# Patient Record
Sex: Female | Born: 1935 | Race: White | Hispanic: No | Smoking: Former smoker
Health system: Southern US, Community
[De-identification: ages and names within clinical notes are randomized; demographics above are authoritative.]

## PROBLEM LIST (undated history)

## (undated) DIAGNOSIS — E785 Hyperlipidemia, unspecified: Secondary | ICD-10-CM

## (undated) DIAGNOSIS — Z87448 Personal history of other diseases of urinary system: Secondary | ICD-10-CM

## (undated) DIAGNOSIS — C3490 Malignant neoplasm of unspecified part of unspecified bronchus or lung: Secondary | ICD-10-CM

## (undated) DIAGNOSIS — Z86010 Personal history of colonic polyps: Secondary | ICD-10-CM

## (undated) DIAGNOSIS — J449 Chronic obstructive pulmonary disease, unspecified: Secondary | ICD-10-CM

## (undated) DIAGNOSIS — M1990 Unspecified osteoarthritis, unspecified site: Secondary | ICD-10-CM

## (undated) DIAGNOSIS — I10 Essential (primary) hypertension: Secondary | ICD-10-CM

## (undated) DIAGNOSIS — S62102A Fracture of unspecified carpal bone, left wrist, initial encounter for closed fracture: Secondary | ICD-10-CM

## (undated) DIAGNOSIS — E890 Postprocedural hypothyroidism: Secondary | ICD-10-CM

## (undated) DIAGNOSIS — Z923 Personal history of irradiation: Secondary | ICD-10-CM

## (undated) DIAGNOSIS — Z780 Asymptomatic menopausal state: Secondary | ICD-10-CM

## (undated) DIAGNOSIS — C50919 Malignant neoplasm of unspecified site of unspecified female breast: Secondary | ICD-10-CM

## (undated) DIAGNOSIS — M810 Age-related osteoporosis without current pathological fracture: Secondary | ICD-10-CM

## (undated) HISTORY — DX: Essential (primary) hypertension: I10

## (undated) HISTORY — DX: Hyperlipidemia, unspecified: E78.5

## (undated) HISTORY — DX: Personal history of colonic polyps: Z86.010

## (undated) HISTORY — DX: Personal history of irradiation: Z92.3

## (undated) HISTORY — DX: Unspecified osteoarthritis, unspecified site: M19.90

## (undated) HISTORY — DX: Age-related osteoporosis without current pathological fracture: M81.0

## (undated) HISTORY — DX: Fracture of unspecified carpal bone, left wrist, initial encounter for closed fracture: S62.102A

## (undated) HISTORY — DX: Postprocedural hypothyroidism: E89.0

## (undated) HISTORY — DX: Malignant neoplasm of unspecified site of unspecified female breast: C50.919

## (undated) HISTORY — DX: Malignant neoplasm of unspecified part of unspecified bronchus or lung: C34.90

## (undated) HISTORY — PX: BREAST SURGERY: SHX581

## (undated) HISTORY — DX: Chronic obstructive pulmonary disease, unspecified: J44.9

## (undated) HISTORY — PX: EYE SURGERY: SHX253

## (undated) HISTORY — DX: Asymptomatic menopausal state: Z78.0

## (undated) HISTORY — DX: Personal history of other diseases of urinary system: Z87.448

---
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
## 1989-08-03 DIAGNOSIS — C50919 Malignant neoplasm of unspecified site of unspecified female breast: Secondary | ICD-10-CM

## 1989-08-03 HISTORY — PX: MASTECTOMY: SHX3

## 1989-08-03 HISTORY — DX: Malignant neoplasm of unspecified site of unspecified female breast: C50.919

## 1998-04-12 ENCOUNTER — Ambulatory Visit (HOSPITAL_COMMUNITY): Admission: RE | Admit: 1998-04-12 | Discharge: 1998-04-12 | Payer: Self-pay | Admitting: *Deleted

## 1998-10-02 ENCOUNTER — Other Ambulatory Visit: Admission: RE | Admit: 1998-10-02 | Discharge: 1998-10-02 | Payer: Self-pay | Admitting: *Deleted

## 1998-10-22 ENCOUNTER — Other Ambulatory Visit: Admission: RE | Admit: 1998-10-22 | Discharge: 1998-10-22 | Payer: Self-pay | Admitting: *Deleted

## 1999-04-18 ENCOUNTER — Ambulatory Visit (HOSPITAL_COMMUNITY): Admission: RE | Admit: 1999-04-18 | Discharge: 1999-04-18 | Payer: Self-pay | Admitting: *Deleted

## 1999-04-21 ENCOUNTER — Encounter: Payer: Self-pay | Admitting: *Deleted

## 1999-10-23 ENCOUNTER — Other Ambulatory Visit: Admission: RE | Admit: 1999-10-23 | Discharge: 1999-10-23 | Payer: Self-pay | Admitting: *Deleted

## 2000-03-26 ENCOUNTER — Ambulatory Visit (HOSPITAL_COMMUNITY): Admission: RE | Admit: 2000-03-26 | Discharge: 2000-03-26 | Payer: Self-pay | Admitting: *Deleted

## 2000-03-26 ENCOUNTER — Encounter: Payer: Self-pay | Admitting: *Deleted

## 2000-10-26 ENCOUNTER — Other Ambulatory Visit: Admission: RE | Admit: 2000-10-26 | Discharge: 2000-10-26 | Payer: Self-pay | Admitting: *Deleted

## 2000-11-13 ENCOUNTER — Emergency Department (HOSPITAL_COMMUNITY): Admission: EM | Admit: 2000-11-13 | Discharge: 2000-11-13 | Payer: Self-pay | Admitting: *Deleted

## 2000-11-13 ENCOUNTER — Encounter: Payer: Self-pay | Admitting: General Surgery

## 2000-11-13 ENCOUNTER — Encounter: Payer: Self-pay | Admitting: *Deleted

## 2000-12-03 ENCOUNTER — Other Ambulatory Visit: Admission: RE | Admit: 2000-12-03 | Discharge: 2000-12-03 | Payer: Self-pay | Admitting: Gastroenterology

## 2000-12-03 ENCOUNTER — Encounter (INDEPENDENT_AMBULATORY_CARE_PROVIDER_SITE_OTHER): Payer: Self-pay | Admitting: Specialist

## 2000-12-03 LAB — HM COLONOSCOPY

## 2001-04-01 ENCOUNTER — Ambulatory Visit (HOSPITAL_COMMUNITY): Admission: RE | Admit: 2001-04-01 | Discharge: 2001-04-01 | Payer: Self-pay | Admitting: *Deleted

## 2001-04-01 ENCOUNTER — Encounter: Payer: Self-pay | Admitting: *Deleted

## 2002-04-07 ENCOUNTER — Ambulatory Visit (HOSPITAL_COMMUNITY): Admission: RE | Admit: 2002-04-07 | Discharge: 2002-04-07 | Payer: Self-pay | Admitting: *Deleted

## 2002-04-07 ENCOUNTER — Encounter: Payer: Self-pay | Admitting: *Deleted

## 2003-04-13 ENCOUNTER — Ambulatory Visit (HOSPITAL_COMMUNITY): Admission: RE | Admit: 2003-04-13 | Discharge: 2003-04-13 | Payer: Self-pay | Admitting: Endocrinology

## 2003-04-13 ENCOUNTER — Encounter: Payer: Self-pay | Admitting: Endocrinology

## 2003-05-14 ENCOUNTER — Encounter: Admission: RE | Admit: 2003-05-14 | Discharge: 2003-05-14 | Payer: Self-pay | Admitting: Endocrinology

## 2003-05-14 ENCOUNTER — Encounter: Payer: Self-pay | Admitting: Endocrinology

## 2004-04-18 ENCOUNTER — Ambulatory Visit (HOSPITAL_COMMUNITY): Admission: RE | Admit: 2004-04-18 | Discharge: 2004-04-18 | Payer: Self-pay | Admitting: Endocrinology

## 2005-05-05 ENCOUNTER — Ambulatory Visit: Payer: Self-pay | Admitting: Endocrinology

## 2005-05-14 ENCOUNTER — Ambulatory Visit: Payer: Self-pay | Admitting: Endocrinology

## 2005-05-15 ENCOUNTER — Ambulatory Visit (HOSPITAL_COMMUNITY): Admission: RE | Admit: 2005-05-15 | Discharge: 2005-05-15 | Payer: Self-pay | Admitting: Endocrinology

## 2005-05-21 ENCOUNTER — Ambulatory Visit: Payer: Self-pay | Admitting: Endocrinology

## 2005-05-28 ENCOUNTER — Ambulatory Visit: Payer: Self-pay | Admitting: Endocrinology

## 2006-05-10 ENCOUNTER — Ambulatory Visit: Payer: Self-pay | Admitting: Endocrinology

## 2006-05-21 ENCOUNTER — Ambulatory Visit (HOSPITAL_COMMUNITY): Admission: RE | Admit: 2006-05-21 | Discharge: 2006-05-21 | Payer: Self-pay | Admitting: Endocrinology

## 2007-04-16 ENCOUNTER — Encounter: Payer: Self-pay | Admitting: Endocrinology

## 2007-04-16 DIAGNOSIS — I10 Essential (primary) hypertension: Secondary | ICD-10-CM | POA: Insufficient documentation

## 2007-04-16 DIAGNOSIS — Z860100 Personal history of colon polyps, unspecified: Secondary | ICD-10-CM | POA: Insufficient documentation

## 2007-04-16 DIAGNOSIS — Z87448 Personal history of other diseases of urinary system: Secondary | ICD-10-CM

## 2007-04-16 DIAGNOSIS — Z853 Personal history of malignant neoplasm of breast: Secondary | ICD-10-CM | POA: Insufficient documentation

## 2007-04-16 DIAGNOSIS — E785 Hyperlipidemia, unspecified: Secondary | ICD-10-CM

## 2007-04-16 DIAGNOSIS — E1169 Type 2 diabetes mellitus with other specified complication: Secondary | ICD-10-CM | POA: Insufficient documentation

## 2007-04-16 DIAGNOSIS — E782 Mixed hyperlipidemia: Secondary | ICD-10-CM | POA: Insufficient documentation

## 2007-04-16 HISTORY — DX: Essential (primary) hypertension: I10

## 2007-04-16 HISTORY — DX: Personal history of other diseases of urinary system: Z87.448

## 2007-04-16 HISTORY — DX: Hyperlipidemia, unspecified: E78.5

## 2007-04-16 HISTORY — DX: Personal history of colon polyps, unspecified: Z86.0100

## 2007-04-16 HISTORY — DX: Personal history of colonic polyps: Z86.010

## 2007-05-12 ENCOUNTER — Ambulatory Visit: Payer: Self-pay | Admitting: Endocrinology

## 2007-05-24 ENCOUNTER — Ambulatory Visit (HOSPITAL_COMMUNITY): Admission: RE | Admit: 2007-05-24 | Discharge: 2007-05-24 | Payer: Self-pay | Admitting: Endocrinology

## 2007-06-06 ENCOUNTER — Ambulatory Visit: Payer: Self-pay | Admitting: Family Medicine

## 2007-06-21 ENCOUNTER — Ambulatory Visit: Payer: Self-pay | Admitting: Endocrinology

## 2007-06-21 LAB — CONVERTED CEMR LAB
ALT: 16 units/L (ref 0–35)
AST: 20 units/L (ref 0–37)
Albumin: 4.2 g/dL (ref 3.5–5.2)
Alkaline Phosphatase: 33 units/L — ABNORMAL LOW (ref 39–117)
BUN: 20 mg/dL (ref 6–23)
Bacteria, UA: NEGATIVE
Basophils Absolute: 0.1 10*3/uL (ref 0.0–0.1)
Basophils Relative: 0.8 % (ref 0.0–1.0)
Bilirubin Urine: NEGATIVE
Bilirubin, Direct: 0.1 mg/dL (ref 0.0–0.3)
CO2: 31 meq/L (ref 19–32)
Calcium: 9.7 mg/dL (ref 8.4–10.5)
Chloride: 103 meq/L (ref 96–112)
Cholesterol: 149 mg/dL (ref 0–200)
Creatinine, Ser: 0.7 mg/dL (ref 0.4–1.2)
Crystals: NEGATIVE
Eosinophils Absolute: 0.1 10*3/uL (ref 0.0–0.6)
Eosinophils Relative: 1.7 % (ref 0.0–5.0)
GFR calc Af Amer: 105 mL/min
GFR calc non Af Amer: 87 mL/min
Glucose, Bld: 96 mg/dL (ref 70–99)
HCT: 38.8 % (ref 36.0–46.0)
HDL: 66.4 mg/dL (ref >39.0)
Hemoglobin: 13.5 g/dL (ref 12.0–15.0)
Hgb A1c MFr Bld: 6.1 % — ABNORMAL HIGH (ref 4.6–6.0)
Ketones, ur: NEGATIVE mg/dL
LDL Cholesterol: 65 mg/dL (ref 0–99)
Leukocytes, UA: NEGATIVE
Lymphocytes Relative: 25.2 % (ref 12.0–46.0)
MCHC: 34.7 g/dL (ref 30.0–36.0)
MCV: 94.8 fL (ref 78.0–100.0)
Monocytes Absolute: 0.8 10*3/uL — ABNORMAL HIGH (ref 0.2–0.7)
Monocytes Relative: 9.2 % (ref 3.0–11.0)
Mucus, UA: NEGATIVE
Neutro Abs: 5.4 10*3/uL (ref 1.4–7.7)
Neutrophils Relative %: 63.1 % (ref 43.0–77.0)
Nitrite: NEGATIVE
Platelets: 235 10*3/uL (ref 150–400)
Potassium: 4.5 meq/L (ref 3.5–5.1)
RBC: 4.09 M/uL (ref 3.87–5.11)
RDW: 12.3 % (ref 11.5–14.6)
Sodium: 144 meq/L (ref 135–145)
Specific Gravity, Urine: 1.02 (ref 1.000–1.03)
TSH: 1.08 microintl units/mL (ref 0.35–5.50)
Total Bilirubin: 0.6 mg/dL (ref 0.3–1.2)
Total CHOL/HDL Ratio: 2.2
Total Protein, Urine: NEGATIVE mg/dL
Total Protein: 7.2 g/dL (ref 6.0–8.3)
Triglycerides: 90 mg/dL (ref 0–149)
Urine Glucose: NEGATIVE mg/dL
Urobilinogen, UA: 0.2 (ref 0.0–1.0)
VLDL: 18 mg/dL (ref 0–40)
WBC: 8.6 10*3/uL (ref 4.5–10.5)
pH: 6 (ref 5.0–8.0)

## 2008-01-02 ENCOUNTER — Encounter: Payer: Self-pay | Admitting: Endocrinology

## 2008-04-18 ENCOUNTER — Ambulatory Visit (HOSPITAL_COMMUNITY): Admission: RE | Admit: 2008-04-18 | Discharge: 2008-04-18 | Payer: Self-pay | Admitting: Orthopaedic Surgery

## 2008-04-18 ENCOUNTER — Encounter (INDEPENDENT_AMBULATORY_CARE_PROVIDER_SITE_OTHER): Payer: Self-pay | Admitting: Interventional Radiology

## 2008-05-09 ENCOUNTER — Ambulatory Visit: Payer: Self-pay | Admitting: Thoracic Surgery

## 2008-05-15 ENCOUNTER — Ambulatory Visit: Payer: Self-pay | Admitting: Internal Medicine

## 2008-05-16 ENCOUNTER — Ambulatory Visit (HOSPITAL_COMMUNITY): Admission: RE | Admit: 2008-05-16 | Discharge: 2008-05-16 | Payer: Self-pay | Admitting: Thoracic Surgery

## 2008-05-17 ENCOUNTER — Ambulatory Visit: Payer: Self-pay | Admitting: Thoracic Surgery

## 2008-05-17 ENCOUNTER — Ambulatory Visit: Payer: Self-pay | Admitting: Emergency Medicine

## 2008-05-17 DIAGNOSIS — J449 Chronic obstructive pulmonary disease, unspecified: Secondary | ICD-10-CM

## 2008-05-17 DIAGNOSIS — J4489 Other specified chronic obstructive pulmonary disease: Secondary | ICD-10-CM

## 2008-05-17 HISTORY — DX: Other specified chronic obstructive pulmonary disease: J44.89

## 2008-05-17 HISTORY — DX: Chronic obstructive pulmonary disease, unspecified: J44.9

## 2008-06-07 ENCOUNTER — Ambulatory Visit: Payer: Self-pay | Admitting: Endocrinology

## 2008-06-07 DIAGNOSIS — E890 Postprocedural hypothyroidism: Secondary | ICD-10-CM

## 2008-06-07 HISTORY — DX: Postprocedural hypothyroidism: E89.0

## 2008-06-08 ENCOUNTER — Ambulatory Visit (HOSPITAL_COMMUNITY): Admission: RE | Admit: 2008-06-08 | Discharge: 2008-06-08 | Payer: Self-pay | Admitting: Endocrinology

## 2008-06-08 LAB — HM MAMMOGRAPHY: HM Mammogram: NEGATIVE

## 2008-06-10 LAB — CONVERTED CEMR LAB
ALT: 15 units/L (ref 0–35)
AST: 23 units/L (ref 0–37)
Albumin: 4.1 g/dL (ref 3.5–5.2)
Alkaline Phosphatase: 39 units/L (ref 39–117)
BUN: 13 mg/dL (ref 6–23)
Bacteria, UA: NEGATIVE
Basophils Absolute: 0 10*3/uL (ref 0.0–0.1)
Basophils Relative: 0.6 % (ref 0.0–3.0)
Bilirubin Urine: NEGATIVE
Bilirubin, Direct: 0.1 mg/dL (ref 0.0–0.3)
CO2: 32 meq/L (ref 19–32)
Calcium: 9.9 mg/dL (ref 8.4–10.5)
Chloride: 104 meq/L (ref 96–112)
Cholesterol: 134 mg/dL (ref 0–200)
Creatinine, Ser: 0.7 mg/dL (ref 0.4–1.2)
Creatinine,U: 29.4 mg/dL
Crystals: NEGATIVE
Eosinophils Absolute: 0.1 10*3/uL (ref 0.0–0.7)
Eosinophils Relative: 1.8 % (ref 0.0–5.0)
GFR calc Af Amer: 105 mL/min
GFR calc non Af Amer: 87 mL/min
Glucose, Bld: 87 mg/dL (ref 70–99)
HCT: 35.2 % — ABNORMAL LOW (ref 36.0–46.0)
HDL: 67.8 mg/dL (ref >39.0)
Hemoglobin, Urine: NEGATIVE
Hemoglobin: 12 g/dL (ref 12.0–15.0)
Hgb A1c MFr Bld: 6.1 % — ABNORMAL HIGH (ref 4.6–6.0)
Ketones, ur: NEGATIVE mg/dL
LDL Cholesterol: 47 mg/dL (ref 0–99)
Leukocytes, UA: NEGATIVE
Lymphocytes Relative: 31 % (ref 12.0–46.0)
MCHC: 34 g/dL (ref 30.0–36.0)
MCV: 96.1 fL (ref 78.0–100.0)
Microalb Creat Ratio: 34 mg/g — ABNORMAL HIGH (ref 0.0–30.0)
Microalb, Ur: 1 mg/dL (ref 0.0–1.9)
Monocytes Absolute: 0.7 10*3/uL (ref 0.1–1.0)
Monocytes Relative: 9.4 % (ref 3.0–12.0)
Mucus, UA: NEGATIVE
Neutro Abs: 4.6 10*3/uL (ref 1.4–7.7)
Neutrophils Relative %: 57.2 % (ref 43.0–77.0)
Nitrite: NEGATIVE
Platelets: 215 10*3/uL (ref 150–400)
Potassium: 4.3 meq/L (ref 3.5–5.1)
RBC / HPF: NONE SEEN
RBC: 3.66 M/uL — ABNORMAL LOW (ref 3.87–5.11)
RDW: 13.8 % (ref 11.5–14.6)
Sodium: 143 meq/L (ref 135–145)
Specific Gravity, Urine: <=1.005 (ref 1.000–1.03)
TSH: 1.14 microintl units/mL (ref 0.35–5.50)
Total Bilirubin: 0.5 mg/dL (ref 0.3–1.2)
Total CHOL/HDL Ratio: 2
Total Protein, Urine: NEGATIVE mg/dL
Total Protein: 7.1 g/dL (ref 6.0–8.3)
Triglycerides: 98 mg/dL (ref 0–149)
Urine Glucose: NEGATIVE mg/dL
Urobilinogen, UA: 0.2 (ref 0.0–1.0)
VLDL: 20 mg/dL (ref 0–40)
WBC: 7.8 10*3/uL (ref 4.5–10.5)
pH: 6 (ref 5.0–8.0)

## 2008-07-24 ENCOUNTER — Ambulatory Visit: Payer: Self-pay | Admitting: Gastroenterology

## 2008-08-08 ENCOUNTER — Ambulatory Visit: Payer: Self-pay | Admitting: Gastroenterology

## 2008-08-08 ENCOUNTER — Encounter: Payer: Self-pay | Admitting: Gastroenterology

## 2008-08-08 LAB — HM COLONOSCOPY

## 2008-08-15 ENCOUNTER — Encounter: Admission: RE | Admit: 2008-08-15 | Discharge: 2008-08-15 | Payer: Self-pay | Admitting: Thoracic Surgery

## 2008-08-15 ENCOUNTER — Ambulatory Visit: Payer: Self-pay | Admitting: Thoracic Surgery

## 2008-08-15 ENCOUNTER — Encounter: Payer: Self-pay | Admitting: Gastroenterology

## 2008-08-22 ENCOUNTER — Encounter: Payer: Self-pay | Admitting: Endocrinology

## 2008-08-22 ENCOUNTER — Encounter: Admission: RE | Admit: 2008-08-22 | Discharge: 2008-08-22 | Payer: Self-pay | Admitting: Neurosurgery

## 2008-11-13 ENCOUNTER — Telehealth: Payer: Self-pay | Admitting: Endocrinology

## 2008-12-11 ENCOUNTER — Ambulatory Visit: Payer: Self-pay | Admitting: Thoracic Surgery

## 2008-12-11 ENCOUNTER — Encounter: Admission: RE | Admit: 2008-12-11 | Discharge: 2008-12-11 | Payer: Self-pay | Admitting: Thoracic Surgery

## 2009-02-15 ENCOUNTER — Ambulatory Visit: Payer: Self-pay | Admitting: Internal Medicine

## 2009-02-15 DIAGNOSIS — R609 Edema, unspecified: Secondary | ICD-10-CM | POA: Insufficient documentation

## 2009-02-18 ENCOUNTER — Ambulatory Visit: Payer: Self-pay | Admitting: Internal Medicine

## 2009-02-18 LAB — CONVERTED CEMR LAB
ALT: 14 units/L (ref 0–35)
AST: 20 units/L (ref 0–37)
Albumin: 4.1 g/dL (ref 3.5–5.2)
Alkaline Phosphatase: 37 units/L — ABNORMAL LOW (ref 39–117)
BUN: 17 mg/dL (ref 6–23)
Basophils Absolute: 0 10*3/uL (ref 0.0–0.1)
Basophils Relative: 0.4 % (ref 0.0–3.0)
Bilirubin Urine: NEGATIVE
Bilirubin, Direct: 0.1 mg/dL (ref 0.0–0.3)
CO2: 33 meq/L — ABNORMAL HIGH (ref 19–32)
Calcium: 10 mg/dL (ref 8.4–10.5)
Chloride: 103 meq/L (ref 96–112)
Creatinine, Ser: 0.8 mg/dL (ref 0.4–1.2)
Eosinophils Absolute: 0.1 10*3/uL (ref 0.0–0.7)
Eosinophils Relative: 1.8 % (ref 0.0–5.0)
GFR calc non Af Amer: 74.32 mL/min (ref >60)
Glucose, Bld: 99 mg/dL (ref 70–99)
HCT: 37.5 % (ref 36.0–46.0)
Hemoglobin: 12.6 g/dL (ref 12.0–15.0)
Ketones, ur: NEGATIVE mg/dL
Leukocytes, UA: NEGATIVE
Lymphocytes Relative: 30.8 % (ref 12.0–46.0)
Lymphs Abs: 2.2 10*3/uL (ref 0.7–4.0)
MCHC: 33.6 g/dL (ref 30.0–36.0)
MCV: 95.3 fL (ref 78.0–100.0)
Monocytes Absolute: 0.7 10*3/uL (ref 0.1–1.0)
Monocytes Relative: 10.3 % (ref 3.0–12.0)
Neutro Abs: 4 10*3/uL (ref 1.4–7.7)
Neutrophils Relative %: 56.7 % (ref 43.0–77.0)
Nitrite: NEGATIVE
Platelets: 210 10*3/uL (ref 150.0–400.0)
Potassium: 4.4 meq/L (ref 3.5–5.1)
RBC: 3.93 M/uL (ref 3.87–5.11)
RDW: 12.8 % (ref 11.5–14.6)
Sodium: 142 meq/L (ref 135–145)
Specific Gravity, Urine: 1.015 (ref 1.000–1.030)
TSH: 0.94 microintl units/mL (ref 0.35–5.50)
Total Bilirubin: 0.7 mg/dL (ref 0.3–1.2)
Total Protein, Urine: NEGATIVE mg/dL
Total Protein: 7.5 g/dL (ref 6.0–8.3)
Urine Glucose: NEGATIVE mg/dL
Urobilinogen, UA: 0.2 (ref 0.0–1.0)
WBC: 7 10*3/uL (ref 4.5–10.5)
pH: 5 (ref 5.0–8.0)

## 2009-05-01 ENCOUNTER — Ambulatory Visit: Payer: Self-pay | Admitting: Thoracic Surgery

## 2009-05-01 ENCOUNTER — Encounter: Admission: RE | Admit: 2009-05-01 | Discharge: 2009-05-01 | Payer: Self-pay | Admitting: Thoracic Surgery

## 2009-05-01 ENCOUNTER — Encounter: Payer: Self-pay | Admitting: Endocrinology

## 2009-05-02 ENCOUNTER — Ambulatory Visit: Admission: RE | Admit: 2009-05-02 | Discharge: 2009-05-02 | Payer: Self-pay | Admitting: Thoracic Surgery

## 2009-05-22 ENCOUNTER — Encounter (INDEPENDENT_AMBULATORY_CARE_PROVIDER_SITE_OTHER): Payer: Self-pay | Admitting: Interventional Radiology

## 2009-05-22 ENCOUNTER — Ambulatory Visit (HOSPITAL_COMMUNITY): Admission: RE | Admit: 2009-05-22 | Discharge: 2009-05-22 | Payer: Self-pay | Admitting: Thoracic Surgery

## 2009-05-22 HISTORY — PX: LUNG BIOPSY: SHX232

## 2009-06-14 ENCOUNTER — Ambulatory Visit (HOSPITAL_COMMUNITY): Admission: RE | Admit: 2009-06-14 | Discharge: 2009-06-14 | Payer: Self-pay | Admitting: Endocrinology

## 2009-06-14 LAB — HM MAMMOGRAPHY: HM Mammogram: NEGATIVE

## 2009-06-20 ENCOUNTER — Ambulatory Visit: Payer: Self-pay | Admitting: Thoracic Surgery

## 2009-07-02 ENCOUNTER — Ambulatory Visit: Payer: Self-pay | Admitting: Endocrinology

## 2009-07-02 DIAGNOSIS — Z780 Asymptomatic menopausal state: Secondary | ICD-10-CM | POA: Insufficient documentation

## 2009-07-02 DIAGNOSIS — M810 Age-related osteoporosis without current pathological fracture: Secondary | ICD-10-CM | POA: Insufficient documentation

## 2009-07-02 HISTORY — DX: Asymptomatic menopausal state: Z78.0

## 2009-07-02 HISTORY — DX: Age-related osteoporosis without current pathological fracture: M81.0

## 2009-07-02 LAB — CONVERTED CEMR LAB
Creatinine,U: 129.9 mg/dL
Hgb A1c MFr Bld: 6.1 % (ref 4.6–6.5)
Microalb Creat Ratio: 13.1 mg/g (ref 0.0–30.0)
Microalb, Ur: 1.7 mg/dL (ref 0.0–1.9)

## 2009-07-03 ENCOUNTER — Ambulatory Visit: Payer: Self-pay | Admitting: Endocrinology

## 2009-07-05 ENCOUNTER — Ambulatory Visit (HOSPITAL_COMMUNITY): Admission: RE | Admit: 2009-07-05 | Discharge: 2009-07-05 | Payer: Self-pay | Admitting: Radiation Oncology

## 2009-07-10 ENCOUNTER — Ambulatory Visit: Admission: RE | Admit: 2009-07-10 | Discharge: 2009-08-02 | Payer: Self-pay | Admitting: Radiation Oncology

## 2009-08-04 ENCOUNTER — Ambulatory Visit: Admission: RE | Admit: 2009-08-04 | Discharge: 2009-08-16 | Payer: Self-pay | Admitting: Radiation Oncology

## 2009-10-30 ENCOUNTER — Ambulatory Visit: Payer: Self-pay | Admitting: Internal Medicine

## 2009-11-01 ENCOUNTER — Ambulatory Visit: Admission: RE | Admit: 2009-11-01 | Discharge: 2009-11-01 | Payer: Self-pay | Admitting: Radiation Oncology

## 2009-11-01 LAB — CREATININE, SERUM: Creatinine, Ser: 0.93 mg/dL (ref 0.40–1.20)

## 2009-11-01 LAB — BUN: BUN: 19 mg/dL (ref 6–23)

## 2009-11-11 ENCOUNTER — Ambulatory Visit (HOSPITAL_COMMUNITY): Admission: RE | Admit: 2009-11-11 | Discharge: 2009-11-11 | Payer: Self-pay | Admitting: Radiation Oncology

## 2009-11-13 ENCOUNTER — Encounter: Payer: Self-pay | Admitting: Endocrinology

## 2009-11-14 ENCOUNTER — Telehealth (INDEPENDENT_AMBULATORY_CARE_PROVIDER_SITE_OTHER): Payer: Self-pay | Admitting: *Deleted

## 2010-02-06 ENCOUNTER — Ambulatory Visit: Payer: Self-pay | Admitting: Internal Medicine

## 2010-03-24 ENCOUNTER — Ambulatory Visit: Admission: RE | Admit: 2010-03-24 | Discharge: 2010-03-24 | Payer: Self-pay | Admitting: Radiation Oncology

## 2010-03-24 ENCOUNTER — Ambulatory Visit: Payer: Self-pay | Admitting: Internal Medicine

## 2010-03-24 LAB — CREATININE, SERUM: Creatinine, Ser: 0.87 mg/dL (ref 0.40–1.20)

## 2010-03-24 LAB — BUN: BUN: 18 mg/dL (ref 6–23)

## 2010-03-26 ENCOUNTER — Ambulatory Visit (HOSPITAL_COMMUNITY): Admission: RE | Admit: 2010-03-26 | Discharge: 2010-03-26 | Payer: Self-pay | Admitting: Radiation Oncology

## 2010-03-26 ENCOUNTER — Encounter: Payer: Self-pay | Admitting: Endocrinology

## 2010-06-20 ENCOUNTER — Ambulatory Visit (HOSPITAL_COMMUNITY)
Admission: RE | Admit: 2010-06-20 | Discharge: 2010-06-20 | Payer: Self-pay | Source: Home / Self Care | Admitting: Endocrinology

## 2010-06-20 LAB — HM MAMMOGRAPHY

## 2010-07-03 ENCOUNTER — Ambulatory Visit: Payer: Self-pay | Admitting: Endocrinology

## 2010-07-03 ENCOUNTER — Encounter: Payer: Self-pay | Admitting: Endocrinology

## 2010-07-03 DIAGNOSIS — Z87891 Personal history of nicotine dependence: Secondary | ICD-10-CM | POA: Insufficient documentation

## 2010-07-03 DIAGNOSIS — C3490 Malignant neoplasm of unspecified part of unspecified bronchus or lung: Secondary | ICD-10-CM

## 2010-07-03 HISTORY — DX: Malignant neoplasm of unspecified part of unspecified bronchus or lung: C34.90

## 2010-07-03 LAB — CONVERTED CEMR LAB
ALT: 11 units/L (ref 0–35)
AST: 19 units/L (ref 0–37)
Albumin: 4.2 g/dL (ref 3.5–5.2)
Alkaline Phosphatase: 53 units/L (ref 39–117)
BUN: 20 mg/dL (ref 6–23)
Basophils Absolute: 0 10*3/uL (ref 0.0–0.1)
Basophils Relative: 0.3 % (ref 0.0–3.0)
Bilirubin Urine: NEGATIVE
Bilirubin, Direct: 0.1 mg/dL (ref 0.0–0.3)
CO2: 29 meq/L (ref 19–32)
Calcium, Total (PTH): 9.7 mg/dL (ref 8.4–10.5)
Calcium: 9.6 mg/dL (ref 8.4–10.5)
Chloride: 100 meq/L (ref 96–112)
Cholesterol: 268 mg/dL — ABNORMAL HIGH (ref 0–200)
Creatinine, Ser: 0.8 mg/dL (ref 0.4–1.2)
Creatinine,U: 147.2 mg/dL
Direct LDL: 179.2 mg/dL
Eosinophils Absolute: 0.1 10*3/uL (ref 0.0–0.7)
Eosinophils Relative: 1.6 % (ref 0.0–5.0)
GFR calc non Af Amer: 72.99 mL/min (ref >60)
Glucose, Bld: 96 mg/dL (ref 70–99)
HCT: 38 % (ref 36.0–46.0)
HDL: 62.1 mg/dL (ref >39.00)
Hemoglobin: 13.2 g/dL (ref 12.0–15.0)
Hgb A1c MFr Bld: 6 % (ref 4.6–6.5)
Ketones, ur: NEGATIVE mg/dL
Leukocytes, UA: NEGATIVE
Lymphocytes Relative: 23.3 % (ref 12.0–46.0)
Lymphs Abs: 1.4 10*3/uL (ref 0.7–4.0)
MCHC: 34.7 g/dL (ref 30.0–36.0)
MCV: 94.6 fL (ref 78.0–100.0)
Microalb Creat Ratio: 1.5 mg/g (ref 0.0–30.0)
Microalb, Ur: 2.2 mg/dL — ABNORMAL HIGH (ref 0.0–1.9)
Monocytes Absolute: 0.6 10*3/uL (ref 0.1–1.0)
Monocytes Relative: 9.7 % (ref 3.0–12.0)
Neutro Abs: 3.9 10*3/uL (ref 1.4–7.7)
Neutrophils Relative %: 65.1 % (ref 43.0–77.0)
Nitrite: NEGATIVE
PTH: 62.5 pg/mL (ref 14.0–72.0)
Platelets: 218 10*3/uL (ref 150.0–400.0)
Potassium: 4.1 meq/L (ref 3.5–5.1)
RBC: 4.02 M/uL (ref 3.87–5.11)
RDW: 14.5 % (ref 11.5–14.6)
Sodium: 139 meq/L (ref 135–145)
Specific Gravity, Urine: 1.025 (ref 1.000–1.030)
TSH: 1.37 microintl units/mL (ref 0.35–5.50)
Total Bilirubin: 0.3 mg/dL (ref 0.3–1.2)
Total CHOL/HDL Ratio: 4
Total Protein, Urine: NEGATIVE mg/dL
Total Protein: 7.3 g/dL (ref 6.0–8.3)
Triglycerides: 118 mg/dL (ref 0.0–149.0)
Urine Glucose: NEGATIVE mg/dL
Urobilinogen, UA: 0.2 (ref 0.0–1.0)
VLDL: 23.6 mg/dL (ref 0.0–40.0)
WBC: 5.9 10*3/uL (ref 4.5–10.5)
pH: 5.5 (ref 5.0–8.0)

## 2010-07-29 ENCOUNTER — Ambulatory Visit
Admission: RE | Admit: 2010-07-29 | Discharge: 2010-07-29 | Payer: Self-pay | Source: Home / Self Care | Attending: Radiation Oncology | Admitting: Radiation Oncology

## 2010-07-29 ENCOUNTER — Ambulatory Visit: Payer: Self-pay | Admitting: Internal Medicine

## 2010-07-30 ENCOUNTER — Encounter: Payer: Self-pay | Admitting: Endocrinology

## 2010-07-30 ENCOUNTER — Ambulatory Visit (HOSPITAL_COMMUNITY)
Admission: RE | Admit: 2010-07-30 | Discharge: 2010-07-30 | Payer: Self-pay | Source: Home / Self Care | Attending: Radiation Oncology | Admitting: Radiation Oncology

## 2010-08-01 ENCOUNTER — Encounter: Payer: Self-pay | Admitting: Endocrinology

## 2010-08-23 ENCOUNTER — Other Ambulatory Visit: Payer: Self-pay | Admitting: Radiation Oncology

## 2010-08-23 DIAGNOSIS — Z85118 Personal history of other malignant neoplasm of bronchus and lung: Secondary | ICD-10-CM

## 2010-08-24 ENCOUNTER — Encounter: Payer: Self-pay | Admitting: Radiation Oncology

## 2010-09-02 NOTE — Assessment & Plan Note (Signed)
Summary: lung nodule   Visit Type:  Initial Consult  Chief Complaint:  R UL mass.  History of Present Illness: 77 yo smoker with hx breast CA, COPD, DM, HTN. presented to PCP with cough and sputum. Treated with abx and imaged - CT chest showed RUL nodule and another distinct area of GGI. Needle bx of the former showed fibrotic stroma with glandular cells, ? malignancy. She presents now for further eval, plans for definitive dx, therapy. Seen in [facility] with Dr Edwyna Shell.   ROS: able to get out, walk, do household activities. No SOB, no cough, no wheeze.      Current Allergies: No known allergies   Past Medical History:    Reviewed history from 04/16/2007 and no changes required:       Breast cancer, hx of       Colonic polyps, hx of       Hyperlipidemia       Hypertension       Hypothyroidism       DM       OA         Past Surgical History:    Reviewed history from 04/16/2007 and no changes required:       (R) Mastectomy (1991)   Family History:    CAD, CVD, HTN  Social History:    Worked Public librarian, retired now.     No other exposures, no TB hx   Risk Factors:  Tobacco use:  current    Year started:  1960    Cigarettes:  Yes -- 0.5 pack(s) per day     Physical Exam  General:     well developed, well nourished, in no acute distress Head:     normocephalic and atraumatic Nose:     no deformity, discharge, inflammation, or lesions Mouth:     no deformity or lesions Neck:     no masses, thyromegaly, or abnormal cervical nodes Chest Wall:     no deformities noted Lungs:     clear bilaterally to auscultation and percussion Heart:     regular rate and rhythm, S1, S2 without murmurs, rubs, gallops, or clicks Abdomen:     bowel sounds positive; abdomen soft and non-tender without masses, or organomegaly Msk:     no deformity or scoliosis noted with normal posture Pulses:     pulses normal Extremities:     no clubbing, cyanosis, edema, or deformity  noted Neurologic:     CN II-XII grossly intact with normal reflexes, coordination, muscle strength and tone Skin:     intact without lesions or rashes Cervical Nodes:     no significant adenopathy Axillary Nodes:     no significant adenopathy Psych:     alert and cooperative; normal mood and affect; normal attention span and concentration    Pulmonary Function Test Date: 05/16/2008 Gender: Female  Pre-Spirometry FVC    Value: 1.58 L/min   Pred: 2.80 L/min     % Pred: 56 % FEV1    Value: 0.71 L     Pred: 2.21 L     % Pred: 32 % FEV1/FVC  Value: 45 %     Pred: 81 %     % Pred: - % FEF 25-75  Value: 0.20 L/min   Pred: 1.82 L/min     % Pred: 11 %  Post-Spirometry FVC    Value: 2.08 L/min   Pred: 2.80 L/min     % Pred: 74 % FEV1  Value: 0.82 L     Pred: 2.21 L     % Pred: 37 % FEV1/FVC  Value: 40 %     Pred: 81 %     % Pred: - % FEF 25-75  Value: 0.2 L/min   Pred: 1.82 L/min     % Pred: 11 %  Lung Volumes TLC    Value: 5.21 L   % Pred: 101 % RV    Value: 3.63 L   % Pred: 170 % DLCO    Value: 11.6 %   % Pred: 64 % DLCO/VA  Value: 3.85 %   % Pred: 110 %  Comments: Severe AFL with positive BD response. Hyperinflated volumes. Decreased DLCO that corrects for alveolar volume. RSB    Problem # 1:  PULMONARY NODULE, RIGHT UPPER LOBE (ICD-518.89) Will plan to have neurosurgical eval of enhancing lesion brain, then move towards surgical bx of RUL lesion. Sh ehas severe COPD but functional status is good - Sh e can likely tolerate after addressing tobacco and starting BD's Orders: Consultation Level IV (47829)   Problem # 2:  AODM (ICD-250.00) Spiriva + as needed SABA tobacco cessation Her updated medication list for this problem includes:    Metformin Hcl 500 Mg Tb24 (Metformin hcl) .Marland Kitchen... Take 1 by mouth qd    Adult Aspirin Low Strength 81 Mg Tbdp (Aspirin) .Marland Kitchen... Take 1 by mouth qd   Medications Added to Medication List This Visit: 1)  Spiriva Handihaler 18 Mcg Caps  (Tiotropium bromide monohydrate) .Marland Kitchen.. 1 inhalation daily   Patient Instructions: 1)  We discussed strategies for smoking cessation. Please set your quit date and stop as we discussed. Call Dr Kavin Leech office if you restart or if you need help stopping - [phone].  2)  We will start Spiriva 1 inhalation daily.  3)  Continue to use your rescue inhaler as needed. 4)  Dr Edwyna Shell will refer you to se Dr Dutch Quint with neurosurgery.  5)  We will repeat your CT scan of the chest after your neurological evaluation.    Prescriptions: SPIRIVA HANDIHALER 18 MCG CAPS (TIOTROPIUM BROMIDE MONOHYDRATE) 1 inhalation daily  #30 x 5   Entered and Authorized by:   Leslye Peer MD   Signed by:   Leslye Peer MD on 05/17/2008   Method used:   [facility] [address]  [facility]       [address]       [place], [place]  [address]       Ph: [phone] or [phone]       Fax: [phone]   RxID:   [id number]  ]

## 2010-09-02 NOTE — Assessment & Plan Note (Signed)
Summary: FEET SWOLLEN--X 2 WKS--$50--DR SAE PT-[facility]C   Vital Signs:  Patient profile:   77 year old female Height:      66 inches Weight:      151.25 pounds BMI:     24.50 O2 Sat:      92 % on Room air Temp:     98.7 degrees F oral Pulse rate:   76 / minute BP sitting:   138 / 80  (left arm) Cuff size:   regular  Vitals Entered By: Windell Norfolk (February 15, 2009 4:57 PM)  O2 Flow:  Room air CC: feet swelling and discomfort Comments Add Calcium 6oomg two times a day  Add Vitamin D 1,000mg  one daily    CC:  feet swelling and discomfort.  History of Present Illness: here with the above gradually worse for approx 3 days; with significant discomfort to both feel a bit more on the left than right due to the sweling that just seemed to come on but itself, goes down some at night, not assoc with arthritis or trauma or fever or rash; has not had this problem in the past; no leg swelling - just the feet and ankles; no CP, wheeezing, othopnea, pnd or cough.  No known hx of significant CV dz, pulm dz, renal, liver or anemia.; denies new or changes meds; denies forced by mouth fluids recent  Problems Prior to Update: 1)  Hypothyroidism, Post-radiation  (ICD-244.1) 2)  COPD  (ICD-496) 3)  Pulmonary Nodule, Right Upper Lobe  (ICD-518.89) 4)  Aodm  (ICD-250.00) 5)  Health Maintenance Exam  (ICD-V70.0) 6)  Hematuria, Hx of  (ICD-V13.09) 7)  Smoker  (ICD-305.1) 8)  Hypertension  (ICD-401.9) 9)  Hyperlipidemia  (ICD-272.4) 10)  Colonic Polyps, Hx of  (ICD-V12.72) 11)  Breast Cancer, Hx of  (ICD-V10.3)  Medications Prior to Update: 1)  Lisinopril 40 Mg  Tabs (Lisinopril) .... Take 1 By Mouth Qd 2)  Procardia Xl 30 Mg  Tb24 (Nifedipine) .... Take 1 By Mouth Qd 3)  Synthroid 50 Mcg  Tabs (Levothyroxine Sodium) .... Take 1 By Mouth Qd 4)  Atenolol 25 Mg  Tabs (Atenolol) .... Take 1 By Mouth Two Times A Day Qd 5)  Metformin Hcl 500 Mg  Tb24 (Metformin Hcl) .... Take 1 By Mouth Qd 6)  Adult Aspirin  Low Strength 81 Mg  Tbdp (Aspirin) .... Take 1 By Mouth Qd 7)  Crestor 20 Mg  Tabs (Rosuvastatin Calcium) .... Take 1 By Mouth Qd 8)  Spiriva Handihaler 18 Mcg Caps (Tiotropium Bromide Monohydrate) .Marland Kitchen.. 1 Inhalation Daily 9)  Fosamax 70 Mg Tabs (Alendronate Sodium) .... Take 1 Q Week  Current Medications (verified): 1)  Lisinopril 40 Mg  Tabs (Lisinopril) .... Take 1 By Mouth Qd 2)  Procardia Xl 30 Mg  Tb24 (Nifedipine) .... Take 1 By Mouth Qd 3)  Synthroid 50 Mcg  Tabs (Levothyroxine Sodium) .... Take 1 By Mouth Qd 4)  Atenolol 25 Mg  Tabs (Atenolol) .... Take 1 By Mouth Two Times A Day Qd 5)  Metformin Hcl 500 Mg  Tb24 (Metformin Hcl) .... Take 1 By Mouth Qd 6)  Adult Aspirin Low Strength 81 Mg  Tbdp (Aspirin) .... Take 1 By Mouth Qd 7)  Crestor 20 Mg  Tabs (Rosuvastatin Calcium) .... Take 1 By Mouth Qd 8)  Spiriva Handihaler 18 Mcg Caps (Tiotropium Bromide Monohydrate) .Marland Kitchen.. 1 Inhalation Daily 9)  Fosamax 70 Mg Tabs (Alendronate Sodium) .... Take 1 Q Week 10)  Hydrochlorothiazide 25 Mg  Tabs (Hydrochlorothiazide) .... 1/ 2 - 1 By Mouth Once Daily  Allergies (verified): No Known Drug Allergies  Past History:  Past Medical History: Last updated: 05/17/2008 Breast cancer, hx of Colonic polyps, hx of Hyperlipidemia Hypertension Hypothyroidism DM OA  Past Surgical History: Last updated: 04/16/2007 (R) Mastectomy (1991)  Social History: Last updated: 06/07/2008 Worked Public librarian, retired now.  No other exposures, no TB hx widowed 2005  Risk Factors: Smoking Status: current (05/17/2008) Packs/Day: 0.5 (05/17/2008)  Review of Systems       all otherwise negative   Physical Exam  General:  alert and well-developed.   Head:  normocephalic and atraumatic.   Eyes:  vision grossly intact, pupils equal, and pupils round.   Ears:  R ear normal and L ear normal.   Nose:  no external deformity and no nasal discharge.   Mouth:  no gingival abnormalities and pharynx pink  and moist.   Neck:  supple and no masses.   Lungs:  normal respiratory effort and normal breath sounds.   Heart:  normal rate, regular rhythm, no murmur, and no gallop.   Abdomen:  soft, non-tender, and normal bowel sounds.   Extremities:  trace to 1+ edema bialt feet and ankles - left more than right, without erythema or ulcers o/w neurovasc intact   Impression & Recommendations:  Problem # 1:  PERIPHERAL EDEMA (ICD-782.3)  Her updated medication list for this problem includes:    Hydrochlorothiazide 25 Mg Tabs (Hydrochlorothiazide) .Marland Kitchen... 1/ 2 - 1 by mouth once daily treat as above, f/u any worsening signs or symptoms , to check routine labs including cbc, bmet, TSH, lft's; f/u primary MD to condier furgher CV w/u such as echo to f/o diast CHF  Problem # 2:  HYPERTENSION (ICD-401.9)  Her updated medication list for this problem includes:    Lisinopril 40 Mg Tabs (Lisinopril) .Marland Kitchen... Take 1 by mouth qd    Procardia Xl 30 Mg Tb24 (Nifedipine) .Marland Kitchen... Take 1 by mouth qd    Atenolol 25 Mg Tabs (Atenolol) .Marland Kitchen... Take 1 by mouth two times a day qd    Hydrochlorothiazide 25 Mg Tabs (Hydrochlorothiazide) .Marland Kitchen... 1/ 2 - 1 by mouth once daily  BP today: 138/80 Prior BP: 128/72 (06/07/2008)  Labs Reviewed: K+: 4.3 (06/07/2008) Creat: : 0.7 (06/07/2008)   Chol: 134 (06/07/2008)   HDL: 67.8 (06/07/2008)   LDL: 47 (06/07/2008)   TG: 98 (06/07/2008) stable overall by hx and exam, ok to continue meds/tx as is   Problem # 3:  COPD (ICD-496)  Her updated medication list for this problem includes:    Spiriva Handihaler 18 Mcg Caps (Tiotropium bromide monohydrate) .Marland Kitchen... 1 inhalation daily unclear severity - could be related to periph edema if element of pulm HTN - defer to primary MD  Complete Medication List: 1)  Lisinopril 40 Mg Tabs (Lisinopril) .... Take 1 by mouth qd 2)  Procardia Xl 30 Mg Tb24 (Nifedipine) .... Take 1 by mouth qd 3)  Synthroid 50 Mcg Tabs (Levothyroxine sodium) .... Take 1 by  mouth qd 4)  Atenolol 25 Mg Tabs (Atenolol) .... Take 1 by mouth two times a day qd 5)  Metformin Hcl 500 Mg Tb24 (Metformin hcl) .... Take 1 by mouth qd 6)  Adult Aspirin Low Strength 81 Mg Tbdp (Aspirin) .... Take 1 by mouth qd 7)  Crestor 20 Mg Tabs (Rosuvastatin calcium) .... Take 1 by mouth qd 8)  Spiriva Handihaler 18 Mcg Caps (Tiotropium bromide monohydrate) .Marland Kitchen.. 1 inhalation daily  9)  Fosamax 70 Mg Tabs (Alendronate sodium) .... Take 1 q week 10)  Hydrochlorothiazide 25 Mg Tabs (Hydrochlorothiazide) .... 1/ 2 - 1 by mouth once daily  Patient Instructions: 1)  Please take all new medications as prescribed  2)  Continue all medications that you may have been taking previously  3)  please return Mon July 19 for blood work (it should be in the computer ready for you) 4)  Please schedule an appointment with your primary doctor as he recommended at your last visit: Prescriptions: HYDROCHLOROTHIAZIDE 25 MG TABS (HYDROCHLOROTHIAZIDE) 1/ 2 - 1 by mouth once daily  #100 x 3   Entered and Authorized by:   Corwin Levins MD   Signed by:   Corwin Levins MD on 02/15/2009   Method used:   Print then Give to Patient   RxID:   [id number]

## 2010-09-02 NOTE — Miscellaneous (Signed)
Summary: l-thyroxine,crestor,nifedipine,lisinopril  Medications Added CRESTOR 20 MG  TABS (ROSUVASTATIN CALCIUM) take 1 by mouth qd       Clinical Lists Changes  Medications: Removed medication of VYTORIN 10-20 MG  TABS (EZETIMIBE-SIMVASTATIN) take 1 by mouth qd Added new medication of CRESTOR 20 MG  TABS (ROSUVASTATIN CALCIUM) take 1 by mouth qd - Signed Rx of LISINOPRIL 40 MG  TABS (LISINOPRIL) take 1 by mouth qd;  #90 x 2;  Signed;  Entered by: Orlan Leavens;  Authorized by: Minus Breeding MD;  Method used: Faxed to [facility], , , [place]  , Ph: , Fax: [phone] Rx of PROCARDIA XL 30 MG  TB24 (NIFEDIPINE) take 1 by mouth qd;  #90 x 2;  Signed;  Entered by: Orlan Leavens;  Authorized by: Minus Breeding MD;  Method used: Faxed to [facility], , , [place]  , Ph: , Fax: [phone] Rx of SYNTHROID 50 MCG  TABS (LEVOTHYROXINE SODIUM) take 1 by mouth qd;  #90 x 2;  Signed;  Entered by: Orlan Leavens;  Authorized by: Minus Breeding MD;  Method used: Faxed to [facility], , , [place]  , Ph: , Fax: [phone] Rx of CRESTOR 20 MG  TABS (ROSUVASTATIN CALCIUM) take 1 by mouth qd;  #90 x 2;  Signed;  Entered by: Orlan Leavens;  Authorized by: Minus Breeding MD;  Method used: Faxed to [facility], , , [place]  , Ph: , Fax: [phone]    Prescriptions: CRESTOR 20 MG  TABS (ROSUVASTATIN CALCIUM) take 1 by mouth qd  #90 x 2   Entered by:   Orlan Leavens   Authorized by:   Minus Breeding MD   Signed by:   Orlan Leavens on 01/02/2008   Method used:   Faxed to ...       [facility]             , [place]         Ph:        Fax: [phone]   RxID:   [id number] SYNTHROID 50 MCG  TABS (LEVOTHYROXINE SODIUM) take 1 by mouth qd  #90 x 2   Entered by:   Orlan Leavens   Authorized by:   Minus Breeding MD   Signed by:   Orlan Leavens on 01/02/2008   Method used:   Faxed to ...       [facility]             , [place]         Ph:        Fax: [phone]   RxID:   [id number] PROCARDIA XL 30 MG  TB24 (NIFEDIPINE) take 1 by  mouth qd  #90 x 2   Entered by:   Orlan Leavens   Authorized by:   Minus Breeding MD   Signed by:   Orlan Leavens on 01/02/2008   Method used:   Faxed to ...       [facility]             , [place]         Ph:        Fax: [phone]   RxID:   [id number] LISINOPRIL 40 MG  TABS (LISINOPRIL) take 1 by mouth qd  #90 x 2   Entered by:   Orlan Leavens   Authorized by:   Minus Breeding MD   Signed by:   Orlan Leavens on 01/02/2008   Method used:  Faxed to ...       [facility]             , [place]         Ph:        Fax: [phone]   RxID:   [id number]

## 2010-09-02 NOTE — Letter (Signed)
Summary: Outpatient Metformin Instructions / [facility]  Outpatient Metformin Instructions / [facility]   Imported By: Lennie Odor 03/28/2010 14:44:13  _____________________________________________________________________  External Attachment:    Type:   Image     Comment:   External Document

## 2010-09-02 NOTE — Progress Notes (Signed)
  Phone Note Refill Request Message from:  Pharmacy on November 13, 2008 2:04 PM  Refills Requested: Medication #1:  SPIRIVA HANDIHALER 18 MCG CAPS 1 inhalation daily Initial call taken by: Ami Bullins CMA,  November 13, 2008 2:18 PM      Prescriptions: SPIRIVA HANDIHALER 18 MCG CAPS (TIOTROPIUM BROMIDE MONOHYDRATE) 1 inhalation daily  #3 x 3   Entered by:   Ami Bullins CMA   Authorized by:   Minus Breeding MD   Signed by:   Bill Salinas CMA on 11/13/2008   Method used:   Electronically to        [facility] (mail-order)             ,          Ph: [phone]       Fax: [phone]   RxID:   [id number]

## 2010-09-02 NOTE — Progress Notes (Signed)
Summary: [facility]  Phone Note Outgoing Call   Summary of Call: Faxed completed paperwork to [facility] and a copy to be scanned. Initial call taken by: Josph Macho RMA,  November 14, 2009 9:37 AM

## 2010-09-02 NOTE — Assessment & Plan Note (Signed)
Summary: YEARLY FU/ LABS SAME DAY / MEDICARE /NWS   Vital Signs:  Patient profile:   77 year old female Height:      66 inches (167.64 cm) Weight:      147.38 pounds (66.99 kg) O2 Sat:      97 % on Room air Temp:     98.0 degrees F (36.67 degrees C) oral Pulse rate:   65 / minute BP sitting:   132 / 80  (left arm) Cuff size:   large  Vitals Entered By: Josph Macho CMA (July 02, 2009 9:53 AM)  O2 Flow:  Room air CC: Yearly follow up/ CF Is Patient Diabetic? No   CC:  Yearly follow up/ CF.  History of Present Illness: here for regular wellness examination.  she's feeling pretty well in general, and does not drink etoh.  she has quit smoking.   Current Medications (verified): 1)  Lisinopril 40 Mg  Tabs (Lisinopril) .... Take 1 By Mouth Qd 2)  Procardia Xl 30 Mg  Tb24 (Nifedipine) .... Take 1 By Mouth Qd 3)  Synthroid 50 Mcg  Tabs (Levothyroxine Sodium) .... Take 1 By Mouth Qd 4)  Atenolol 25 Mg  Tabs (Atenolol) .... Take 1 By Mouth Two Times A Day Qd 5)  Metformin Hcl 500 Mg  Tb24 (Metformin Hcl) .... Take 1 By Mouth Qd 6)  Adult Aspirin Low Strength 81 Mg  Tbdp (Aspirin) .... Take 1 By Mouth Qd 7)  Crestor 20 Mg  Tabs (Rosuvastatin Calcium) .... Take 1 By Mouth Qd 8)  Spiriva Handihaler 18 Mcg Caps (Tiotropium Bromide Monohydrate) .Marland Kitchen.. 1 Inhalation Daily 9)  Fosamax 70 Mg Tabs (Alendronate Sodium) .... Take 1 Q Week 10)  Hydrochlorothiazide 25 Mg Tabs (Hydrochlorothiazide) .... 1/ 2 - 1 By Mouth Once Daily  Allergies (verified): No Known Drug Allergies  Past History:  Past Medical History: Last updated: 05/17/2008 Breast cancer, hx of Colonic polyps, hx of Hyperlipidemia Hypertension Hypothyroidism DM OA  Family History: Reviewed history from 06/07/2008 and no changes required. CAD, CVD, HTN sister had "throat cancer"  Social History: Reviewed history from 06/07/2008 and no changes required. Worked Public librarian, retired now.  No other exposures,  no TB hx widowed 2005  Review of Systems       The patient complains of weight gain.  The patient denies fever, vision loss, decreased hearing, chest pain, syncope, dyspnea on exertion, prolonged cough, headaches, abdominal pain, melena, hematochezia, severe indigestion/heartburn, hematuria, suspicious skin lesions, and depression.    Physical Exam  General:  normal appearance.   Head:  head: no deformity eyes: no periorbital swelling, no proptosis external nose and ears are normal mouth: no lesion seen Neck:  Supple without thyroid enlargement or tenderness. No cervical lymphadenopathy, neck masses or tracheal deviation.  Breasts:  right breast is surgically absent.  left is normal Lungs:  Clear to auscultation bilaterally. Normal respiratory effort.  Heart:  Regular rate and rhythm without murmurs or gallops noted. Normal S1,S2.   Abdomen:  abdomen is soft, nontender.  no hepatosplenomegaly.   not distended.  no hernia  Rectal:  normal external and internal exam.  heme neg  Genitalia:  Pelvic Exam:        External: normal female genitalia without lesions or masses        Vagina: normal without lesions or masses        Cervix: normal without lesions or masses        Adnexa: normal bimanual exam without masses  or fullness        Uterus: normal by palpation        Pap smear: not performed Msk:  muscle bulk and strength are grossly normal.  no obvious joint swelling.  gait is normal and steady  Pulses:  dorsalis pedis intact bilat.  no carotid bruit  Extremities:  no ulcer on the feet.  feet are of normal color (except for bilateral varicosities), and temp.  no edema.  there are bilateral hammer toes and bunions  Neurologic:  cn 2-12 grossly intact.   readily moves all 4's.   sensation is intact to touch on the feet  Skin:  normal texture and temp.  no rash.  not diaphoretic  Cervical Nodes:  No significant adenopathy.  Psych:  Alert and cooperative; normal mood and affect;  normal attention span and concentration.     Impression & Recommendations:  Problem # 1:  HEALTH MAINTENANCE EXAM (ICD-V70.0)  Medications Added to Medication List This Visit: 1)  Right Breast Prosthesis  .... V10.3  Other Orders: EKG w/ Interpretation (93000) T-Bone Densitometry (60454) TLB-A1C / Hgb A1C (Glycohemoglobin) (83036-A1C) TLB-Microalbumin/Creat Ratio, Urine (82043-MALB) Est. Patient 65& > (09811)  Patient Instructions: 1)  we discussed the recommendations of the preventive services task force Prescriptions: HYDROCHLOROTHIAZIDE 25 MG TABS (HYDROCHLOROTHIAZIDE) 1/ 2 - 1 by mouth once daily  #100 x 3   Entered and Authorized by:   Minus Breeding MD   Signed by:   Minus Breeding MD on 07/02/2009   Method used:   Print then Give to Patient   RxID:   [id number] SPIRIVA HANDIHALER 18 MCG CAPS (TIOTROPIUM BROMIDE MONOHYDRATE) 1 inhalation daily  #3 x 3   Entered and Authorized by:   Minus Breeding MD   Signed by:   Minus Breeding MD on 07/02/2009   Method used:   Print then Give to Patient   RxID:   [id number] CRESTOR 20 MG  TABS (ROSUVASTATIN CALCIUM) take 1 by mouth qd  #90 Tablet x 3   Entered and Authorized by:   Minus Breeding MD   Signed by:   Minus Breeding MD on 07/02/2009   Method used:   Print then Give to Patient   RxID:   [id number] METFORMIN HCL 500 MG  TB24 (METFORMIN HCL) take 1 by mouth qd  #90 x 3   Entered and Authorized by:   Minus Breeding MD   Signed by:   Minus Breeding MD on 07/02/2009   Method used:   Print then Give to Patient   RxID:   [id number] ATENOLOL 25 MG  TABS (ATENOLOL) take 1 by mouth two times a day qd  #180 x 3   Entered and Authorized by:   Minus Breeding MD   Signed by:   Minus Breeding MD on 07/02/2009   Method used:   Print then Give to Patient   RxID:   [id number] SYNTHROID 50 MCG  TABS (LEVOTHYROXINE SODIUM) take 1 by mouth qd  #90 Tablet x 3   Entered and Authorized by:   Minus Breeding MD    Signed by:   Minus Breeding MD on 07/02/2009   Method used:   Print then Give to Patient   RxID:   [id number] PROCARDIA XL 30 MG  TB24 (NIFEDIPINE) take 1 by mouth qd  #90 Tablet x 3   Entered and Authorized by:   Minus Breeding MD   Signed by:  Minus Breeding MD on 07/02/2009   Method used:   Print then Give to Patient   RxID:   [id number] LISINOPRIL 40 MG  TABS (LISINOPRIL) take 1 by mouth qd  #90 Tablet x 3   Entered and Authorized by:   Minus Breeding MD   Signed by:   Minus Breeding MD on 07/02/2009   Method used:   Print then Give to Patient   RxID:   [id number] RIGHT BREAST PROSTHESIS v10.3  #1 x 0   Entered and Authorized by:   Minus Breeding MD   Signed by:   Minus Breeding MD on 07/02/2009   Method used:   Print then Give to Patient   RxID:   [id number]    Immunization History:  Influenza Immunization History:    Influenza:  historical (06/03/2009)

## 2010-09-02 NOTE — Letter (Signed)
Summary: Breast Prosthesis & Bra/[facility]  Breast Prosthesis & Bra/[facility]   Imported By: Sherian Rein 11/18/2009 12:02:44  _____________________________________________________________________  External Attachment:    Type:   Image     Comment:   External Document

## 2010-09-02 NOTE — Miscellaneous (Signed)
Summary: [facility] Previsit/prep  Clinical Lists Changes  Medications: Added new medication of MOVIPREP 100 GM  SOLR (PEG-KCL-NACL-NASULF-NA ASC-C) As per prep instructions. - Signed Rx of MOVIPREP 100 GM  SOLR (PEG-KCL-NACL-NASULF-NA ASC-C) As per prep instructions.;  #1 x 0;  Signed;  Entered by: Wyona Almas RN;  Authorized by: Meryl Dare MD [facility];  Method used: [facility]  [address]  [facility] [address] [place], [place], [place]  [address], Ph: [phone] or [phone], Fax: [phone] Observations: Added new observation of NKA: T (07/24/2008 11:00)    Prescriptions: MOVIPREP 100 GM  SOLR (PEG-KCL-NACL-NASULF-NA ASC-C) As per prep instructions.  #1 x 0   Entered by:   Wyona Almas RN   Authorized by:   Meryl Dare MD [facility]   Signed by:   Wyona Almas RN on 07/24/2008   Method used:   [facility]  [address]  [facility]       [address]       [place], [place]  [address]       Ph: [phone] or [phone]       Fax: [phone]   RxID:   [id number]

## 2010-09-02 NOTE — Letter (Signed)
Summary: Chronic stable meningioma/[facility]  Chronic stable meningioma/[facility]   Imported By: Lester Reliance 09/10/2008 11:53:20  _____________________________________________________________________  External Attachment:    Type:   Image     Comment:   External Document

## 2010-09-02 NOTE — Assessment & Plan Note (Signed)
Summary: MEDICARE WELLNESS/ LABS AFTER/NWS   Vital Signs:  Patient profile:   77 year old female Height:      66 inches (167.64 cm) Weight:      144.50 pounds (65.68 kg) BMI:     23.41 O2 Sat:      89 % on Room air Temp:     97.4 degrees F (36.33 degrees C) oral Pulse rate:   77 / minute BP sitting:   128 / 82  (left arm) Cuff size:   large  Vitals Entered By: Brenton Grills CMA Duncan Dull) (July 03, 2010 9:47 AM)  O2 Flow:  Room air CC: Yearly F/U/pt is no longer taking HCTZ/aj Is Patient Diabetic? Yes   CC:  Yearly F/U/pt is no longer taking HCTZ/aj.  History of Present Illness: here for regular wellness examination.  He's feeling pretty well in general.  etoh is rare.  she has been on fosamax for "years."   Current Medications (verified): 1)  Lisinopril 40 Mg  Tabs (Lisinopril) .... Take 1 By Mouth Qd 2)  Procardia Xl 30 Mg  Tb24 (Nifedipine) .... Take 1 By Mouth Qd 3)  Synthroid 50 Mcg  Tabs (Levothyroxine Sodium) .... Take 1 By Mouth Qd 4)  Atenolol 25 Mg  Tabs (Atenolol) .... Take 1 By Mouth Two Times A Day Qd 5)  Metformin Hcl 500 Mg  Tb24 (Metformin Hcl) .... Take 1 By Mouth Qd 6)  Adult Aspirin Low Strength 81 Mg  Tbdp (Aspirin) .... Take 1 By Mouth Qd 7)  Crestor 20 Mg  Tabs (Rosuvastatin Calcium) .... Take 1 By Mouth Qd 8)  Spiriva Handihaler 18 Mcg Caps (Tiotropium Bromide Monohydrate) .Marland Kitchen.. 1 Inhalation Daily 9)  Hydrochlorothiazide 25 Mg Tabs (Hydrochlorothiazide) .... 1/ 2 - 1 By Mouth Once Daily 10)  Right Breast Prosthesis .... V10.3 11)  Fosamax 70 Mg Tabs (Alendronate Sodium) .... Take 1 Q Week  Allergies (verified): No Known Drug Allergies  Family History: Reviewed history from 06/07/2008 and no changes required. CAD, CVD, HTN sister had "throat cancer" mother had mi at age 9  Social History: Reviewed history from 06/07/2008 and no changes required. Worked Public librarian, retired now.  No other exposures, no TB hx widowed 2005 quit smoking  2010  Physical Exam  General:  normal appearance.   Head:  head: no deformity eyes: no periorbital swelling, no proptosis external nose and ears are normal mouth: no lesion seen Neck:  Supple without thyroid enlargement or tenderness.  Breasts:  sees gyn  Lungs:  Clear to auscultation bilaterally. Normal respiratory effort.  Heart:  Regular rate and rhythm without murmurs or gallops noted. Normal S1,S2.   Abdomen:  abdomen is soft, nontender.  no hepatosplenomegaly.   not distended.  no hernia  Rectal:  sees gyn  Genitalia:  sees gyn  Msk:  muscle bulk and strength are grossly normal.  no obvious joint swelling.  gait is normal and steady  Pulses:  dorsalis pedis intact bilat.  no carotid bruit  Extremities:  no deformity.  no ulcer on the feet.  feet are of normal color and temp.  no edema there are bilat varicosities ther are bilateral bunions hammer toes are prsent bilaterally mycotic toenails.   Neurologic:  cn 2-12 grossly intact.   readily moves all 4's.   sensation is intact to touch on the feet  Skin:  normal texture and temp.  no rash.  not diaphoretic  Cervical Nodes:  No significant adenopathy.  Psych:  Alert  and cooperative; normal mood and affect; normal attention span and concentration.     Impression & Recommendations:  Problem # 1:  HEALTH MAINTENANCE EXAM (ICD-V70.0)  Medications Added to Medication List This Visit: 1)  Lisinopril 40 Mg Tabs (Lisinopril) .... Take 1 by mouth once daily 2)  Procardia Xl 30 Mg Tb24 (Nifedipine) .... Take 1 by mouth once daily 3)  Synthroid 50 Mcg Tabs (Levothyroxine sodium) .... Take 1 by mouth once daily 4)  Atenolol 25 Mg Tabs (Atenolol) .... Take 1 by mouth two times a day 5)  Metformin Hcl 500 Mg Tb24 (Metformin hcl) .... Take 1 by mouth once daily 6)  Adult Aspirin Low Strength 81 Mg Tbdp (Aspirin) .... Take 1 by mouth once daily 7)  Crestor 20 Mg Tabs (Rosuvastatin calcium) .... Take 1 by mouth once  daily  Other Orders: TLB-Lipid Panel (80061-LIPID) TLB-BMP (Basic Metabolic Panel-BMET) (80048-METABOL) TLB-CBC Platelet - w/Differential (85025-CBCD) TLB-Hepatic/Liver Function Pnl (80076-HEPATIC) TLB-TSH (Thyroid Stimulating Hormone) (84443-TSH) TLB-A1C / Hgb A1C (Glycohemoglobin) (83036-A1C) TLB-Microalbumin/Creat Ratio, Urine (82043-MALB) TLB-Udip w/ Micro (81001-URINE) T-Parathyroid Hormone, Intact w/ Calcium (13086-57846) EKG w/ Interpretation (93000) Est. Patient 65& > (96295) Pneumococcal Vaccine (28413) Admin 1st Vaccine (24401)  Preventive Care Screening  Mammogram:    Date:  06/20/2010    Results:  normal   Last Flu Shot:    Date:  05/03/2010    Results:  Historical      gyn is dr Seymour Bars   Patient Instructions: 1)  blood tests are being ordered for you today.  please call [phone] to hear your test results. 2)  pending the test results, please continue the same medications for now 3)  please consider these measures for your health:  minimize alcohol.  do not use tobacco products.  have a colonoscopy at least every 10 years from age 18.  keep firearms safely stored.  always use seat belts.  have working smoke alarms in your home.  see an eye doctor and dentist regularly.  never drive under the influence of alcohol or drugs (including prescription drugs).  those with fair skin should take precautions against the sun. 4)  please let me know what your wishes would be, if artificial life support measures should become necessary.  it is critically important to prevent falling down (keep floor areas well-lit, dry, and free of loose objects) 5)  Please schedule a follow-up appointment in 1 year. 6)  stop fosamax 7)  (update:  we discussed code status.  pt requests full code, but would not want to be started or maintained on artificial life-support measures if there was not a reasonable chance of recovery) Prescriptions: HYDROCHLOROTHIAZIDE 25 MG TABS (HYDROCHLOROTHIAZIDE) 1/  2 - 1 by mouth once daily  #100 x 3   Entered and Authorized by:   Minus Breeding MD   Signed by:   Minus Breeding MD on 07/03/2010   Method used:   Electronically to        [facility] (retail)       [address]       [place], [place]  [address]       Ph: [phone] or [phone]       Fax: [phone]   RxID:   [id number] SPIRIVA HANDIHALER 18 MCG CAPS (TIOTROPIUM BROMIDE MONOHYDRATE) 1 inhalation daily  #3 x 3   Entered and Authorized by:   Minus Breeding MD   Signed by:   Minus Breeding MD on 07/03/2010   Method  used:   Electronically to        [facility] (retail)       [address]       [place], [place]  [address]       Ph: [phone] or [phone]       Fax: [phone]   RxID:   [id number] CRESTOR 20 MG  TABS (ROSUVASTATIN CALCIUM) take 1 by mouth once daily  #90 x 3   Entered and Authorized by:   Minus Breeding MD   Signed by:   Minus Breeding MD on 07/03/2010   Method used:   Electronically to        [facility] (retail)       [address]       [place], [place]  [address]       Ph: [phone] or [phone]       Fax: [phone]   RxID:   [id number] METFORMIN HCL 500 MG  TB24 (METFORMIN HCL) take 1 by mouth once daily  #90 x 3   Entered and Authorized by:   Minus Breeding MD   Signed by:   Minus Breeding MD on 07/03/2010   Method used:   Electronically to        [facility] (retail)       [address]       [place], [place]  [address]       Ph: [phone] or [phone]       Fax: [phone]   RxID:   [id number] ATENOLOL 25 MG  TABS (ATENOLOL) take 1 by mouth two times a day  #180 x 3   Entered and Authorized by:   Minus Breeding MD   Signed by:   Minus Breeding MD on 07/03/2010   Method used:   Electronically to        [facility] (retail)       [address]       [place], [place]  [address]       Ph: [phone] or [phone]       Fax: [phone]   RxID:   [id number] SYNTHROID 50 MCG   TABS (LEVOTHYROXINE SODIUM) take 1 by mouth once daily  #90 x 3   Entered and Authorized by:   Minus Breeding MD   Signed by:   Minus Breeding MD on 07/03/2010   Method used:   Electronically to        [facility] (retail)       [address]       [place], [place]  [address]       Ph: [phone] or [phone]       Fax: [phone]   RxID:   [id number] PROCARDIA XL 30 MG  TB24 (NIFEDIPINE) take 1 by mouth once daily  #90 x 3   Entered and Authorized by:   Minus Breeding MD   Signed by:   Minus Breeding MD on 07/03/2010   Method used:   Electronically to        [facility] (retail)       [address]       [place], [place]  [address]       Ph: [phone] or [phone]       Fax: [phone]   RxID:   [id number] LISINOPRIL 40 MG  TABS (LISINOPRIL) take 1 by mouth once daily  #90 x 3  Entered and Authorized by:   Minus Breeding MD   Signed by:   Minus Breeding MD on 07/03/2010   Method used:   Electronically to        [facility] (retail)       [address]       [place], [place]  [address]       Ph: [phone] or [phone]       Fax: [phone]   RxID:   [id number]    Orders Added: 1)  TLB-Lipid Panel [80061-LIPID] 2)  TLB-BMP (Basic Metabolic Panel-BMET) [80048-METABOL] 3)  TLB-CBC Platelet - w/Differential [85025-CBCD] 4)  TLB-Hepatic/Liver Function Pnl [80076-HEPATIC] 5)  TLB-TSH (Thyroid Stimulating Hormone) [84443-TSH] 6)  TLB-A1C / Hgb A1C (Glycohemoglobin) [83036-A1C] 7)  TLB-Microalbumin/Creat Ratio, Urine [82043-MALB] 8)  TLB-Udip w/ Micro [81001-URINE] 9)  T-Parathyroid Hormone, Intact w/ Calcium [16606-30160] 10)  EKG w/ Interpretation [93000] 11)  Est. Patient 65& > [99397] 12)  Pneumococcal Vaccine [90732] 13)  Admin 1st Vaccine [10932]   Immunization History:  Influenza Immunization History:    Influenza:  historical (05/03/2010)  Immunizations Administered:  Pneumonia Vaccine:    Vaccine Type: Pneumovax     Site: left deltoid    Mfr: Merck    Dose: 0.5 ml    Route: IM    Given by: Brenton Grills CMA (AAMA)    Exp. Date: 11/28/2011    Lot #: [id number]    VIS given: 07/08/09 version given July 03, 2010.   Immunization History:  Influenza Immunization History:    Influenza:  Historical (05/03/2010)  Immunizations Administered:  Pneumonia Vaccine:    Vaccine Type: Pneumovax    Site: left deltoid    Mfr: Merck    Dose: 0.5 ml    Route: IM    Given by: Brenton Grills CMA (AAMA)    Exp. Date: 11/28/2011    Lot #: [id number]    VIS given: 07/08/09 version given July 03, 2010.   Preventive Care Screening  Mammogram:    Date:  06/20/2010    Results:  normal   Last Flu Shot:    Date:  05/03/2010    Results:  Historical

## 2010-09-02 NOTE — Letter (Signed)
Summary: [facility]  [facility]   Imported By: Sherian Rein 05/16/2009 11:15:26  _____________________________________________________________________  External Attachment:    Type:   Image     Comment:   External Document

## 2010-09-04 NOTE — Letter (Signed)
Summary: [facility]  [facility]   Imported By: Sherian Rein 08/20/2010 11:16:26  _____________________________________________________________________  External Attachment:    Type:   Image     Comment:   External Document

## 2010-11-04 LAB — GLUCOSE, CAPILLARY: Glucose-Capillary: 90 mg/dL (ref 70–99)

## 2010-11-06 LAB — PROTIME-INR
INR: 0.96 (ref 0.00–1.49)
Prothrombin Time: 12.7 seconds (ref 11.6–15.2)

## 2010-11-06 LAB — GLUCOSE, CAPILLARY: Glucose-Capillary: 100 mg/dL — ABNORMAL HIGH (ref 70–99)

## 2010-11-06 LAB — CBC
HCT: 34.8 % — ABNORMAL LOW (ref 36.0–46.0)
Hemoglobin: 12 g/dL (ref 12.0–15.0)
MCHC: 34.5 g/dL (ref 30.0–36.0)
MCV: 96.1 fL (ref 78.0–100.0)
Platelets: 202 10*3/uL (ref 150–400)
RBC: 3.63 MIL/uL — ABNORMAL LOW (ref 3.87–5.11)
RDW: 14.1 % (ref 11.5–15.5)
WBC: 8.1 10*3/uL (ref 4.0–10.5)

## 2010-11-06 LAB — APTT: aPTT: 28 seconds (ref 24–37)

## 2010-11-17 LAB — GLUCOSE, CAPILLARY: Glucose-Capillary: 92 mg/dL (ref 70–99)

## 2010-12-16 NOTE — Assessment & Plan Note (Signed)
OFFICE VISIT   Poteat, Charlotte Harris  DOB:  14-May-1932                                        June 20, 2009  CHART #:  [id number]   The patient came today and we had informed her the diagnosis that the  lung biopsy with adenocarcinoma.  She was present at the cancer  conference, and the consensus was to proceed with radiation therapy.  She was seen by Dr. Mitzi Hansen who discussed the risk and benefits of  radiation therapy and plans to proceed.  Her blood pressure is 114/68,  pulse 78, respirations 18, sats were 93%, and I will see her again after  she completes her therapy.   Ines Bloomer, M.D.  Electronically Signed   DPB/MEDQ  D:  06/20/2009  T:  06/21/2009  Job:  [id number]

## 2010-12-16 NOTE — Letter (Signed)
May 17, 2008   Nadyne Coombes M.D.  [facility]  [address]  [place], [place]  [address]   Re:  Charlotte Harris, GILBERTO                  DOB:  13-Apr-1932   Dear Dr. Hal Hope:   The patient came back today.  We reviewed the tests that we ordered on  her, and her pulmonary function tests show that her FVC was 2.08, but  her FEV-1 was 0.82, and her diffusion capacity was 64%, but corrected to  110%.  Unfortunately, her PET scan was marginally positive in the two  right upper lobe lesions, we had a needle biopsy done, which was showing  atypical glandular proliferation, but the pathoologist  really could not  call cancer.  I still think these could possibly be bronchioalveolar  cancers of the two lesions, and she may eventually require a lobectomy  for pulmonary function test, we will improve. 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  Because of this, I am refer her to a neurosurgeon, Dr. Julio Sicks for  his evaluation.  We also had Dr. Levy Pupa see her for a pulmonary  evaluation, and he will put her on maximum therapy.  We will encourage  her to stop smoking.  I will plan to see her back again in 3 months with  a CT scan.  Hopefully, her lungs would have improved and that we could  reconsider possibly doing a lobectomy on her.  I have explained this in  great detail to the patient, and I believe she understands the  situation.   I appreciate the opportunity of seeing the patient.   Ines Bloomer, M.D.  Electronically Signed   DPB/MEDQ  D:  05/17/2008  T:  05/18/2008  Job:  [id number]

## 2010-12-16 NOTE — Letter (Signed)
May 01, 2009   Clydie Braun L. Hal Hope, MD  [address] [place], [place] [address]   Re:  MARGERITE, IMPASTATO                  DOB:  10/27/1931   Dear Dr. Hal Hope;   I saw the patient on our office today for her 25-month followup of her CT  scans on the right upper lobe nodule.  The largest of her right upper  lobe nodule appears to increase slightly in size.  Her pulmonary  function test, which we got a year ago still showed an FEV-1 of 0.71,  but I will repeat that now and scheduled for a needle biopsy of the  largest of this enlarging lesion.  Her blood pressure is 138/73, pulse  76, respirations 18, sats were 93%.   Ines Bloomer, M.D.  Electronically Signed   DPB/MEDQ  D:  05/01/2009  T:  05/02/2009  Job:  [id number]   cc:   Gregary Signs A. Everardo All, MD

## 2010-12-16 NOTE — Letter (Signed)
August 15, 2008   Clydie Braun L. Hal Hope, MD  [address] [place], [place] [address]   Re:  LANIE, SCHELLING                  DOB:  02/03/1932   Dear Dr. Hal Hope:   I saw the patient back in the office today.  As you know, we did a  biopsy of her left upper lobe mass.  A right upper lobe mass showed  atypical glandular proliferation.  She also has noted lesion in the  right upper lobe that is 2.8 cm in size that looks ground-glass in  appearance.  Her pulmonary function tests showed an FVC of 0.71 at 37%  of predicted.  She has evidence of severe obstructive pulmonary disease.  CT scan today showed no change in both of the right upper lobe lesions.  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  Unfortunately, I am not sure she can tolerate a lobectomy,  although she has a good performance status.  Her blood pressure is  133/74, pulse 76, respirations 18, and sats were 94%.  Dr. 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  This is probably a slow-growing cancer and I will continue to  follow her if there has been a change and we will repeat her lung  function test and then decide on repeating biopsy.   Ines Bloomer, M.D.  Electronically Signed   DPB/MEDQ  D:  08/15/2008  T:  08/15/2008  Job:  [id number]   cc:   Henry A. Pool, M.D.

## 2010-12-16 NOTE — Assessment & Plan Note (Signed)
OFFICE VISIT   Harris, Charlotte B  DOB:  1931-12-27                                        June 18, 2009  CHART #:  [id number]   The patient called today about her biopsy results that she had in late  October.  Apparently, the report was never sent as it was not in her  chart, so we looked up the report and I called her and told her that she  did have cancer, this was a slow-growing cancer, but she needed to be  seen.  Given her pulmonary function tests with an FEV-1 of 0.78, I do  not think she is an operative candidate.  I plan to see her in the  [facility]acic Oncology Clinic this week, and we  will discuss the options which will probably be SBRT.   Ines Bloomer, M.D.  Electronically Signed   DPB/MEDQ  D:  06/18/2009  T:  06/19/2009  Job:  [id number]

## 2010-12-16 NOTE — Assessment & Plan Note (Signed)
OFFICE VISIT   PARRISH, DADDARIO B  DOB:  1931-12-31                                        Dec 11, 2008  CHART #:  [id number]   The patient returns today.  She is breathing better on the Spiriva.  We  repeat her CT scan and there is overall similar appearance of 3 right  upper lobe nodules.  The large ground glass one appears to be worrisome  and has slightly decreased in one measurement, but is a little large in  the other measurement, so the overall volume is about the same.  Still  very concerned this is a bronchoalveolar cancer given the atypical range  of proliferation but with an FEV-1 of 0.71 and 3 nodules in that lobe,  she would require a right upper lobectomy.  I discussed this with her  and we will see her back again in 4 months with another CT scan.  If any  of those are getting bigger, we will repeat a biopsy and then consider  either surgery or radiation.   Ines Bloomer, M.D.  Electronically Signed   DPB/MEDQ  D:  12/11/2008  T:  12/12/2008  Job:  [id number]

## 2010-12-16 NOTE — Letter (Signed)
May 09, 2008   Clydie Braun L. Hal Hope, MD  [address]  [place], [place] [address]   Re:  Charlotte Harris, PANGALLO                  DOB:  November 17, 1931   Dear Dr. Hal Hope:   I appreciate the opportunity to see the patient.  This 77 year old  patient has been a long-time smoker, and she smokes about half pack of  cigarettes a day.  She had an episode of some sinus drainage and  shortness of breath and was seen and found to have a questionable right  upper lobe lesion.  She underwent a CT scan at [facility], which  showed 2 lesions in the right upper lobe.  Needle biopsy was done of the  more solid of these lesions, and it came back atypical glandular  proliferation.  She is referred here for evaluation.  She has had no  fever, chills, excessive sputum, and no weight loss.  She is able to  climb a flight of stairs without difficulty.   She is on atenolol 25 mg twice a day, metformin 500 mg twice a day and  is taken care by Dr. Romero Belling, Crestor 20 mg daily, nifedipine ER 30  mg daily, lisinopril 40 mg daily, and alendronate 70 mg twice a month.  She also takes thyroxine.   ALLERGIES:  She has no allergies.   She has hypothyroidism, hypercholesterolemia, hypertension, and diabetes  mellitus type 2.  She has had previous breast cancer with a right  axillary dissection.   FAMILY HISTORY:  Noncontributory.   SOCIAL HISTORY:  She is a widow and has 2 children.  Again, smokes a  half pack of cigarettes a day.  Does not drink alcohol on a regular  basis.   REVIEW OF SYSTEMS:  Vital Signs:  Weight has been stable.  She is 5 feet  6 inches, 134 pounds.  Cardiac:  No angina or atrial fibrillation.  Pulmonary:  See history of present illness.  GI:  No nausea, vomiting,  constipation, or diarrhea.  GU:  No GERD, dysuria, or frequent  urination.  Vascular:  No claudication, DVT, or TIAs.  Neurologic:  No  dizziness, headaches, blackouts, or seizures.  Musculoskeletal:  She  has  arthritis.  Psychiatric:  No depression or nervous.  Eye/ENT:  No change  in her eyesight or hearing.  Hematologic:  No problems with bleeding or  clotting disorders.   PHYSICAL EXAMINATION:  GENERAL:  She is a well-developed Caucasian  female, in no acute distress.  VITAL SIGNS:  Her blood pressure is 135/83, pulse 73, respirations 18,  and sats were 98%.  HEAD, EYES, EARS, NOSE, AND THROAT:  Unremarkable.  NECK:  Supple without thyromegaly.  No supraclavicular or axillary  adenopathy.  CHEST:  Clear to auscultation and percussion.  HEART:  Regular sinus rhythm.  No murmurs.  ABDOMEN:  Soft.  There is no hepatosplenomegaly.  Pulses are 2+.  EXTREMITIES:  There is no clubbing or edema.  NEUROLOGIC:  She is oriented x3.  Sensory and motor intact.  Cranial  nerves intact.   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  I will plan to get  a brain scan, PET scan, and full set of pulmonary function test, and  then, she will be hopefully a candidate for a right upper lobectomy.  I  will see her back  again in the Multidisciplinary Oncology Clinic next  Thursday after the above tests are obtained.  In the future, it might be  more helpful if she is just referred directly to our [facility], which  is our Multidisciplinary Oncology Clinic, and we can do her complete  workup in a very efficient pattern.  Also, a needle biopsy in this case  we usually do not recommend until after the PET scan has been done.  I  appreciate the opportunity of seeing the patient.   Ines Bloomer, M.D.  Electronically Signed   DPB/MEDQ  D:  05/09/2008  T:  05/09/2008  Job:  [id number]   cc:   Gregary Signs A. Everardo All, MD

## 2010-12-28 ENCOUNTER — Other Ambulatory Visit: Payer: Self-pay | Admitting: Endocrinology

## 2011-02-07 ENCOUNTER — Other Ambulatory Visit: Payer: Self-pay | Admitting: Endocrinology

## 2011-02-13 ENCOUNTER — Ambulatory Visit
Admission: RE | Admit: 2011-02-13 | Discharge: 2011-02-13 | Disposition: A | Discharge status: Home / Self Care | Payer: PRIVATE HEALTH INSURANCE | Source: Ambulatory Visit | Attending: Radiation Oncology | Admitting: Radiation Oncology

## 2011-02-13 DIAGNOSIS — C3410 Malignant neoplasm of upper lobe, unspecified bronchus or lung: Secondary | ICD-10-CM | POA: Insufficient documentation

## 2011-02-13 LAB — CREATININE, SERUM: Creatinine, Ser: 0.86 mg/dL (ref 0.50–1.10)

## 2011-02-13 LAB — BUN: BUN: 17 mg/dL (ref 6–23)

## 2011-02-18 ENCOUNTER — Ambulatory Visit (HOSPITAL_COMMUNITY)
Admission: RE | Admit: 2011-02-18 | Discharge: 2011-02-18 | Disposition: A | Discharge status: Home / Self Care | Payer: PRIVATE HEALTH INSURANCE | Source: Ambulatory Visit | Attending: Radiation Oncology | Admitting: Radiation Oncology

## 2011-02-18 ENCOUNTER — Encounter (HOSPITAL_COMMUNITY): Payer: Self-pay

## 2011-02-18 DIAGNOSIS — Z853 Personal history of malignant neoplasm of breast: Secondary | ICD-10-CM | POA: Insufficient documentation

## 2011-02-18 DIAGNOSIS — J984 Other disorders of lung: Secondary | ICD-10-CM | POA: Insufficient documentation

## 2011-02-18 DIAGNOSIS — J438 Other emphysema: Secondary | ICD-10-CM | POA: Insufficient documentation

## 2011-02-18 DIAGNOSIS — C3490 Malignant neoplasm of unspecified part of unspecified bronchus or lung: Secondary | ICD-10-CM | POA: Insufficient documentation

## 2011-02-18 DIAGNOSIS — D3500 Benign neoplasm of unspecified adrenal gland: Secondary | ICD-10-CM | POA: Insufficient documentation

## 2011-02-18 DIAGNOSIS — Z923 Personal history of irradiation: Secondary | ICD-10-CM | POA: Insufficient documentation

## 2011-02-18 DIAGNOSIS — Z85118 Personal history of other malignant neoplasm of bronchus and lung: Secondary | ICD-10-CM

## 2011-02-18 HISTORY — DX: Essential (primary) hypertension: I10

## 2011-02-18 MED ORDER — IOHEXOL 300 MG/ML  SOLN
80.0000 mL | Freq: Once | INTRAMUSCULAR | Status: AC | PRN
  Administered 2011-02-18: 80 mL via INTRAVENOUS

## 2011-02-27 ENCOUNTER — Ambulatory Visit
Admission: RE | Admit: 2011-02-27 | Discharge: 2011-02-27 | Disposition: A | Discharge status: Home / Self Care | Payer: PRIVATE HEALTH INSURANCE | Source: Ambulatory Visit | Attending: Radiation Oncology | Admitting: Radiation Oncology

## 2011-03-02 ENCOUNTER — Other Ambulatory Visit: Payer: Self-pay | Admitting: Radiation Oncology

## 2011-03-02 DIAGNOSIS — C3490 Malignant neoplasm of unspecified part of unspecified bronchus or lung: Secondary | ICD-10-CM

## 2011-05-04 LAB — GLUCOSE, CAPILLARY
Glucose-Capillary: 99
Glucose-Capillary: 100 — ABNORMAL HIGH

## 2011-05-04 LAB — CBC
HCT: 37.5
Hemoglobin: 12.5
MCHC: 33.3
MCV: 95.4
Platelets: 213
RBC: 3.93
RDW: 14.2
WBC: 7.5

## 2011-05-14 ENCOUNTER — Other Ambulatory Visit: Payer: Self-pay | Admitting: Endocrinology

## 2011-05-15 ENCOUNTER — Other Ambulatory Visit: Payer: Self-pay | Admitting: Endocrinology

## 2011-05-15 DIAGNOSIS — Z1231 Encounter for screening mammogram for malignant neoplasm of breast: Secondary | ICD-10-CM

## 2011-06-22 ENCOUNTER — Other Ambulatory Visit: Payer: Self-pay | Admitting: Endocrinology

## 2011-06-22 ENCOUNTER — Ambulatory Visit (HOSPITAL_COMMUNITY)
Admission: RE | Admit: 2011-06-22 | Discharge: 2011-06-22 | Disposition: A | Discharge status: Home / Self Care | Payer: PRIVATE HEALTH INSURANCE | Source: Ambulatory Visit | Attending: Endocrinology | Admitting: Endocrinology

## 2011-06-22 DIAGNOSIS — Z853 Personal history of malignant neoplasm of breast: Secondary | ICD-10-CM

## 2011-06-22 DIAGNOSIS — Z1231 Encounter for screening mammogram for malignant neoplasm of breast: Secondary | ICD-10-CM | POA: Insufficient documentation

## 2011-06-25 ENCOUNTER — Other Ambulatory Visit: Payer: Self-pay | Admitting: Endocrinology

## 2011-07-02 ENCOUNTER — Other Ambulatory Visit: Payer: Self-pay | Admitting: Endocrinology

## 2011-07-06 ENCOUNTER — Encounter: Payer: Self-pay | Admitting: Endocrinology

## 2011-07-06 ENCOUNTER — Other Ambulatory Visit (INDEPENDENT_AMBULATORY_CARE_PROVIDER_SITE_OTHER): Payer: PRIVATE HEALTH INSURANCE

## 2011-07-06 ENCOUNTER — Ambulatory Visit (INDEPENDENT_AMBULATORY_CARE_PROVIDER_SITE_OTHER): Payer: PRIVATE HEALTH INSURANCE | Admitting: Endocrinology

## 2011-07-06 VITALS — BP 122/66 | HR 68 | Temp 98.2°F | Ht 66.0 in | Wt 136.2 lb

## 2011-07-06 DIAGNOSIS — Z87448 Personal history of other diseases of urinary system: Secondary | ICD-10-CM

## 2011-07-06 DIAGNOSIS — Z119 Encounter for screening for infectious and parasitic diseases, unspecified: Secondary | ICD-10-CM | POA: Insufficient documentation

## 2011-07-06 DIAGNOSIS — E119 Type 2 diabetes mellitus without complications: Secondary | ICD-10-CM

## 2011-07-06 DIAGNOSIS — I10 Essential (primary) hypertension: Secondary | ICD-10-CM

## 2011-07-06 DIAGNOSIS — Z86010 Personal history of colonic polyps: Secondary | ICD-10-CM

## 2011-07-06 DIAGNOSIS — Z79899 Other long term (current) drug therapy: Secondary | ICD-10-CM

## 2011-07-06 DIAGNOSIS — E890 Postprocedural hypothyroidism: Secondary | ICD-10-CM

## 2011-07-06 DIAGNOSIS — M810 Age-related osteoporosis without current pathological fracture: Secondary | ICD-10-CM

## 2011-07-06 DIAGNOSIS — E785 Hyperlipidemia, unspecified: Secondary | ICD-10-CM

## 2011-07-06 LAB — LIPID PANEL
Cholesterol: 132 mg/dL (ref 0–200)
HDL: 67.8 mg/dL (ref >39.00)
LDL Cholesterol: 50 mg/dL (ref 0–99)
Total CHOL/HDL Ratio: 2
Triglycerides: 73 mg/dL (ref 0.0–149.0)
VLDL: 14.6 mg/dL (ref 0.0–40.0)

## 2011-07-06 LAB — BASIC METABOLIC PANEL
BUN: 16 mg/dL (ref 6–23)
CO2: 30 mEq/L (ref 19–32)
Calcium: 9.5 mg/dL (ref 8.4–10.5)
Chloride: 104 mEq/L (ref 96–112)
Creatinine, Ser: 0.8 mg/dL (ref 0.4–1.2)
GFR: 70.78 mL/min (ref >60.00)
Glucose, Bld: 98 mg/dL (ref 70–99)
Potassium: 5 mEq/L (ref 3.5–5.1)
Sodium: 140 mEq/L (ref 135–145)

## 2011-07-06 LAB — URINALYSIS, ROUTINE W REFLEX MICROSCOPIC
Bilirubin Urine: NEGATIVE
Ketones, ur: NEGATIVE
Nitrite: NEGATIVE
Specific Gravity, Urine: 1.01 (ref 1.000–1.030)
Total Protein, Urine: NEGATIVE
Urine Glucose: NEGATIVE
Urobilinogen, UA: 0.2 (ref 0.0–1.0)
pH: 6.5 (ref 5.0–8.0)

## 2011-07-06 LAB — CBC WITH DIFFERENTIAL/PLATELET
Basophils Absolute: 0 10*3/uL (ref 0.0–0.1)
Basophils Relative: 0.4 % (ref 0.0–3.0)
Eosinophils Absolute: 0.1 10*3/uL (ref 0.0–0.7)
Eosinophils Relative: 1.5 % (ref 0.0–5.0)
HCT: 38.5 % (ref 36.0–46.0)
Hemoglobin: 12.9 g/dL (ref 12.0–15.0)
Lymphocytes Relative: 24.4 % (ref 12.0–46.0)
Lymphs Abs: 1.4 10*3/uL (ref 0.7–4.0)
MCHC: 33.5 g/dL (ref 30.0–36.0)
MCV: 96.4 fl (ref 78.0–100.0)
Monocytes Absolute: 0.5 10*3/uL (ref 0.1–1.0)
Monocytes Relative: 8.6 % (ref 3.0–12.0)
Neutro Abs: 3.7 10*3/uL (ref 1.4–7.7)
Neutrophils Relative %: 65.1 % (ref 43.0–77.0)
Platelets: 200 10*3/uL (ref 150.0–400.0)
RBC: 3.99 Mil/uL (ref 3.87–5.11)
RDW: 14.3 % (ref 11.5–14.6)
WBC: 5.7 10*3/uL (ref 4.5–10.5)

## 2011-07-06 LAB — HEPATIC FUNCTION PANEL
ALT: 13 U/L (ref 0–35)
AST: 19 U/L (ref 0–37)
Albumin: 4.1 g/dL (ref 3.5–5.2)
Alkaline Phosphatase: 43 U/L (ref 39–117)
Bilirubin, Direct: 0.1 mg/dL (ref 0.0–0.3)
Total Bilirubin: 0.6 mg/dL (ref 0.3–1.2)
Total Protein: 7.2 g/dL (ref 6.0–8.3)

## 2011-07-06 LAB — MICROALBUMIN / CREATININE URINE RATIO
Creatinine,U: 41.4 mg/dL
Microalb Creat Ratio: 3.6 mg/g (ref 0.0–30.0)
Microalb, Ur: 1.5 mg/dL (ref 0.0–1.9)

## 2011-07-06 LAB — TSH: TSH: 1.18 u[IU]/mL (ref 0.35–5.50)

## 2011-07-06 LAB — HEMOGLOBIN A1C: Hgb A1c MFr Bld: 6 % (ref 4.6–6.5)

## 2011-07-06 MED ORDER — TIOTROPIUM BROMIDE MONOHYDRATE 18 MCG IN CAPS: 18.0000 ug | ORAL_CAPSULE | Freq: Every day | RESPIRATORY_TRACT | Status: DC

## 2011-07-06 MED ORDER — LISINOPRIL 40 MG PO TABS: 40.0000 mg | ORAL_TABLET | Freq: Every day | ORAL | Status: DC

## 2011-07-06 MED ORDER — NIFEDIPINE ER OSMOTIC RELEASE 30 MG PO TB24: ORAL_TABLET | ORAL | Status: DC

## 2011-07-06 MED ORDER — METFORMIN HCL ER 500 MG PO TB24: 500.0000 mg | ORAL_TABLET | Freq: Every day | ORAL | Status: DC

## 2011-07-06 MED ORDER — ATORVASTATIN CALCIUM 80 MG PO TABS: 80.0000 mg | ORAL_TABLET | Freq: Every day | ORAL | Status: DC

## 2011-07-06 MED ORDER — ROSUVASTATIN CALCIUM 20 MG PO TABS: 20.0000 mg | ORAL_TABLET | Freq: Every day | ORAL | Status: DC

## 2011-07-06 MED ORDER — LEVOTHYROXINE SODIUM 50 MCG PO TABS: 50.0000 ug | ORAL_TABLET | Freq: Every day | ORAL | Status: DC

## 2011-07-06 NOTE — Progress Notes (Signed)
Subjective:    Patient ID: Charlotte Harris, female    DOB: [birth year], 77 y.o.   MRN: [id number]  HPI here for regular wellness examination.  He's feeling pretty well in general, and says chronic med probs are stable, except as noted below.  She says she has never had V-Z. Past Medical History  Diagnosis Date  . Cancer   . Hypertension   . ADENOCARCINOMA, LUNG 07/03/2010  . AODM 06/21/2007  . ASYMPTOMATIC POSTMENOPAUSAL STATUS 07/02/2009  . BREAST CANCER, HX OF 04/16/2007  . COLONIC POLYPS, HX OF 04/16/2007  . COPD 05/17/2008  . HEMATURIA, HX OF 04/16/2007  . HYPERLIPIDEMIA 04/16/2007  . HYPERTENSION 04/16/2007  . HYPOTHYROIDISM, POST-RADIATION 06/07/2008  . OSTEOPOROSIS 07/02/2009  . PERIPHERAL EDEMA 02/15/2009  . Osteoarthritis     Past Surgical History  Procedure Date  . Mastectomy 1991    right    History   Social History  . Marital Status: Widowed    Spouse Name: N/A    Number of Children: N/A  . Years of Education: N/A   Occupational History  . Retired (worked Public librarian)    Social History Main Topics  . Smoking status: Former Smoker    Quit date: 08/03/2008  . Smokeless tobacco: Not on file  . Alcohol Use:   . Drug Use:   . Sexually Active:    Other Topics Concern  . Not on file   Social History Narrative   Widowed 2005Quit smoking 2010No other exposures, no TB hx    Current Outpatient Prescriptions on File Prior to Visit  Medication Sig Dispense Refill  . aspirin 81 MG tablet Take 81 mg by mouth daily.        Marland Kitchen atenolol (TENORMIN) 25 MG tablet TAKE 1 BY MOUTH TWO TIMES A DAY  180 tablet  3  . levothyroxine (SYNTHROID, LEVOTHROID) 50 MCG tablet Take 50 mcg by mouth daily.        Marland Kitchen lisinopril (PRINIVIL,ZESTRIL) 40 MG tablet Take 40 mg by mouth daily.        . metFORMIN (GLUCOPHAGE-XR) 500 MG 24 hr tablet Take 500 mg by mouth daily.        Marland Kitchen NIFEdipine (PROCARDIA XL/ADALAT-CC) 30 MG 24 hr tablet TAKE 1 TABLET EVERY DAY  90 tablet  0  . tiotropium  (SPIRIVA) 18 MCG inhalation capsule Place 18 mcg into inhaler and inhale daily.          No Known Allergies  Family History  Problem Relation Age of Onset  . Heart attack Mother 61  . Cancer Sister     "throat" cancer  . Heart disease Other     CAD  . Hypertension Other     BP 122/66  Pulse 68  Temp(Src) 98.2 F (36.8 C) (Oral)  Ht 5\' 6"  (1.676 m)  Wt 136 lb 3.2 oz (61.78 kg)  BMI 21.98 kg/m2  SpO2 97%    Review of Systems  Constitutional: Negative for fever.  HENT: Negative for hearing loss.   Eyes: Negative for visual disturbance.  Respiratory: Negative for shortness of breath.   Cardiovascular: Negative for chest pain.  Gastrointestinal: Negative for anal bleeding.  Genitourinary: Negative for hematuria.  Musculoskeletal: Negative for back pain.  Skin: Negative for rash.  Neurological: Negative for syncope, numbness and headaches.  Hematological: Bruises/bleeds easily.  Psychiatric/Behavioral: Negative for dysphoric mood.   She has lost weight    Objective:   Physical Exam VS: see vs page GEN: no distress HEAD: head: no  deformity eyes: no periorbital swelling, no proptosis external nose and ears are normal mouth: no lesion seen NECK: supple, thyroid is not enlarged CHEST WALL: no deformity LUNGS:  Clear to auscultation BREASTS:  sees gyn CV: reg rate and rhythm, no murmur ABD: abdomen is soft, nontender.  no hepatosplenomegaly.  not distended.  no hernia GENITALIA/RECTAL: sees gyn MUSCULOSKELETAL: muscle bulk and strength are grossly normal.  no obvious joint swelling.  gait is normal and steady EXTEMITIES: no deformity.  no ulcer on the feet.  feet are of normal color and temp.  no edema. There is bilateral onychomycosis, and bilat varicosities.  PULSES: dorsalis pedis intact bilat.  no carotid bruit NEURO:  cn 2-12 grossly intact.   readily moves all 4's.  sensation is intact to touch on the feet SKIN:  Normal texture and temperature.  No rash or  suspicious lesion is visible.   NODES:  None palpable at the neck PSYCH: alert, oriented x3.  Does not appear anxious nor depressed.  V-Z IgG is pos    Assessment & Plan:  Wellness visit today, with problems stable, except as noted.

## 2011-07-06 NOTE — Patient Instructions (Addendum)
blood tests are being requested for you today.  please call [phone] to hear your test results.  You will be prompted to enter the 9-digit "MRN" number that appears at the top left of this page, followed by #.  Then you will hear the message.   good diet and exercise habits significanly improve the control of your diabetes.  please let me know if you wish to be referred to a dietician.  high blood sugar is very risky to your health.  you should see an eye doctor every year. controlling your blood pressure and cholesterol drastically reduces the damage diabetes does to your body.  this also applies to quitting smoking.  please discuss these with your doctor.  you should take an aspirin every day, unless you have been advised by a doctor not to. please consider these measures for your health:  minimize alcohol.  do not use tobacco products.  have a colonoscopy at least every 10 years from age 38.  Women should have an annual mammogram from age 47.  keep firearms safely stored.  always use seat belts.  have working smoke alarms in your home.  see an eye doctor and dentist regularly.  never drive under the influence of alcohol or drugs (including prescription drugs).  those with fair skin should take precautions against the sun. please let me know what your wishes would be, if artificial life support measures should become necessary.  it is critically important to prevent falling down (keep floor areas well-lit, dry, and free of loose objects.  If you have a cane, walker, or wheelchair, you should use it, even for short trips around the house.  Also, try not to rush).   Please come back for a follow-up appointment in 6 months.  (update: i left message on phone-tree:  You have been exposed to V-Z.  You should have zostavax)

## 2011-07-07 LAB — VARICELLA ZOSTER ANTIBODY, IGG: Varicella IgG: 2.46 {ISR} — ABNORMAL HIGH

## 2011-07-07 LAB — PTH, INTACT AND CALCIUM
Calcium, Total (PTH): 9.8 mg/dL (ref 8.4–10.5)
PTH: 79.2 pg/mL — ABNORMAL HIGH (ref 14.0–72.0)

## 2011-07-13 ENCOUNTER — Ambulatory Visit (INDEPENDENT_AMBULATORY_CARE_PROVIDER_SITE_OTHER)
Admission: RE | Admit: 2011-07-13 | Discharge: 2011-07-13 | Disposition: A | Discharge status: Home / Self Care | Payer: PRIVATE HEALTH INSURANCE | Source: Ambulatory Visit

## 2011-07-13 DIAGNOSIS — M810 Age-related osteoporosis without current pathological fracture: Secondary | ICD-10-CM

## 2011-07-22 ENCOUNTER — Other Ambulatory Visit: Payer: Self-pay | Admitting: Endocrinology

## 2011-08-21 DIAGNOSIS — C801 Malignant (primary) neoplasm, unspecified: Secondary | ICD-10-CM | POA: Insufficient documentation

## 2011-08-25 ENCOUNTER — Encounter: Payer: Self-pay | Admitting: *Deleted

## 2011-08-25 DIAGNOSIS — C50919 Malignant neoplasm of unspecified site of unspecified female breast: Secondary | ICD-10-CM | POA: Insufficient documentation

## 2011-08-25 DIAGNOSIS — Z923 Personal history of irradiation: Secondary | ICD-10-CM | POA: Insufficient documentation

## 2011-08-26 ENCOUNTER — Ambulatory Visit
Admission: RE | Admit: 2011-08-26 | Discharge: 2011-08-26 | Disposition: A | Discharge status: Home / Self Care | Payer: PRIVATE HEALTH INSURANCE | Source: Ambulatory Visit | Attending: Radiation Oncology | Admitting: Radiation Oncology

## 2011-08-26 DIAGNOSIS — C3410 Malignant neoplasm of upper lobe, unspecified bronchus or lung: Secondary | ICD-10-CM | POA: Insufficient documentation

## 2011-08-26 LAB — CREATININE, SERUM: Creatinine, Ser: 0.74 mg/dL (ref 0.50–1.10)

## 2011-08-26 LAB — BUN: BUN: 22 mg/dL (ref 6–23)

## 2011-08-27 ENCOUNTER — Ambulatory Visit (HOSPITAL_COMMUNITY)
Admission: RE | Admit: 2011-08-27 | Discharge: 2011-08-27 | Disposition: A | Discharge status: Home / Self Care | Payer: PRIVATE HEALTH INSURANCE | Source: Ambulatory Visit | Attending: Radiation Oncology | Admitting: Radiation Oncology

## 2011-08-27 DIAGNOSIS — I2510 Atherosclerotic heart disease of native coronary artery without angina pectoris: Secondary | ICD-10-CM | POA: Insufficient documentation

## 2011-08-27 DIAGNOSIS — Z853 Personal history of malignant neoplasm of breast: Secondary | ICD-10-CM | POA: Insufficient documentation

## 2011-08-27 DIAGNOSIS — C3410 Malignant neoplasm of upper lobe, unspecified bronchus or lung: Secondary | ICD-10-CM | POA: Insufficient documentation

## 2011-08-27 DIAGNOSIS — J984 Other disorders of lung: Secondary | ICD-10-CM | POA: Insufficient documentation

## 2011-08-27 DIAGNOSIS — Z923 Personal history of irradiation: Secondary | ICD-10-CM | POA: Insufficient documentation

## 2011-08-27 DIAGNOSIS — Z9010 Acquired absence of unspecified breast and nipple: Secondary | ICD-10-CM | POA: Insufficient documentation

## 2011-08-27 DIAGNOSIS — I700 Atherosclerosis of aorta: Secondary | ICD-10-CM | POA: Insufficient documentation

## 2011-08-27 DIAGNOSIS — C3490 Malignant neoplasm of unspecified part of unspecified bronchus or lung: Secondary | ICD-10-CM

## 2011-08-27 MED ORDER — IOHEXOL 300 MG/ML  SOLN
80.0000 mL | Freq: Once | INTRAMUSCULAR | Status: AC | PRN
  Administered 2011-08-27: 80 mL via INTRAVENOUS

## 2011-08-28 ENCOUNTER — Ambulatory Visit
Admission: RE | Admit: 2011-08-28 | Discharge: 2011-08-28 | Disposition: A | Discharge status: Home / Self Care | Payer: Medicare Other | Source: Ambulatory Visit | Attending: Radiation Oncology | Admitting: Radiation Oncology

## 2011-08-28 ENCOUNTER — Encounter: Payer: Self-pay | Admitting: Radiation Oncology

## 2011-08-28 VITALS — BP 120/71 | HR 68 | Temp 98.1°F | Resp 20 | Wt 136.7 lb

## 2011-08-28 DIAGNOSIS — C3410 Malignant neoplasm of upper lobe, unspecified bronchus or lung: Secondary | ICD-10-CM

## 2011-08-28 NOTE — Progress Notes (Signed)
CC:   Ines Bloomer, M.D.  DIAGNOSIS:  Stage I non-small-cell lung cancer of the right upper lobe.  INTERVAL HISTORY:  Ms. Schnieders returns to clinic today for routine followup.  She recently completed a CT scan of the chest on 01/24.  This showed stable nodules within the right upper lobe with no sign of progression or significant change.  Ms. Broecker states that she is doing very well at this time.  She denies any significant shortness of breath or cough and denies any pain in the chest or elsewhere.  PHYSICAL EXAMINATION:  Vital signs:  Weight 136 pounds.  Blood pressure 120/71, pulse 68, temperature 98.1, respiratory rate 20.  General:  Well- developed female in no acute distress.  Neck:  Supple without any lymphadenopathy.  Cardiovascular:  Regular rate and rhythm. Respiratory:  Clear to auscultation bilaterally.  GI:  Abdomen soft, nontender, normal bowel sounds.  Extremities:  No edema present.  IMPRESSION AND PLAN:  Ms. Walko continues to do well and her CT scan looked good with no sign of recurrence or progressive disease.  I will have her undergo a repeat CT scan of the chest in another 6 months with followup after this.    ______________________________ Radene Gunning, M.D., Ph.D. JSM/MEDQ  D:  08/28/2011  T:  08/28/2011  Job:  [id number]

## 2011-08-28 NOTE — Progress Notes (Addendum)
Pt denies SOB, cough, pain, any other problems, complaints. She never uses O2 but has Spiriva inhaler. She states she has been healthy since last FU on 02/27/11.

## 2011-08-31 ENCOUNTER — Telehealth: Payer: Self-pay | Admitting: *Deleted

## 2011-08-31 HISTORY — PX: LUNG BIOPSY: SHX232

## 2011-09-08 ENCOUNTER — Encounter: Payer: Self-pay | Admitting: Gastroenterology

## 2011-09-21 ENCOUNTER — Other Ambulatory Visit: Payer: Self-pay | Admitting: Endocrinology

## 2011-09-25 NOTE — Telephone Encounter (Signed)
xxx

## 2011-10-08 ENCOUNTER — Encounter: Payer: Self-pay | Admitting: Gastroenterology

## 2011-10-08 ENCOUNTER — Ambulatory Visit (AMBULATORY_SURGERY_CENTER): Payer: PRIVATE HEALTH INSURANCE | Admitting: *Deleted

## 2011-10-08 VITALS — Ht 66.0 in | Wt 139.3 lb

## 2011-10-08 DIAGNOSIS — Z1211 Encounter for screening for malignant neoplasm of colon: Secondary | ICD-10-CM

## 2011-10-08 MED ORDER — PEG-KCL-NACL-NASULF-NA ASC-C 100 G PO SOLR: ORAL | Status: DC

## 2011-10-22 ENCOUNTER — Other Ambulatory Visit: Payer: PRIVATE HEALTH INSURANCE | Admitting: Gastroenterology

## 2011-11-25 ENCOUNTER — Encounter: Payer: Self-pay | Admitting: Gastroenterology

## 2011-11-25 ENCOUNTER — Ambulatory Visit (AMBULATORY_SURGERY_CENTER): Payer: PRIVATE HEALTH INSURANCE | Admitting: Gastroenterology

## 2011-11-25 VITALS — HR 82 | Temp 98.7°F | Resp 18 | Ht 66.0 in | Wt 139.0 lb

## 2011-11-25 DIAGNOSIS — Z86010 Personal history of colonic polyps: Secondary | ICD-10-CM

## 2011-11-25 DIAGNOSIS — K635 Polyp of colon: Secondary | ICD-10-CM

## 2011-11-25 DIAGNOSIS — D126 Benign neoplasm of colon, unspecified: Secondary | ICD-10-CM

## 2011-11-25 DIAGNOSIS — Z1211 Encounter for screening for malignant neoplasm of colon: Secondary | ICD-10-CM

## 2011-11-25 LAB — GLUCOSE, CAPILLARY
Glucose-Capillary: 105 mg/dL — ABNORMAL HIGH (ref 70–99)
Glucose-Capillary: 106 mg/dL — ABNORMAL HIGH (ref 70–99)

## 2011-11-25 MED ORDER — SODIUM CHLORIDE 0.9 % IV SOLN: 500.0000 mL | INTRAVENOUS | Status: DC

## 2011-11-25 NOTE — Progress Notes (Signed)
Patient did not experience any of the following events: a burn prior to discharge; a fall within the facility; wrong site/side/patient/procedure/implant event; or a hospital transfer or hospital admission upon discharge from the facility. (G8907) Patient did not have preoperative order for IV antibiotic SSI prophylaxis. (G8918)  

## 2011-11-25 NOTE — Op Note (Signed)
[facility] [address] [place], [place]  [address]  COLONOSCOPY PROCEDURE REPORT  PATIENT:  Charlotte Harris, Charlotte Harris  MR#:  [id number] BIRTHDATE:  May 14, 1932, 79 yrs. old  GENDER:  female ENDOSCOPIST:  Judie Petit T. Russella Dar, MD, [facility]  PROCEDURE DATE:  11/25/2011 PROCEDURE:  Colonoscopy with biopsy and snare polypectomy ASA CLASS:  Class III INDICATIONS:  1) surveillance and high-risk screening  2) history of adenomatous colon polyps: 12/2000, TVA: 08/2008 MEDICATIONS:   MAC sedation, administered by CRNA, propofol (Diprivan) 230 mg IV DESCRIPTION OF PROCEDURE:   After the risks benefits and alternatives of the procedure were thoroughly explained, informed consent was obtained.  Digital rectal exam was performed and revealed no abnormalities.   The LB CF-H180AL [id number] endoscope was introduced through the anus and advanced to the cecum, which was identified by both the appendix and ileocecal valve, without limitations.  The quality of the prep was good, using MoviPrep. The instrument was then slowly withdrawn as the colon was fully examined. <<PROCEDUREIMAGES>>  FINDINGS:  A sessile polyp was found in the cecum. It was 4 mm in size. The polyp was removed using cold biopsy forceps.  A sessile polyp was found at the ileocecal valve. It was 5 mm in size. Polyp was snared without cautery. Retrieval was successful. A sessile polyp was found in the ascending colon. It was 7 mm in size. Polyp was snared without cautery. Retrieval was successful. Two polyps were found at the hepatic flexure. They were 7 mm in size. Polyps were snared without cautery. Retrieval was successful.  Two polyps were found in the mid transverse colon. They were 6 mm in size. Polyps were snared without cautery. Retrieval was successful.  A sessile polyp was found in the sigmoid colon. It was 5 mm in size. Polyp was snared without cautery. Retrieval was successful.  A lipoma was found in the ascending colon. It  was smooth, soft and submucosal. It was 2 cm in size.  A lipoma was found at the hepatic flexure. It was smooth and submucosal. It was 1.2 cm in size.  A lipoma was found in the sigmoid colon. It was smooth and submucosal. It was 1 cm in size.  Scattered diverticula were found in the sigmoid to ascending colon. Otherwise normal colonoscopy without other polyps, masses, vascular ectasias, or inflammatory changes.   Retroflexed views in the rectum revealed no abnormalities. The time to cecum =  2.5  minutes. The scope was then withdrawn (time =  13.25  min) from the patient and the procedure completed.  COMPLICATIONS:  None  ENDOSCOPIC IMPRESSION: 1) 4 mm sessile polyp in the cecum 2) 5 mm sessile polyp at the ileocecal valve 3) 7 mm sessile polyp in the ascending colon 4) 7 mm Two polyps at the hepatic flexure 5) 6 mm Two polyps in the mid transverse colon 6) 5 mm sessile polyp in the sigmoid colon 7) 2 cm lipoma in the ascending colon 8) 1.2 cm lipoma at the hepatic flexure 9) 1 cm lipoma in the sigmoid colon 10) Diverticula, scattered,  sigmoid to ascending colon  RECOMMENDATIONS: 1) Await pathology results 2) High fiber diet with liberal fluid intake. 3) Repeat Colonoscopy in 2 years.  Venita Lick. Russella Dar, MD, [facility]  n. eSIGNED:   Venita Lick. Lori Popowski at 11/25/2011 11:26 AM  Maximiano Coss, [phone]

## 2011-11-25 NOTE — Patient Instructions (Signed)

## 2011-11-26 ENCOUNTER — Telehealth: Payer: Self-pay

## 2011-11-26 NOTE — Telephone Encounter (Signed)
  Follow up Call-  Call back number 11/25/2011  Post procedure Call Back phone  # [phone]  Permission to leave phone message Yes     Patient questions:  Do you have a fever, pain , or abdominal swelling? no Pain Score  0 *  Have you tolerated food without any problems? yes  Have you been able to return to your normal activities? yes  Do you have any questions about your discharge instructions: Diet   no Medications  no Follow up visit  no  Do you have questions or concerns about your Care? no  Actions: * If pain score is 4 or above: No action needed, pain <4.

## 2011-11-30 ENCOUNTER — Encounter: Payer: Self-pay | Admitting: Gastroenterology

## 2011-12-11 ENCOUNTER — Encounter (HOSPITAL_BASED_OUTPATIENT_CLINIC_OR_DEPARTMENT_OTHER): Payer: Self-pay | Admitting: *Deleted

## 2011-12-11 ENCOUNTER — Emergency Department (HOSPITAL_BASED_OUTPATIENT_CLINIC_OR_DEPARTMENT_OTHER)
Admission: EM | Admit: 2011-12-11 | Discharge: 2011-12-11 | Disposition: A | Discharge status: Home / Self Care | Payer: PRIVATE HEALTH INSURANCE | Attending: Emergency Medicine | Admitting: Emergency Medicine

## 2011-12-11 DIAGNOSIS — J449 Chronic obstructive pulmonary disease, unspecified: Secondary | ICD-10-CM | POA: Insufficient documentation

## 2011-12-11 DIAGNOSIS — R040 Epistaxis: Secondary | ICD-10-CM | POA: Insufficient documentation

## 2011-12-11 DIAGNOSIS — J4489 Other specified chronic obstructive pulmonary disease: Secondary | ICD-10-CM | POA: Insufficient documentation

## 2011-12-11 DIAGNOSIS — Z85118 Personal history of other malignant neoplasm of bronchus and lung: Secondary | ICD-10-CM | POA: Insufficient documentation

## 2011-12-11 DIAGNOSIS — E785 Hyperlipidemia, unspecified: Secondary | ICD-10-CM | POA: Insufficient documentation

## 2011-12-11 DIAGNOSIS — Z860100 Personal history of colon polyps, unspecified: Secondary | ICD-10-CM | POA: Insufficient documentation

## 2011-12-11 DIAGNOSIS — I10 Essential (primary) hypertension: Secondary | ICD-10-CM | POA: Insufficient documentation

## 2011-12-11 DIAGNOSIS — E119 Type 2 diabetes mellitus without complications: Secondary | ICD-10-CM | POA: Insufficient documentation

## 2011-12-11 DIAGNOSIS — E890 Postprocedural hypothyroidism: Secondary | ICD-10-CM | POA: Insufficient documentation

## 2011-12-11 DIAGNOSIS — M810 Age-related osteoporosis without current pathological fracture: Secondary | ICD-10-CM | POA: Insufficient documentation

## 2011-12-11 DIAGNOSIS — M1990 Unspecified osteoarthritis, unspecified site: Secondary | ICD-10-CM | POA: Insufficient documentation

## 2011-12-11 DIAGNOSIS — Z853 Personal history of malignant neoplasm of breast: Secondary | ICD-10-CM | POA: Insufficient documentation

## 2011-12-11 DIAGNOSIS — Z7982 Long term (current) use of aspirin: Secondary | ICD-10-CM | POA: Insufficient documentation

## 2011-12-11 DIAGNOSIS — Z79899 Other long term (current) drug therapy: Secondary | ICD-10-CM | POA: Insufficient documentation

## 2011-12-11 MED ORDER — OXYMETAZOLINE HCL 0.05 % NA SOLN
1.0000 | Freq: Once | NASAL | Status: AC
  Administered 2011-12-11: 1 via NASAL
  Filled 2011-12-11: qty 15

## 2011-12-11 NOTE — ED Provider Notes (Signed)
History     CSN: [id number]  Arrival date & time 12/11/11  1955   First MD Initiated Contact with Patient 12/11/11 2007      Chief Complaint  Patient presents with  . Epistaxis    (Consider location/radiation/quality/duration/timing/severity/associated sxs/prior treatment) HPI Comments: Pt states that she had an episode last night which stopped on its own, but now she just can't quite get it stopped  Patient is a 78 y.o. female presenting with nosebleeds. The history is provided by the patient. No language interpreter was used.  Epistaxis  This is a new problem. The current episode started 3 to 5 hours ago. The problem occurs constantly. The problem has been gradually improving. The problem is associated with an unknown factor. The bleeding has been from both nares. She has tried applying pressure for the symptoms. Her past medical history is significant for allergies.    Past Medical History  Diagnosis Date  . Hypertension   . ASYMPTOMATIC POSTMENOPAUSAL STATUS 07/02/2009  . BREAST CANCER, HX OF 04/16/2007  . COLONIC POLYPS, HX OF 04/16/2007  . COPD 05/17/2008  . HEMATURIA, HX OF 04/16/2007  . HYPERLIPIDEMIA 04/16/2007  . HYPERTENSION 04/16/2007  . HYPOTHYROIDISM, POST-RADIATION 06/07/2008  . OSTEOPOROSIS 07/02/2009  . PERIPHERAL EDEMA 02/15/2009  . Osteoarthritis   . AODM 06/21/2007  . History of radiation therapy 07/30/09 to 08/09/09    RUL lung  . Cancer   . ADENOCARCINOMA, LUNG 07/03/2010    RUL  . Breast cancer 1991    R mastectomy, no chemo or radiation    Past Surgical History  Procedure Date  . Mastectomy 1991    right  . Tonsillectomy 1955    Family History  Problem Relation Age of Onset  . Heart attack Mother 43  . Cancer Sister     "throat" cancer  . Heart disease Other     CAD  . Hypertension Other   . Colon cancer Neg Hx   . Stomach cancer Neg Hx     History  Substance Use Topics  . Smoking status: Former Smoker -- 0.5 packs/day for 50 years   Quit date: 08/03/2008  . Smokeless tobacco: Not on file  . Alcohol Use: No    OB History    Grav Para Term Preterm Abortions TAB SAB Ect Mult Living                  Review of Systems  HENT: Positive for nosebleeds.   Respiratory: Negative.   Cardiovascular: Negative.   Neurological: Negative.     Allergies  Review of patient's allergies indicates no known allergies.  Home Medications   Current Outpatient Rx  Name Route Sig Dispense Refill  . ASPIRIN 81 MG PO TABS Oral Take 81 mg by mouth daily.      . ATENOLOL 25 MG PO TABS  TAKE 1 BY MOUTH TWO TIMES A DAY 180 tablet 3  . LISINOPRIL 40 MG PO TABS  TAKE 1 BY MOUTH ONCE DAILY 90 tablet 2  . METFORMIN HCL ER 500 MG PO TB24 Oral Take 1 tablet (500 mg total) by mouth daily. 90 tablet 3  . NIFEDIPINE ER OSMOTIC 30 MG PO TB24  TAKE 1 TABLET EVERY DAY 90 tablet 3  . PEG-KCL-NACL-NASULF-NA ASC-C 100 G PO SOLR  moviprep as directed 1 kit 0  . ROSUVASTATIN CALCIUM 20 MG PO TABS Oral Take 1 tablet (20 mg total) by mouth daily. 90 tablet 3  . SYNTHROID 50 MCG PO TABS  TAKE 1 BY MOUTH ONCE DAILY 90 tablet 2  . TIOTROPIUM BROMIDE MONOHYDRATE 18 MCG IN CAPS Inhalation Place 1 capsule (18 mcg total) into inhaler and inhale daily. 30 capsule 11    BP 134/67  Pulse 101  Temp(Src) 98.5 F (36.9 C) (Oral)  Resp 16  Ht 5\' 6"  (1.676 m)  Wt 135 lb (61.236 kg)  BMI 21.79 kg/m2  SpO2 92%  Physical Exam  Nursing note and vitals reviewed. Constitutional: She appears well-developed and well-nourished.  HENT:  Right Ear: External ear normal.  Left Ear: External ear normal.       Small trickle noted out of bilateral nares  Cardiovascular: Normal rate and regular rhythm.   Pulmonary/Chest: Effort normal and breath sounds normal.  Musculoskeletal: Normal range of motion.  Neurological: She is alert.  Skin: Skin is warm and dry.    ED Course  Procedures (including critical care time)  Labs Reviewed - No data to display No results  found.   1. Epistaxis       MDM  Symptoms resolved after the afrin and ice:pt given instructions and follow up        Teressa Lower, NP 12/11/11 2100

## 2011-12-11 NOTE — Discharge Instructions (Signed)

## 2011-12-11 NOTE — ED Provider Notes (Signed)
History/physical exam/procedure(s) were performed by non-physician practitioner and as supervising physician I was immediately available for consultation/collaboration. I have reviewed all notes and am in agreement with care and plan.   Stewart Sasaki S Azriel Jakob, MD 12/11/11 2341 

## 2011-12-11 NOTE — ED Notes (Signed)
Pt reports having a nose bleed last night that stopped on its own. States that another one developed around 1845 this evening. States that she has tried to pinch her nose, but no success. Bilateral nostrils actively bleeding. Pt denies dizziness or light headedness with the nose bleed.

## 2012-02-22 ENCOUNTER — Ambulatory Visit
Admission: RE | Admit: 2012-02-22 | Discharge: 2012-02-22 | Disposition: A | Discharge status: Home / Self Care | Payer: PRIVATE HEALTH INSURANCE | Source: Ambulatory Visit | Attending: Radiation Oncology | Admitting: Radiation Oncology

## 2012-02-22 DIAGNOSIS — C3410 Malignant neoplasm of upper lobe, unspecified bronchus or lung: Secondary | ICD-10-CM | POA: Insufficient documentation

## 2012-02-22 LAB — BUN: BUN: 17 mg/dL (ref 6–23)

## 2012-02-22 LAB — CREATININE, SERUM: Creatinine, Ser: 0.84 mg/dL (ref 0.50–1.10)

## 2012-02-25 ENCOUNTER — Ambulatory Visit (HOSPITAL_COMMUNITY)
Admission: RE | Admit: 2012-02-25 | Discharge: 2012-02-25 | Disposition: A | Discharge status: Home / Self Care | Payer: PRIVATE HEALTH INSURANCE | Source: Ambulatory Visit | Attending: Radiation Oncology | Admitting: Radiation Oncology

## 2012-02-25 DIAGNOSIS — Z923 Personal history of irradiation: Secondary | ICD-10-CM | POA: Insufficient documentation

## 2012-02-25 DIAGNOSIS — C3410 Malignant neoplasm of upper lobe, unspecified bronchus or lung: Secondary | ICD-10-CM

## 2012-02-25 DIAGNOSIS — R911 Solitary pulmonary nodule: Secondary | ICD-10-CM | POA: Insufficient documentation

## 2012-02-25 DIAGNOSIS — Z85118 Personal history of other malignant neoplasm of bronchus and lung: Secondary | ICD-10-CM | POA: Insufficient documentation

## 2012-02-25 DIAGNOSIS — I77819 Aortic ectasia, unspecified site: Secondary | ICD-10-CM | POA: Insufficient documentation

## 2012-02-25 DIAGNOSIS — I2510 Atherosclerotic heart disease of native coronary artery without angina pectoris: Secondary | ICD-10-CM | POA: Insufficient documentation

## 2012-02-25 DIAGNOSIS — N289 Disorder of kidney and ureter, unspecified: Secondary | ICD-10-CM | POA: Insufficient documentation

## 2012-02-25 MED ORDER — IOHEXOL 300 MG/ML  SOLN
80.0000 mL | Freq: Once | INTRAMUSCULAR | Status: AC | PRN
  Administered 2012-02-25: 80 mL via INTRAVENOUS

## 2012-02-26 ENCOUNTER — Ambulatory Visit: Payer: PRIVATE HEALTH INSURANCE | Admitting: Radiation Oncology

## 2012-03-04 ENCOUNTER — Encounter: Payer: Self-pay | Admitting: Radiation Oncology

## 2012-03-04 ENCOUNTER — Ambulatory Visit
Admission: RE | Admit: 2012-03-04 | Discharge: 2012-03-04 | Disposition: A | Discharge status: Home / Self Care | Payer: Medicare Other | Source: Ambulatory Visit | Attending: Radiation Oncology | Admitting: Radiation Oncology

## 2012-03-04 VITALS — BP 125/75 | HR 76 | Temp 98.7°F | Resp 20 | Wt 134.3 lb

## 2012-03-04 DIAGNOSIS — C3410 Malignant neoplasm of upper lobe, unspecified bronchus or lung: Secondary | ICD-10-CM

## 2012-03-04 NOTE — Progress Notes (Signed)
Patient arrived, alert,oriented x 3  ambulatory,steady gait, no coughing, s/p rad txs=07/30/09-08/09/09,  Right upper lung denys any pain,nausea,sob, eating and drinking well, last ct chest 02/25/12 results in

## 2012-03-05 DIAGNOSIS — C3411 Malignant neoplasm of upper lobe, right bronchus or lung: Secondary | ICD-10-CM | POA: Insufficient documentation

## 2012-03-05 NOTE — Progress Notes (Signed)
Radiation Oncology         [phone] ________________________________  Name: Charlotte Harris MRN: [id number]  Date: 03/04/2012  DOB: May 31, 1932  Follow-Up Visit Note  CC: Romero Belling, MD  Ines Bloomer, MD  Diagnosis:   Date 1 non-small cell lung cancer of the right upper low  Interval Since Last Radiation:  Approximately 2-1/2 years   Narrative:  The patient returns today for routine follow-up.  The patient indicates that she is doing very well symptomatically. No new complaints. No shortness of breath and no chest pain. The patient did have a recent CT scan of the chest completed on 02/25/2012. There was an interval change and enlargement of the previously noted right upper lobe pulmonary nodule. This is felt to be concerning for recurrent disease. The other previously noted groundglass attenuation nodule in the right upper lobe was unchanged.                              ALLERGIES:   has no known allergies.  Meds: Current Outpatient Prescriptions  Medication Sig Dispense Refill  . aspirin 81 MG tablet Take 81 mg by mouth daily.        Marland Kitchen atenolol (TENORMIN) 25 MG tablet TAKE 1 BY MOUTH TWO TIMES A DAY  180 tablet  3  . lisinopril (PRINIVIL,ZESTRIL) 40 MG tablet TAKE 1 BY MOUTH ONCE DAILY  90 tablet  2  . metFORMIN (GLUCOPHAGE-XR) 500 MG 24 hr tablet Take 1 tablet (500 mg total) by mouth daily.  90 tablet  3  . NIFEdipine (PROCARDIA XL/ADALAT-CC) 30 MG 24 hr tablet TAKE 1 TABLET EVERY DAY  90 tablet  3  . rosuvastatin (CRESTOR) 20 MG tablet Take 1 tablet (20 mg total) by mouth daily.  90 tablet  3  . SYNTHROID 50 MCG tablet TAKE 1 BY MOUTH ONCE DAILY  90 tablet  2  . tiotropium (SPIRIVA) 18 MCG inhalation capsule Place 1 capsule (18 mcg total) into inhaler and inhale daily.  30 capsule  11  . peg 3350 powder (MOVIPREP) 100 G SOLR moviprep as directed  1 kit  0    Physical Findings: The patient is in no acute distress. Patient is alert and oriented.  weight is 134 lb 4.8  oz (60.918 kg). Her oral temperature is 98.7 F (37.1 C). Her blood pressure is 125/75 and her pulse is 76. Her respiration is 20 and oxygen saturation is 92%. .   General: Well-developed, in no acute distress HEENT: Normocephalic, atraumatic Cardiovascular: Regular rate and rhythm Respiratory: Clear to auscultation bilaterally GI: Soft, nontender, normal bowel sounds Extremities: No edema present   Lab Findings: Lab Results  Component Value Date   WBC 5.7 07/06/2011   HGB 12.9 07/06/2011   HCT 38.5 07/06/2011   MCV 96.4 07/06/2011   PLT 200.0 07/06/2011     Radiographic Findings: Ct Chest W Contrast  02/25/2012  *RADIOLOGY REPORT*  Clinical Data: 78 year old female with history of lung cancer status post radiation therapy.  CT CHEST WITH CONTRAST  Technique:  Multidetector CT imaging of the chest was performed following the standard protocol during bolus administration of intravenous contrast.  Contrast: 80mL OMNIPAQUE IOHEXOL 300 MG/ML  SOLN  Comparison: Chest CT 08/27/2011.  Findings:  Mediastinum: Heart size is normal. There is no significant pericardial fluid, thickening or pericardial calcification. There is atherosclerosis of the thoracic aorta, the great vessels of the mediastinum and the coronary arteries, including  calcified atherosclerotic plaque in the left main, left anterior descending, left circumflex and right coronary arteries. No pathologically enlarged mediastinal or hilar lymph nodes. Esophagus is unremarkable in appearance.  Ectasia of the ascending thoracic aorta (3.9 cm in diameter)  Lungs/Pleura: Compared to the prior examination 08/27/2011, there has been significant interval increase in wedge shaped nodular peripheral opacity in the anterior aspect of the right upper lobe which currently measures up to 2.3 x 2.2 cm (previously 0.8 x 1.3 cm).  There is associated thickening of the peribronchovascular interstitium extending toward the right hilum, and extensive surrounding  architectural distortion.  Previously noted satellite nodules in the right upper lobe (image 15 of series 5) is slightly larger measuring 5 mm.  Ground-glass attenuation nodule in the right upper lobe (image 20 of series 5) is essentially unchanged measuring 1.9 x 1.0 cm (previously 1.8 by 1.1 cm).  4 mm nodule in the right middle lobe (image 45 of series 5) slightly increased compared to the prior examination. Linear opacity in the anterior segment of the lingula is compatible with scarring.  Upper Abdomen: Large low attenuation lesion in the right kidney is incompletely visualized but measures at least 5.0 cm, similar to prior examinations, and likely representing a large cyst.  Smaller low attenuation lesions are also seen in the kidneys bilaterally which are too small to definitively characterize, but are similar to priors.  Musculoskeletal: Status post right-sided modified radical mastectomy and axillary nodal dissection.  No focal soft tissue mass in the right chest wall or definite right axillary lymphadenopathy identified. There are no aggressive appearing lytic or blastic lesions noted in the visualized portions of the skeleton.  IMPRESSION: 1.  Interval change in appearance and enlargement of previously noted right upper lobe pulmonary nodule, highly concerning for recurrence of disease (per report, radiation therapy was completed in January 2011, excluding evolving postradiation changes as a reasonable potential cause of this finding).  Further evaluation with PET CT or biopsy is recommended to exclude recurrence of disease. 2.  Other previously noted ground-glass attenuation nodule in the right upper lobe is unchanged measuring 1.9 x 1.0 cm on today's examination. 3. Atherosclerosis, including left main and three-vessel coronary artery disease.  In addition, there is ectasia of the ascending thoracic aorta (3.9 cm in diameter). 4.  Additional incidental findings, as above.  These results will be called  to the ordering clinician or representative by the Radiologist Assistant, and communication documented in the PACS Dashboard.  Original Report Authenticated By: Florencia Reasons, M.D.    Impression:    78 year old female with a history of radiosurgery for stage I non-small cell lung cancer of the right upper lobe. Her recent CT scan on followup is worrisome for possible recurrence.  Plan:  I discussed this finding with the patient. We had a detailed discussion of this. At this time I would recommend proceeding with a PET scan. I will then have her return to followup after this to review results and plan further evaluation as necessary.  I spent 15 minutes with the patient today, the majority of which was spent counseling the patient on the diagnosis of cancer and coordinating care.   Radene Gunning, M.D., Ph.D.

## 2012-03-10 ENCOUNTER — Telehealth: Payer: Self-pay | Admitting: *Deleted

## 2012-03-10 ENCOUNTER — Other Ambulatory Visit: Payer: Self-pay

## 2012-03-10 NOTE — Telephone Encounter (Signed)
Patient called and confirmed  Her appt for a Pet Scan, for 03/14/12 and will be at [facility] long radiology at 0715 am, she reconfirmed  no candy,gum, sugar or food but can take oral Glucophage with sips water only,  1:19 PM

## 2012-03-10 NOTE — Telephone Encounter (Signed)
Called  Radiology scheduler,spoke with Tiffany, Pet Scan is scheduled for 03/14/12 at 0730 am,patient to arrive at 0715am, npo after midnight, patient can take oral Glucophage with sips of water, no food, candy,gum  Before pet scan, left voice message for patient to call to confirm this message, patient pre-cert is approved per Delice Bison, 11:34 AM

## 2012-03-14 ENCOUNTER — Encounter (HOSPITAL_COMMUNITY)
Admission: RE | Admit: 2012-03-14 | Discharge: 2012-03-14 | Disposition: A | Discharge status: Home / Self Care | Payer: PRIVATE HEALTH INSURANCE | Source: Ambulatory Visit | Attending: Radiation Oncology | Admitting: Radiation Oncology

## 2012-03-14 DIAGNOSIS — J984 Other disorders of lung: Secondary | ICD-10-CM | POA: Insufficient documentation

## 2012-03-14 DIAGNOSIS — I6529 Occlusion and stenosis of unspecified carotid artery: Secondary | ICD-10-CM | POA: Insufficient documentation

## 2012-03-14 DIAGNOSIS — N281 Cyst of kidney, acquired: Secondary | ICD-10-CM | POA: Insufficient documentation

## 2012-03-14 DIAGNOSIS — M19019 Primary osteoarthritis, unspecified shoulder: Secondary | ICD-10-CM | POA: Insufficient documentation

## 2012-03-14 DIAGNOSIS — M899 Disorder of bone, unspecified: Secondary | ICD-10-CM | POA: Insufficient documentation

## 2012-03-14 DIAGNOSIS — I2510 Atherosclerotic heart disease of native coronary artery without angina pectoris: Secondary | ICD-10-CM | POA: Insufficient documentation

## 2012-03-14 DIAGNOSIS — Z923 Personal history of irradiation: Secondary | ICD-10-CM | POA: Insufficient documentation

## 2012-03-14 DIAGNOSIS — I700 Atherosclerosis of aorta: Secondary | ICD-10-CM | POA: Insufficient documentation

## 2012-03-14 DIAGNOSIS — C3410 Malignant neoplasm of upper lobe, unspecified bronchus or lung: Secondary | ICD-10-CM | POA: Insufficient documentation

## 2012-03-14 LAB — GLUCOSE, CAPILLARY: Glucose-Capillary: 89 mg/dL (ref 70–99)

## 2012-03-14 MED ORDER — FLUDEOXYGLUCOSE F - 18 (FDG) INJECTION
17.1000 | Freq: Once | INTRAVENOUS | Status: AC | PRN
  Administered 2012-03-14: 17.1 via INTRAVENOUS

## 2012-03-21 ENCOUNTER — Telehealth: Payer: Self-pay | Admitting: *Deleted

## 2012-03-21 ENCOUNTER — Ambulatory Visit
Admission: RE | Admit: 2012-03-21 | Payer: PRIVATE HEALTH INSURANCE | Source: Ambulatory Visit | Admitting: Radiation Oncology

## 2012-03-21 NOTE — Telephone Encounter (Signed)
Called patient home phone, no answer,left voice message, patient missed her follow up appt today at 3:30pm, please call to reschedule 4:09 PM

## 2012-03-25 ENCOUNTER — Encounter: Payer: Self-pay | Admitting: *Deleted

## 2012-04-08 ENCOUNTER — Encounter: Payer: Self-pay | Admitting: Radiation Oncology

## 2012-04-08 ENCOUNTER — Ambulatory Visit
Admission: RE | Admit: 2012-04-08 | Discharge: 2012-04-08 | Disposition: A | Discharge status: Home / Self Care | Payer: Medicare Other | Source: Ambulatory Visit | Attending: Radiation Oncology | Admitting: Radiation Oncology

## 2012-04-08 VITALS — BP 133/70 | HR 75 | Temp 98.0°F | Resp 20 | Wt 133.0 lb

## 2012-04-08 DIAGNOSIS — C3410 Malignant neoplasm of upper lobe, unspecified bronchus or lung: Secondary | ICD-10-CM

## 2012-04-08 NOTE — Progress Notes (Signed)
Patient here f/u rad txs lung :07/30/10-08/09/09 Pet scan 03/14/12 results 92% room air sats Alert,oriented x3, no coughing, eating fair, no c/opain, or nausea,

## 2012-04-09 NOTE — Progress Notes (Signed)
Radiation Oncology         [phone] ________________________________  Name: Charlotte Harris MRN: [id number]  Date: 04/08/2012  DOB: May 03, 1932  Follow-Up Visit Note  CC: Romero Belling, MD  Romero Belling, MD Ines Bloomer M.D.  Diagnosis:   Non-small cell lung cancer  Narrative:  The patient returns today for follow-up.  She previously was seen with some suspicious changes within the lung indicative of possible recurrent/progressive disease. The patient therefore underwent a PET scan and returns for discussion of this. This showed moderate hypermetabolism responding to an enlarging soft tissue density within the right upper lobe as well as the adjacent second right rib/chest wall. These findings were felt to be very suspicious for recurrence. The more posterior right upper lobe nodule was not significantly hypermetabolic. Left adrenal hypermetabolism was without a defined lesion which was felt to likely be physiologic. No clear evidence of distant hypermetabolic disease.                              ALLERGIES:   has no known allergies.  Meds: Current Outpatient Prescriptions  Medication Sig Dispense Refill  . aspirin 81 MG tablet Take 81 mg by mouth daily.        Marland Kitchen atenolol (TENORMIN) 25 MG tablet TAKE 1 BY MOUTH TWO TIMES A DAY  180 tablet  3  . lisinopril (PRINIVIL,ZESTRIL) 40 MG tablet TAKE 1 BY MOUTH ONCE DAILY  90 tablet  2  . metFORMIN (GLUCOPHAGE-XR) 500 MG 24 hr tablet Take 1 tablet (500 mg total) by mouth daily.  90 tablet  3  . NIFEdipine (PROCARDIA XL/ADALAT-CC) 30 MG 24 hr tablet TAKE 1 TABLET EVERY DAY  90 tablet  3  . rosuvastatin (CRESTOR) 20 MG tablet Take 1 tablet (20 mg total) by mouth daily.  90 tablet  3  . SYNTHROID 50 MCG tablet TAKE 1 BY MOUTH ONCE DAILY  90 tablet  2  . tiotropium (SPIRIVA) 18 MCG inhalation capsule Place 1 capsule (18 mcg total) into inhaler and inhale daily.  30 capsule  11  . peg 3350 powder (MOVIPREP) 100 G SOLR moviprep as directed  1  kit  0    Physical Findings: The patient is in no acute distress. Patient is alert and oriented.  weight is 133 lb (60.328 kg). Her oral temperature is 98 F (36.7 C). Her blood pressure is 133/70 and her pulse is 75. Her respiration is 20 and oxygen saturation is 92%. .     Lab Findings: Lab Results  Component Value Date   WBC 5.7 07/06/2011   HGB 12.9 07/06/2011   HCT 38.5 07/06/2011   MCV 96.4 07/06/2011   PLT 200.0 07/06/2011     Radiographic Findings: Nm Pet Image Restag (ps) Skull Base To Thigh  03/14/2012  *RADIOLOGY REPORT*  Clinical Data: Subsequent treatment strategy for status post radiation therapy for lung cancer.  Radiosurgery for stage I non small cell lung cancer right upper lobe.  NUCLEAR MEDICINE PET CT SKULL BASE TO THIGH  Technique:  Technique:  17.1 mCi F-18 FDG was injected intravenously. CT data was obtained and used for attenuation correction and anatomic localization only.  (This was not acquired as a diagnostic CT examination.) Additional exam technical data entered on technologist worksheet.  Comparison: Chest CTs of 02/24/2012 and 08/27/2011.  PET of 07/05/2009.  The clinic note of 03/04/2012 is also reviewed.  Findings: Neck: No abnormal hypermetabolism.  Chest:  There is hypermetabolism involving the anterior right upper lobe and adjacent chest wall and 2nd right rib.  This corresponds to rib sclerosis/irregularity and underlying enlarging soft tissue density within the lung.  This measures a S.U.V. max of 5.5, including on image 78.  No abnormal nodal activity within the chest.  Abdomen/Pelvis:  Mild left adrenal hypermetabolism, which measures a S.U.V. max of 5.0.  No well-defined mass.  [place]:  A focus of hypermetabolism involving the inferior aspect of the spinous process of L3.  This measures a S.U.V. max of 6.7, and is without well-defined CT correlate. Left rotator cuff arthropathy.  CT images performed for attenuation correction demonstrate densely calcified  left carotid artery atherosclerosis.  Chest findings deferred to recent chest CT. Multivessel coronary artery atherosclerosis.  The more lateral ground-glass right upper lobe nodule is not significantly hypermetabolic.  Example image 86.  No acute superimposed process.  A right adrenal nodule is low density, and likely an adenoma; 1.3 cm on image 133.  Interpolar right renal cyst.  Dense aortic and branch vessel atherosclerosis. Moderate osteopenia.  IMPRESSION:  1.  Moderate hypermetabolism corresponding to enlarging soft tissue density within the right upper lobe as well as the adjacent 2nd right rib and chest wall.  Findings are highly suspicious for locally recurrent disease.  The chest wall and right rib abnormalities could represent radiation change, or direct chest wall involvement.  Tissue sampling should be considered. 2.  The more posterior lateral right upper lobe ground-glass nodule is not significantly hypermetabolic and remains indeterminate. This could be reevaluated at follow-up. 3.  Left adrenal hypermetabolism without well-defined lesion. Favored to be physiologic. 4.  Spinous process hypermetabolism at L3 is favored to be degenerative, given the location and absence of CT correlate.  If there are spine symptoms to suggest metastatic disease, pre and post contrast lumbar spine MRI could be performed.  Original Report Authenticated By: Consuello Bossier, M.D.    Impression:   78 year old female with imaging findings suggestive of recurrent disease.  Plan:  The patient's case has been discussed at multi-disciplinary thoracic conference. Was Felt that proceeding with a biopsy would be most appropriate at this time. I have discussed this with the patient and she is in agreement. I am therefore going to put in a request for a CT-guided biopsy. At conference, this was felt to be a suitable way to obtain tissue. Depending on the results, we will then contact the patient and proceed accordingly.  I  spent 15 minutes with the patient today, the majority of which was spent counseling the patient on the diagnosis of cancer and coordinating care.   Radene Gunning, M.D., Ph.D.

## 2012-05-16 ENCOUNTER — Other Ambulatory Visit: Payer: Self-pay | Admitting: Radiation Oncology

## 2012-05-16 ENCOUNTER — Telehealth: Payer: Self-pay | Admitting: *Deleted

## 2012-05-16 DIAGNOSIS — C3410 Malignant neoplasm of upper lobe, unspecified bronchus or lung: Secondary | ICD-10-CM

## 2012-05-16 NOTE — Telephone Encounter (Signed)
CALLED PATIENT TO INFORM THAT I HAVE SPOKE WITH [facility] SCHEDULING AND THEY WILL BE IN CONTACT WITH HER TO ARRANGE THIS CT BIOPSY, THEY WILL CALL ME WITH THE APPT. DATE AND TIME, SPOKE WITH THIS PATIENT AND SHE IS AWARE OF THIS.

## 2012-05-23 ENCOUNTER — Encounter (HOSPITAL_COMMUNITY): Payer: Self-pay | Admitting: Pharmacy Technician

## 2012-05-26 ENCOUNTER — Other Ambulatory Visit: Payer: Self-pay | Admitting: Radiology

## 2012-05-27 ENCOUNTER — Other Ambulatory Visit: Payer: Self-pay | Admitting: Radiology

## 2012-05-30 ENCOUNTER — Ambulatory Visit (HOSPITAL_COMMUNITY)
Admission: RE | Admit: 2012-05-30 | Discharge: 2012-05-30 | Disposition: A | Discharge status: Home / Self Care | Payer: Medicare Other | Source: Ambulatory Visit | Attending: Radiation Oncology | Admitting: Radiation Oncology

## 2012-05-30 ENCOUNTER — Encounter (HOSPITAL_COMMUNITY): Payer: Self-pay

## 2012-05-30 ENCOUNTER — Ambulatory Visit (HOSPITAL_COMMUNITY)
Admission: RE | Admit: 2012-05-30 | Discharge: 2012-05-30 | Disposition: A | Discharge status: Home / Self Care | Payer: Medicare Other | Source: Ambulatory Visit | Attending: Diagnostic Radiology | Admitting: Diagnostic Radiology

## 2012-05-30 DIAGNOSIS — Z853 Personal history of malignant neoplasm of breast: Secondary | ICD-10-CM | POA: Insufficient documentation

## 2012-05-30 DIAGNOSIS — C3410 Malignant neoplasm of upper lobe, unspecified bronchus or lung: Secondary | ICD-10-CM | POA: Insufficient documentation

## 2012-05-30 LAB — CBC
HCT: 40.6 % (ref 36.0–46.0)
Hemoglobin: 13.5 g/dL (ref 12.0–15.0)
MCH: 31.2 pg (ref 26.0–34.0)
MCHC: 33.3 g/dL (ref 30.0–36.0)
MCV: 93.8 fL (ref 78.0–100.0)
Platelets: 215 10*3/uL (ref 150–400)
RBC: 4.33 MIL/uL (ref 3.87–5.11)
RDW: 13.4 % (ref 11.5–15.5)
WBC: 7.9 10*3/uL (ref 4.0–10.5)

## 2012-05-30 LAB — PROTIME-INR
INR: 0.84 (ref 0.00–1.49)
Prothrombin Time: 11.5 seconds — ABNORMAL LOW (ref 11.6–15.2)

## 2012-05-30 LAB — APTT: aPTT: 29 seconds (ref 24–37)

## 2012-05-30 MED ORDER — MIDAZOLAM HCL 2 MG/2ML IJ SOLN
INTRAMUSCULAR | Status: AC
  Filled 2012-05-30: qty 6

## 2012-05-30 MED ORDER — FENTANYL CITRATE 0.05 MG/ML IJ SOLN
INTRAMUSCULAR | Status: AC | PRN
  Administered 2012-05-30 (×2): 50 ug via INTRAVENOUS

## 2012-05-30 MED ORDER — HYDROCODONE-ACETAMINOPHEN 5-325 MG PO TABS
1.0000 | ORAL_TABLET | ORAL | Status: DC | PRN
  Filled 2012-05-30: qty 2

## 2012-05-30 MED ORDER — FENTANYL CITRATE 0.05 MG/ML IJ SOLN
INTRAMUSCULAR | Status: AC
  Filled 2012-05-30: qty 6

## 2012-05-30 MED ORDER — SODIUM CHLORIDE 0.9 % IV SOLN
Freq: Once | INTRAVENOUS | Status: AC
  Administered 2012-05-30: 10:00:00 via INTRAVENOUS

## 2012-05-30 MED ORDER — MIDAZOLAM HCL 2 MG/2ML IJ SOLN
INTRAMUSCULAR | Status: AC | PRN
  Administered 2012-05-30 (×2): 0.5 mg via INTRAVENOUS
  Administered 2012-05-30: 1 mg via INTRAVENOUS

## 2012-05-30 NOTE — H&P (Signed)
Charlotte Harris is an 78 y.o. female.   Chief Complaint: right lung mass HPI: Patient with history of lung and breast carcinoma and recent PET scan revealing a hypermetabolic anterior right upper lobe lung mass presents today for CT guided biopsy of the lung mass.  Past Medical History  Diagnosis Date  . Hypertension   . ASYMPTOMATIC POSTMENOPAUSAL STATUS 07/02/2009  . BREAST CANCER, HX OF 04/16/2007  . COLONIC POLYPS, HX OF 04/16/2007  . COPD 05/17/2008  . HEMATURIA, HX OF 04/16/2007  . HYPERLIPIDEMIA 04/16/2007  . HYPERTENSION 04/16/2007  . HYPOTHYROIDISM, POST-RADIATION 06/07/2008  . OSTEOPOROSIS 07/02/2009  . PERIPHERAL EDEMA 02/15/2009  . Osteoarthritis   . AODM 06/21/2007  . History of radiation therapy 07/30/09 to 08/09/09    RUL lung  . Cancer   . ADENOCARCINOMA, LUNG 07/03/2010    RUL  . Breast cancer 1991    R mastectomy, no chemo or radiation    Past Surgical History  Procedure Date  . Mastectomy 1991    right  . Tonsillectomy 1955    Family History  Problem Relation Age of Onset  . Heart attack Mother 7  . Cancer Sister     "throat" cancer  . Heart disease Other     CAD  . Hypertension Other   . Colon cancer Neg Hx   . Stomach cancer Neg Hx    Social History:  reports that she quit smoking about 3 years ago. Her smoking use included Cigarettes. She has a 50 pack-year smoking history. She does not have any smokeless tobacco history on file. She reports that she does not drink alcohol or use illicit drugs.  Allergies: No Known Allergies  Current outpatient prescriptions:acetaminophen (TYLENOL) 500 MG tablet, Take 500 mg by mouth every 6 (six) hours as needed. For pain, Disp: , Rfl: ;  atenolol (TENORMIN) 25 MG tablet, Take 25 mg by mouth 2 (two) times daily., Disp: , Rfl: ;  levothyroxine (SYNTHROID, LEVOTHROID) 50 MCG tablet, Take 50 mcg by mouth every morning., Disp: , Rfl: ;  lisinopril (PRINIVIL,ZESTRIL) 40 MG tablet, Take 40 mg by mouth every evening., Disp:  , Rfl:  metFORMIN (GLUCOPHAGE-XR) 500 MG 24 hr tablet, Take 1 tablet (500 mg total) by mouth daily., Disp: 90 tablet, Rfl: 3;  NIFEdipine (PROCARDIA-XL/ADALAT-CC/NIFEDICAL-XL) 30 MG 24 hr tablet, Take 30 mg by mouth every morning., Disp: , Rfl: ;  rosuvastatin (CRESTOR) 20 MG tablet, Take 20 mg by mouth at bedtime., Disp: , Rfl:  tiotropium (SPIRIVA) 18 MCG inhalation capsule, Place 1 capsule (18 mcg total) into inhaler and inhale daily., Disp: 30 capsule, Rfl: 11;  aspirin 81 MG tablet, Take 81 mg by mouth at bedtime. , Disp: , Rfl: ;  peg 3350 powder (MOVIPREP) 100 G SOLR, moviprep as directed, Disp: 1 kit, Rfl: 0 Current facility-administered medications:0.9 %  sodium chloride infusion, , Intravenous, Once, Robet Leu, PA, Last Rate: 20 mL/hr at 05/30/12 1610 Facility-Administered Medications Ordered in Other Encounters: fentaNYL (SUBLIMAZE) 0.05 MG/ML injection, , , , ;  midazolam (VERSED) 2 MG/2ML injection, , , ,    Results for orders placed during the hospital encounter of 05/30/12 (from the past 48 hour(s))  APTT     Status: Normal   Collection Time   05/30/12  9:50 AM      Component Value Range Comment   aPTT 29  24 - 37 seconds   CBC     Status: Normal   Collection Time   05/30/12  9:50 AM  Component Value Range Comment   WBC 7.9  4.0 - 10.5 K/uL    RBC 4.33  3.87 - 5.11 MIL/uL    Hemoglobin 13.5  12.0 - 15.0 g/dL    HCT 16.1  09.6 - 04.5 %    MCV 93.8  78.0 - 100.0 fL    MCH 31.2  26.0 - 34.0 pg    MCHC 33.3  30.0 - 36.0 g/dL    RDW 40.9  81.1 - 91.4 %    Platelets 215  150 - 400 K/uL   PROTIME-INR     Status: Abnormal   Collection Time   05/30/12  9:50 AM      Component Value Range Comment   Prothrombin Time 11.5 (*) 11.6 - 15.2 seconds    INR 0.84  0.00 - 1.49    No results found.  Review of Systems  Constitutional: Negative for fever and chills.  Respiratory: Negative for cough and shortness of breath.   Cardiovascular: Negative for chest pain.    Gastrointestinal: Negative for nausea, vomiting and abdominal pain.  Musculoskeletal: Negative for back pain.  Neurological: Negative for headaches.  Endo/Heme/Allergies: Does not bruise/bleed easily.    Blood pressure 115/91, pulse 83, temperature 98.1 F (36.7 C), temperature source Oral, resp. rate 18, height 5\' 6"  (1.676 m), weight 135 lb (61.236 kg), SpO2 97.00%. Physical Exam  Constitutional: She is oriented to person, place, and time. She appears well-developed and well-nourished.  Cardiovascular: Normal rate and regular rhythm.   Respiratory: Effort normal and breath sounds normal.  GI: Soft. Bowel sounds are normal.  Musculoskeletal: Normal range of motion. She exhibits no edema.  Neurological: She is alert and oriented to person, place, and time.     Assessment/Plan Patient with hx of breast , lung carcinoma and hypermetabolic anterior right upper lobe lung mass. Plan is for CT guided biopsy of the right lung mass today. Details/risks of procedure d/w pt/family with their understanding and consent.  ALLRED,D KEVIN 05/30/2012, 10:36 AM

## 2012-05-30 NOTE — Progress Notes (Signed)
Patient ambulated in hallway. Patient denies SOB, pain and dizziness.

## 2012-05-30 NOTE — Procedures (Signed)
CT guided FNA (2) and core (2) biopsy of right upper lobe lesion.  No immediate complication.

## 2012-06-06 ENCOUNTER — Other Ambulatory Visit: Payer: Self-pay | Admitting: Endocrinology

## 2012-06-06 DIAGNOSIS — Z1231 Encounter for screening mammogram for malignant neoplasm of breast: Secondary | ICD-10-CM

## 2012-06-10 ENCOUNTER — Encounter: Payer: Self-pay | Admitting: Radiation Oncology

## 2012-06-11 ENCOUNTER — Other Ambulatory Visit: Payer: Self-pay | Admitting: Endocrinology

## 2012-06-13 ENCOUNTER — Encounter: Payer: Self-pay | Admitting: Radiation Oncology

## 2012-06-13 ENCOUNTER — Ambulatory Visit
Admission: RE | Admit: 2012-06-13 | Discharge: 2012-06-13 | Disposition: A | Discharge status: Home / Self Care | Payer: Medicare Other | Source: Ambulatory Visit | Attending: Radiation Oncology | Admitting: Radiation Oncology

## 2012-06-13 VITALS — BP 147/65 | HR 76 | Temp 97.8°F | Resp 20 | Wt 133.9 lb

## 2012-06-13 DIAGNOSIS — C3410 Malignant neoplasm of upper lobe, unspecified bronchus or lung: Secondary | ICD-10-CM

## 2012-06-13 NOTE — Progress Notes (Signed)
Patient here follow up rad txs rul lung 07/30/10-08/09/09 Alert,oriented x3, dry cough, no c/o pain, eating well, had biopsy rul lung 05/30/12 results in,  93% room air,

## 2012-06-14 ENCOUNTER — Telehealth: Payer: Self-pay | Admitting: *Deleted

## 2012-06-14 NOTE — Progress Notes (Signed)
Radiation Oncology         ([phone]) [phone] ________________________________  Name: Charlotte Harris MRN: [id number]  Date: 06/13/2012  DOB: 12/03/1931  Follow-Up Visit Note  CC: Romero Belling, MD  Romero Belling, MD  Diagnosis:   Lung cancer status post stereotactic body radiotherapy  Narrative:  The patient returns today for followup regarding her recent biopsy. The patient has undergone stereotactic body radiotherapy for her lung cancer and she had worrisome changes on followup imaging involving the right upper lung. This was discussed at thoracic conference and it was recommended to proceed with a CT-guided biopsy. This was completed on 05/30/2012. Some fibrosis and focal slight atypia was seen. No sign of malignancy in the specimen. The patient denies any shortness of breath or pain in this area or in the chest elsewhere. She therefore clinically has been very stable and return to clinic today to review the results of the biopsy.                              ALLERGIES:   has no known allergies.  Meds: Current Outpatient Prescriptions  Medication Sig Dispense Refill  . acetaminophen (TYLENOL) 500 MG tablet Take 500 mg by mouth every 6 (six) hours as needed. For pain      . aspirin 81 MG tablet Take 81 mg by mouth at bedtime.       Marland Kitchen levothyroxine (SYNTHROID, LEVOTHROID) 50 MCG tablet Take 50 mcg by mouth every morning.      . metFORMIN (GLUCOPHAGE-XR) 500 MG 24 hr tablet Take 1 tablet (500 mg total) by mouth daily.  90 tablet  3  . NIFEdipine (PROCARDIA-XL/ADALAT-CC/NIFEDICAL-XL) 30 MG 24 hr tablet Take 30 mg by mouth every morning.      . peg 3350 powder (MOVIPREP) 100 G SOLR moviprep as directed  1 kit  0  . rosuvastatin (CRESTOR) 20 MG tablet Take 20 mg by mouth at bedtime.      Marland Kitchen tiotropium (SPIRIVA) 18 MCG inhalation capsule Place 1 capsule (18 mcg total) into inhaler and inhale daily.  30 capsule  11  . atenolol (TENORMIN) 25 MG tablet TAKE 1 BY MOUTH TWO TIMES A DAY  180 tablet   0  . lisinopril (PRINIVIL,ZESTRIL) 40 MG tablet TAKE 1 BY MOUTH ONCE DAILY  90 tablet  0    Physical Findings: The patient is in no acute distress. Patient is alert and oriented.  weight is 133 lb 13.9 oz (60.723 kg). Her oral temperature is 97.8 F (36.6 C). Her blood pressure is 147/65 and her pulse is 76. Her respiration is 20 and oxygen saturation is 93%. .     Lab Findings: Lab Results  Component Value Date   WBC 7.9 05/30/2012   HGB 13.5 05/30/2012   HCT 40.6 05/30/2012   MCV 93.8 05/30/2012   PLT 215 05/30/2012     Radiographic Findings: Dg Chest 1 View  05/30/2012  *RADIOLOGY REPORT*  Clinical Data: Post right lung biopsy  CHEST - 1 VIEW  Comparison: CT biopsy 05/30/2012, CT chest 02/25/2012  Findings: Right upper lobe mass lesion has been biopsied today.  No pneumothorax.  No significant hemorrhage or effusion.  Negative for heart failure.  IMPRESSION: No acute complications following right upper lobe biopsy.   Original Report Authenticated By: Camelia Phenes, M.D.    Ct Biopsy  05/30/2012  *RADIOLOGY REPORT*  Clinical history:History of lung and breast cancer.  Suspicious lesion in  the anterior right upper lobe.  PROCEDURE(S): CT GUIDED BIOPSY OF A RIGHT UPPER LOBE LESION.  Physician: Rachelle Hora. Henn, MD  Medications:Versed 2 mg, Fentanyl 100 mcg.  A radiology nurse monitored the patient for moderate sedation.  Moderate sedation time:19 minutes  Procedure:The procedure was explained to the patient.  The risks and benefits of the procedure were discussed and the patient's questions were addressed.  Informed consent was obtained from the patient.  The patient was placed supine on the CT scanner.  Images through the chest were obtained.  The lesion in the right upper lobe was identified and targeted.  The right anterior chest was prepped and draped in a sterile fashion.  Skin was anesthetized with lidocaine.  17 gauge needle was directed into the upper lobe opacity.  Two fine needle  aspirations were obtained with 20 gauge needles.  Two core biopsies were obtained with an 18 gauge core device.  Samples placed in [place].  Findings:Again noted is a wedge shaped opacity in the anterior right upper lobe adjacent to an abnormal sclerotic rib.  Complications: None  Impression:CT guided fine needle aspirations and core biopsies of a right upper lobe lesion.   Original Report Authenticated By: Richarda Overlie, M.D.     Impression:    The patient is a 78 year old female status post stereotactic body radiotherapy for early stage non-small cell lung cancer. Her biopsy of a suspicious area in the right lung was negative.   Plan:  The patient will return to clinic for followup after undergoing a repeat CT scan of the chest in 2 months.  I spent 10 minutes with the patient today, the majority of which was spent counseling the patient on the diagnosis of cancer and coordinating care.   Radene Gunning, M.D., Ph.D.

## 2012-06-14 NOTE — Telephone Encounter (Signed)
CALLED PATIENT TO INFORM OF TEST FOR 08-15-12, ARRIVAL TIME - 11:15 AM AT [facility], SPOKE WITH PATIENT AND SHE IS AWARE OF THIS TEST

## 2012-06-23 ENCOUNTER — Other Ambulatory Visit: Payer: Self-pay | Admitting: Endocrinology

## 2012-06-24 ENCOUNTER — Ambulatory Visit (HOSPITAL_COMMUNITY)
Admission: RE | Admit: 2012-06-24 | Discharge: 2012-06-24 | Disposition: A | Discharge status: Home / Self Care | Payer: Medicare Other | Source: Ambulatory Visit | Attending: Endocrinology | Admitting: Endocrinology

## 2012-06-24 DIAGNOSIS — Z1231 Encounter for screening mammogram for malignant neoplasm of breast: Secondary | ICD-10-CM

## 2012-07-05 ENCOUNTER — Ambulatory Visit (INDEPENDENT_AMBULATORY_CARE_PROVIDER_SITE_OTHER): Payer: Medicare Other | Admitting: Endocrinology

## 2012-07-05 ENCOUNTER — Encounter: Payer: Self-pay | Admitting: Endocrinology

## 2012-07-05 VITALS — BP 132/76 | HR 86 | Temp 97.8°F | Wt 133.0 lb

## 2012-07-05 DIAGNOSIS — I10 Essential (primary) hypertension: Secondary | ICD-10-CM

## 2012-07-05 DIAGNOSIS — J069 Acute upper respiratory infection, unspecified: Secondary | ICD-10-CM

## 2012-07-05 DIAGNOSIS — E785 Hyperlipidemia, unspecified: Secondary | ICD-10-CM

## 2012-07-05 DIAGNOSIS — E119 Type 2 diabetes mellitus without complications: Secondary | ICD-10-CM

## 2012-07-05 DIAGNOSIS — Z87448 Personal history of other diseases of urinary system: Secondary | ICD-10-CM

## 2012-07-05 DIAGNOSIS — E890 Postprocedural hypothyroidism: Secondary | ICD-10-CM

## 2012-07-05 DIAGNOSIS — Z79899 Other long term (current) drug therapy: Secondary | ICD-10-CM

## 2012-07-05 DIAGNOSIS — M810 Age-related osteoporosis without current pathological fracture: Secondary | ICD-10-CM

## 2012-07-05 DIAGNOSIS — E1129 Type 2 diabetes mellitus with other diabetic kidney complication: Secondary | ICD-10-CM

## 2012-07-05 LAB — HEMOGLOBIN A1C
Hgb A1c MFr Bld: 6.2 % — ABNORMAL HIGH (ref <5.7)
Mean Plasma Glucose: 131 mg/dL — ABNORMAL HIGH (ref <117)

## 2012-07-05 MED ORDER — CEFUROXIME AXETIL 250 MG PO TABS: 250.0000 mg | ORAL_TABLET | Freq: Two times a day (BID) | ORAL | Status: AC

## 2012-07-05 MED ORDER — PROMETHAZINE-CODEINE 6.25-10 MG/5ML PO SYRP: 5.0000 mL | ORAL_SOLUTION | ORAL | Status: AC | PRN

## 2012-07-05 MED ORDER — FLUTICASONE-SALMETEROL 100-50 MCG/DOSE IN AEPB: 1.0000 | INHALATION_SPRAY | Freq: Two times a day (BID) | RESPIRATORY_TRACT | Status: DC

## 2012-07-05 NOTE — Progress Notes (Signed)
Subjective:    Patient ID: Charlotte Harris, female    DOB: May 19, 1932, 78 y.o.   MRN: [id number]  HPI here for regular wellness examination.  He's feeling pretty well in general, and says chronic med probs are stable, except as noted below Past Medical History  Diagnosis Date  . Hypertension   . ASYMPTOMATIC POSTMENOPAUSAL STATUS 07/02/2009  . BREAST CANCER, HX OF 04/16/2007  . COLONIC POLYPS, HX OF 04/16/2007  . COPD 05/17/2008  . HEMATURIA, HX OF 04/16/2007  . HYPERLIPIDEMIA 04/16/2007  . HYPERTENSION 04/16/2007  . HYPOTHYROIDISM, POST-RADIATION 06/07/2008  . OSTEOPOROSIS 07/02/2009  . PERIPHERAL EDEMA 02/15/2009  . Osteoarthritis   . AODM 06/21/2007  . History of radiation therapy 07/30/09 to 08/09/09    RUL lung  . Cancer   . ADENOCARCINOMA, LUNG 07/03/2010    RUL  . Breast cancer 1991    R mastectomy, no chemo or radiation    Past Surgical History  Procedure Date  . Mastectomy 1991    right  . Tonsillectomy 1955  . Lung biopsy 08/31/11    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  . Lung biopsy 05/22/2009    RUL lung=Adenocarcinoma    History   Social History  . Marital Status: Widowed    Spouse Name: N/A    Number of Children: N/A  . Years of Education: N/A   Occupational History  . Retired (worked Public librarian)    Social History Main Topics  . Smoking status: Former Smoker -- 1.0 packs/day for 50 years    Types: Cigarettes    Quit date: 08/03/2008  . Smokeless tobacco: Not on file  . Alcohol Use: No  . Drug Use: No  . Sexually Active: Not on file   Other Topics Concern  . Not on file   Social History Narrative   Widowed 2005Quit smoking 2010No other exposures, no TB hx    Current Outpatient Prescriptions on File Prior to Visit  Medication Sig Dispense Refill  . acetaminophen (TYLENOL) 500 MG tablet Take 500 mg by mouth every 6 (six) hours as needed. For pain      . aspirin 81 MG tablet Take 81 mg by mouth at bedtime.       Marland Kitchen atenolol (TENORMIN)  25 MG tablet TAKE 1 BY MOUTH TWO TIMES A DAY  180 tablet  0  . lisinopril (PRINIVIL,ZESTRIL) 40 MG tablet TAKE 1 BY MOUTH ONCE DAILY  90 tablet  0  . metFORMIN (GLUCOPHAGE-XR) 500 MG 24 hr tablet Take 1 tablet (500 mg total) by mouth daily.  90 tablet  3  . NIFEdipine (PROCARDIA-XL/ADALAT-CC/NIFEDICAL-XL) 30 MG 24 hr tablet Take 30 mg by mouth every morning.      . peg 3350 powder (MOVIPREP) 100 G SOLR moviprep as directed  1 kit  0  . rosuvastatin (CRESTOR) 20 MG tablet Take 20 mg by mouth at bedtime.      Marland Kitchen SYNTHROID 50 MCG tablet TAKE 1 BY MOUTH ONCE DAILY  90 tablet  0  . tiotropium (SPIRIVA) 18 MCG inhalation capsule Place 1 capsule (18 mcg total) into inhaler and inhale daily.  30 capsule  11  . Fluticasone-Salmeterol (ADVAIR) 100-50 MCG/DOSE AEPB Inhale 1 puff into the lungs 2 (two) times daily.  1 each  3    No Known Allergies  Family History  Problem Relation Age of Onset  . Heart attack Mother 8  . Cancer Sister     "throat" cancer  . Heart disease Other  CAD  . Hypertension Other   . Colon cancer Neg Hx   . Stomach cancer Neg Hx     BP 132/76  Pulse 86  Temp 97.8 F (36.6 C) (Oral)  Wt 133 lb (60.328 kg)  SpO2 92%     Review of Systems  Constitutional: Negative for unexpected weight change.  HENT: Negative for hearing loss.   Eyes: Negative for visual disturbance.  Respiratory: Negative for shortness of breath.   Cardiovascular: Negative for chest pain.  Gastrointestinal: Negative for anal bleeding.  Genitourinary: Negative for hematuria and vaginal bleeding.  Musculoskeletal: Negative for back pain.  Skin: Negative for rash.  Neurological: Negative for syncope and numbness.  Hematological: Bruises/bleeds easily.  Psychiatric/Behavioral: Negative for dysphoric mood.      Objective:   Physical Exam VS: see vs page GEN: no distress HEAD: head: no deformity eyes: no periorbital swelling, no proptosis external nose and ears are normal mouth: no  lesion seen NECK: supple, thyroid is not enlarged CHEST WALL: no deformity.   BREASTS:  No mass.  No d/c.  Old healed surgical scar (left mastectomy) CV: reg rate and rhythm, no murmur ABD: abdomen is soft, nontender.  no hepatosplenomegaly.  not distended.  no hernia GENITALIA:  Normal external female.  Normal bimanual exam RECTAL: normal external and internal exam.  heme neg MUSCULOSKELETAL: muscle bulk and strength are grossly normal.  no obvious joint swelling.  gait is normal and steady EXTEMITIES: no deformity.  no ulcer on the feet.  feet are of normal color and temp.  Trace bilat leg edema.  bilat hammer toes.  There is bilateral onychomycosis and varicosities.   PULSES: dorsalis pedis intact bilat.  no carotid bruit NEURO:  cn 2-12 grossly intact.   readily moves all 4's.  sensation is intact to touch on the feet SKIN:  Normal texture and temperature.  No rash or suspicious lesion is visible.   NODES:  None palpable at the neck PSYCH: alert, oriented x3.  Does not appear anxious nor depressed.        Assessment & Plan:  Wellness visit today, with problems stable, except as noted.    SEPARATE EVALUATION FOLLOWS--EACH PROBLEM HERE IS NEW, NOT RESPONDING TO TREATMENT, OR POSES SIGNIFICANT RISK TO THE PATIENT'S HEALTH: HISTORY OF THE PRESENT ILLNESS: Pt states 12 days of slight wheezing in the chest, and assoc dry cough. PAST MEDICAL HISTORY reviewed and up to date today REVIEW OF SYSTEMS: Denies fever PHYSICAL EXAMINATION: VITAL SIGNS:  See vs page GENERAL: no distress LUNGS:  Clear to auscultation IMPRESSION: URI, new PLAN: See instruction page

## 2012-07-05 NOTE — Patient Instructions (Addendum)
please consider these measures for your health:  minimize alcohol.  do not use tobacco products.  have a colonoscopy at least every 10 years from age 78.  Women should have an annual mammogram from age 65.  keep firearms safely stored.  always use seat belts.  have working smoke alarms in your home.  see an eye doctor and dentist regularly.  never drive under the influence of alcohol or drugs (including prescription drugs).  those with fair skin should take precautions against the sun. please let me know what your wishes would be, if artificial life support measures should become necessary.  it is critically important to prevent falling down (keep floor areas well-lit, dry, and free of loose objects.  If you have a cane, walker, or wheelchair, you should use it, even for short trips around the house.  Also, try not to rush) Here are 3 prescriptions: antibiotic, cough syrup, and inhaler.  Please return in 1 year.    (update: we discussed code status.  pt requests full code, but would not want to be started or maintained on artificial life-support measures if there was not a reasonable chance of recovery)

## 2012-07-06 LAB — URINALYSIS, ROUTINE W REFLEX MICROSCOPIC
Bilirubin Urine: NEGATIVE
Glucose, UA: NEGATIVE mg/dL
Hgb urine dipstick: NEGATIVE
Ketones, ur: NEGATIVE mg/dL
Leukocytes, UA: NEGATIVE
Nitrite: NEGATIVE
Protein, ur: NEGATIVE mg/dL
Specific Gravity, Urine: 1.019 (ref 1.005–1.030)
Urobilinogen, UA: 0.2 mg/dL (ref 0.0–1.0)
pH: 5.5 (ref 5.0–8.0)

## 2012-07-06 LAB — HEPATIC FUNCTION PANEL
ALT: 10 U/L (ref 0–35)
AST: 16 U/L (ref 0–37)
Albumin: 4.4 g/dL (ref 3.5–5.2)
Alkaline Phosphatase: 41 U/L (ref 39–117)
Bilirubin, Direct: 0.1 mg/dL (ref 0.0–0.3)
Indirect Bilirubin: 0.2 mg/dL (ref 0.0–0.9)
Total Bilirubin: 0.3 mg/dL (ref 0.3–1.2)
Total Protein: 6.6 g/dL (ref 6.0–8.3)

## 2012-07-06 LAB — LIPID PANEL
Cholesterol: 128 mg/dL (ref 0–200)
HDL: 53 mg/dL (ref >39)
LDL Cholesterol: 54 mg/dL (ref 0–99)
Total CHOL/HDL Ratio: 2.4 Ratio
Triglycerides: 105 mg/dL (ref <150)
VLDL: 21 mg/dL (ref 0–40)

## 2012-07-06 LAB — BASIC METABOLIC PANEL
BUN: 19 mg/dL (ref 6–23)
CO2: 30 mEq/L (ref 19–32)
Calcium: 9.6 mg/dL (ref 8.4–10.5)
Chloride: 105 mEq/L (ref 96–112)
Creat: 0.71 mg/dL (ref 0.50–1.10)
Glucose, Bld: 92 mg/dL (ref 70–99)
Potassium: 4.7 mEq/L (ref 3.5–5.3)
Sodium: 143 mEq/L (ref 135–145)

## 2012-07-06 LAB — TSH: TSH: 0.854 u[IU]/mL (ref 0.350–4.500)

## 2012-07-06 LAB — MICROALBUMIN / CREATININE URINE RATIO
Creatinine, Urine: 99.8 mg/dL
Microalb Creat Ratio: 61.5 mg/g — ABNORMAL HIGH (ref 0.0–30.0)
Microalb, Ur: 6.14 mg/dL — ABNORMAL HIGH (ref 0.00–1.89)

## 2012-07-06 LAB — PTH, INTACT AND CALCIUM
Calcium, Total (PTH): 9.6 mg/dL (ref 8.4–10.5)
PTH: 74.9 pg/mL — ABNORMAL HIGH (ref 14.0–72.0)

## 2012-07-10 DIAGNOSIS — E119 Type 2 diabetes mellitus without complications: Secondary | ICD-10-CM | POA: Insufficient documentation

## 2012-07-21 ENCOUNTER — Encounter: Payer: Self-pay | Admitting: Endocrinology

## 2012-08-05 ENCOUNTER — Other Ambulatory Visit: Payer: Self-pay

## 2012-08-05 MED ORDER — ROSUVASTATIN CALCIUM 20 MG PO TABS: 20.0000 mg | ORAL_TABLET | Freq: Every day | ORAL | Status: DC

## 2012-08-10 ENCOUNTER — Other Ambulatory Visit: Payer: Self-pay

## 2012-08-10 MED ORDER — NIFEDIPINE ER OSMOTIC RELEASE 30 MG PO TB24: 30.0000 mg | ORAL_TABLET | Freq: Every morning | ORAL | Status: DC

## 2012-08-15 ENCOUNTER — Ambulatory Visit (HOSPITAL_COMMUNITY)
Admission: RE | Admit: 2012-08-15 | Discharge: 2012-08-15 | Disposition: A | Discharge status: Home / Self Care | Payer: Medicare Other | Source: Ambulatory Visit | Attending: Radiation Oncology | Admitting: Radiation Oncology

## 2012-08-15 DIAGNOSIS — E279 Disorder of adrenal gland, unspecified: Secondary | ICD-10-CM | POA: Insufficient documentation

## 2012-08-15 DIAGNOSIS — C3410 Malignant neoplasm of upper lobe, unspecified bronchus or lung: Secondary | ICD-10-CM

## 2012-08-15 DIAGNOSIS — C3490 Malignant neoplasm of unspecified part of unspecified bronchus or lung: Secondary | ICD-10-CM | POA: Insufficient documentation

## 2012-08-15 DIAGNOSIS — J984 Other disorders of lung: Secondary | ICD-10-CM | POA: Insufficient documentation

## 2012-08-15 MED ORDER — IOHEXOL 300 MG/ML  SOLN
80.0000 mL | Freq: Once | INTRAMUSCULAR | Status: AC | PRN
  Administered 2012-08-15: 80 mL via INTRAVENOUS

## 2012-08-17 ENCOUNTER — Ambulatory Visit: Payer: Medicare Other | Admitting: Radiation Oncology

## 2012-08-22 ENCOUNTER — Other Ambulatory Visit: Payer: Self-pay

## 2012-08-22 MED ORDER — TIOTROPIUM BROMIDE MONOHYDRATE 18 MCG IN CAPS: 18.0000 ug | ORAL_CAPSULE | Freq: Every day | RESPIRATORY_TRACT | Status: DC

## 2012-08-25 ENCOUNTER — Other Ambulatory Visit: Payer: Self-pay | Admitting: *Deleted

## 2012-08-25 MED ORDER — TIOTROPIUM BROMIDE MONOHYDRATE 18 MCG IN CAPS: 18.0000 ug | ORAL_CAPSULE | Freq: Every day | RESPIRATORY_TRACT | Status: DC

## 2012-09-01 ENCOUNTER — Telehealth: Payer: Self-pay | Admitting: *Deleted

## 2012-09-01 ENCOUNTER — Ambulatory Visit
Admission: RE | Admit: 2012-09-01 | Discharge: 2012-09-01 | Disposition: A | Discharge status: Home / Self Care | Payer: Medicare Other | Source: Ambulatory Visit | Attending: Radiation Oncology | Admitting: Radiation Oncology

## 2012-09-01 VITALS — BP 138/69 | HR 77 | Temp 98.1°F | Wt 130.8 lb

## 2012-09-01 DIAGNOSIS — C3410 Malignant neoplasm of upper lobe, unspecified bronchus or lung: Secondary | ICD-10-CM

## 2012-09-01 NOTE — Progress Notes (Signed)
Radiation Oncology         ([phone]) [phone] ________________________________  Name: Charlotte Harris MRN: [id number]  Date: 09/01/2012  DOB: 1932/01/11  Follow-Up Visit Note  CC: Romero Belling, MD  Ines Bloomer, MD  Diagnosis:   Lung cancer status post her that the body radiotherapy  Interval Since Last Radiation:  2 years   Narrative:  The patient returns today for routine follow-up.  The patient returns to clinic for ongoing followup. She has been clinically NED. The patient did have some worrisome changes but a biopsy of the prior treatment area was negative. She comes in today for review of a recent CT scan of the chest.  Symptomatically, the patient is doing very well. She denies any worsening shortness of breath and overall this please with her breathing. She denies any chest discomfort. No esophagitis.                              ALLERGIES:   has no known allergies.  Meds: Current Outpatient Prescriptions  Medication Sig Dispense Refill  . acetaminophen (TYLENOL) 500 MG tablet Take 500 mg by mouth every 6 (six) hours as needed. For pain      . aspirin 81 MG tablet Take 81 mg by mouth at bedtime.       Marland Kitchen atenolol (TENORMIN) 25 MG tablet TAKE 1 BY MOUTH TWO TIMES A DAY  180 tablet  0  . lisinopril (PRINIVIL,ZESTRIL) 40 MG tablet TAKE 1 BY MOUTH ONCE DAILY  90 tablet  0  . metFORMIN (GLUCOPHAGE-XR) 500 MG 24 hr tablet Take 1 tablet (500 mg total) by mouth daily.  90 tablet  3  . NIFEdipine (PROCARDIA-XL/ADALAT-CC/NIFEDICAL-XL) 30 MG 24 hr tablet Take 1 tablet (30 mg total) by mouth every morning.  1 tablet  3  . rosuvastatin (CRESTOR) 20 MG tablet Take 1 tablet (20 mg total) by mouth at bedtime.  90 tablet  3  . SYNTHROID 50 MCG tablet TAKE 1 BY MOUTH ONCE DAILY  90 tablet  0  . tiotropium (SPIRIVA) 18 MCG inhalation capsule Place 1 capsule (18 mcg total) into inhaler and inhale daily.  30 capsule  11    Physical Findings: The patient is in no acute distress. Patient is  alert and oriented.  weight is 130 lb 12.8 oz (59.33 kg). Her temperature is 98.1 F (36.7 C). Her blood pressure is 138/69 and her pulse is 77. Her oxygen saturation is 95%. .   General: Well-developed, in no acute distress HEENT: Normocephalic, atraumatic Cardiovascular: Regular rate and rhythm Respiratory: Clear to auscultation bilaterally GI: Soft, nontender, normal bowel sounds Extremities: No edema present   Lab Findings: Lab Results  Component Value Date   WBC 7.9 05/30/2012   HGB 13.5 05/30/2012   HCT 40.6 05/30/2012   MCV 93.8 05/30/2012   PLT 215 05/30/2012     Radiographic Findings: Ct Chest W Contrast  08/15/2012  *RADIOLOGY REPORT*  Clinical Data: Right-sided lung cancer  CT CHEST WITH CONTRAST  Technique:  Multidetector CT imaging of the chest was performed following the standard protocol during bolus administration of intravenous contrast.  Contrast: 80mL OMNIPAQUE IOHEXOL 300 MG/ML  SOLN  Comparison: CT chest from 02/25/2012.  Findings: No axillary lymphadenopathy.  Surgical clips are seen in the right axilla.  No mediastinal or hilar lymphadenopathy.  Heart size is normal. Coronary artery calcification is noted.  No pericardial or pleural effusion.  Anterior right upper  lobe peripheral lesion is not substantially changed in size although appears slightly more confluent and the margins are better defined. The lesion measures 2.2 x 1.8 cm today compared to 2.3 x 2.2 cm previously. 5 mm nodule just lateral to this dominant lesion is stable.  1.4 x 1.0 cm ill-defined non confluent soft tissue lesion more inferolaterally in the right upper lobe has decreased from 1.9 x 1.0 cm previously.  The left lung remains clear with some linear scarring along the posterior lingula, unchanged.  Sclerotic changes in the anterior right second rib are stable. Although incompletely visualized, 12 mm nodule is seen in the right adrenal gland, not substantially changed.  IMPRESSION: Dominant  anterior right upper lobe lesion measures slightly smaller and is more well defined on today's study. To other nodular areas in the right upper lobe are also stable to slightly decreased in the interval.  No new or progressive disease in the chest.  Stable appearance of right adrenal nodule.   Original Report Authenticated By: Kennith Center, M.D.     Impression:    The patient is doing well and remains clinically NED at this time. I do a chance to personally review her CT scan of the chest and this looked good. No sign of progressive disease.  Plan:  Repeat CT scan of the chest in 6 months, then followup appointment.   Radene Gunning, M.D., Ph.D.

## 2012-09-01 NOTE — Telephone Encounter (Signed)
CALLED PATIENT TO INFORM TO TEST AND FU VISIT, SPOKE WITH PATIENT AND SHE IS AWARE OF THESE APPTS.

## 2012-09-01 NOTE — Progress Notes (Signed)
Patient here for routine follow up completion of lung cancer radiation of right upper lobe in January 2011.Denies pain.Shortness of breath only with exertion.No cough.Ct scan reveals no progression of disease.

## 2012-09-08 ENCOUNTER — Other Ambulatory Visit: Payer: Self-pay

## 2012-09-08 MED ORDER — METFORMIN HCL ER 500 MG PO TB24: 500.0000 mg | ORAL_TABLET | Freq: Every day | ORAL | Status: DC

## 2012-09-17 ENCOUNTER — Other Ambulatory Visit: Payer: Self-pay

## 2012-09-20 ENCOUNTER — Other Ambulatory Visit: Payer: Self-pay | Admitting: Neurology

## 2012-09-20 MED ORDER — LEVOTHYROXINE SODIUM 50 MCG PO TABS: ORAL_TABLET | ORAL | Status: DC

## 2012-09-22 ENCOUNTER — Other Ambulatory Visit: Payer: Self-pay

## 2012-09-22 MED ORDER — LEVOTHYROXINE SODIUM 50 MCG PO TABS: ORAL_TABLET | ORAL | Status: DC

## 2012-10-04 ENCOUNTER — Other Ambulatory Visit: Payer: Self-pay | Admitting: *Deleted

## 2012-10-04 MED ORDER — ATENOLOL 25 MG PO TABS: ORAL_TABLET | ORAL | Status: DC

## 2012-10-10 ENCOUNTER — Other Ambulatory Visit: Payer: Self-pay | Admitting: *Deleted

## 2012-10-10 MED ORDER — LISINOPRIL 40 MG PO TABS: 40.0000 mg | ORAL_TABLET | Freq: Every day | ORAL | Status: DC

## 2012-12-28 ENCOUNTER — Other Ambulatory Visit: Payer: Self-pay

## 2012-12-28 MED ORDER — LEVOTHYROXINE SODIUM 50 MCG PO TABS: ORAL_TABLET | ORAL | Status: DC

## 2012-12-29 ENCOUNTER — Other Ambulatory Visit: Payer: Self-pay | Admitting: *Deleted

## 2012-12-29 MED ORDER — LEVOTHYROXINE SODIUM 50 MCG PO TABS: ORAL_TABLET | ORAL | Status: DC

## 2013-01-16 ENCOUNTER — Other Ambulatory Visit: Payer: Self-pay | Admitting: *Deleted

## 2013-01-16 ENCOUNTER — Encounter: Payer: Self-pay | Admitting: Radiation Oncology

## 2013-01-16 MED ORDER — LISINOPRIL 40 MG PO TABS: 40.0000 mg | ORAL_TABLET | Freq: Every day | ORAL | Status: DC

## 2013-01-17 ENCOUNTER — Other Ambulatory Visit: Payer: Self-pay | Admitting: *Deleted

## 2013-01-17 MED ORDER — LISINOPRIL 40 MG PO TABS: 40.0000 mg | ORAL_TABLET | Freq: Every day | ORAL | Status: DC

## 2013-02-01 ENCOUNTER — Telehealth: Payer: Self-pay | Admitting: *Deleted

## 2013-02-01 NOTE — Telephone Encounter (Signed)
Patient called and is requesting to change her lab appt on 02/27/13 to 02/20/13 instead, She stated"radiation Oncology made this appt", will notify  MD who ordered lab  And will have someone call her back after speaking with MD 4:23 PM

## 2013-02-02 ENCOUNTER — Telehealth: Payer: Self-pay | Admitting: *Deleted

## 2013-02-02 NOTE — Telephone Encounter (Signed)
CALLED PATIENT TO INFORM THAT LAB APPT. HAS BEEN CHANGED TO 02-20-13 AT 10 AM, SPOKE WITH PATIENT AND SHE IS AWARE OF THIS APPT.

## 2013-02-20 ENCOUNTER — Ambulatory Visit
Admission: RE | Admit: 2013-02-20 | Discharge: 2013-02-20 | Disposition: A | Discharge status: Home / Self Care | Payer: Medicare Other | Source: Ambulatory Visit | Attending: Radiation Oncology | Admitting: Radiation Oncology

## 2013-02-20 DIAGNOSIS — C3410 Malignant neoplasm of upper lobe, unspecified bronchus or lung: Secondary | ICD-10-CM | POA: Insufficient documentation

## 2013-02-20 LAB — BUN AND CREATININE (CC13)
BUN: 16.7 mg/dL (ref 7.0–26.0)
Creatinine: 0.8 mg/dL (ref 0.6–1.1)

## 2013-02-27 ENCOUNTER — Ambulatory Visit: Payer: Medicare Other

## 2013-03-01 ENCOUNTER — Encounter (HOSPITAL_COMMUNITY): Payer: Self-pay

## 2013-03-01 ENCOUNTER — Ambulatory Visit (HOSPITAL_COMMUNITY)
Admission: RE | Admit: 2013-03-01 | Discharge: 2013-03-01 | Disposition: A | Discharge status: Home / Self Care | Payer: Medicare Other | Source: Ambulatory Visit | Attending: Radiation Oncology | Admitting: Radiation Oncology

## 2013-03-01 DIAGNOSIS — I2510 Atherosclerotic heart disease of native coronary artery without angina pectoris: Secondary | ICD-10-CM | POA: Insufficient documentation

## 2013-03-01 DIAGNOSIS — C3410 Malignant neoplasm of upper lobe, unspecified bronchus or lung: Secondary | ICD-10-CM | POA: Insufficient documentation

## 2013-03-01 MED ORDER — IOHEXOL 300 MG/ML  SOLN
80.0000 mL | Freq: Once | INTRAMUSCULAR | Status: AC | PRN
  Administered 2013-03-01: 80 mL via INTRAVENOUS

## 2013-03-02 ENCOUNTER — Ambulatory Visit: Payer: Medicare Other | Admitting: Radiation Oncology

## 2013-03-02 ENCOUNTER — Ambulatory Visit
Admission: RE | Admit: 2013-03-02 | Discharge: 2013-03-02 | Disposition: A | Discharge status: Home / Self Care | Payer: Medicare Other | Source: Ambulatory Visit | Attending: Radiation Oncology | Admitting: Radiation Oncology

## 2013-03-02 NOTE — Progress Notes (Signed)
Charlotte Harris here for follow up after treatment for her right upper lung.  She denies pain and fatigue.  She has some shortness of breath while working on refinishing her cabinets.  She denies a cough.  She has lost weight recently because she says she does not have much of an appetite.  She was 135 lbs on 10/13 and she is now 127 lbs.

## 2013-03-02 NOTE — Progress Notes (Signed)
Radiation Oncology         [phone] ________________________________  Name: Charlotte Harris MRN: [id number]  Date: 03/02/2013  DOB: [birth year]  Follow-Up Visit Note  CC: Romero Belling, MD  Ines Bloomer, MD  Diagnosis:   Non-small cell lung cancer status post stereotactic body radiotherapy to the left upper lung  Interval Since Last Radiation:  3 1/2 years   Narrative:  The patient returns today for routine follow-up.  The patient states that she continues to do very well. No significant shortness of breath. No discomfort in the chest area. She does have some fatigue on occasion but this she feels has been cleared to some increased activity around the house in terms of some projects. The patient had a recent CT scan of the chest and presents today to discuss this.                              ALLERGIES:  has No Known Allergies.  Meds: Current Outpatient Prescriptions  Medication Sig Dispense Refill  . acetaminophen (TYLENOL) 500 MG tablet Take 500 mg by mouth every 6 (six) hours as needed. For pain      . aspirin 81 MG tablet Take 81 mg by mouth at bedtime.       Marland Kitchen atenolol (TENORMIN) 25 MG tablet TAKE 1 BY MOUTH TWO TIMES A DAY  180 tablet  3  . levothyroxine (SYNTHROID) 50 MCG tablet TAKE 1 BY MOUTH ONCE DAILY  90 tablet  0  . lisinopril (PRINIVIL,ZESTRIL) 40 MG tablet Take 1 tablet (40 mg total) by mouth daily.  90 tablet  0  . metFORMIN (GLUCOPHAGE-XR) 500 MG 24 hr tablet Take 1 tablet (500 mg total) by mouth daily.  90 tablet  3  . NIFEdipine (PROCARDIA-XL/ADALAT-CC/NIFEDICAL-XL) 30 MG 24 hr tablet Take 1 tablet (30 mg total) by mouth every morning.  1 tablet  3  . rosuvastatin (CRESTOR) 20 MG tablet Take 1 tablet (20 mg total) by mouth at bedtime.  90 tablet  3  . tiotropium (SPIRIVA) 18 MCG inhalation capsule Place 1 capsule (18 mcg total) into inhaler and inhale daily.  30 capsule  11   No current facility-administered medications for this encounter.    Physical  Findings: The patient is in no acute distress. Patient is alert and oriented.  height is 5\' 6"  (1.676 m) and weight is 127 lb 12.8 oz (57.97 kg). Her temperature is 98.9 F (37.2 C). Her blood pressure is 148/59 and her pulse is 81. Her oxygen saturation is 94%. .   General: Well-developed, in no acute distress HEENT: Normocephalic, atraumatic Cardiovascular: Regular rate and rhythm Respiratory: Clear to auscultation bilaterally GI: Soft, nontender, normal bowel sounds Extremities: No edema present   Lab Findings: Lab Results  Component Value Date   WBC 7.9 05/30/2012   HGB 13.5 05/30/2012   HCT 40.6 05/30/2012   MCV 93.8 05/30/2012   PLT 215 05/30/2012     Radiographic Findings: Ct Chest W Contrast  03/01/2013   *RADIOLOGY REPORT*  Clinical Data: Non-small cell lung cancer  CT CHEST WITH CONTRAST  Technique:  Multidetector CT imaging of the chest was performed following the standard protocol during bolus administration of intravenous contrast.  Contrast: 80mL OMNIPAQUE IOHEXOL 300 MG/ML  SOLN  Comparison: 08/15/2012  Findings:  There is no axillary lymphadenopathy.  No mediastinal or hilar lymphadenopathy.  Heart is enlarged without pericardial effusion. Coronary artery calcification is noted.  No pleural effusion.  Dominant anterior right upper lobe lesion has not changed substantially in the interval.  When measuring at the same level and in the same dimensions as on the previous study, it measures 2.2 x 1.7 cm today compared 2.2 x 1.8 cm previously. The 5 mm nodule lateral to this dominant lesion is stable. A third previously measured area of ill-defined soft tissue density and the right upper lobe lesion which was measured at 14 x 10 mm previously now measures 16 x 8 mm.  The lungs are otherwise clear without new or progressive lesions evident.  There is some scarring again noted in the posterior lingula, stable.  4.9 cm incompletely visualized water density lesion in the right upper  kidney is compatible with a cyst.  12 mm right adrenal nodule is stable.  IMPRESSION: No substantial interval change exam.  No new or progressive lesions.  Dominant right upper lobe lesion is stable as are two other areas of abnormal soft tissue attenuation within the right upper lobe.   Original Report Authenticated By: Kennith Center, M.D.    Impression:    The patient's CT scan looked good. This was without any substantial change and no new or progressive lesions were seen.  Plan:  CT scan of the chest in 6 months, then followup appointment.  I spent 10 minutes with the patient today, the majority of which was spent counseling the patient on the diagnosis of cancer and coordinating care.   Radene Gunning, M.D., Ph.D.

## 2013-03-09 ENCOUNTER — Ambulatory Visit: Payer: Medicare Other | Admitting: Radiation Oncology

## 2013-03-31 ENCOUNTER — Other Ambulatory Visit: Payer: Self-pay | Admitting: Endocrinology

## 2013-04-15 ENCOUNTER — Other Ambulatory Visit: Payer: Self-pay | Admitting: Endocrinology

## 2013-06-08 ENCOUNTER — Other Ambulatory Visit: Payer: Self-pay

## 2013-06-16 ENCOUNTER — Other Ambulatory Visit: Payer: Self-pay | Admitting: Endocrinology

## 2013-06-16 DIAGNOSIS — Z0000 Encounter for general adult medical examination without abnormal findings: Secondary | ICD-10-CM

## 2013-06-30 ENCOUNTER — Ambulatory Visit (HOSPITAL_COMMUNITY)
Admission: RE | Admit: 2013-06-30 | Discharge: 2013-06-30 | Disposition: A | Discharge status: Home / Self Care | Payer: Medicare Other | Source: Ambulatory Visit | Attending: Endocrinology | Admitting: Endocrinology

## 2013-06-30 DIAGNOSIS — Z0000 Encounter for general adult medical examination without abnormal findings: Secondary | ICD-10-CM

## 2013-06-30 DIAGNOSIS — Z1231 Encounter for screening mammogram for malignant neoplasm of breast: Secondary | ICD-10-CM | POA: Insufficient documentation

## 2013-07-11 ENCOUNTER — Ambulatory Visit (INDEPENDENT_AMBULATORY_CARE_PROVIDER_SITE_OTHER): Payer: Medicare Other | Admitting: Endocrinology

## 2013-07-11 ENCOUNTER — Encounter: Payer: Self-pay | Admitting: Endocrinology

## 2013-07-11 VITALS — BP 126/80 | HR 87 | Temp 98.2°F | Ht 66.0 in | Wt 122.0 lb

## 2013-07-11 DIAGNOSIS — E890 Postprocedural hypothyroidism: Secondary | ICD-10-CM

## 2013-07-11 DIAGNOSIS — Z0000 Encounter for general adult medical examination without abnormal findings: Secondary | ICD-10-CM

## 2013-07-11 DIAGNOSIS — M810 Age-related osteoporosis without current pathological fracture: Secondary | ICD-10-CM

## 2013-07-11 DIAGNOSIS — Z79899 Other long term (current) drug therapy: Secondary | ICD-10-CM

## 2013-07-11 DIAGNOSIS — I10 Essential (primary) hypertension: Secondary | ICD-10-CM

## 2013-07-11 DIAGNOSIS — E119 Type 2 diabetes mellitus without complications: Secondary | ICD-10-CM

## 2013-07-11 DIAGNOSIS — E1129 Type 2 diabetes mellitus with other diabetic kidney complication: Secondary | ICD-10-CM

## 2013-07-11 DIAGNOSIS — E785 Hyperlipidemia, unspecified: Secondary | ICD-10-CM

## 2013-07-11 LAB — URINALYSIS, ROUTINE W REFLEX MICROSCOPIC
Bilirubin Urine: NEGATIVE
Ketones, ur: NEGATIVE
Leukocytes, UA: NEGATIVE
Nitrite: NEGATIVE
Specific Gravity, Urine: 1.025 (ref 1.000–1.030)
Total Protein, Urine: NEGATIVE
Urine Glucose: NEGATIVE
Urobilinogen, UA: 0.2 (ref 0.0–1.0)
pH: 5.5 (ref 5.0–8.0)

## 2013-07-11 LAB — LIPID PANEL
Cholesterol: 229 mg/dL — ABNORMAL HIGH (ref 0–200)
HDL: 71 mg/dL (ref >39.00)
Total CHOL/HDL Ratio: 3
Triglycerides: 159 mg/dL — ABNORMAL HIGH (ref 0.0–149.0)
VLDL: 31.8 mg/dL (ref 0.0–40.0)

## 2013-07-11 LAB — HEPATIC FUNCTION PANEL
ALT: 9 U/L (ref 0–35)
AST: 18 U/L (ref 0–37)
Albumin: 4.1 g/dL (ref 3.5–5.2)
Alkaline Phosphatase: 45 U/L (ref 39–117)
Bilirubin, Direct: 0 mg/dL (ref 0.0–0.3)
Total Bilirubin: 0.4 mg/dL (ref 0.3–1.2)
Total Protein: 7.1 g/dL (ref 6.0–8.3)

## 2013-07-11 LAB — CBC WITH DIFFERENTIAL/PLATELET
Basophils Absolute: 0 10*3/uL (ref 0.0–0.1)
Basophils Relative: 0.4 % (ref 0.0–3.0)
Eosinophils Absolute: 0.1 10*3/uL (ref 0.0–0.7)
Eosinophils Relative: 1.5 % (ref 0.0–5.0)
HCT: 37.8 % (ref 36.0–46.0)
Hemoglobin: 12.7 g/dL (ref 12.0–15.0)
Lymphocytes Relative: 26.5 % (ref 12.0–46.0)
Lymphs Abs: 1.9 10*3/uL (ref 0.7–4.0)
MCHC: 33.6 g/dL (ref 30.0–36.0)
MCV: 94.3 fl (ref 78.0–100.0)
Monocytes Absolute: 0.8 10*3/uL (ref 0.1–1.0)
Monocytes Relative: 11.4 % (ref 3.0–12.0)
Neutro Abs: 4.3 10*3/uL (ref 1.4–7.7)
Neutrophils Relative %: 60.2 % (ref 43.0–77.0)
Platelets: 215 10*3/uL (ref 150.0–400.0)
RBC: 4.01 Mil/uL (ref 3.87–5.11)
RDW: 14.7 % — ABNORMAL HIGH (ref 11.5–14.6)
WBC: 7.1 10*3/uL (ref 4.5–10.5)

## 2013-07-11 LAB — BASIC METABOLIC PANEL
BUN: 24 mg/dL — ABNORMAL HIGH (ref 6–23)
CO2: 26 mEq/L (ref 19–32)
Calcium: 9.2 mg/dL (ref 8.4–10.5)
Chloride: 100 mEq/L (ref 96–112)
Creatinine, Ser: 0.8 mg/dL (ref 0.4–1.2)
GFR: 77.94 mL/min (ref >60.00)
Glucose, Bld: 118 mg/dL — ABNORMAL HIGH (ref 70–99)
Potassium: 3.4 mEq/L — ABNORMAL LOW (ref 3.5–5.1)
Sodium: 134 mEq/L — ABNORMAL LOW (ref 135–145)

## 2013-07-11 LAB — MICROALBUMIN / CREATININE URINE RATIO
Creatinine,U: 129.8 mg/dL
Microalb Creat Ratio: 4.4 mg/g (ref 0.0–30.0)
Microalb, Ur: 5.7 mg/dL — ABNORMAL HIGH (ref 0.0–1.9)

## 2013-07-11 LAB — HEMOGLOBIN A1C: Hgb A1c MFr Bld: 6.1 % (ref 4.6–6.5)

## 2013-07-11 MED ORDER — ROSUVASTATIN CALCIUM 40 MG PO TABS: ORAL_TABLET | ORAL | Status: DC

## 2013-07-11 NOTE — Progress Notes (Signed)
Subjective:    Patient ID: Charlotte Harris, female    DOB: Feb 20, 1932, 79 y.o.   MRN: [id number]  HPI Pt is here for regular wellness examination, and is feeling pretty well in general, and says chronic med probs are stable, except as noted below Past Medical History  Diagnosis Date  . Hypertension   . ASYMPTOMATIC POSTMENOPAUSAL STATUS 07/02/2009  . BREAST CANCER, HX OF 04/16/2007  . COLONIC POLYPS, HX OF 04/16/2007  . COPD 05/17/2008  . HEMATURIA, HX OF 04/16/2007  . HYPERLIPIDEMIA 04/16/2007  . HYPERTENSION 04/16/2007  . HYPOTHYROIDISM, POST-RADIATION 06/07/2008  . OSTEOPOROSIS 07/02/2009  . PERIPHERAL EDEMA 02/15/2009  . Osteoarthritis   . AODM 06/21/2007  . History of radiation therapy 07/30/09 to 08/09/09    RUL lung  . Cancer   . ADENOCARCINOMA, LUNG 07/03/2010    RUL  . Breast cancer 1991    R mastectomy, no chemo or radiation    Past Surgical History  Procedure Laterality Date  . Mastectomy  1991    right  . Tonsillectomy  1955  . 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 Lung biopsy  05/22/2009    RUL lung=Adenocarcinoma    History   Social History  . Marital Status: Widowed    Spouse Name: N/A    Number of Children: N/A  . Years of Education: N/A   Occupational History  . Retired (worked Public librarian)    Social History Main Topics  . Smoking status: Former Smoker -- 1.00 packs/day for 50 years    Types: Cigarettes    Quit date: 08/03/2008  . Smokeless tobacco: Not on file  . Alcohol Use: No  . Drug Use: No  . Sexual Activity: Not on file   Other Topics Concern  . Not on file   Social History Narrative   Widowed 2005   Quit smoking 2010   No other exposures, no TB hx    Current Outpatient Prescriptions on File Prior to Visit  Medication Sig Dispense Refill  . acetaminophen (TYLENOL) 500 MG tablet Take 500 mg by mouth every 6 (six) hours as needed. For pain      . aspirin 81 MG tablet Take 81 mg by mouth at  bedtime.       Marland Kitchen atenolol (TENORMIN) 25 MG tablet TAKE 1 BY MOUTH TWO TIMES A DAY  180 tablet  3  . levothyroxine (SYNTHROID) 50 MCG tablet TAKE 1 BY MOUTH ONCE DAILY  90 tablet  0  . lisinopril (PRINIVIL,ZESTRIL) 40 MG tablet Take 1 tablet (40 mg total) by mouth daily.  90 tablet  0  . metFORMIN (GLUCOPHAGE-XR) 500 MG 24 hr tablet Take 1 tablet (500 mg total) by mouth daily.  90 tablet  3  . NIFEdipine (PROCARDIA-XL/ADALAT-CC/NIFEDICAL-XL) 30 MG 24 hr tablet Take 1 tablet (30 mg total) by mouth every morning.  1 tablet  3  . tiotropium (SPIRIVA) 18 MCG inhalation capsule Place 1 capsule (18 mcg total) into inhaler and inhale daily.  30 capsule  11   No current facility-administered medications on file prior to visit.   No Known Allergies  Family History  Problem Relation Age of Onset  . Heart attack Mother 34  . Cancer Sister     "throat" cancer  . Heart disease Other     CAD  . Hypertension Other   . Colon cancer Neg Hx   . Stomach cancer Neg Hx    BP  126/80  Pulse 87  Temp(Src) 98.2 F (36.8 C) (Oral)  Ht 5\' 6"  (1.676 m)  Wt 122 lb (55.339 kg)  BMI 19.70 kg/m2  SpO2 91%  Review of Systems  Constitutional: Negative for fever and unexpected weight change.  HENT: Negative for hearing loss.   Eyes: Negative for visual disturbance.  Respiratory: Negative for shortness of breath.   Cardiovascular: Negative for chest pain.  Gastrointestinal: Negative for anal bleeding.  Endocrine: Positive for cold intolerance.  Genitourinary: Negative for hematuria.  Musculoskeletal: Negative for back pain.  Skin: Negative for rash.  Allergic/Immunologic: Negative for environmental allergies.  Neurological: Negative for syncope and numbness.  Hematological: Bruises/bleeds easily.  Psychiatric/Behavioral: Negative for dysphoric mood.       Objective:   Physical Exam VS: see vs page GEN: no distress HEAD: head: no deformity eyes: no periorbital swelling, no proptosis external nose  and ears are normal mouth: no lesion seen NECK: supple, thyroid is not enlarged CHEST WALL: no deformity LUNGS:  Clear to auscultation BREASTS:  declined CV: reg rate and rhythm, no murmur ABD: abdomen is soft, nontender.  no hepatosplenomegaly.  not distended.  no hernia GENITALIA/RECTAL: declined MUSCULOSKELETAL: muscle bulk and strength are grossly normal.  no obvious joint swelling.  gait is normal and steady PULSES: no carotid bruit NEURO:  cn 2-12 grossly intact.   readily moves all 4's.  SKIN:  Normal texture and temperature.  No rash or suspicious lesion is visible.   NODES:  None palpable at the neck PSYCH: alert, oriented x3.  Does not appear anxious nor depressed.    i reviewed electrocardiogram.      Assessment & Plan:  Wellness visit today, with problems stable, except as noted. we discussed code status.  pt requests full code, but would not want to be started or maintained on artificial life-support measures if there was not a reasonable chance of recovery

## 2013-07-11 NOTE — Patient Instructions (Addendum)
please consider these measures for your health:  minimize alcohol.  do not use tobacco products.  have a colonoscopy at least every 10 years from age 79.  Women should have an annual mammogram from age 4.  keep firearms safely stored.  always use seat belts.  have working smoke alarms in your home.  see an eye doctor and dentist regularly.  never drive under the influence of alcohol or drugs (including prescription drugs).  those with fair skin should take precautions against the sun. please let me know what your wishes would be, if artificial life support measures should become necessary.  it is critically important to prevent falling down (keep floor areas well-lit, dry, and free of loose objects.  If you have a cane, walker, or wheelchair, you should use it, even for short trips around the house.  Also, try not to rush).   blood tests are being requested for you today.  We'll contact you with results. Please come back for a follow-up appointment in 6 months.   Reduce crestor to 1/2 pill of 40 mg, 3 times a week.

## 2013-07-12 ENCOUNTER — Other Ambulatory Visit: Payer: Self-pay | Admitting: Endocrinology

## 2013-07-12 LAB — TSH: TSH: 1.48 u[IU]/mL (ref 0.35–5.50)

## 2013-07-12 LAB — PTH, INTACT AND CALCIUM
Calcium: 9.6 mg/dL (ref 8.4–10.5)
PTH: 69.6 pg/mL (ref 14.0–72.0)

## 2013-07-12 LAB — LDL CHOLESTEROL, DIRECT: Direct LDL: 155.6 mg/dL

## 2013-07-31 ENCOUNTER — Other Ambulatory Visit: Payer: Self-pay

## 2013-07-31 MED ORDER — NIFEDIPINE ER OSMOTIC RELEASE 30 MG PO TB24: 30.0000 mg | ORAL_TABLET | Freq: Every morning | ORAL | Status: DC

## 2013-08-04 ENCOUNTER — Other Ambulatory Visit: Payer: Self-pay | Admitting: *Deleted

## 2013-08-04 MED ORDER — NIFEDIPINE ER OSMOTIC RELEASE 30 MG PO TB24: 30.0000 mg | ORAL_TABLET | Freq: Every morning | ORAL | Status: DC

## 2013-08-04 NOTE — Telephone Encounter (Signed)
Refill x 1 Ov is due 

## 2013-08-30 ENCOUNTER — Other Ambulatory Visit: Payer: Self-pay

## 2013-08-30 MED ORDER — METFORMIN HCL ER 500 MG PO TB24: 500.0000 mg | ORAL_TABLET | Freq: Every day | ORAL | Status: DC

## 2013-09-13 ENCOUNTER — Other Ambulatory Visit: Payer: Self-pay | Admitting: Radiation Oncology

## 2013-09-13 ENCOUNTER — Telehealth: Payer: Self-pay | Admitting: *Deleted

## 2013-09-13 DIAGNOSIS — C3410 Malignant neoplasm of upper lobe, unspecified bronchus or lung: Secondary | ICD-10-CM

## 2013-09-13 NOTE — Telephone Encounter (Signed)
CALLED PATIENT TO INFORM OF TEST AND FU VISIT, LVM FOR A RETURN CALL

## 2013-09-14 ENCOUNTER — Encounter: Payer: Self-pay | Admitting: Gastroenterology

## 2013-09-19 ENCOUNTER — Ambulatory Visit (HOSPITAL_COMMUNITY): Admission: RE | Admit: 2013-09-19 | Payer: Medicare Other | Source: Ambulatory Visit

## 2013-09-19 ENCOUNTER — Ambulatory Visit: Payer: Medicare Other

## 2013-09-21 ENCOUNTER — Ambulatory Visit: Payer: Medicare Other | Admitting: Radiation Oncology

## 2013-09-26 ENCOUNTER — Ambulatory Visit
Admission: RE | Admit: 2013-09-26 | Discharge: 2013-09-26 | Disposition: A | Discharge status: Home / Self Care | Payer: Medicare Other | Source: Ambulatory Visit | Attending: Radiation Oncology | Admitting: Radiation Oncology

## 2013-09-26 ENCOUNTER — Ambulatory Visit (HOSPITAL_COMMUNITY)
Admission: RE | Admit: 2013-09-26 | Discharge: 2013-09-26 | Disposition: A | Discharge status: Home / Self Care | Payer: Medicare Other | Source: Ambulatory Visit | Attending: Radiation Oncology | Admitting: Radiation Oncology

## 2013-09-26 DIAGNOSIS — E278 Other specified disorders of adrenal gland: Secondary | ICD-10-CM | POA: Insufficient documentation

## 2013-09-26 DIAGNOSIS — C3410 Malignant neoplasm of upper lobe, unspecified bronchus or lung: Secondary | ICD-10-CM

## 2013-09-26 DIAGNOSIS — C3490 Malignant neoplasm of unspecified part of unspecified bronchus or lung: Secondary | ICD-10-CM | POA: Insufficient documentation

## 2013-09-26 LAB — BUN AND CREATININE (CC13)
BUN: 23.6 mg/dL (ref 7.0–26.0)
Creatinine: 0.8 mg/dL (ref 0.6–1.1)

## 2013-09-26 MED ORDER — IOHEXOL 300 MG/ML  SOLN
80.0000 mL | Freq: Once | INTRAMUSCULAR | Status: AC | PRN
  Administered 2013-09-26: 80 mL via INTRAVENOUS

## 2013-09-27 ENCOUNTER — Other Ambulatory Visit: Payer: Self-pay | Admitting: *Deleted

## 2013-09-27 ENCOUNTER — Other Ambulatory Visit: Payer: Self-pay | Admitting: Endocrinology

## 2013-09-27 MED ORDER — ATENOLOL 25 MG PO TABS: ORAL_TABLET | ORAL | Status: DC

## 2013-10-04 ENCOUNTER — Ambulatory Visit: Payer: Medicare Other | Admitting: Radiation Oncology

## 2013-10-05 ENCOUNTER — Other Ambulatory Visit: Payer: Self-pay

## 2013-10-05 MED ORDER — TIOTROPIUM BROMIDE MONOHYDRATE 18 MCG IN CAPS: 18.0000 ug | ORAL_CAPSULE | Freq: Every day | RESPIRATORY_TRACT | Status: DC

## 2013-10-09 ENCOUNTER — Other Ambulatory Visit: Payer: Self-pay | Admitting: Endocrinology

## 2013-10-10 ENCOUNTER — Encounter: Payer: Self-pay | Admitting: Gastroenterology

## 2013-10-12 ENCOUNTER — Ambulatory Visit
Admission: RE | Admit: 2013-10-12 | Discharge: 2013-10-12 | Disposition: A | Discharge status: Home / Self Care | Payer: Medicare Other | Source: Ambulatory Visit | Attending: Radiation Oncology | Admitting: Radiation Oncology

## 2013-10-12 VITALS — BP 127/78 | HR 94 | Temp 97.7°F | Wt 121.4 lb

## 2013-10-12 DIAGNOSIS — C3410 Malignant neoplasm of upper lobe, unspecified bronchus or lung: Secondary | ICD-10-CM

## 2013-10-12 NOTE — Progress Notes (Signed)
Patient here for follow up completion of radiation January 2011 of lung to review ct chest results from 09/26/13 which is stable without evidence of progression.Denies pain.Shortness of breath with extreme exertion only.No cough or any concerns voiced.

## 2013-10-12 NOTE — Progress Notes (Signed)
Radiation Oncology         (336) [phone] ________________________________  Name: Charlotte Harris MRN: [id number]  Date: 10/12/2013  DOB: 04-08-1932  Follow-Up Visit Note  CC: Renato Shin, MD  Renato Shin, MD  Diagnosis:   Non-small cell lung cancer status post stereotactic body radiotherapy to the left upper lung  Interval Since Last Radiation:  4 years   Narrative:  The patient returns today for routine follow-up.  The patient states that she continues to do very well. She states that she has occasional shortness of breath with exertion. No real change in this. The patient denies any chest pain. Overall she states she is doing well. The patient underwent a repeat CT scan of the chest and presents today to review the results.                              ALLERGIES:  has No Known Allergies.  Meds: Current Outpatient Prescriptions  Medication Sig Dispense Refill  . acetaminophen (TYLENOL) 500 MG tablet Take 500 mg by mouth every 6 (six) hours as needed. For pain      . aspirin 81 MG tablet Take 81 mg by mouth at bedtime.       Marland Kitchen atenolol (TENORMIN) 25 MG tablet TAKE 1 BY MOUTH TWO TIMES A DAY  180 tablet  1  . levothyroxine (SYNTHROID) 50 MCG tablet TAKE 1 BY MOUTH ONCE DAILY  90 tablet  0  . levothyroxine (SYNTHROID, LEVOTHROID) 50 MCG tablet TAKE 1 TABLET BY MOUTH EVERY DAY  90 tablet  0  . lisinopril (PRINIVIL,ZESTRIL) 40 MG tablet TAKE 1 TABLET (40 MG TOTAL) BY MOUTH DAILY.  90 tablet  0  . metFORMIN (GLUCOPHAGE-XR) 500 MG 24 hr tablet Take 1 tablet (500 mg total) by mouth daily.  90 tablet  3  . NIFEdipine (PROCARDIA-XL/ADALAT-CC/NIFEDICAL-XL) 30 MG 24 hr tablet Take 1 tablet (30 mg total) by mouth every morning.  90 tablet  0  . rosuvastatin (CRESTOR) 40 MG tablet 1/4 tab, 3 times a week  24 tablet  3  . tiotropium (SPIRIVA) 18 MCG inhalation capsule Place 1 capsule (18 mcg total) into inhaler and inhale daily.  30 capsule  11   No current facility-administered medications  for this encounter.    Physical Findings: The patient is in no acute distress. Patient is alert and oriented.  weight is 121 lb 6.4 oz (55.067 kg). Her temperature is 97.7 F (36.5 C). Her blood pressure is 127/78 and her pulse is 94. Her oxygen saturation is 93%. .   General: Well-developed, in no acute distress HEENT: Normocephalic, atraumatic Cardiovascular: Regular rate and rhythm Respiratory: Clear to auscultation bilaterally GI: Soft, nontender, normal bowel sounds Extremities: No edema present   Lab Findings: Lab Results  Component Value Date   WBC 7.1 07/11/2013   HGB 12.7 07/11/2013   HCT 37.8 07/11/2013   MCV 94.3 07/11/2013   PLT 215.0 07/11/2013     Radiographic Findings: Ct Chest W Contrast  03/01/2013   *RADIOLOGY REPORT*  Clinical Data: Non-small cell lung cancer  CT CHEST WITH CONTRAST  Technique:  Multidetector CT imaging of the chest was performed following the standard protocol during bolus administration of intravenous contrast.  Contrast: 18mL OMNIPAQUE IOHEXOL 300 MG/ML  SOLN  Comparison: 08/15/2012  Findings:  There is no axillary lymphadenopathy.  No mediastinal or hilar lymphadenopathy.  Heart is enlarged without pericardial effusion. Coronary artery calcification is noted.  No pleural effusion.  Dominant anterior right upper lobe lesion has not changed substantially in the interval.  When measuring at the same level and in the same dimensions as on the previous study, it measures 2.2 x 1.7 cm today compared 2.2 x 1.8 cm previously. The 5 mm nodule lateral to this dominant lesion is stable. A third previously measured area of ill-defined soft tissue density and the right upper lobe lesion which was measured at 14 x 10 mm previously now measures 16 x 8 mm.  The lungs are otherwise clear without new or progressive lesions evident.  There is some scarring again noted in the posterior lingula, stable.  4.9 cm incompletely visualized water density lesion in the right upper  kidney is compatible with a cyst.  12 mm right adrenal nodule is stable.  IMPRESSION: No substantial interval change exam.  No new or progressive lesions.  Dominant right upper lobe lesion is stable as are two other areas of abnormal soft tissue attenuation within the right upper lobe.   Original Report Authenticated By: Misty Stanley, M.D.    Impression:    The CT scan is stable. No worrisome findings. Clinically she is doing well.  Plan:  CT scan of the chest in 6 months, then followup appointment.  I spent 10 minutes with the patient today, the majority of which was spent counseling the patient on the diagnosis of cancer and coordinating care.   Jodelle Gross, M.D., Ph.D.

## 2013-11-21 ENCOUNTER — Ambulatory Visit (AMBULATORY_SURGERY_CENTER): Payer: Self-pay

## 2013-11-21 VITALS — Ht 66.0 in | Wt 120.0 lb

## 2013-11-21 DIAGNOSIS — Z86010 Personal history of colonic polyps: Secondary | ICD-10-CM

## 2013-11-21 MED ORDER — PREPOPIK 10-3.5-12 MG-GM-GM PO PACK: 1.0000 | PACK | ORAL | Status: DC

## 2013-11-21 NOTE — Progress Notes (Signed)
Per pt, no allergies to soy or egg products.Pt not taking any weight loss meds or using  O2 at home. 

## 2013-11-24 ENCOUNTER — Encounter: Payer: Self-pay | Admitting: Gastroenterology

## 2013-11-25 ENCOUNTER — Other Ambulatory Visit: Payer: Self-pay | Admitting: Endocrinology

## 2013-12-05 ENCOUNTER — Encounter: Payer: Self-pay | Admitting: Gastroenterology

## 2013-12-05 ENCOUNTER — Ambulatory Visit (AMBULATORY_SURGERY_CENTER): Payer: Medicare Other | Admitting: Gastroenterology

## 2013-12-05 VITALS — BP 152/82 | HR 61 | Temp 97.8°F | Resp 25 | Ht 66.0 in | Wt 120.0 lb

## 2013-12-05 DIAGNOSIS — Z860100 Personal history of colon polyps, unspecified: Secondary | ICD-10-CM

## 2013-12-05 DIAGNOSIS — D126 Benign neoplasm of colon, unspecified: Secondary | ICD-10-CM

## 2013-12-05 LAB — GLUCOSE, CAPILLARY
Glucose-Capillary: 102 mg/dL — ABNORMAL HIGH (ref 70–99)
Glucose-Capillary: 83 mg/dL (ref 70–99)

## 2013-12-05 MED ORDER — SODIUM CHLORIDE 0.9 % IV SOLN: 500.0000 mL | INTRAVENOUS | Status: DC

## 2013-12-05 NOTE — Patient Instructions (Signed)
YOU HAD AN ENDOSCOPIC PROCEDURE TODAY AT THE [facility]: Refer to the procedure report that was given to you for any specific questions about what was found during the examination.  If the procedure report does not answer your questions, please call your gastroenterologist to clarify.  If you requested that your care partner not be given the details of your procedure findings, then the procedure report has been included in a sealed envelope for you to review at your convenience later.  YOU SHOULD EXPECT: Some feelings of bloating in the abdomen. Passage of more gas than usual.  Walking can help get rid of the air that was put into your GI tract during the procedure and reduce the bloating. If you had a lower endoscopy (such as a colonoscopy or flexible sigmoidoscopy) you may notice spotting of blood in your stool or on the toilet paper. If you underwent a bowel prep for your procedure, then you may not have a normal bowel movement for a few days.  DIET: Your first meal following the procedure should be a light meal and then it is ok to progress to your normal diet.  A half-sandwich or bowl of soup is an example of a good first meal.  Heavy or fried foods are harder to digest and may make you feel nauseous or bloated.  Likewise meals heavy in dairy and vegetables can cause extra gas to form and this can also increase the bloating.  Drink plenty of fluids but you should avoid alcoholic beverages for 24 hours.  ACTIVITY: Your care partner should take you home directly after the procedure.  You should plan to take it easy, moving slowly for the rest of the day.  You can resume normal activity the day after the procedure however you should NOT DRIVE or use heavy machinery for 24 hours (because of the sedation medicines used during the test).    SYMPTOMS TO REPORT IMMEDIATELY: A gastroenterologist can be reached at any hour.  During normal business hours, 8:30 AM to 5:00 PM Monday through Friday,  call [phone].  After hours and on weekends, please call the GI answering service at [phone] who will take a message and have the physician on call contact you.   Following lower endoscopy (colonoscopy or flexible sigmoidoscopy):  Excessive amounts of blood in the stool  Significant tenderness or worsening of abdominal pains  Swelling of the abdomen that is new, acute  Fever of 100F or higher    FOLLOW UP: If any biopsies were taken you will be contacted by phone or by letter within the next 1-3 weeks.  Call your gastroenterologist if you have not heard about the biopsies in 3 weeks.  Our staff will call the home number listed on your records the next business day following your procedure to check on you and address any questions or concerns that you may have at that time regarding the information given to you following your procedure. This is a courtesy call and so if there is no answer at the home number and we have not heard from you through the emergency physician on call, we will assume that you have returned to your regular daily activities without incident.  SIGNATURES/CONFIDENTIALITY: You and/or your care partner have signed paperwork which will be entered into your electronic medical record.  These signatures attest to the fact that that the information above on your After Visit Summary has been reviewed and is understood.  Full responsibility of the confidentiality   of this discharge information lies with you and/or your care-partner.  Polyp, diverticulosis, high fiber diet information given.  No aspirin, aspirin products, or anti-inflammatory meds for 2 weeks.  You will need no further colonoscopies for cancer screening or polyp surveilance due to your age.

## 2013-12-05 NOTE — Op Note (Signed)
[facility]  [address] [place], [address]   COLONOSCOPY PROCEDURE REPORT PATIENT: Mena, Simonis  MR#: [id number] BIRTHDATE: May 24, 1932 , 81  yrs. old GENDER: Female ENDOSCOPIST: Ladene Artist, MD, [facility] PROCEDURE DATE:  12/05/2013 PROCEDURE:   Colonoscopy with biopsy and snare polypectomy First Screening Colonoscopy - Avg.  risk and is 50 yrs.  old or older - No.  Prior Negative Screening - Now for repeat screening. N/A  History of Adenoma - Now for follow-up colonoscopy & has been > or = to 3 yrs.  No.  It has been less than 3 yrs since last colonoscopy.  Medical reason.  Polyps Removed Today? Yes. ASA CLASS:   Class II INDICATIONS:Patient's personal history of adenomatous colon polyps.  MEDICATIONS: MAC sedation, administered by CRNA and propofol (Diprivan) 250mg  IV DESCRIPTION OF PROCEDURE:   After the risks benefits and alternatives of the procedure were thoroughly explained, informed consent was obtained.  A digital rectal exam revealed no abnormalities of the rectum.   The LB [id number]  endoscope was introduced through the anus and advanced to the cecum, which was identified by both the appendix and ileocecal valve. No adverse events experienced.   The quality of the prep was Prepopik excellent  The instrument was then slowly withdrawn as the colon was fully examined.  COLON FINDINGS: A sessile polyp measuring 4 mm in size was found in the ascending colon.  A polypectomy was performed with cold forceps.  The resection was complete and the polyp tissue was completely retrieved.   Two sessile polyps measuring 5, 8 mm in size were found in the transverse colon.  A polypectomy was performed with a cold snare and using snare cautery.  The resection was complete and the polyp tissue was completely retrieved.   A sessile polyp measuring 5 mm in size was found in the sigmoid colon.  A polypectomy was performed with a cold snare.  The resection  was complete and the polyp tissue was completely retrieved.   Mild diverticulosis was noted in the ascending colon, transverse colon, sigmoid colon, and descending colon.   A lipoma was found in the ascending colon, at the hepatic flexure, and in the sigmoid colon.   The colon was otherwise normal.  There was no diverticulosis, inflammation, polyps or cancers unless previously stated.  Retroflexed views revealed no abnormalities. The time to cecum=4 minutes 21 seconds.  Withdrawal time=11 minutes 12 seconds. The scope was withdrawn and the procedure completed. COMPLICATIONS: There were no complications. ENDOSCOPIC IMPRESSION: 1.   Sessile polyp measuring 4 mm in the ascending colon; polypectomy performed with cold forceps 2.   Two sessile polyps measuring 5, 8 mm in the transverse colon; polypectomy performed: cold snare and snare cautery 3.   Sessile polyp measuring 5 mm in the sigmoid colon; polypectomy performed with a cold snare 4.   Mild diverticulosis in the ascending colon, transverse colon, sigmoid colon, and descending colon 5.   Lipomas in the ascending colon, at the hepatic flexure, and in the sigmoid colon RECOMMENDATIONS: 1.  Hold aspirin, aspirin products, and anti-inflammatory medication for 2 weeks. 2.  Await pathology results 3.  High fiber diet with liberal fluid intake. 4.  Given your age, you will not need another colonoscopy for colon cancer screening or polyp surveillance.  These types of tests usually stop around the age 67. eSigned:  Ladene Artist, MD, [facility] 12/05/2013 10:21 AM    PATIENT NAME:  [name] MR#:  [id number]          

## 2013-12-05 NOTE — Progress Notes (Signed)
Called to room to assist during endoscopic procedure.  Patient ID and intended procedure confirmed with present staff. Received instructions for my participation in the procedure from the performing physician.  

## 2013-12-06 ENCOUNTER — Telehealth: Payer: Self-pay | Admitting: *Deleted

## 2013-12-06 NOTE — Telephone Encounter (Signed)
Message left

## 2013-12-11 ENCOUNTER — Encounter: Payer: Self-pay | Admitting: Gastroenterology

## 2014-01-13 ENCOUNTER — Other Ambulatory Visit: Payer: Self-pay | Admitting: Endocrinology

## 2014-01-18 ENCOUNTER — Other Ambulatory Visit: Payer: Self-pay | Admitting: Endocrinology

## 2014-01-19 ENCOUNTER — Other Ambulatory Visit: Payer: Self-pay | Admitting: *Deleted

## 2014-01-19 MED ORDER — LEVOTHYROXINE SODIUM 50 MCG PO TABS: ORAL_TABLET | ORAL | Status: DC

## 2014-01-29 ENCOUNTER — Other Ambulatory Visit: Payer: Self-pay | Admitting: Endocrinology

## 2014-02-13 ENCOUNTER — Encounter: Payer: Self-pay | Admitting: Endocrinology

## 2014-02-13 ENCOUNTER — Ambulatory Visit (INDEPENDENT_AMBULATORY_CARE_PROVIDER_SITE_OTHER): Payer: Medicare Other | Admitting: Endocrinology

## 2014-02-13 VITALS — BP 132/74 | HR 76 | Temp 98.7°F | Ht 66.0 in | Wt 117.0 lb

## 2014-02-13 DIAGNOSIS — M810 Age-related osteoporosis without current pathological fracture: Secondary | ICD-10-CM

## 2014-02-13 DIAGNOSIS — E119 Type 2 diabetes mellitus without complications: Secondary | ICD-10-CM

## 2014-02-13 DIAGNOSIS — Z0000 Encounter for general adult medical examination without abnormal findings: Secondary | ICD-10-CM

## 2014-02-13 DIAGNOSIS — E1129 Type 2 diabetes mellitus with other diabetic kidney complication: Secondary | ICD-10-CM

## 2014-02-13 DIAGNOSIS — Z23 Encounter for immunization: Secondary | ICD-10-CM

## 2014-02-13 DIAGNOSIS — E785 Hyperlipidemia, unspecified: Secondary | ICD-10-CM

## 2014-02-13 LAB — LIPID PANEL
Cholesterol: 160 mg/dL (ref 0–200)
HDL: 71.2 mg/dL (ref >39.00)
LDL Cholesterol: 73 mg/dL (ref 0–99)
NonHDL: 88.8
Total CHOL/HDL Ratio: 2
Triglycerides: 81 mg/dL (ref 0.0–149.0)
VLDL: 16.2 mg/dL (ref 0.0–40.0)

## 2014-02-13 LAB — BASIC METABOLIC PANEL
BUN: 18 mg/dL (ref 6–23)
CO2: 27 mEq/L (ref 19–32)
Calcium: 9.7 mg/dL (ref 8.4–10.5)
Chloride: 101 mEq/L (ref 96–112)
Creatinine, Ser: 0.8 mg/dL (ref 0.4–1.2)
GFR: 79.02 mL/min (ref >60.00)
Glucose, Bld: 96 mg/dL (ref 70–99)
Potassium: 3.8 mEq/L (ref 3.5–5.1)
Sodium: 138 mEq/L (ref 135–145)

## 2014-02-13 LAB — HEMOGLOBIN A1C: Hgb A1c MFr Bld: 6.2 % (ref 4.6–6.5)

## 2014-02-13 NOTE — Progress Notes (Signed)
Subjective:    Patient ID: Charlotte Harris, female    DOB: [birth year], 80 y.o.   MRN: [id number]  HPI The state of at least three ongoing medical problems is addressed today, with interval history of each noted here: COPD: no change in chronic sob DM: she has lost a few lbs HTN: she denies edema. Past Medical History  Diagnosis Date  . Hypertension   . ASYMPTOMATIC POSTMENOPAUSAL STATUS 07/02/2009  . BREAST CANCER, HX OF 04/16/2007  . COLONIC POLYPS, HX OF 04/16/2007  . COPD 05/17/2008  . HEMATURIA, HX OF 04/16/2007  . HYPERLIPIDEMIA 04/16/2007  . HYPERTENSION 04/16/2007  . HYPOTHYROIDISM, POST-RADIATION 06/07/2008  . OSTEOPOROSIS 07/02/2009  . PERIPHERAL EDEMA 02/15/2009  . Osteoarthritis   . AODM 06/21/2007  . History of radiation therapy 07/30/09 to 08/09/09    RUL lung  . Cancer   . ADENOCARCINOMA, LUNG 07/03/2010    RUL  . Breast cancer 1991    R mastectomy, no chemo or radiation  . Wrist fracture, left     Past Surgical History  Procedure Laterality Date  . Mastectomy  1991    right  . Tonsillectomy  1955  . 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 Lung biopsy  05/22/2009    RUL lung=Adenocarcinoma    History   Social History  . Marital Status: Widowed    Spouse Name: N/A    Number of Children: N/A  . Years of Education: N/A   Occupational History  . Retired (worked Lexicographer)    Social History Main Topics  . Smoking status: Former Smoker -- 1.00 packs/day for 50 years    Types: Cigarettes    Quit date: 08/03/2009  . Smokeless tobacco: Never Used  . Alcohol Use: 1.2 oz/week    2 Glasses of wine per week  . Drug Use: No  . Sexual Activity: Not on file   Other Topics Concern  . Not on file   Social History Narrative   Widowed 2005   Quit smoking 2010   No other exposures, no TB hx    Current Outpatient Prescriptions on File Prior to Visit  Medication Sig Dispense Refill  . acetaminophen (TYLENOL) 500 MG  tablet Take 500 mg by mouth every 6 (six) hours as needed. For pain      . aspirin 81 MG tablet Take 81 mg by mouth at bedtime.       Marland Kitchen atenolol (TENORMIN) 25 MG tablet TAKE 1 BY MOUTH TWO TIMES A DAY  180 tablet  1  . levothyroxine (SYNTHROID) 50 MCG tablet TAKE 1 BY MOUTH ONCE DAILY  90 tablet  0  . lisinopril (PRINIVIL,ZESTRIL) 40 MG tablet TAKE 1 TABLET (40 MG TOTAL) BY MOUTH DAILY.  30 tablet  0  . metFORMIN (GLUCOPHAGE-XR) 500 MG 24 hr tablet Take 1 tablet (500 mg total) by mouth daily.  90 tablet  3  . NIFEdipine (PROCARDIA-XL/ADALAT CC) 30 MG 24 hr tablet TAKE 1 TABLET BY MOUTH EVERY MORNING  90 tablet  0  . rosuvastatin (CRESTOR) 40 MG tablet 20 mg. 1/4 tab, 3 times a week      . tiotropium (SPIRIVA) 18 MCG inhalation capsule Place 1 capsule (18 mcg total) into inhaler and inhale daily.  30 capsule  11   No current facility-administered medications on file prior to visit.    No Known Allergies  Family History  Problem Relation Age of Onset  . Heart attack  Mother 45  . Cancer Sister     "throat" cancer  . Heart disease Other     CAD  . Hypertension Other   . Colon cancer Neg Hx   . Stomach cancer Neg Hx     BP 132/74  Pulse 76  Temp(Src) 98.7 F (37.1 C) (Oral)  Ht 5\' 6"  (1.676 m)  Wt 117 lb (53.071 kg)  BMI 18.89 kg/m2  SpO2 90%   Review of Systems Denies chest pain and leg cramps    Objective:   Physical Exam Pulses: dorsalis pedis intact bilat.   Feet: no deformity. normal color and temp.  no edema.  There is bilateral onychomycosis, varicosities, and a few hammer toes.  Skin:  no ulcer on the feet.   Neuro: sensation is intact to touch on the feet    Lab Results  Component Value Date   CREATININE 0.8 02/13/2014   BUN 18 02/13/2014   NA 138 02/13/2014   K 3.8 02/13/2014   CL 101 02/13/2014   CO2 27 02/13/2014   Lab Results  Component Value Date   HGBA1C 6.2 02/13/2014   Lab Results  Component Value Date   CHOL 160 02/13/2014   HDL 71.20 02/13/2014    LDLCALC 73 02/13/2014   LDLDIRECT 155.6 07/11/2013   TRIG 81.0 02/13/2014   CHOLHDL 2 02/13/2014       Assessment & Plan:  Dyslipidemia: well-controlled; DM: well-controlled COPD: stable  (pt is advised to continue the same medications)   Subjective:   Patient here for Medicare annual wellness visit and management of other chronic and acute problems.     Risk factors: advanced age    33 of Physicians Providing Medical Care to Patient:  See "snapshot"   Activities of Daily Living: In your present state of health, do you have any difficulty performing the following activities?:  Preparing food and eating?: No  Bathing yourself: No  Getting dressed: No  Using the toilet:No  Moving around from place to place: No  In the past year have you fallen or had a near fall?:No    Home Safety: Has smoke detector and wears seat belts. Firearms are safely stored. No excess sun exposure.  Diet and Exercise  Current exercise habits: pt says very active.   Dietary issues discussed: pt reports diet "could be better."   Depression Screen  Q1: Over the past two weeks, have you felt down, depressed or hopeless? no  Q2: Over the past two weeks, have you felt little interest or pleasure in doing things? no   The following portions of the patient's history were reviewed and updated as appropriate: allergies, current medications, past family history, past medical history, past social history, past surgical history and problem list.  Past Medical History  Diagnosis Date  . Hypertension   . ASYMPTOMATIC POSTMENOPAUSAL STATUS 07/02/2009  . BREAST CANCER, HX OF 04/16/2007  . COLONIC POLYPS, HX OF 04/16/2007  . COPD 05/17/2008  . HEMATURIA, HX OF 04/16/2007  . HYPERLIPIDEMIA 04/16/2007  . HYPERTENSION 04/16/2007  . HYPOTHYROIDISM, POST-RADIATION 06/07/2008  . OSTEOPOROSIS 07/02/2009  . PERIPHERAL EDEMA 02/15/2009  . Osteoarthritis   . AODM 06/21/2007  . History of radiation therapy 07/30/09 to  08/09/09    RUL lung  . Cancer   . ADENOCARCINOMA, LUNG 07/03/2010    RUL  . Breast cancer 1991    R mastectomy, no chemo or radiation  . Wrist fracture, left     Past Surgical History  Procedure Laterality  Date  . Mastectomy  1991    right  . Tonsillectomy  1955  . 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 Lung biopsy  05/22/2009    RUL lung=Adenocarcinoma    History   Social History  . Marital Status: Widowed    Spouse Name: N/A    Number of Children: N/A  . Years of Education: N/A   Occupational History  . Retired (worked Lexicographer)    Social History Main Topics  . Smoking status: Former Smoker -- 1.00 packs/day for 50 years    Types: Cigarettes    Quit date: 08/03/2009  . Smokeless tobacco: Never Used  . Alcohol Use: 1.2 oz/week    2 Glasses of wine per week  . Drug Use: No  . Sexual Activity: Not on file   Other Topics Concern  . Not on file   Social History Narrative   Widowed 2005   Quit smoking 2010   No other exposures, no TB hx    Current Outpatient Prescriptions on File Prior to Visit  Medication Sig Dispense Refill  . acetaminophen (TYLENOL) 500 MG tablet Take 500 mg by mouth every 6 (six) hours as needed. For pain      . aspirin 81 MG tablet Take 81 mg by mouth at bedtime.       Marland Kitchen atenolol (TENORMIN) 25 MG tablet TAKE 1 BY MOUTH TWO TIMES A DAY  180 tablet  1  . levothyroxine (SYNTHROID) 50 MCG tablet TAKE 1 BY MOUTH ONCE DAILY  90 tablet  0  . lisinopril (PRINIVIL,ZESTRIL) 40 MG tablet TAKE 1 TABLET (40 MG TOTAL) BY MOUTH DAILY.  30 tablet  0  . metFORMIN (GLUCOPHAGE-XR) 500 MG 24 hr tablet Take 1 tablet (500 mg total) by mouth daily.  90 tablet  3  . NIFEdipine (PROCARDIA-XL/ADALAT CC) 30 MG 24 hr tablet TAKE 1 TABLET BY MOUTH EVERY MORNING  90 tablet  0  . rosuvastatin (CRESTOR) 40 MG tablet 20 mg. 1/4 tab, 3 times a week      . tiotropium (SPIRIVA) 18 MCG inhalation capsule Place 1 capsule (18 mcg total) into  inhaler and inhale daily.  30 capsule  11   No current facility-administered medications on file prior to visit.    No Known Allergies  Family History  Problem Relation Age of Onset  . Heart attack Mother 44  . Cancer Sister     "throat" cancer  . Heart disease Other     CAD  . Hypertension Other   . Colon cancer Neg Hx   . Stomach cancer Neg Hx     BP 132/74  Pulse 76  Temp(Src) 98.7 F (37.1 C) (Oral)  Ht 5\' 6"  (1.676 m)  Wt 117 lb (53.071 kg)  BMI 18.89 kg/m2  SpO2 90%   Review of Systems  Denies hearing loss, and visual loss Objective:   Vision:  Sees opthalmologist Hearing: grossly normal Body mass index:  See vs page Msk: pt easily and quickly performs "get-up-and-go" from a sitting position Cognitive Impairment Assessment: cognition, memory and judgment appear normal.  remembers 3/3 at 5 minutes.  excellent recall.  can easily read and write a sentence.  alert and oriented x 3   Assessment:   Medicare wellness utd on preventive parameters    Plan:   During the course of the visit the patient was educated and counseled about appropriate screening and preventive services including:  Fall prevention   Screening mammography  Bone densitometry screening  Diabetes screening  Nutrition counseling   Vaccines / LABS Zostavax / Pneumococcal Vaccine  today   Patient Instructions (the written plan) was given to the patient.   we discussed code status.  pt requests full code, but would not want to be started or maintained on artificial life-support measures if there was not a reasonable chance of recovery Patient is advised the following: Patient Instructions  please consider these measures for your health:  minimize alcohol.  do not use tobacco products.  have a colonoscopy at least every 10 years from age 84.  Women should have an annual mammogram from age 72.  keep firearms safely stored.  always use seat belts.  have working smoke alarms in your home.   see an eye doctor and dentist regularly.  never drive under the influence of alcohol or drugs (including prescription drugs).  those with fair skin should take precautions against the sun. it is critically important to prevent falling down (keep floor areas well-lit, dry, and free of loose objects.  If you have a cane, walker, or wheelchair, you should use it, even for short trips around the house.  Also, try not to rush). Please come back for a regular physical appointment in 6 months

## 2014-02-13 NOTE — Patient Instructions (Addendum)
please consider these measures for your health:  minimize alcohol.  do not use tobacco products.  have a colonoscopy at least every 10 years from age 78.  Women should have an annual mammogram from age 40.  keep firearms safely stored.  always use seat belts.  have working smoke alarms in your home.  see an eye doctor and dentist regularly.  never drive under the influence of alcohol or drugs (including prescription drugs).  those with fair skin should take precautions against the sun. it is critically important to prevent falling down (keep floor areas well-lit, dry, and free of loose objects.  If you have a cane, walker, or wheelchair, you should use it, even for short trips around the house.  Also, try not to rush). Please come back for a regular physical appointment in 6 months

## 2014-02-22 ENCOUNTER — Other Ambulatory Visit: Payer: Self-pay | Admitting: Endocrinology

## 2014-02-26 ENCOUNTER — Other Ambulatory Visit: Payer: Self-pay | Admitting: Endocrinology

## 2014-03-23 ENCOUNTER — Other Ambulatory Visit: Payer: Self-pay | Admitting: Endocrinology

## 2014-03-25 ENCOUNTER — Other Ambulatory Visit: Payer: Self-pay | Admitting: Endocrinology

## 2014-04-16 ENCOUNTER — Other Ambulatory Visit: Payer: Self-pay | Admitting: Endocrinology

## 2014-04-23 ENCOUNTER — Other Ambulatory Visit: Payer: Self-pay | Admitting: Endocrinology

## 2014-05-21 ENCOUNTER — Other Ambulatory Visit: Payer: Self-pay | Admitting: Endocrinology

## 2014-05-24 ENCOUNTER — Other Ambulatory Visit: Payer: Self-pay | Admitting: Endocrinology

## 2014-05-25 LAB — HM DIABETES EYE EXAM

## 2014-06-26 ENCOUNTER — Other Ambulatory Visit: Payer: Self-pay | Admitting: Endocrinology

## 2014-07-04 ENCOUNTER — Encounter: Payer: Self-pay | Admitting: Endocrinology

## 2014-07-15 ENCOUNTER — Other Ambulatory Visit: Payer: Self-pay | Admitting: Endocrinology

## 2014-07-17 ENCOUNTER — Other Ambulatory Visit: Payer: Self-pay | Admitting: Endocrinology

## 2014-08-05 ENCOUNTER — Encounter: Payer: Self-pay | Admitting: Radiation Oncology

## 2014-08-06 ENCOUNTER — Other Ambulatory Visit: Payer: Self-pay | Admitting: Radiation Oncology

## 2014-08-06 ENCOUNTER — Telehealth: Payer: Self-pay | Admitting: *Deleted

## 2014-08-06 DIAGNOSIS — C3412 Malignant neoplasm of upper lobe, left bronchus or lung: Secondary | ICD-10-CM

## 2014-08-06 NOTE — Telephone Encounter (Signed)
CALLED PATIENT TO INFORM OF LAB AND CT ON 08-28-14 AND HER FU VISIT ON 08-30-14, SPOKE WITH PATIENT AND SHE IS AWARE OF THESE APPTS.

## 2014-08-16 ENCOUNTER — Ambulatory Visit (INDEPENDENT_AMBULATORY_CARE_PROVIDER_SITE_OTHER): Payer: Medicare Other | Admitting: Endocrinology

## 2014-08-16 ENCOUNTER — Encounter: Payer: Self-pay | Admitting: Endocrinology

## 2014-08-16 VITALS — BP 130/82 | HR 87 | Temp 97.8°F | Ht 66.0 in | Wt 115.0 lb

## 2014-08-16 DIAGNOSIS — E119 Type 2 diabetes mellitus without complications: Secondary | ICD-10-CM

## 2014-08-16 DIAGNOSIS — Z23 Encounter for immunization: Secondary | ICD-10-CM | POA: Diagnosis not present

## 2014-08-16 DIAGNOSIS — E890 Postprocedural hypothyroidism: Secondary | ICD-10-CM

## 2014-08-16 LAB — CBC WITH DIFFERENTIAL/PLATELET
Basophils Absolute: 0 10*3/uL (ref 0.0–0.1)
Basophils Relative: 0.4 % (ref 0.0–3.0)
Eosinophils Absolute: 0.1 10*3/uL (ref 0.0–0.7)
Eosinophils Relative: 1.2 % (ref 0.0–5.0)
HCT: 42.6 % (ref 36.0–46.0)
Hemoglobin: 13.9 g/dL (ref 12.0–15.0)
Lymphocytes Relative: 22.2 % (ref 12.0–46.0)
Lymphs Abs: 1.6 10*3/uL (ref 0.7–4.0)
MCHC: 32.5 g/dL (ref 30.0–36.0)
MCV: 96.6 fl (ref 78.0–100.0)
Monocytes Absolute: 0.7 10*3/uL (ref 0.1–1.0)
Monocytes Relative: 9.5 % (ref 3.0–12.0)
Neutro Abs: 4.9 10*3/uL (ref 1.4–7.7)
Neutrophils Relative %: 66.7 % (ref 43.0–77.0)
Platelets: 224 10*3/uL (ref 150.0–400.0)
RBC: 4.41 Mil/uL (ref 3.87–5.11)
RDW: 14.8 % (ref 11.5–15.5)
WBC: 7.3 10*3/uL (ref 4.0–10.5)

## 2014-08-16 LAB — BASIC METABOLIC PANEL
BUN: 17 mg/dL (ref 6–23)
CO2: 31 mEq/L (ref 19–32)
Calcium: 10 mg/dL (ref 8.4–10.5)
Chloride: 102 mEq/L (ref 96–112)
Creatinine, Ser: 0.65 mg/dL (ref 0.40–1.20)
GFR: 93.1 mL/min (ref >60.00)
Glucose, Bld: 104 mg/dL — ABNORMAL HIGH (ref 70–99)
Potassium: 4.1 mEq/L (ref 3.5–5.1)
Sodium: 139 mEq/L (ref 135–145)

## 2014-08-16 LAB — TSH: TSH: 1.36 u[IU]/mL (ref 0.35–4.50)

## 2014-08-16 LAB — MICROALBUMIN / CREATININE URINE RATIO
Creatinine,U: 25.7 mg/dL
Microalb Creat Ratio: 7 mg/g (ref 0.0–30.0)
Microalb, Ur: 1.8 mg/dL (ref 0.0–1.9)

## 2014-08-16 LAB — HEMOGLOBIN A1C: Hgb A1c MFr Bld: 6.1 % (ref 4.6–6.5)

## 2014-08-16 MED ORDER — LOSARTAN POTASSIUM 100 MG PO TABS: 100.0000 mg | ORAL_TABLET | Freq: Every day | ORAL | Status: DC

## 2014-08-16 NOTE — Patient Instructions (Addendum)
blood tests are being requested for you today.  We'll let you know about the results.  Please come back for a regular physical appointment in 3 months Please change the lisinopril to "losartan." i have sent a prescription to your pharmacy.

## 2014-08-16 NOTE — Progress Notes (Signed)
Subjective:    Patient ID: Charlotte Harris, female    DOB: August 23, 1931, 81 y.o.   MRN: [id number]  HPI The state of at least three ongoing medical problems is addressed today, with interval history of each noted here:  COPD: no change in chronic sob. DM: she has lost a few lbs.  no cbg record, but states cbg's are well-controlled.  HTN: she denies edema.   Past Medical History  Diagnosis Date  . Hypertension   . ASYMPTOMATIC POSTMENOPAUSAL STATUS 07/02/2009  . BREAST CANCER, HX OF 04/16/2007  . COLONIC POLYPS, HX OF 04/16/2007  . COPD 05/17/2008  . HEMATURIA, HX OF 04/16/2007  . HYPERLIPIDEMIA 04/16/2007  . HYPERTENSION 04/16/2007  . HYPOTHYROIDISM, POST-RADIATION 06/07/2008  . OSTEOPOROSIS 07/02/2009  . PERIPHERAL EDEMA 02/15/2009  . Osteoarthritis   . AODM 06/21/2007  . History of radiation therapy 07/30/09 to 08/09/09    RUL lung  . Cancer   . ADENOCARCINOMA, LUNG 07/03/2010    RUL  . Breast cancer 1991    R mastectomy, no chemo or radiation  . Wrist fracture, left     Past Surgical History  Procedure Laterality Date  . Mastectomy  1991    right  . Tonsillectomy  1955  . 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 Lung biopsy  05/22/2009    RUL lung=Adenocarcinoma    History   Social History  . Marital Status: Widowed    Spouse Name: N/A    Number of Children: N/A  . Years of Education: N/A   Occupational History  . Retired (worked Lexicographer)    Social History Main Topics  . Smoking status: Former Smoker -- 1.00 packs/day for 50 years    Types: Cigarettes    Quit date: 08/03/2009  . Smokeless tobacco: Never Used  . Alcohol Use: 1.2 oz/week    2 Glasses of wine per week  . Drug Use: No  . Sexual Activity: Not on file   Other Topics Concern  . Not on file   Social History Narrative   Widowed 2005   Quit smoking 2010   No other exposures, no TB hx    Current Outpatient Prescriptions on File Prior to Visit  Medication  Sig Dispense Refill  . acetaminophen (TYLENOL) 500 MG tablet Take 500 mg by mouth every 6 (six) hours as needed. For pain    . aspirin 81 MG tablet Take 81 mg by mouth at bedtime.     Marland Kitchen atenolol (TENORMIN) 25 MG tablet TAKE 1 TABLET TWICE DAILY 180 tablet 1  . levothyroxine (SYNTHROID, LEVOTHROID) 50 MCG tablet TAKE 1 TABLET BY MOUTH EVERY DAY 90 tablet 0  . metFORMIN (GLUCOPHAGE-XR) 500 MG 24 hr tablet Take 1 tablet (500 mg total) by mouth daily. 90 tablet 3  . NIFEdipine (PROCARDIA-XL/ADALAT CC) 30 MG 24 hr tablet TAKE 1 TABLET BY MOUTH EVERY MORNING 90 tablet 0  . tiotropium (SPIRIVA) 18 MCG inhalation capsule Place 1 capsule (18 mcg total) into inhaler and inhale daily. 30 capsule 11  . rosuvastatin (CRESTOR) 40 MG tablet 20 mg. 1/4 tab, 3 times a week     No current facility-administered medications on file prior to visit.    No Known Allergies  Family History  Problem Relation Age of Onset  . Heart attack Mother 17  . Cancer Sister     "throat" cancer  . Heart disease Other     CAD  . Hypertension  Other   . Colon cancer Neg Hx   . Stomach cancer Neg Hx     BP 130/82 mmHg  Pulse 87  Temp(Src) 97.8 F (36.6 C) (Oral)  Ht 5\' 6"  (1.676 m)  Wt 115 lb (52.164 kg)  BMI 18.57 kg/m2  SpO2 92%   Review of Systems Denies cough and n/v.      Objective:   Physical Exam VITAL SIGNS:  See vs page GENERAL: no distress LUNGS:  Clear to auscultation.   Pulses: dorsalis pedis intact bilat.   MSK: no deformity of the feet, except for hammer toes.   CV: no leg edema. Skin:  no ulcer on the feet.  normal color and temp on the feet. Neuro: sensation is intact to touch on the feet Ext; There is bilateral onychomycosis and varicosities.    Lab Results  Component Value Date   HGBA1C 6.1 08/16/2014   Lab Results  Component Value Date   TSH 1.36 08/16/2014      Assessment & Plan:  DM: well-controlled COPD: well-controlled, but changing ACEI to ARB might help.  HTN:  well-controlled. Hypothyroidism: well-replaced.   Patient is advised the following: Patient Instructions  blood tests are being requested for you today.  We'll let you know about the results.  Please come back for a regular physical appointment in 3 months Please change the lisinopril to "losartan." i have sent a prescription to your pharmacy.

## 2014-08-20 ENCOUNTER — Other Ambulatory Visit: Payer: Self-pay | Admitting: Endocrinology

## 2014-08-22 ENCOUNTER — Other Ambulatory Visit: Payer: Self-pay | Admitting: Endocrinology

## 2014-08-23 ENCOUNTER — Other Ambulatory Visit: Payer: Self-pay | Admitting: Endocrinology

## 2014-08-28 ENCOUNTER — Encounter (HOSPITAL_COMMUNITY): Payer: Self-pay

## 2014-08-28 ENCOUNTER — Ambulatory Visit (HOSPITAL_COMMUNITY)
Admission: RE | Admit: 2014-08-28 | Discharge: 2014-08-28 | Disposition: A | Discharge status: Home / Self Care | Payer: Medicare Other | Source: Ambulatory Visit | Attending: Radiation Oncology | Admitting: Radiation Oncology

## 2014-08-28 ENCOUNTER — Ambulatory Visit
Admission: RE | Admit: 2014-08-28 | Discharge: 2014-08-28 | Disposition: A | Discharge status: Home / Self Care | Payer: Medicare Other | Source: Ambulatory Visit | Attending: Radiation Oncology | Admitting: Radiation Oncology

## 2014-08-28 DIAGNOSIS — C3412 Malignant neoplasm of upper lobe, left bronchus or lung: Secondary | ICD-10-CM | POA: Diagnosis not present

## 2014-08-28 DIAGNOSIS — Z85118 Personal history of other malignant neoplasm of bronchus and lung: Secondary | ICD-10-CM | POA: Insufficient documentation

## 2014-08-28 DIAGNOSIS — Z9011 Acquired absence of right breast and nipple: Secondary | ICD-10-CM | POA: Insufficient documentation

## 2014-08-28 DIAGNOSIS — Z9221 Personal history of antineoplastic chemotherapy: Secondary | ICD-10-CM | POA: Diagnosis not present

## 2014-08-28 DIAGNOSIS — Z853 Personal history of malignant neoplasm of breast: Secondary | ICD-10-CM | POA: Diagnosis not present

## 2014-08-28 DIAGNOSIS — J984 Other disorders of lung: Secondary | ICD-10-CM | POA: Diagnosis not present

## 2014-08-28 LAB — BUN AND CREATININE (CC13)
BUN: 15.8 mg/dL (ref 7.0–26.0)
Creatinine: 0.8 mg/dL (ref 0.6–1.1)
EGFR: 73 mL/min/{1.73_m2} — ABNORMAL LOW (ref >90)

## 2014-08-28 MED ORDER — IOHEXOL 300 MG/ML  SOLN
80.0000 mL | Freq: Once | INTRAMUSCULAR | Status: AC | PRN
  Administered 2014-08-28: 80 mL via INTRAVENOUS

## 2014-08-30 ENCOUNTER — Encounter: Payer: Self-pay | Admitting: Radiation Oncology

## 2014-08-30 ENCOUNTER — Ambulatory Visit
Admission: RE | Admit: 2014-08-30 | Discharge: 2014-08-30 | Disposition: A | Discharge status: Home / Self Care | Payer: Medicare Other | Source: Ambulatory Visit | Attending: Radiation Oncology | Admitting: Radiation Oncology

## 2014-08-30 VITALS — BP 139/70 | HR 89 | Temp 98.3°F | Resp 20 | Ht 66.0 in | Wt 117.9 lb

## 2014-08-30 DIAGNOSIS — C3411 Malignant neoplasm of upper lobe, right bronchus or lung: Secondary | ICD-10-CM

## 2014-08-30 NOTE — Progress Notes (Signed)
Radiation Oncology         [phone] ________________________________  Name: Charlotte Harris MRN: [id number]  Date: 08/30/2014  DOB: November 02, 1931  Follow-Up Visit Note  CC: Renato Shin, MD  Renato Shin, MD  Diagnosis:      ICD-9-CM ICD-10-CM   1. Malignant neoplasm of right upper lobe of lung 162.3 C34.11      Narrative:  The patient returns today for routine follow-up.  The patient clinically has been stable overall since the patient was last seen in terms of respiratory symptoms. No significant discomfort or pain in the chest area. The patient has undergone a recent CT scan of the chest and presents today to review these results.                          ALLERGIES:  has No Known Allergies.  Meds: Current Outpatient Prescriptions  Medication Sig Dispense Refill  . acetaminophen (TYLENOL) 500 MG tablet Take 500 mg by mouth every 6 (six) hours as needed. For pain    . aspirin 81 MG tablet Take 81 mg by mouth at bedtime.     Marland Kitchen atenolol (TENORMIN) 25 MG tablet TAKE 1 TABLET TWICE DAILY 180 tablet 1  . CRESTOR 40 MG tablet TAKE 1/4TH TABLET BY MOUTH 3 TIMES A WEEK 24 tablet 2  . levothyroxine (SYNTHROID, LEVOTHROID) 50 MCG tablet TAKE 1 TABLET BY MOUTH EVERY DAY 90 tablet 0  . losartan (COZAAR) 100 MG tablet Take 1 tablet (100 mg total) by mouth daily. 90 tablet 3  . metFORMIN (GLUCOPHAGE-XR) 500 MG 24 hr tablet TAKE 1 TABLET BY MOUTH EVERY DAY 90 tablet 3  . NIFEdipine (PROCARDIA-XL/ADALAT CC) 30 MG 24 hr tablet TAKE 1 TABLET BY MOUTH EVERY MORNING 90 tablet 0  . tiotropium (SPIRIVA) 18 MCG inhalation capsule Place 1 capsule (18 mcg total) into inhaler and inhale daily. 30 capsule 11   No current facility-administered medications for this encounter.    Physical Findings: The patient is in no acute distress. Patient is alert and oriented.  height is 5\' 6"  (1.676 m) and weight is 117 lb 14.4 oz (53.479 kg). Her oral temperature is 98.3 F (36.8 C). Her blood pressure is  139/70 and her pulse is 89. Her respiration is 20 and oxygen saturation is 90%. .   Clear to auscultation bilaterally  Lab Findings: Lab Results  Component Value Date   WBC 7.3 08/16/2014   HGB 13.9 08/16/2014   HCT 42.6 08/16/2014   MCV 96.6 08/16/2014   PLT 224.0 08/16/2014     Radiographic Findings: Ct Chest W Contrast  08/28/2014   CLINICAL DATA:  Left upper lobe lung cancer diagnosed 2010 status post chemotherapy/ XRT. History of right breast cancer.  EXAM: CT CHEST WITH CONTRAST  TECHNIQUE: Multidetector CT imaging of the chest was performed during intravenous contrast administration.  CONTRAST:  13mL OMNIPAQUE IOHEXOL 300 MG/ML  SOLN  COMPARISON:  CT chest dated 09/26/2013  FINDINGS: Mediastinum/Nodes: Heart is normal in size. No pericardial effusion.  Coronary atherosclerosis. Atherosclerotic calcifications of the aortic arch. Ectasia of the ascending thoracic aorta, measuring 3.4 cm.  No suspicious mediastinal, hilar, or axillary lymphadenopathy.  Visualized thyroid is unremarkable.  Status post right mastectomy with right axillary lymph node dissection.  Lungs/Pleura: Suspected radiation changes with stable 2.5 x 2.2 cm subpleural opacity in the anterior right upper lobe (series 5/ image 17).  Additional areas of nodular scarring laterally in the right upper lobe  are unchanged (series 5/images 15 and 20).  Mild centrilobular and paraseptal emphysematous changes. Mild scarring in the lingula. No new/suspicious pulmonary nodules. No pleural effusion or pneumothorax.  Upper abdomen: Visualized upper abdomen is notable for vascular calcifications and a 6.1 cm lateral right upper pole renal cyst.  Musculoskeletal: Degenerative changes of the thoracic spine with dextroscoliosis.  IMPRESSION: Radiation changes with stable subpleural opacity in the anterior right upper lobe.  Additional areas of nodular scarring in the lateral right upper lobe are unchanged.  No evidence of new/progressive disease  in the chest.   Electronically Signed   By: Julian Hy M.D.   On: 08/28/2014 16:29    Impression:    The patient clinically is doing satisfactorily. I have personally reviewed the patient's recent CT scan of the chest. There is no evidence for recurrence/progressive disease. We will continue ongoing routine followup.  Plan:  The patient will undergo a repeat CT scan of the chest in 12 months with subsequent followup.  I spent 15 minutes with the patient today, the majority of which was spent counseling the patient on the diagnosis of cancer and coordinating care.   Jodelle Gross, M.D., Ph.D.

## 2014-08-30 NOTE — Progress Notes (Signed)
Follow up s/p radiation Left upper lung, 07/30/09-08/09/09, had CT CHest done 08/28/14, here for resuklts, no coughing, no nausea, no pain, appetite good, nop difficulty swallowing foods or fluids, energy level good, sligh sob with exertion r64m air =90%,  4:43 PM

## 2014-08-31 ENCOUNTER — Telehealth: Payer: Self-pay | Admitting: *Deleted

## 2014-08-31 NOTE — Telephone Encounter (Signed)
CALLED PATIENT TO INFORM OF SCAN AND FU, LVM FOR A RETURN CALL

## 2014-09-27 ENCOUNTER — Other Ambulatory Visit: Payer: Self-pay | Admitting: Endocrinology

## 2014-10-11 ENCOUNTER — Other Ambulatory Visit: Payer: Self-pay | Admitting: Endocrinology

## 2014-10-12 ENCOUNTER — Other Ambulatory Visit: Payer: Self-pay | Admitting: *Deleted

## 2014-10-12 MED ORDER — TIOTROPIUM BROMIDE MONOHYDRATE 18 MCG IN CAPS: 18.0000 ug | ORAL_CAPSULE | Freq: Every day | RESPIRATORY_TRACT | Status: DC

## 2014-10-15 ENCOUNTER — Other Ambulatory Visit: Payer: Self-pay | Admitting: Endocrinology

## 2014-11-15 ENCOUNTER — Ambulatory Visit (INDEPENDENT_AMBULATORY_CARE_PROVIDER_SITE_OTHER): Payer: Medicare Other | Admitting: Endocrinology

## 2014-11-15 VITALS — BP 118/62 | HR 79 | Temp 97.9°F | Ht 66.0 in | Wt 116.0 lb

## 2014-11-15 DIAGNOSIS — T889XXA Complication of surgical and medical care, unspecified, initial encounter: Secondary | ICD-10-CM | POA: Diagnosis not present

## 2014-11-15 DIAGNOSIS — E119 Type 2 diabetes mellitus without complications: Secondary | ICD-10-CM | POA: Diagnosis not present

## 2014-11-15 DIAGNOSIS — Z23 Encounter for immunization: Secondary | ICD-10-CM

## 2014-11-15 DIAGNOSIS — Z0000 Encounter for general adult medical examination without abnormal findings: Secondary | ICD-10-CM | POA: Diagnosis not present

## 2014-11-15 LAB — HEPATIC FUNCTION PANEL
ALT: 10 U/L (ref 0–35)
AST: 17 U/L (ref 0–37)
Albumin: 4.3 g/dL (ref 3.5–5.2)
Alkaline Phosphatase: 49 U/L (ref 39–117)
Bilirubin, Direct: 0.1 mg/dL (ref 0.0–0.3)
Total Bilirubin: 0.5 mg/dL (ref 0.2–1.2)
Total Protein: 7.2 g/dL (ref 6.0–8.3)

## 2014-11-15 LAB — HEMOGLOBIN A1C: Hgb A1c MFr Bld: 6 % (ref 4.6–6.5)

## 2014-11-15 MED ORDER — TERBINAFINE HCL 250 MG PO TABS: 250.0000 mg | ORAL_TABLET | Freq: Every day | ORAL | Status: DC

## 2014-11-15 NOTE — Progress Notes (Signed)
we discussed code status.  pt requests full code, but would not want to be started or maintained on artificial life-support measures if there was not a reasonable chance of recovery 

## 2014-11-15 NOTE — Progress Notes (Signed)
Subjective:    Patient ID: Charlotte Harris, female    DOB: 1932-07-15, 81 y.o.   MRN: [id number]  HPI Pt is here for regular wellness examination, and is feeling pretty well in general, and says chronic med probs are stable, except as noted below Past Medical History  Diagnosis Date  . Hypertension   . ASYMPTOMATIC POSTMENOPAUSAL STATUS 07/02/2009  . BREAST CANCER, HX OF 04/16/2007  . COLONIC POLYPS, HX OF 04/16/2007  . COPD 05/17/2008  . HEMATURIA, HX OF 04/16/2007  . HYPERLIPIDEMIA 04/16/2007  . HYPERTENSION 04/16/2007  . HYPOTHYROIDISM, POST-RADIATION 06/07/2008  . OSTEOPOROSIS 07/02/2009  . PERIPHERAL EDEMA 02/15/2009  . Osteoarthritis   . AODM 06/21/2007  . History of radiation therapy 07/30/09 to 08/09/09    RUL lung  . Cancer   . ADENOCARCINOMA, LUNG 07/03/2010    RUL  . Breast cancer 1991    R mastectomy, no chemo or radiation  . Wrist fracture, left     Past Surgical History  Procedure Laterality Date  . Mastectomy  1991    right  . Tonsillectomy  1955  . 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 Lung biopsy  05/22/2009    RUL lung=Adenocarcinoma    History   Social History  . Marital Status: Widowed    Spouse Name: N/A  . Number of Children: N/A  . Years of Education: N/A   Occupational History  . Retired (worked Lexicographer)    Social History Main Topics  . Smoking status: Former Smoker -- 1.00 packs/day for 50 years    Types: Cigarettes    Quit date: 08/03/2009  . Smokeless tobacco: Never Used  . Alcohol Use: 1.2 oz/week    2 Glasses of wine per week  . Drug Use: No  . Sexual Activity: Not on file   Other Topics Concern  . Not on file   Social History Narrative   Widowed 2005   Quit smoking 2010   No other exposures, no TB hx    Current Outpatient Prescriptions on File Prior to Visit  Medication Sig Dispense Refill  . acetaminophen (TYLENOL) 500 MG tablet Take 500 mg by mouth every 6 (six) hours as needed.  For pain    . aspirin 81 MG tablet Take 81 mg by mouth at bedtime.     Marland Kitchen atenolol (TENORMIN) 25 MG tablet TAKE 1 TABLET BY MOUTH TWICE A DAY 180 tablet 1  . CRESTOR 40 MG tablet TAKE 1/4TH TABLET BY MOUTH 3 TIMES A WEEK 24 tablet 2  . levothyroxine (SYNTHROID, LEVOTHROID) 50 MCG tablet TAKE 1 TABLET BY MOUTH EVERY DAY 90 tablet 0  . losartan (COZAAR) 100 MG tablet Take 1 tablet (100 mg total) by mouth daily. 90 tablet 3  . metFORMIN (GLUCOPHAGE-XR) 500 MG 24 hr tablet TAKE 1 TABLET BY MOUTH EVERY DAY 90 tablet 3  . NIFEdipine (PROCARDIA-XL/ADALAT CC) 30 MG 24 hr tablet TAKE 1 TABLET BY MOUTH EVERY MORNING 90 tablet 0  . tiotropium (SPIRIVA) 18 MCG inhalation capsule Place 1 capsule (18 mcg total) into inhaler and inhale daily. 30 capsule 9   No current facility-administered medications on file prior to visit.    No Known Allergies  Family History  Problem Relation Age of Onset  . Heart attack Mother 32  . Cancer Sister     "throat" cancer  . Heart disease Other     CAD  . Hypertension Other   .  Colon cancer Neg Hx   . Stomach cancer Neg Hx     BP 118/62 mmHg  Pulse 79  Temp(Src) 97.9 F (36.6 C) (Oral)  Ht 5\' 6"  (1.676 m)  Wt 116 lb (52.617 kg)  BMI 18.73 kg/m2  SpO2 93%  Review of Systems  Constitutional: Negative for fever.  HENT: Negative for hearing loss.   Eyes: Negative for visual disturbance.  Respiratory: Negative for shortness of breath.   Cardiovascular: Negative for chest pain.  Gastrointestinal: Negative for anal bleeding.  Endocrine: Negative for cold intolerance.  Genitourinary: Negative for hematuria.  Musculoskeletal: Negative for back pain.  Skin: Negative for rash.  Allergic/Immunologic: Positive for environmental allergies.  Neurological: Negative for syncope and headaches.  Hematological: Bruises/bleeds easily.  Psychiatric/Behavioral: Negative for dysphoric mood.       Objective:   Physical Exam VS: see vs page GEN: no distress HEAD:  head: no deformity eyes: no periorbital swelling, no proptosis external nose and ears are normal mouth: no lesion seen NECK: supple, thyroid is not enlarged CHEST WALL: no deformity LUNGS:  Clear to auscultation CV: reg rate and rhythm, no murmur ABD: abdomen is soft, nontender.  no hepatosplenomegaly.  not distended.  no hernia.   MUSCULOSKELETAL: muscle bulk and strength are grossly normal.  no obvious joint swelling.  gait is normal and steady EXTEMITIES: no deformity.  no ulcer on the feet.  feet are of normal color and temp.  no edema PULSES: dorsalis pedis intact bilat.  no carotid bruit NEURO:  cn 2-12 grossly intact.   readily moves all 4's.  sensation is intact to touch on the feet SKIN:  Normal texture and temperature.  No rash or suspicious lesion is visible.   NODES:  None palpable at the neck PSYCH: alert, well-oriented.  Does not appear anxious nor depressed.   i personally reviewed electrocardiogram tracing     Assessment & Plan:  Wellness visit today, with problems stable, except as noted.    SEPARATE EVALUATION FOLLOWS--EACH PROBLEM HERE IS NEW, NOT RESPONDING TO TREATMENT, OR POSES SIGNIFICANT RISK TO THE PATIENT'S HEALTH:  HISTORY OF THE PRESENT ILLNESS: Pt states few years of moderate thickening of the toenails, but no assoc pain.  PAST MEDICAL HISTORY reviewed and up to date today REVIEW OF SYSTEMS: Denies numbness of the feet PHYSICAL EXAMINATION: VITAL SIGNS:  See vs page GENERAL: no distress Ext: There is bilateral onychomycosis of the toenails LAB/XRAY RESULTS: Lab Results  Component Value Date   ALT 10 11/15/2014   AST 17 11/15/2014   ALKPHOS 49 11/15/2014   BILITOT 0.5 11/15/2014  IMPRESSION: Onychomycosis, persistent PLAN:  i have sent a prescription to your pharmacy, for lamisil.   Subjective:   Patient here for Medicare annual wellness visit and management of other chronic and acute problems.     Risk factors: advanced age     59 of Physicians Providing Medical Care to Patient:  See "snapshot"   Activities of Daily Living: In your present state of health, do you have any difficulty performing the following activities?:  Preparing food and eating?: No  Bathing yourself: No  Getting dressed: No  Using the toilet:No  Moving around from place to place: No  In the past year have you fallen or had a near fall?:No    Home Safety: Has smoke detector and wears seat belts. No firearms. No excess sun exposure.  Diet and Exercise  Current exercise habits: pt says good Dietary issues discussed: pt reports a healthy diet (  she would like to gain weight)  Depression Screen  Q1: Over the past two weeks, have you felt down, depressed or hopeless? no  Q2: Over the past two weeks, have you felt little interest or pleasure in doing things? no   The following portions of the patient's history were reviewed and updated as appropriate: allergies, current medications, past family history, past medical history, past social history, past surgical history and problem list.   Review of Systems  Denies hearing loss, and visual loss Objective:   Vision:  Sees opthalmologist Hearing: grossly normal Body mass index:  See vs page Msk: pt easily and quickly performs "get-up-and-go" from a sitting position Cognitive Impairment Assessment: cognition, memory and judgment appear normal.  remembers 3/3 at 5 minutes.  excellent recall.  can easily read and write a sentence.  alert and oriented x 3   Assessment:   Medicare wellness utd on preventive parameters    Plan:   During the course of the visit the patient was educated and counseled about appropriate screening and preventive services including:       Fall prevention   Screening mammography  Bone densitometry screening  Diabetes screening  Nutrition counseling   Vaccines / LABS Zostavax / Pneumococcal Vaccine  today   Patient Instructions (the written plan) was given  to the patient.

## 2014-11-15 NOTE — Patient Instructions (Addendum)
i have sent a prescription to your pharmacy, for the toenail fungus.  blood tests are requested for you today.  We'll let you know about the results.  please consider these measures for your health:  minimize alcohol.  do not use tobacco products.  have a colonoscopy at least every 10 years from age 79.  Women should have an annual mammogram from age 56.  keep firearms safely stored.  always use seat belts.  have working smoke alarms in your home.  see an eye doctor and dentist regularly.  never drive under the influence of alcohol or drugs (including prescription drugs).  those with fair skin should take precautions against the sun.  it is critically important to prevent falling down (keep floor areas well-lit, dry, and free of loose objects.  If you have a cane, walker, or wheelchair, you should use it, even for short trips around the house.  Also, try not to rush).

## 2014-11-22 ENCOUNTER — Other Ambulatory Visit: Payer: Self-pay | Admitting: Endocrinology

## 2015-01-10 ENCOUNTER — Other Ambulatory Visit: Payer: Self-pay | Admitting: Endocrinology

## 2015-01-14 ENCOUNTER — Other Ambulatory Visit: Payer: Self-pay | Admitting: Endocrinology

## 2015-02-18 ENCOUNTER — Encounter: Payer: Self-pay | Admitting: Gastroenterology

## 2015-02-20 ENCOUNTER — Other Ambulatory Visit: Payer: Self-pay | Admitting: Endocrinology

## 2015-03-23 ENCOUNTER — Other Ambulatory Visit: Payer: Self-pay | Admitting: Endocrinology

## 2015-04-09 ENCOUNTER — Other Ambulatory Visit: Payer: Self-pay | Admitting: Endocrinology

## 2015-05-09 ENCOUNTER — Ambulatory Visit (INDEPENDENT_AMBULATORY_CARE_PROVIDER_SITE_OTHER): Payer: Medicare Other | Admitting: Physician Assistant

## 2015-05-09 VITALS — BP 118/76 | HR 100 | Temp 98.6°F | Resp 16 | Ht 66.0 in | Wt 116.0 lb

## 2015-05-09 DIAGNOSIS — J441 Chronic obstructive pulmonary disease with (acute) exacerbation: Secondary | ICD-10-CM | POA: Diagnosis not present

## 2015-05-09 MED ORDER — AZITHROMYCIN 250 MG PO TABS: ORAL_TABLET | ORAL | Status: DC

## 2015-05-09 MED ORDER — PREDNISONE 20 MG PO TABS: 40.0000 mg | ORAL_TABLET | Freq: Every day | ORAL | Status: DC

## 2015-05-09 NOTE — Progress Notes (Signed)
05/09/2015 at 12:44 PM  Charlotte Harris / DOB: Oct 27, 1931 / MRN: [id number]  The patient has ADENOCARCINOMA, LUNG; Hypothyroidism following radioiodine therapy; AODM; HYPERLIPIDEMIA; HYPERTENSION; COPD; OSTEOPOROSIS; PERIPHERAL EDEMA; BREAST CANCER, HX OF; COLONIC POLYPS, HX OF; HEMATURIA, HX OF; TOBACCO USE, QUIT; ASYMPTOMATIC POSTMENOPAUSAL STATUS; Encounter for long-term (current) use of other medications; Screening examination for infectious disease; Cancer ([place]); Breast cancer ([place]); History of radiation therapy; Malignant neoplasm of right upper lobe of lung ([place]); Diabetes ([place]); and Side effects of treatment on her problem list.  SUBJECTIVE  Charlotte Harris is a 81 y.o. well appearing female presenting for the chief complaint of cough and chest pain with coughing that started two days ago.  These symptoms were preceded by "a cold" with sore throat and corzya that has since resolved.  She has a history of COPD, 50 pack year history of smoking, and has had radiation to her right upper lobe for a cancer.  She has received annual CT scans and has been cancer free for 4 years now.      She  has a past medical history of Hypertension; ASYMPTOMATIC POSTMENOPAUSAL STATUS (07/02/2009); BREAST CANCER, HX OF (04/16/2007); COLONIC POLYPS, HX OF (04/16/2007); COPD (05/17/2008); HEMATURIA, HX OF (04/16/2007); HYPERLIPIDEMIA (04/16/2007); HYPERTENSION (04/16/2007); HYPOTHYROIDISM, POST-RADIATION (06/07/2008); OSTEOPOROSIS (07/02/2009); PERIPHERAL EDEMA (02/15/2009); Osteoarthritis; AODM (06/21/2007); History of radiation therapy (07/30/09 to 08/09/09); Cancer ([place]); ADENOCARCINOMA, LUNG (07/03/2010); Breast cancer ([place]) (1991); and Wrist fracture, left.    Medications reviewed and updated by myself where necessary, and exist elsewhere in the encounter.   Ms. Clapp has No Known Allergies. She  reports that she quit smoking about 5 years ago. Her smoking use included Cigarettes. She has a 50 pack-year smoking  history. She has never used smokeless tobacco. She reports that she drinks about 1.2 oz of alcohol per week. She reports that she does not use illicit drugs. She  has no sexual activity history on file. The patient  has past surgical history that includes Mastectomy (1991); Tonsillectomy (1955); Lung biopsy (08/31/11); Lung biopsy (05/22/2009); Breast surgery; and Eye surgery.  Her family history includes Cancer in her brother, brother, and sister; Heart attack (age of onset: 55) in her mother; Heart disease in her father, mother, other, and sister; Hypertension in her other. There is no history of Colon cancer or Stomach cancer.  Review of Systems  Constitutional: Positive for malaise/fatigue. Negative for fever.  Respiratory: Positive for cough and sputum production. Negative for hemoptysis, shortness of breath and wheezing.   Cardiovascular: Positive for chest pain (with coughing only). Negative for leg swelling.  Gastrointestinal: Negative for nausea.  Neurological: Positive for weakness. Negative for dizziness and headaches.    OBJECTIVE  Her  height is '5\' 6"'$  (1.676 m) and weight is 116 lb (52.617 kg). Her oral temperature is 98.6 F (37 C). Her blood pressure is 118/76 and her pulse is 100. Her respiration is 16 and oxygen saturation is 90%.  The patient's body mass index is 18.73 kg/(m^2).  Physical Exam  Constitutional: She is oriented to person, place, and time. She appears well-developed and well-nourished. No distress.  Cardiovascular: Normal heart sounds.  Exam reveals no gallop and no friction rub.   No murmur heard. Respiratory: Effort normal. No respiratory distress. She has no wheezes. She has no rales. She exhibits no tenderness.  Prolonged exhalation  GI: Soft.  Musculoskeletal: Normal range of motion.  Neurological: She is alert and oriented to person, place, and time. No cranial nerve deficit. Coordination  normal.  Skin: Skin is warm and dry. She is not diaphoretic.    Psychiatric: She has a normal mood and affect.    No results found for this or any previous visit (from the past 24 hour(s)).  ASSESSMENT & PLAN  Janal was seen today for chest congestion.  Diagnoses and all orders for this visit:  COPD exacerbation ([place]): Early. O2 sats at 90 rechecked by me.  Will treat aggressively for now. DM2 well controlled on metformin. Patient advised to RTC in three days if her symptoms are not improving for further work up.   -     predniSONE (DELTASONE) 20 MG tablet; Take 2 tablets (40 mg total) by mouth daily with breakfast. -     azithromycin (ZITHROMAX) 250 MG tablet; Take 2 tabs PO x 1 dose, then 1 tab PO QD x 4 days   The patient was advised to call or come back to clinic if she does not see an improvement in symptoms, or worsens with the above plan.   Philis Fendt, MHS, PA-C [facility] 05/09/2015 12:44 PM

## 2015-05-09 NOTE — Progress Notes (Signed)
Noted that 90% is not an unusual sat measurement for this pt

## 2015-05-21 ENCOUNTER — Other Ambulatory Visit: Payer: Self-pay | Admitting: Endocrinology

## 2015-05-22 ENCOUNTER — Other Ambulatory Visit: Payer: Self-pay | Admitting: Endocrinology

## 2015-07-11 ENCOUNTER — Other Ambulatory Visit: Payer: Self-pay | Admitting: Endocrinology

## 2015-07-24 ENCOUNTER — Other Ambulatory Visit: Payer: Self-pay | Admitting: Endocrinology

## 2015-08-02 DIAGNOSIS — E119 Type 2 diabetes mellitus without complications: Secondary | ICD-10-CM | POA: Diagnosis not present

## 2015-08-02 LAB — HM DIABETES EYE EXAM

## 2015-09-02 ENCOUNTER — Encounter (HOSPITAL_COMMUNITY): Payer: Self-pay

## 2015-09-02 ENCOUNTER — Ambulatory Visit
Admission: RE | Admit: 2015-09-02 | Discharge: 2015-09-02 | Disposition: A | Discharge status: Home / Self Care | Payer: Medicare Other | Source: Ambulatory Visit | Attending: Radiation Oncology | Admitting: Radiation Oncology

## 2015-09-02 ENCOUNTER — Other Ambulatory Visit: Payer: Self-pay | Admitting: *Deleted

## 2015-09-02 ENCOUNTER — Ambulatory Visit (HOSPITAL_COMMUNITY)
Admission: RE | Admit: 2015-09-02 | Discharge: 2015-09-02 | Disposition: A | Discharge status: Home / Self Care | Payer: Medicare Other | Source: Ambulatory Visit | Attending: Radiation Oncology | Admitting: Radiation Oncology

## 2015-09-02 DIAGNOSIS — Z9011 Acquired absence of right breast and nipple: Secondary | ICD-10-CM | POA: Diagnosis not present

## 2015-09-02 DIAGNOSIS — C3411 Malignant neoplasm of upper lobe, right bronchus or lung: Secondary | ICD-10-CM

## 2015-09-02 DIAGNOSIS — D3501 Benign neoplasm of right adrenal gland: Secondary | ICD-10-CM | POA: Diagnosis not present

## 2015-09-02 DIAGNOSIS — Z9221 Personal history of antineoplastic chemotherapy: Secondary | ICD-10-CM | POA: Insufficient documentation

## 2015-09-02 DIAGNOSIS — J432 Centrilobular emphysema: Secondary | ICD-10-CM | POA: Diagnosis not present

## 2015-09-02 DIAGNOSIS — Z923 Personal history of irradiation: Secondary | ICD-10-CM | POA: Insufficient documentation

## 2015-09-02 DIAGNOSIS — Z853 Personal history of malignant neoplasm of breast: Secondary | ICD-10-CM | POA: Insufficient documentation

## 2015-09-02 DIAGNOSIS — R918 Other nonspecific abnormal finding of lung field: Secondary | ICD-10-CM | POA: Insufficient documentation

## 2015-09-02 DIAGNOSIS — I2510 Atherosclerotic heart disease of native coronary artery without angina pectoris: Secondary | ICD-10-CM | POA: Diagnosis not present

## 2015-09-02 LAB — BUN AND CREATININE (CC13)
BUN: 19.9 mg/dL (ref 7.0–26.0)
Creatinine: 0.9 mg/dL (ref 0.6–1.1)
EGFR: 61 mL/min/{1.73_m2} — ABNORMAL LOW (ref >90)

## 2015-09-02 MED ORDER — IOHEXOL 300 MG/ML  SOLN
75.0000 mL | Freq: Once | INTRAMUSCULAR | Status: AC | PRN
  Administered 2015-09-02: 75 mL via INTRAVENOUS

## 2015-09-04 ENCOUNTER — Ambulatory Visit: Admission: RE | Admit: 2015-09-04 | Payer: Medicare Other | Source: Ambulatory Visit | Admitting: Radiation Oncology

## 2015-09-04 ENCOUNTER — Telehealth: Payer: Self-pay | Admitting: *Deleted

## 2015-09-04 NOTE — Telephone Encounter (Signed)
Called patient  And , apologized  that we needed to reschedule her appt today to this Friday 09/06/15 would 3pm be ok ?"Yes, she will be here this Friday instead,thanked this RN for the call ,will ask our scheduler Santiago Glad D[place]  To cancel todays appt and put patient in for 3pm follow per Dr. Lisbeth Renshaw 9:00 AM

## 2015-09-06 ENCOUNTER — Ambulatory Visit
Admission: RE | Admit: 2015-09-06 | Discharge: 2015-09-06 | Disposition: A | Discharge status: Home / Self Care | Payer: Medicare Other | Source: Ambulatory Visit | Attending: Radiation Oncology | Admitting: Radiation Oncology

## 2015-09-06 ENCOUNTER — Encounter: Payer: Self-pay | Admitting: Radiation Oncology

## 2015-09-06 VITALS — BP 128/58 | HR 67 | Temp 98.6°F | Resp 20 | Ht 66.0 in | Wt 116.5 lb

## 2015-09-06 DIAGNOSIS — C3411 Malignant neoplasm of upper lobe, right bronchus or lung: Secondary | ICD-10-CM | POA: Diagnosis not present

## 2015-09-06 NOTE — Progress Notes (Signed)
Follow up s/p rad to right upper lung 07/30/09-1/7/211, CT CHest reuslts from 09/02/15 here to discuss findings, no coughing, no nausea, no pain, appetite good, 3:18 PM BP 128/58 mmHg  Pulse 67  Temp(Src) 98.6 F (37 C) (Oral)  Resp 20  Ht '5\' 6"'$  (1.676 m)  Wt 116 lb 8 oz (52.844 kg)  BMI 18.81 kg/m2  SpO2 93%  Wt Readings from Last 3 Encounters:  09/06/15 116 lb 8 oz (52.844 kg)  05/09/15 116 lb (52.617 kg)  11/15/14 116 lb (52.617 kg)

## 2015-09-06 NOTE — Progress Notes (Signed)
Radiation Oncology         [phone] ________________________________  Name: Charlotte Harris MRN: [id number]  Date: 09/06/2015  DOB: [birth year]  Follow-Up Visit Note  CC: Charlotte Shin, MD  Charlotte Shin, MD  Diagnosis:      ICD-9-CM ICD-10-CM   1. Malignant neoplasm of right upper lobe of lung ([place]) 162.3 C34.11     Narrative:  The patient returns today for routine follow-up.  The patient clinically has been stable overall since the patient was last seen in terms of respiratory symptoms. No significant discomfort or pain in the chest area. The patient has undergone a recent CT scan of the chest and presents today to review these results.                          ALLERGIES:  has No Known Allergies.  Meds: Current Outpatient Prescriptions  Medication Sig Dispense Refill  . acetaminophen (TYLENOL) 500 MG tablet Take 500 mg by mouth every 6 (six) hours as needed. For pain    . atenolol (TENORMIN) 25 MG tablet TAKE 1 TABLET BY MOUTH TWICE A DAY 180 tablet 1  . CRESTOR 40 MG tablet TAKE 1/4TH TABLET BY MOUTH 3 TIMES A WEEK 24 tablet 2  . levothyroxine (SYNTHROID, LEVOTHROID) 50 MCG tablet TAKE 1 TABLET BY MOUTH EVERY DAY 90 tablet 0  . losartan (COZAAR) 100 MG tablet TAKE 1 TABLET (100 MG TOTAL) BY MOUTH DAILY. 90 tablet 3  . metFORMIN (GLUCOPHAGE-XR) 500 MG 24 hr tablet TAKE 1 TABLET BY MOUTH EVERY DAY 90 tablet 3  . NIFEdipine (PROCARDIA-XL/ADALAT-CC/NIFEDICAL-XL) 30 MG 24 hr tablet TAKE 1 TABLET BY MOUTH EVERY MORNING 90 tablet 0  . tiotropium (SPIRIVA) 18 MCG inhalation capsule Place 1 capsule (18 mcg total) into inhaler and inhale daily. 30 capsule 9  . aspirin 81 MG tablet Take 81 mg by mouth at bedtime.      No current facility-administered medications for this encounter.    Physical Findings: The patient is in no acute distress. Patient is alert and oriented.  height is '5\' 6"'$  (1.676 m) and weight is 116 lb 8 oz (52.844 kg). Her oral temperature is 98.6 F (37 C). Her  blood pressure is 128/58 and her pulse is 67. Her respiration is 20 and oxygen saturation is 93%. .   Clear to auscultation bilaterally  Lab Findings: Lab Results  Component Value Date   WBC 7.3 08/16/2014   HGB 13.9 08/16/2014   HCT 42.6 08/16/2014   MCV 96.6 08/16/2014   PLT 224.0 08/16/2014     Radiographic Findings: Ct Chest W Contrast  09/02/2015  CLINICAL DATA:  Bi stage right upper lobe lung adenocarcinoma diagnosed in October 2010 status post radiation therapy and chemotherapy. Additional history of right breast cancer status post right mastectomy. EXAM: CT CHEST WITH CONTRAST TECHNIQUE: Multidetector CT imaging of the chest was performed during intravenous contrast administration. CONTRAST:  69m OMNIPAQUE IOHEXOL 300 MG/ML  SOLN COMPARISON:  08/28/2014 chest CT. FINDINGS: Mediastinum/Nodes: Normal heart size. No pericardial fluid/thickening. Left main, left anterior descending, left circumflex and right coronary atherosclerosis. Great vessels are normal in course and caliber. No central pulmonary emboli. Normal visualized thyroid. Normal esophagus. Right axillary surgical clips are again noted. No axillary adenopathy. No pathologically enlarged mediastinal or hilar nodes. Lungs/Pleura: No pneumothorax. No pleural effusion. Mild-to-moderate centrilobular emphysema and diffuse bronchial wall thickening. There is a sharply marginated subpleural focus of consolidation in the anterior right upper  lobe measuring 2.5 x 2.2 cm (series 5/ image 17), previously 2.5 x 2.2 cm on 08/28/2014, unchanged, most in keeping with a combination of radiation fibrosis and treated tumor. Right upper lobe 2.3 x 1.1 cm sub solid pulmonary nodule (series 5/image 20) measured 2.4 x 1.1 cm on 08/28/2014 and 2.2 x 1.1 cm on 08/15/2012 using similar measurement technique, not appreciably changed since 08/15/2012. Anterior right upper lobe 5 mm ground-glass pulmonary nodule (series 5/image 15) is stable in size since  08/15/2012. Right middle lobe 3 mm pulmonary nodule (series 5/image 42) is stable since 08/15/2012. No acute consolidative airspace disease or new significant pulmonary nodules. Stable mild scarring in the inferior lingula. Upper abdomen: Partially visualized cyst in the lateral upper right kidney, measuring at least 6.0 cm in size, the visualized portions of which are simple. Subcentimeter hypodense lesions in the upper kidneys bilaterally are too small to characterize and not appreciably changed. Right adrenal 1.2 cm nodule is stable in size since 05/01/2009, in keeping with a benign adenoma. Musculoskeletal: No aggressive appearing focal osseous lesions. Moderate degenerative changes in the thoracic spine. Moderate dextroscoliosis of the thoracic spine. Stable appearance of the right chest wall status post right mastectomy. IMPRESSION: 1. Continued stability of subpleural focal consolidation in the anterior right upper lobe, in keeping with radiation fibrosis / treated tumor. No evidence of local tumor recurrence. 2. Greater than two year stability of two right upper lobe ground-glass pulmonary nodules and of a tiny 3 mm right middle lobe pulmonary nodule, suggestive of a benign etiology. 3. No evidence of metastatic disease in the chest. 4. Coronary atherosclerosis, centrilobular emphysema and stable right adrenal adenoma. Electronically Signed   By: Ilona Sorrel M.D.   On: 09/02/2015 15:43    Impression:    The patient clinically is doing satisfactorily. I have personally reviewed the patient's recent CT scan of the chest. There is no evidence for recurrence/progressive disease. The patient is doing great right now and we discussed that she is fairly significantly beyond 5 years after her course of stereotactic body radiation treatment. No concerning findings on her recent CT scan and therefore we discussed the patient being released from our clinic.  Plan:  The patient will follow-up in our clinic on a  when necessary basis.  I spent 10 minutes with the patient today, the majority of which was spent counseling the patient on the diagnosis of cancer and coordinating care.   Jodelle Gross, M.D., Ph.D.

## 2015-09-19 ENCOUNTER — Other Ambulatory Visit: Payer: Self-pay | Admitting: Endocrinology

## 2015-09-30 ENCOUNTER — Other Ambulatory Visit: Payer: Self-pay | Admitting: Endocrinology

## 2015-10-02 ENCOUNTER — Other Ambulatory Visit: Payer: Self-pay | Admitting: Endocrinology

## 2015-10-14 ENCOUNTER — Other Ambulatory Visit: Payer: Self-pay | Admitting: Endocrinology

## 2015-10-16 ENCOUNTER — Other Ambulatory Visit: Payer: Self-pay | Admitting: Endocrinology

## 2015-11-18 ENCOUNTER — Other Ambulatory Visit: Payer: Self-pay | Admitting: Endocrinology

## 2016-01-11 ENCOUNTER — Other Ambulatory Visit: Payer: Self-pay | Admitting: Endocrinology

## 2016-01-16 ENCOUNTER — Ambulatory Visit
Admission: RE | Admit: 2016-01-16 | Discharge: 2016-01-16 | Disposition: A | Discharge status: Home / Self Care | Payer: Medicare Other | Source: Ambulatory Visit | Attending: Endocrinology | Admitting: Endocrinology

## 2016-01-16 ENCOUNTER — Encounter: Payer: Self-pay | Admitting: Endocrinology

## 2016-01-16 ENCOUNTER — Ambulatory Visit (INDEPENDENT_AMBULATORY_CARE_PROVIDER_SITE_OTHER): Payer: Medicare Other | Admitting: Endocrinology

## 2016-01-16 VITALS — BP 132/68 | HR 72 | Temp 98.1°F | Ht 65.0 in | Wt 108.6 lb

## 2016-01-16 DIAGNOSIS — E119 Type 2 diabetes mellitus without complications: Secondary | ICD-10-CM | POA: Diagnosis not present

## 2016-01-16 DIAGNOSIS — R918 Other nonspecific abnormal finding of lung field: Secondary | ICD-10-CM | POA: Diagnosis not present

## 2016-01-16 DIAGNOSIS — Z0000 Encounter for general adult medical examination without abnormal findings: Secondary | ICD-10-CM

## 2016-01-16 DIAGNOSIS — J439 Emphysema, unspecified: Secondary | ICD-10-CM

## 2016-01-16 DIAGNOSIS — E890 Postprocedural hypothyroidism: Secondary | ICD-10-CM | POA: Diagnosis not present

## 2016-01-16 DIAGNOSIS — M810 Age-related osteoporosis without current pathological fracture: Secondary | ICD-10-CM

## 2016-01-16 LAB — BASIC METABOLIC PANEL
BUN: 18 mg/dL (ref 6–23)
CO2: 35 mEq/L — ABNORMAL HIGH (ref 19–32)
Calcium: 9.9 mg/dL (ref 8.4–10.5)
Chloride: 103 mEq/L (ref 96–112)
Creatinine, Ser: 0.8 mg/dL (ref 0.40–1.20)
GFR: 73 mL/min (ref >60.00)
Glucose, Bld: 105 mg/dL — ABNORMAL HIGH (ref 70–99)
Potassium: 4.5 mEq/L (ref 3.5–5.1)
Sodium: 142 mEq/L (ref 135–145)

## 2016-01-16 LAB — CBC WITH DIFFERENTIAL/PLATELET
Basophils Absolute: 0 10*3/uL (ref 0.0–0.1)
Basophils Relative: 0.6 % (ref 0.0–3.0)
Eosinophils Absolute: 0.1 10*3/uL (ref 0.0–0.7)
Eosinophils Relative: 1.9 % (ref 0.0–5.0)
HCT: 42.3 % (ref 36.0–46.0)
Hemoglobin: 14 g/dL (ref 12.0–15.0)
Lymphocytes Relative: 19.2 % (ref 12.0–46.0)
Lymphs Abs: 1.2 10*3/uL (ref 0.7–4.0)
MCHC: 33 g/dL (ref 30.0–36.0)
MCV: 95.2 fl (ref 78.0–100.0)
Monocytes Absolute: 0.6 10*3/uL (ref 0.1–1.0)
Monocytes Relative: 9.9 % (ref 3.0–12.0)
Neutro Abs: 4.4 10*3/uL (ref 1.4–7.7)
Neutrophils Relative %: 68.4 % (ref 43.0–77.0)
Platelets: 222 10*3/uL (ref 150.0–400.0)
RBC: 4.45 Mil/uL (ref 3.87–5.11)
RDW: 14.2 % (ref 11.5–15.5)
WBC: 6.4 10*3/uL (ref 4.0–10.5)

## 2016-01-16 LAB — HEPATIC FUNCTION PANEL
ALT: 10 U/L (ref 0–35)
AST: 17 U/L (ref 0–37)
Albumin: 4.4 g/dL (ref 3.5–5.2)
Alkaline Phosphatase: 47 U/L (ref 39–117)
Bilirubin, Direct: 0.1 mg/dL (ref 0.0–0.3)
Total Bilirubin: 0.7 mg/dL (ref 0.2–1.2)
Total Protein: 7.2 g/dL (ref 6.0–8.3)

## 2016-01-16 LAB — MICROALBUMIN / CREATININE URINE RATIO
Creatinine,U: 232.6 mg/dL
Microalb Creat Ratio: 3.6 mg/g (ref 0.0–30.0)
Microalb, Ur: 8.3 mg/dL — ABNORMAL HIGH (ref 0.0–1.9)

## 2016-01-16 LAB — LIPID PANEL
Cholesterol: 208 mg/dL — ABNORMAL HIGH (ref 0–200)
HDL: 65.3 mg/dL (ref >39.00)
LDL Cholesterol: 119 mg/dL — ABNORMAL HIGH (ref 0–99)
NonHDL: 142.86
Total CHOL/HDL Ratio: 3
Triglycerides: 119 mg/dL (ref 0.0–149.0)
VLDL: 23.8 mg/dL (ref 0.0–40.0)

## 2016-01-16 LAB — TSH: TSH: 1.24 u[IU]/mL (ref 0.35–4.50)

## 2016-01-16 LAB — VITAMIN D 25 HYDROXY (VIT D DEFICIENCY, FRACTURES): VITD: 18.64 ng/mL — ABNORMAL LOW (ref 30.00–100.00)

## 2016-01-16 MED ORDER — VITAMIN D (ERGOCALCIFEROL) 1.25 MG (50000 UNIT) PO CAPS: 50000.0000 [IU] | ORAL_CAPSULE | ORAL | Status: DC

## 2016-01-16 NOTE — Patient Instructions (Addendum)
please consider these measures for your health:  minimize alcohol.  do not use tobacco products.  have a colonoscopy at least every 10 years from age 80.  Women should have an annual mammogram from age 28.  keep firearms safely stored.  always use seat belts.  have working smoke alarms in your home.  see an eye doctor and dentist regularly.  never drive under the influence of alcohol or drugs (including prescription drugs).  those with fair skin should take precautions against the sun.   good diet and exercise significantly improve the control of your diabetes.  please let me know if you wish to be referred to a dietician.  high blood sugar is very risky to your health.  you should see an eye doctor and dentist every year.  It is very important to get all recommended vaccinations.  it is critically important to prevent falling down (keep floor areas well-lit, dry, and free of loose objects.  If you have a cane, walker, or wheelchair, you should use it, even for short trips around the house.  Wear flat-soled shoes.  Also, try not to rush) blood tests and a chest x-ray are requested for you today.  We'll let you know about the results.   Please come back for a follow-up appointment in 6 months.

## 2016-01-16 NOTE — Progress Notes (Signed)
Subjective:    Patient ID: Charlotte Harris, female    DOB: 1931/11/12, 82 y.o.   MRN: [id number]  HPI Pt is here for regular wellness examination, and is feeling pretty well in general, and says chronic med probs are stable, except as noted below Past Medical History  Diagnosis Date  . Hypertension   . ASYMPTOMATIC POSTMENOPAUSAL STATUS 07/02/2009  . BREAST CANCER, HX OF 04/16/2007  . COLONIC POLYPS, HX OF 04/16/2007  . COPD 05/17/2008  . HEMATURIA, HX OF 04/16/2007  . HYPERLIPIDEMIA 04/16/2007  . HYPERTENSION 04/16/2007  . HYPOTHYROIDISM, POST-RADIATION 06/07/2008  . OSTEOPOROSIS 07/02/2009  . PERIPHERAL EDEMA 02/15/2009  . Osteoarthritis   . AODM 06/21/2007  . History of radiation therapy 07/30/09 to 08/09/09    RUL lung  . Cancer ([place])   . ADENOCARCINOMA, LUNG 07/03/2010    RUL  . Breast cancer ([place]) 1991    R mastectomy, no chemo or radiation  . Wrist fracture, left     Past Surgical History  Procedure Laterality Date  . Mastectomy  1991    right  . Tonsillectomy  1955  . 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 Lung biopsy  05/22/2009    RUL lung=Adenocarcinoma  . Breast surgery    . Eye surgery      Social History   Social History  . Marital Status: Widowed    Spouse Name: N/A  . Number of Children: N/A  . Years of Education: N/A   Occupational History  . Retired (worked Lexicographer)    Social History Main Topics  . Smoking status: Former Smoker -- 1.00 packs/day for 50 years    Types: Cigarettes    Quit date: 08/03/2009  . Smokeless tobacco: Never Used  . Alcohol Use: 1.2 oz/week    2 Glasses of wine per week  . Drug Use: No  . Sexual Activity: Not on file   Other Topics Concern  . Not on file   Social History Narrative   Widowed 2005   Quit smoking 2010   No other exposures, no TB hx    Current Outpatient Prescriptions on File Prior to Visit  Medication Sig Dispense Refill  . acetaminophen (TYLENOL) 500 MG  tablet Take 500 mg by mouth every 6 (six) hours as needed. For pain    . aspirin 81 MG tablet Take 81 mg by mouth at bedtime.     Marland Kitchen atenolol (TENORMIN) 25 MG tablet TAKE 1 TABLET BY MOUTH TWICE A DAY 180 tablet 1  . CRESTOR 40 MG tablet TAKE 1/4TH TABLET BY MOUTH 3 TIMES A WEEK 24 tablet 2  . levothyroxine (SYNTHROID, LEVOTHROID) 50 MCG tablet TAKE 1 TABLET BY MOUTH EVERY DAY 90 tablet 0  . losartan (COZAAR) 100 MG tablet TAKE 1 TABLET (100 MG TOTAL) BY MOUTH DAILY. 90 tablet 3  . metFORMIN (GLUCOPHAGE-XR) 500 MG 24 hr tablet TAKE 1 TABLET BY MOUTH EVERY DAY 90 tablet 3  . NIFEdipine (PROCARDIA-XL/ADALAT-CC/NIFEDICAL-XL) 30 MG 24 hr tablet TAKE 1 TABLET BY MOUTH EVERY MORNING 90 tablet 0  . SPIRIVA HANDIHALER 18 MCG inhalation capsule PLACE 1 CAPSULE (18 MCG TOTAL) INTO INHALER AND INHALE DAILY. 30 capsule 4   No current facility-administered medications on file prior to visit.    No Known Allergies  Family History  Problem Relation Age of Onset  . Heart attack Mother 67  . Heart disease Mother   . Cancer Sister     "  throat" cancer  . Heart disease Other     CAD  . Hypertension Other   . Colon cancer Neg Hx   . Stomach cancer Neg Hx   . Heart disease Father   . Cancer Brother   . Heart disease Sister   . Cancer Brother     BP 132/68 mmHg  Pulse 72  Temp(Src) 98.1 F (36.7 C)  Ht '5\' 5"'$  (1.651 m)  Wt 108 lb 9.6 oz (49.261 kg)  BMI 18.07 kg/m2  SpO2 93%   Review of Systems  Constitutional: Negative for fever.       She has lost a few lbs  HENT: Negative for ear pain.   Eyes: Negative for itching.  Respiratory: Negative for shortness of breath.   Cardiovascular: Negative for chest pain.  Gastrointestinal: Negative for blood in stool.  Endocrine: Negative for cold intolerance.  Genitourinary: Negative for hematuria.  Musculoskeletal: Negative for back pain.  Skin: Negative for rash.  Allergic/Immunologic: Negative for environmental allergies.  Neurological: Negative  for syncope and numbness.  Hematological: Bruises/bleeds easily.  Psychiatric/Behavioral: Negative for hallucinations.       Objective:   Physical Exam VS: see vs page GEN: no distress HEAD: head: no deformity eyes: no periorbital swelling, no proptosis. external nose and ears are normal mouth: no lesion seen NECK: supple, thyroid is not enlarged CHEST WALL: no deformity LUNGS: clear to auscultation, except for decreased bs at the bases CV: reg rate and rhythm, no murmur ABD: abdomen is soft, nontender.  no hepatosplenomegaly.  not distended.  no hernia MUSCULOSKELETAL: muscle bulk and strength are grossly normal.  no obvious joint swelling.  gait is normal and steady EXTEMITIES: no deformity, except for bilat hammer toes.  no ulcer on the feet.  feet are of normal color and temp.  no edema, but there are bilat vv's PULSES: dorsalis pedis intact bilat.  no carotid bruit NEURO:  cn 2-12 grossly intact.   readily moves all 4's.  sensation is intact to touch on the feet SKIN:  Normal texture and temperature.  No rash or suspicious lesion is visible.   NODES:  None palpable at the neck. PSYCH: alert, well-oriented.  Does not appear anxious nor depressed.    i personally reviewed electrocardiogram tracing (today): Indication: DM Impression: normal     Assessment & Plan:  Wellness visit today, with problems stable, except as noted.    Subjective:   Patient here for Medicare annual wellness visit and management of other chronic and acute problems.     Risk factors: advanced age    73 of Physicians Providing Medical Care to Patient:  See "snapshot"   Activities of Daily Living: In your present state of health, do you have any difficulty performing the following activities?:  Preparing food and eating?: No  Bathing yourself: No  Getting dressed: No  Using the toilet:No  Moving around from place to place: No  In the past year have you fallen or had a near fall?: No      Home Safety: Has smoke detector and wears seat belts. Firearms are safely stored. No excess sun exposure.    Diet and Exercise  Current exercise habits: pt says good Dietary issues discussed: pt reports a healthy diet   Depression Screen  Q1: Over the past two weeks, have you felt down, depressed or hopeless? no  Q2: Over the past two weeks, have you felt little interest or pleasure in doing things? no   The following portions  of the patient's history were reviewed and updated as appropriate: allergies, current medications, past family history, past medical history, past social history, past surgical history and problem list.   Review of Systems  Denies hearing loss, and visual loss Objective:   Vision:  Sees optometrist Dr Einar Gip, and declines [facility] today Hearing: grossly normal Body mass index:  See vs page Msk: pt easily and quickly performs "get-up-and-go" from a sitting position Cognitive Impairment Assessment: cognition, memory and judgment appear normal.  remembers 2/3 at 5 minutes.  excellent recall.  can easily read and write a sentence.  alert and oriented x 3   Assessment:   Medicare wellness utd on preventive parameters    Plan:   During the course of the visit the patient was educated and counseled about appropriate screening and preventive services including:       Fall prevention   Screening mammography  Bone densitometry screening  Diabetes screening  Nutrition counseling   Vaccines / LABS Zostavax / Pneumococcal Vaccine  Today She declines colonoscopy   Patient Instructions (the written plan) was given to the patient.  Renato Shin, MD

## 2016-01-16 NOTE — Progress Notes (Signed)
we discussed code status.  pt requests full code, but would not want to be started or maintained on artificial life-support measures if there was not a reasonable chance of recovery 

## 2016-01-17 ENCOUNTER — Other Ambulatory Visit: Payer: Self-pay | Admitting: Endocrinology

## 2016-01-17 LAB — PTH, INTACT AND CALCIUM
Calcium: 9.8 mg/dL (ref 8.4–10.5)
PTH: 88 pg/mL — ABNORMAL HIGH (ref 14–64)

## 2016-02-08 IMAGING — CT CT CHEST W/ CM
2 of 4 series · 15 of 36 positions shown, 18 images · IV contrast (omnipaque)
Comparison: CT chest dated 09/26/2013

CLINICAL DATA: Left upper lobe lung cancer diagnosed 8646 status
post chemotherapy/ XRT. History of right breast cancer.

EXAM:
CT CHEST WITH CONTRAST
TECHNIQUE: Multidetector CT imaging of the chest was performed during
intravenous contrast administration.
CONTRAST:  80mL OMNIPAQUE IOHEXOL 300 MG/ML  SOLN

[Series 2: chest with st · axial · 0.74mm/px · z∈[-315,-65]mm · 12 of 60 slices shown, 15 images]
[im 5/60  mediastinal]
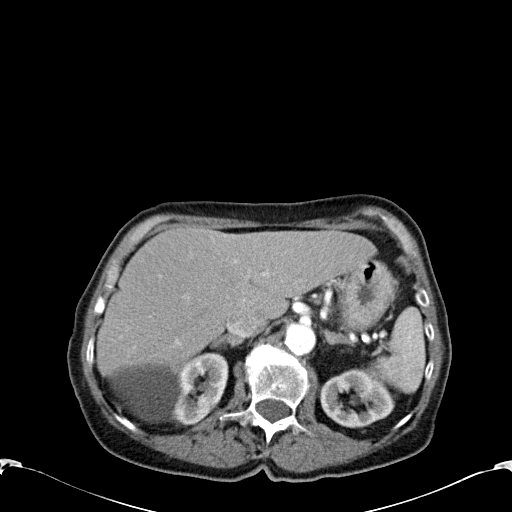
[im 5/60  lung]
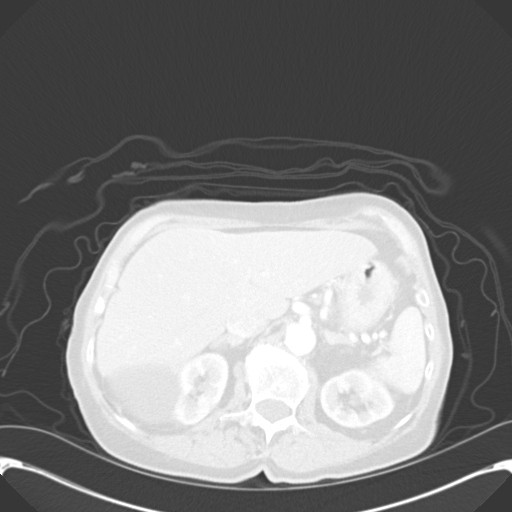
[im 10/60  lung]
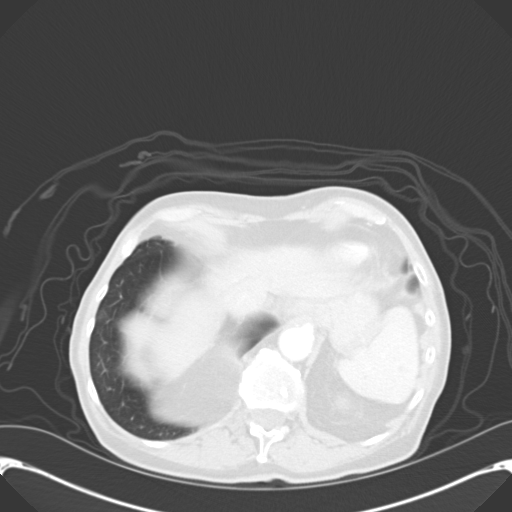
[im 14/60  lung]
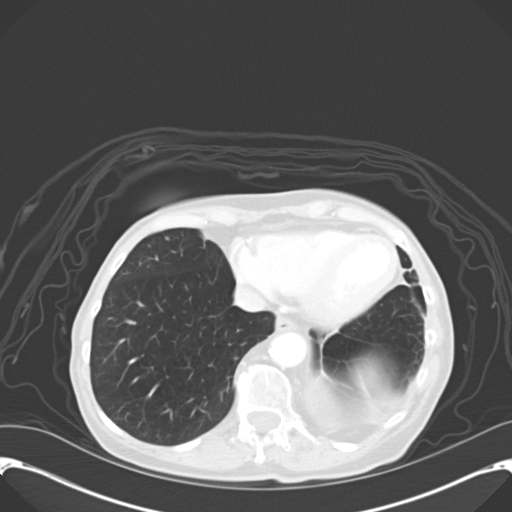
[im 19/60  lung]
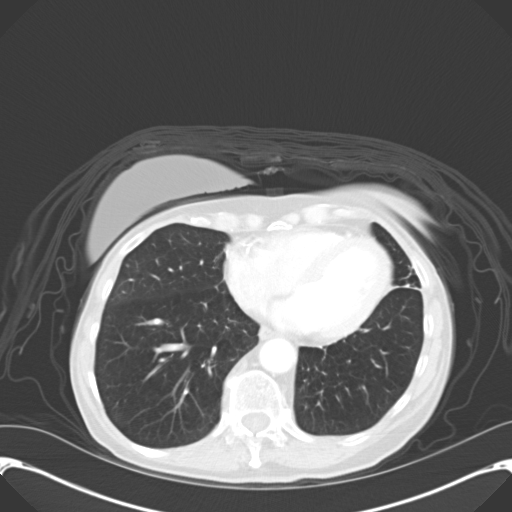
[im 23/60  mediastinal]
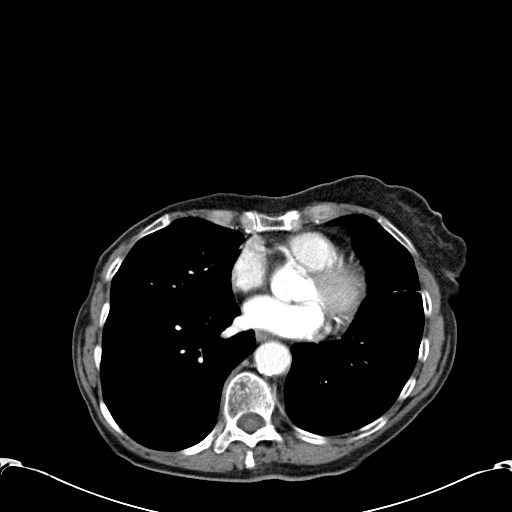
[im 23/60  lung]
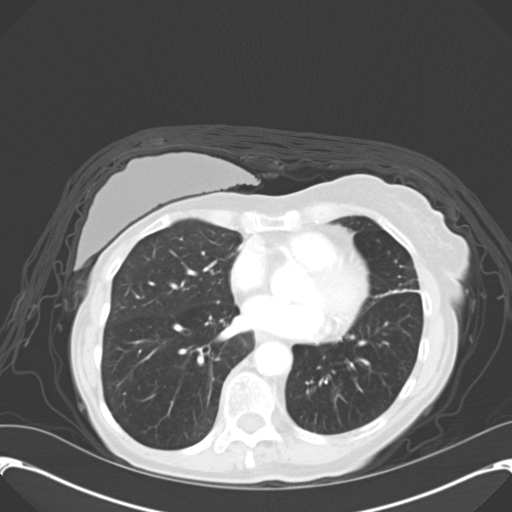
[im 28/60  lung]
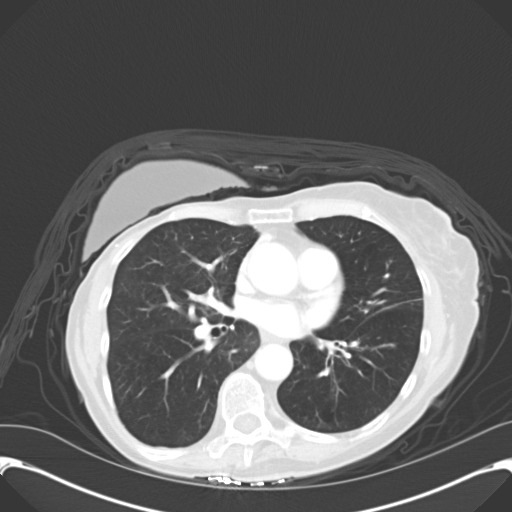
[im 32/60  lung]
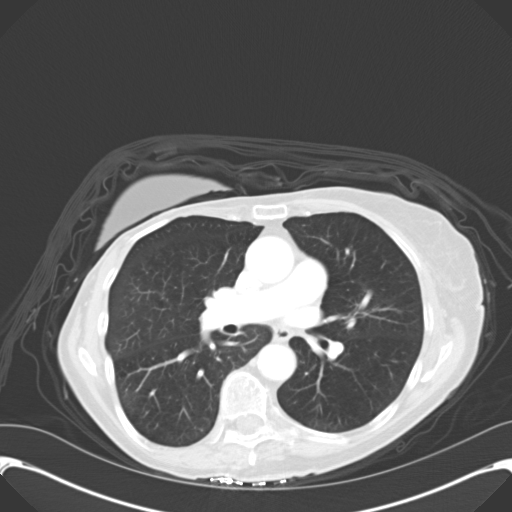
[im 37/60  lung]
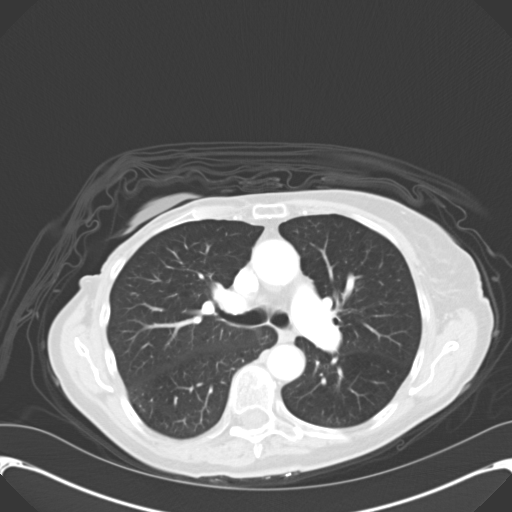
[im 41/60  mediastinal]
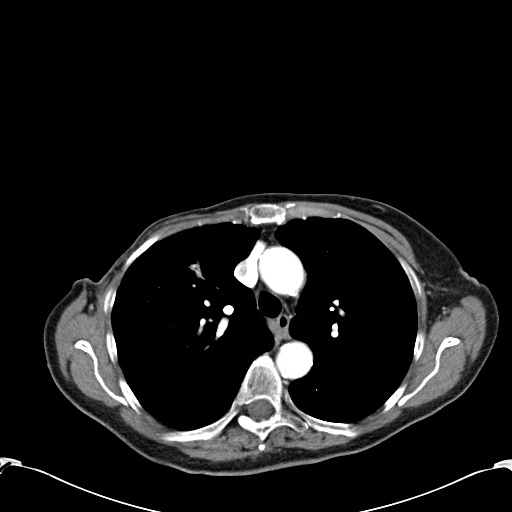
[im 41/60  lung]
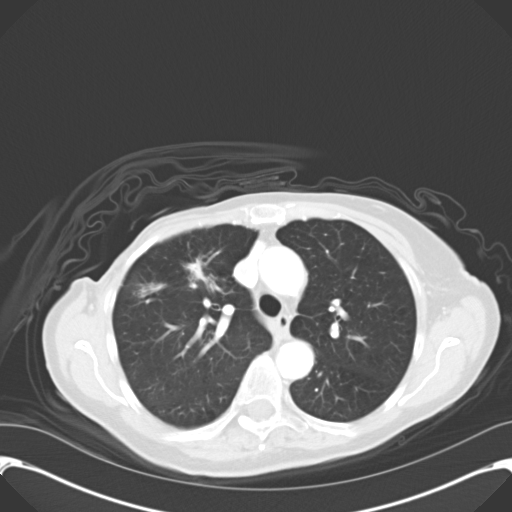
[im 46/60  lung]
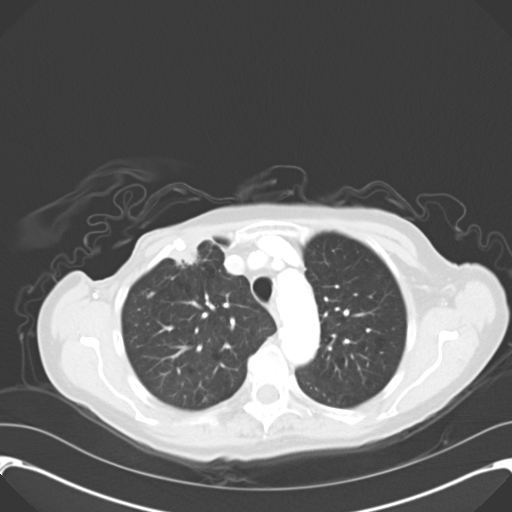
[im 50/60  lung]
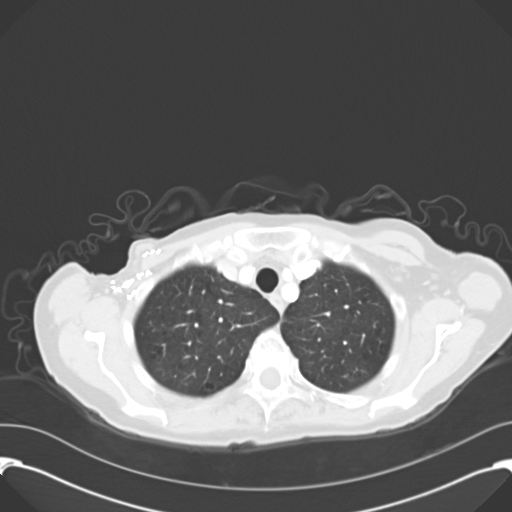
[im 55/60  lung]
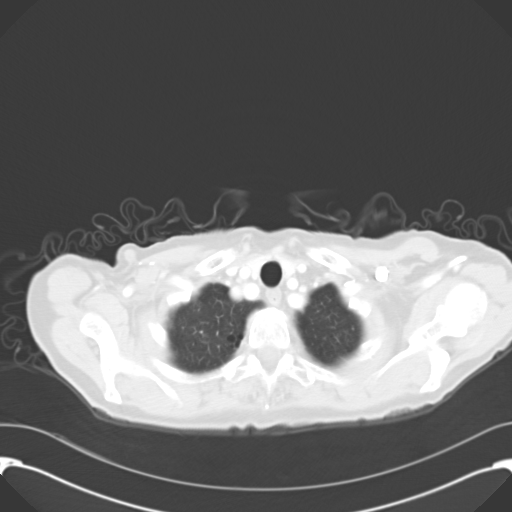

[Series 602: <mpr thick range> · coronal · 0.74mm/px · 3 of 77 slices shown]
[im 16/77  lung]
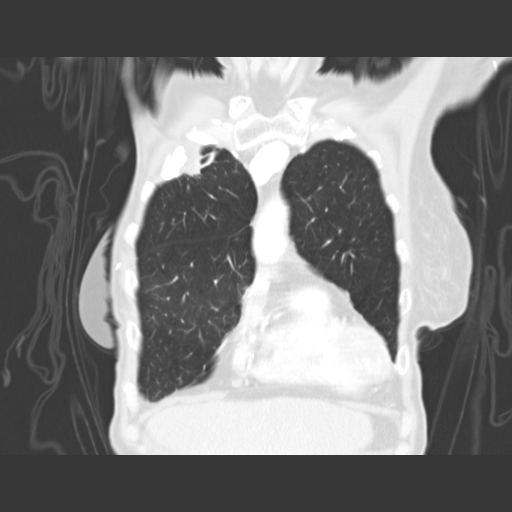
[im 31/77  lung]
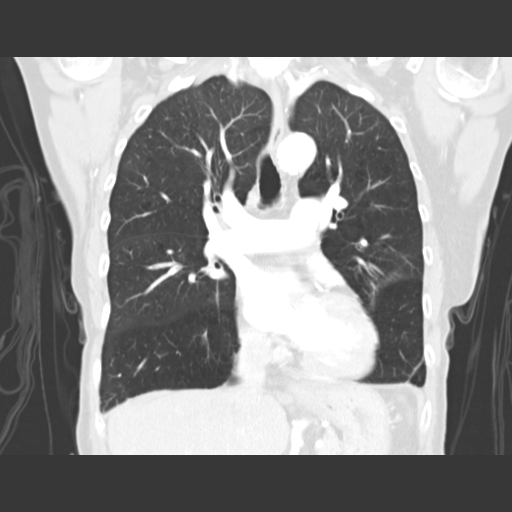
[im 46/77  lung]
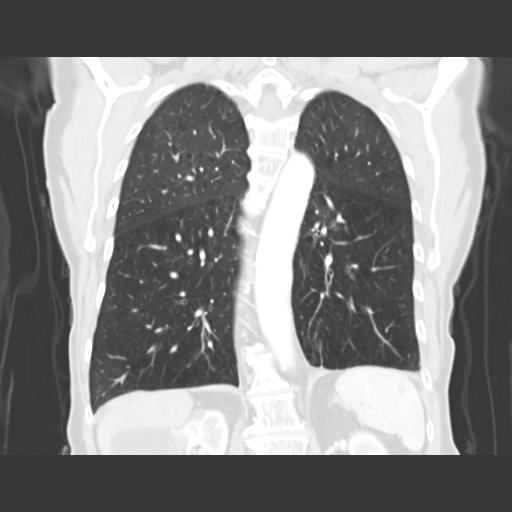

[15 of 36 positions shown; findings below may reference images not displayed]

FINDINGS: Mediastinum/Nodes: Heart is normal in size. No pericardial effusion.

Coronary atherosclerosis. Atherosclerotic calcifications of the
aortic arch. Ectasia of the ascending thoracic aorta, measuring
cm.

No suspicious mediastinal, hilar, or axillary lymphadenopathy.

Visualized thyroid is unremarkable.

Status post right mastectomy with right axillary lymph node
dissection.

Lungs/Pleura: Suspected radiation changes with stable 2.5 x 2.2 cm
subpleural opacity in the anterior right upper lobe (series 5/ image
17).

Additional areas of nodular scarring laterally in the right upper
lobe are unchanged (series 5/images 15 and 20).

Mild centrilobular and paraseptal emphysematous changes. Mild
scarring in the lingula. No new/suspicious pulmonary nodules. No
pleural effusion or pneumothorax.

Upper abdomen: Visualized upper abdomen is notable for vascular
calcifications and a 6.1 cm lateral right upper pole renal cyst.

Musculoskeletal: Degenerative changes of the thoracic spine with
dextroscoliosis.
IMPRESSION: Radiation changes with stable subpleural opacity in the anterior
right upper lobe.

Additional areas of nodular scarring in the lateral right upper lobe
are unchanged.

No evidence of new/progressive disease in the chest.

## 2016-02-12 ENCOUNTER — Ambulatory Visit (INDEPENDENT_AMBULATORY_CARE_PROVIDER_SITE_OTHER)
Admission: RE | Admit: 2016-02-12 | Discharge: 2016-02-12 | Disposition: A | Discharge status: Home / Self Care | Payer: Medicare Other | Source: Ambulatory Visit | Attending: Endocrinology | Admitting: Endocrinology

## 2016-02-12 DIAGNOSIS — M810 Age-related osteoporosis without current pathological fracture: Secondary | ICD-10-CM | POA: Diagnosis not present

## 2016-02-17 ENCOUNTER — Telehealth: Payer: Self-pay | Admitting: Endocrinology

## 2016-02-17 MED ORDER — ALENDRONATE SODIUM 70 MG PO TABS
70.0000 mg | ORAL_TABLET | ORAL | Status: DC
Start: 2016-02-17 — End: 2017-07-21

## 2016-02-17 NOTE — Telephone Encounter (Signed)
Left a vm advising the pt we have submitted the prescription. Requested a call back if the pt would like to discuss.

## 2016-02-17 NOTE — Telephone Encounter (Signed)
PT called and said that she got Dr, Cordelia Pen message on [place] and does want the medication for Osteoporosis sent into [facility] on [address]

## 2016-02-17 NOTE — Telephone Encounter (Signed)
Attempted to reach the pt. Pt was unavailable. Will try again at a later time.  

## 2016-02-17 NOTE — Telephone Encounter (Signed)
done

## 2016-02-21 ENCOUNTER — Other Ambulatory Visit: Payer: Self-pay | Admitting: Endocrinology

## 2016-03-13 ENCOUNTER — Other Ambulatory Visit: Payer: Self-pay | Admitting: Endocrinology

## 2016-03-14 ENCOUNTER — Other Ambulatory Visit: Payer: Self-pay | Admitting: Endocrinology

## 2016-03-23 ENCOUNTER — Other Ambulatory Visit: Payer: Self-pay | Admitting: Endocrinology

## 2016-05-18 ENCOUNTER — Other Ambulatory Visit: Payer: Self-pay | Admitting: Endocrinology

## 2016-06-21 ENCOUNTER — Other Ambulatory Visit: Payer: Self-pay | Admitting: Endocrinology

## 2016-07-13 ENCOUNTER — Other Ambulatory Visit: Payer: Self-pay | Admitting: Endocrinology

## 2016-07-16 ENCOUNTER — Encounter: Payer: Self-pay | Admitting: Endocrinology

## 2016-07-16 ENCOUNTER — Ambulatory Visit (INDEPENDENT_AMBULATORY_CARE_PROVIDER_SITE_OTHER): Payer: Medicare Other | Admitting: Endocrinology

## 2016-07-16 VITALS — BP 122/80 | HR 80 | Ht 65.0 in | Wt 110.0 lb

## 2016-07-16 DIAGNOSIS — E559 Vitamin D deficiency, unspecified: Secondary | ICD-10-CM | POA: Diagnosis not present

## 2016-07-16 DIAGNOSIS — E119 Type 2 diabetes mellitus without complications: Secondary | ICD-10-CM

## 2016-07-16 LAB — LIPID PANEL
Cholesterol: 200 mg/dL (ref 0–200)
HDL: 72.4 mg/dL (ref >39.00)
LDL Cholesterol: 109 mg/dL — ABNORMAL HIGH (ref 0–99)
NonHDL: 127.35
Total CHOL/HDL Ratio: 3
Triglycerides: 94 mg/dL (ref 0.0–149.0)
VLDL: 18.8 mg/dL (ref 0.0–40.0)

## 2016-07-16 LAB — VITAMIN D 25 HYDROXY (VIT D DEFICIENCY, FRACTURES): VITD: 20.34 ng/mL — ABNORMAL LOW (ref 30.00–100.00)

## 2016-07-16 MED ORDER — VITAMIN D (ERGOCALCIFEROL) 1.25 MG (50000 UNIT) PO CAPS: 50000.0000 [IU] | ORAL_CAPSULE | ORAL | 0 refills | Status: DC

## 2016-07-16 NOTE — Patient Instructions (Addendum)
blood tests are requested for you today.  We'll let you know about the results. Please come back for a regular physical appointment in 6 months (must be after 01/15/17)

## 2016-07-16 NOTE — Progress Notes (Signed)
Subjective:    Patient ID: Charlotte Harris, female    DOB: [birth year], 82 y.o.   MRN: [id number]  HPI  The state of at least three ongoing medical problems is addressed today, with interval history of each noted here: Pt returns for f/u of diabetes mellitus: DM type: 2 Dx'ed: 2130 Complications: nephropathy Therapy: metformin GDM: never DKA: never.  Severe hypoglycemia: never.  Pancreatitis: never.  Other: she has never taken insulin.   Interval history: denies weight change.  Vit-D deficiency: she denies cramps.  Dyslipidemia: she denies chest pain.  Past Medical History:  Diagnosis Date  . ADENOCARCINOMA, LUNG 07/03/2010   RUL  . AODM 06/21/2007  . ASYMPTOMATIC POSTMENOPAUSAL STATUS 07/02/2009  . Breast cancer ([place]) 1991   R mastectomy, no chemo or radiation  . BREAST CANCER, HX OF 04/16/2007  . Cancer ([place])   . COLONIC POLYPS, HX OF 04/16/2007  . COPD 05/17/2008  . HEMATURIA, HX OF 04/16/2007  . History of radiation therapy 07/30/09 to 08/09/09   RUL lung  . HYPERLIPIDEMIA 04/16/2007  . Hypertension   . HYPERTENSION 04/16/2007  . HYPOTHYROIDISM, POST-RADIATION 06/07/2008  . Osteoarthritis   . OSTEOPOROSIS 07/02/2009  . PERIPHERAL EDEMA 02/15/2009  . Wrist fracture, left     Past Surgical History:  Procedure Laterality Date  . BREAST SURGERY    . EYE SURGERY    . 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 LUNG BIOPSY  05/22/2009   RUL lung=Adenocarcinoma  . MASTECTOMY  1991   right  . TONSILLECTOMY  1955    Social History   Social History  . Marital status: Widowed    Spouse name: N/A  . Number of children: N/A  . Years of education: N/A   Occupational History  . Retired (worked Lexicographer) Retired   Social History Main Topics  . Smoking status: Former Smoker    Packs/day: 1.00    Years: 50.00    Types: Cigarettes    Quit date: 08/03/2009  . Smokeless tobacco: Never Used  . Alcohol use 1.2 oz/week    2 Glasses of wine  per week  . Drug use: No  . Sexual activity: Not on file   Other Topics Concern  . Not on file   Social History Narrative   Widowed 2005   Quit smoking 2010   No other exposures, no TB hx    Current Outpatient Prescriptions on File Prior to Visit  Medication Sig Dispense Refill  . acetaminophen (TYLENOL) 500 MG tablet Take 500 mg by mouth every 6 (six) hours as needed. For pain    . aspirin 81 MG tablet Take 81 mg by mouth at bedtime.     Marland Kitchen atenolol (TENORMIN) 25 MG tablet TAKE 1 TABLET BY MOUTH TWICE A DAY 180 tablet 0  . levothyroxine (SYNTHROID, LEVOTHROID) 50 MCG tablet TAKE 1 TABLET BY MOUTH EVERY DAY 90 tablet 1  . losartan (COZAAR) 100 MG tablet TAKE 1 TABLET (100 MG TOTAL) BY MOUTH DAILY. 90 tablet 3  . metFORMIN (GLUCOPHAGE-XR) 500 MG 24 hr tablet TAKE 1 TABLET BY MOUTH EVERY DAY 90 tablet 3  . NIFEdipine (PROCARDIA-XL/ADALAT-CC/NIFEDICAL-XL) 30 MG 24 hr tablet TAKE 1 TABLET BY MOUTH EVERY MORNING 90 tablet 0  . SPIRIVA HANDIHALER 18 MCG inhalation capsule PLACE 1 CAPSULE (18 MCG TOTAL) INTO INHALER AND INHALE DAILY. 30 capsule 11  . alendronate (FOSAMAX) 70 MG tablet Take 1 tablet (70 mg total) by mouth every  7 (seven) days. Take with a full glass of water on an empty stomach. (Patient not taking: Reported on 07/16/2016) 4 tablet 11  . CRESTOR 40 MG tablet TAKE 1/4TH TABLET BY MOUTH 3 TIMES A WEEK (Patient not taking: Reported on 07/16/2016) 24 tablet 2   No current facility-administered medications on file prior to visit.     No Known Allergies  Family History  Problem Relation Age of Onset  . Heart attack Mother 46  . Heart disease Mother   . Cancer Sister     "throat" cancer  . Heart disease Other     CAD  . Hypertension Other   . Colon cancer Neg Hx   . Stomach cancer Neg Hx   . Heart disease Father   . Cancer Brother   . Heart disease Sister   . Cancer Brother     BP 122/80   Pulse 80   Ht '5\' 5"'$  (1.651 m)   Wt 110 lb (49.9 kg)   BMI 18.30 kg/m     Review of Systems Denies sob.  She has slight leg swelling.     Objective:   Physical Exam VITAL SIGNS:  See vs page GENERAL: no distress Pulses: dorsalis pedis intact bilat.   MSK: no deformity of the feet, except for hammer toes CV: trace bilat leg edema. bilat vv's. Skin:  no ulcer on the feet.  normal color and temp on the feet. Neuro: sensation is intact to touch on the feet Ext: There is bilateral onychomycosis of the toenails. Lab Results  Component Value Date   CHOL 208 (H) 01/16/2016   HDL 65.30 01/16/2016   LDLCALC 119 (H) 01/16/2016   LDLDIRECT 155.6 07/11/2013   TRIG 119.0 01/16/2016   CHOLHDL 3 01/16/2016     Lab Results  Component Value Date   CREATININE 0.80 01/16/2016   BUN 18 01/16/2016   NA 142 01/16/2016   K 4.5 01/16/2016   CL 103 01/16/2016   CO2 35 (H) 01/16/2016  glucose=105    Assessment & Plan:  Dyslipidemia: we discussed high HDL. I offered to rx Vit-D deficiency: persistent: I refilled rx for ergocalciferol Type 2 DM, with nephropathy: well-controlled.  Please continue the same medication.

## 2016-07-25 ENCOUNTER — Other Ambulatory Visit: Payer: Self-pay | Admitting: Endocrinology

## 2016-08-13 ENCOUNTER — Other Ambulatory Visit: Payer: Self-pay | Admitting: Endocrinology

## 2016-08-13 DIAGNOSIS — E119 Type 2 diabetes mellitus without complications: Secondary | ICD-10-CM | POA: Diagnosis not present

## 2016-08-13 LAB — HM DIABETES EYE EXAM

## 2016-09-08 ENCOUNTER — Other Ambulatory Visit: Payer: Self-pay | Admitting: Endocrinology

## 2016-09-08 NOTE — Telephone Encounter (Signed)
Please advise if ok to refill. Thanks 

## 2016-09-22 ENCOUNTER — Other Ambulatory Visit: Payer: Self-pay | Admitting: Endocrinology

## 2016-09-23 ENCOUNTER — Other Ambulatory Visit: Payer: Self-pay | Admitting: Endocrinology

## 2016-10-28 ENCOUNTER — Other Ambulatory Visit: Payer: Self-pay | Admitting: Endocrinology

## 2016-11-07 ENCOUNTER — Other Ambulatory Visit: Payer: Self-pay | Admitting: Endocrinology

## 2016-12-19 ENCOUNTER — Other Ambulatory Visit: Payer: Self-pay | Admitting: Endocrinology

## 2017-01-09 ENCOUNTER — Other Ambulatory Visit: Payer: Self-pay | Admitting: Endocrinology

## 2017-01-25 ENCOUNTER — Ambulatory Visit (INDEPENDENT_AMBULATORY_CARE_PROVIDER_SITE_OTHER): Payer: Medicare Other | Admitting: Endocrinology

## 2017-01-25 ENCOUNTER — Encounter: Payer: Self-pay | Admitting: Endocrinology

## 2017-01-25 VITALS — BP 112/60 | HR 88 | Ht 65.0 in | Wt 104.0 lb

## 2017-01-25 DIAGNOSIS — E119 Type 2 diabetes mellitus without complications: Secondary | ICD-10-CM

## 2017-01-25 DIAGNOSIS — C50911 Malignant neoplasm of unspecified site of right female breast: Secondary | ICD-10-CM

## 2017-01-25 DIAGNOSIS — Z0000 Encounter for general adult medical examination without abnormal findings: Secondary | ICD-10-CM | POA: Diagnosis not present

## 2017-01-25 DIAGNOSIS — M25551 Pain in right hip: Secondary | ICD-10-CM

## 2017-01-25 DIAGNOSIS — E890 Postprocedural hypothyroidism: Secondary | ICD-10-CM | POA: Diagnosis not present

## 2017-01-25 DIAGNOSIS — E559 Vitamin D deficiency, unspecified: Secondary | ICD-10-CM | POA: Diagnosis not present

## 2017-01-25 DIAGNOSIS — M810 Age-related osteoporosis without current pathological fracture: Secondary | ICD-10-CM | POA: Diagnosis not present

## 2017-01-25 LAB — LIPID PANEL
Cholesterol: 207 mg/dL — ABNORMAL HIGH (ref 0–200)
HDL: 70.4 mg/dL (ref >39.00)
LDL Cholesterol: 116 mg/dL — ABNORMAL HIGH (ref 0–99)
NonHDL: 136.95
Total CHOL/HDL Ratio: 3
Triglycerides: 106 mg/dL (ref 0.0–149.0)
VLDL: 21.2 mg/dL (ref 0.0–40.0)

## 2017-01-25 LAB — URINALYSIS, ROUTINE W REFLEX MICROSCOPIC
Bilirubin Urine: NEGATIVE
Hgb urine dipstick: NEGATIVE
Ketones, ur: NEGATIVE
Leukocytes, UA: NEGATIVE
Nitrite: NEGATIVE
RBC / HPF: NONE SEEN
Specific Gravity, Urine: 1.02
Total Protein, Urine: NEGATIVE
Urine Glucose: NEGATIVE
Urobilinogen, UA: 0.2
pH: 6 (ref 5.0–8.0)

## 2017-01-25 LAB — HEPATIC FUNCTION PANEL
ALT: 8 U/L (ref 0–35)
AST: 16 U/L (ref 0–37)
Albumin: 4.5 g/dL (ref 3.5–5.2)
Alkaline Phosphatase: 36 U/L — ABNORMAL LOW (ref 39–117)
Bilirubin, Direct: 0 mg/dL (ref 0.0–0.3)
Total Bilirubin: 0.5 mg/dL (ref 0.2–1.2)
Total Protein: 7.1 g/dL (ref 6.0–8.3)

## 2017-01-25 LAB — TSH: TSH: 0.98 u[IU]/mL (ref 0.35–4.50)

## 2017-01-25 LAB — CBC WITH DIFFERENTIAL/PLATELET
Basophils Absolute: 0 10*3/uL (ref 0.0–0.1)
Basophils Relative: 0.7 % (ref 0.0–3.0)
Eosinophils Absolute: 0.1 10*3/uL (ref 0.0–0.7)
Eosinophils Relative: 1.6 % (ref 0.0–5.0)
HCT: 40.5 % (ref 36.0–46.0)
Hemoglobin: 13.5 g/dL (ref 12.0–15.0)
Lymphocytes Relative: 23.2 % (ref 12.0–46.0)
Lymphs Abs: 1.6 10*3/uL (ref 0.7–4.0)
MCHC: 33.4 g/dL (ref 30.0–36.0)
MCV: 96.5 fl (ref 78.0–100.0)
Monocytes Absolute: 0.8 10*3/uL (ref 0.1–1.0)
Monocytes Relative: 11.2 % (ref 3.0–12.0)
Neutro Abs: 4.4 10*3/uL (ref 1.4–7.7)
Neutrophils Relative %: 63.3 % (ref 43.0–77.0)
Platelets: 222 10*3/uL (ref 150.0–400.0)
RBC: 4.2 Mil/uL (ref 3.87–5.11)
RDW: 14.1 % (ref 11.5–15.5)
WBC: 6.9 10*3/uL (ref 4.0–10.5)

## 2017-01-25 LAB — SEDIMENTATION RATE: Sed Rate: 15 mm/hr (ref 0–30)

## 2017-01-25 LAB — BASIC METABOLIC PANEL
BUN: 18 mg/dL (ref 6–23)
CO2: 33 mEq/L — ABNORMAL HIGH (ref 19–32)
Calcium: 10.4 mg/dL (ref 8.4–10.5)
Chloride: 101 mEq/L (ref 96–112)
Creatinine, Ser: 0.78 mg/dL (ref 0.40–1.20)
GFR: 74.98 mL/min (ref >60.00)
Glucose, Bld: 102 mg/dL — ABNORMAL HIGH (ref 70–99)
Potassium: 4.5 mEq/L (ref 3.5–5.1)
Sodium: 140 mEq/L (ref 135–145)

## 2017-01-25 LAB — MICROALBUMIN / CREATININE URINE RATIO
Creatinine,U: 119.4 mg/dL
Microalb Creat Ratio: 3.1 mg/g (ref 0.0–30.0)
Microalb, Ur: 3.7 mg/dL — ABNORMAL HIGH (ref 0.0–1.9)

## 2017-01-25 LAB — HEMOGLOBIN A1C: Hgb A1c MFr Bld: 6.1 % (ref 4.6–6.5)

## 2017-01-25 LAB — VITAMIN D 25 HYDROXY (VIT D DEFICIENCY, FRACTURES): VITD: 24.78 ng/mL — ABNORMAL LOW (ref 30.00–100.00)

## 2017-01-25 NOTE — Patient Instructions (Addendum)
Let's check the mammogram.  you will receive a phone call, about a day and time for an appointment. blood tests are requested for you today.  We'll let you know about the results. Please come back for a follow-up appointment in 6 months.   Please consider these measures for your health:  minimize alcohol.  Do not use tobacco products.  Have a colonoscopy at least every 10 years from age 83.  Keep firearms safely stored.  Always use seat belts.  have working smoke alarms in your home.  See an eye doctor and dentist regularly.  Never drive under the influence of alcohol or drugs (including prescription drugs).  Those with fair skin should take precautions against the sun, and should carefully examine their skin once per month, for any new or changed moles. please let me know what your wishes would be, if artificial life support measures should become necessary.  It is critically important to prevent falling down (keep floor areas well-lit, dry, and free of loose objects.  If you have a cane, walker, or wheelchair, you should use it, even for short trips around the house.  Wear flat-soled shoes.  Also, try not to rush).   good diet and exercise significantly improve your health.  please let me know if you wish to be referred to a dietician.  high blood sugar is very risky to your health.  you should see an eye doctor and dentist every year.  It is very important to get all recommended vaccinations.  Please let me know if you want to see a specialist for your right leg pain.

## 2017-01-25 NOTE — Progress Notes (Signed)
Subjective:    Patient ID: Charlotte Harris, female    DOB: [birth year], 83 y.o.   MRN: [id number]  HPI Pt is here for regular wellness examination, and is feeling pretty well in general, and says chronic med probs are stable, except as noted below Past Medical History:  Diagnosis Date  . ADENOCARCINOMA, LUNG 07/03/2010   RUL  . AODM 06/21/2007  . ASYMPTOMATIC POSTMENOPAUSAL STATUS 07/02/2009  . Breast cancer ([place]) 1991   R mastectomy, no chemo or radiation  . BREAST CANCER, HX OF 04/16/2007  . Cancer ([place])   . COLONIC POLYPS, HX OF 04/16/2007  . COPD 05/17/2008  . HEMATURIA, HX OF 04/16/2007  . History of radiation therapy 07/30/09 to 08/09/09   RUL lung  . HYPERLIPIDEMIA 04/16/2007  . Hypertension   . HYPERTENSION 04/16/2007  . HYPOTHYROIDISM, POST-RADIATION 06/07/2008  . Osteoarthritis   . OSTEOPOROSIS 07/02/2009  . PERIPHERAL EDEMA 02/15/2009  . Wrist fracture, left     Past Surgical History:  Procedure Laterality Date  . BREAST SURGERY    . EYE SURGERY    . 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 LUNG BIOPSY  05/22/2009   RUL lung=Adenocarcinoma  . MASTECTOMY  1991   right  . TONSILLECTOMY  1955    Social History   Social History  . Marital status: Widowed    Spouse name: N/A  . Number of children: N/A  . Years of education: N/A   Occupational History  . Retired (worked Lexicographer) Retired   Social History Main Topics  . Smoking status: Former Smoker    Packs/day: 1.00    Years: 50.00    Types: Cigarettes    Quit date: 08/03/2009  . Smokeless tobacco: Never Used  . Alcohol use 1.2 oz/week    2 Glasses of wine per week  . Drug use: No  . Sexual activity: Not on file   Other Topics Concern  . Not on file   Social History Narrative   Widowed 2005   Quit smoking 2010   No other exposures, no TB hx    Current Outpatient Prescriptions on File Prior to Visit  Medication Sig Dispense Refill  . acetaminophen (TYLENOL)  500 MG tablet Take 500 mg by mouth every 6 (six) hours as needed. For pain    . alendronate (FOSAMAX) 70 MG tablet Take 1 tablet (70 mg total) by mouth every 7 (seven) days. Take with a full glass of water on an empty stomach. 4 tablet 11  . aspirin 81 MG tablet Take 81 mg by mouth at bedtime.     Marland Kitchen atenolol (TENORMIN) 25 MG tablet TAKE 1 TABLET BY MOUTH TWICE A DAY 180 tablet 0  . levothyroxine (SYNTHROID, LEVOTHROID) 50 MCG tablet TAKE 1 TABLET BY MOUTH EVERY DAY 90 tablet 1  . losartan (COZAAR) 100 MG tablet TAKE 1 TABLET (100 MG TOTAL) BY MOUTH DAILY. 90 tablet 3  . metFORMIN (GLUCOPHAGE-XR) 500 MG 24 hr tablet TAKE 1 TABLET BY MOUTH EVERY DAY 90 tablet 3  . NIFEdipine (PROCARDIA-XL/ADALAT CC) 30 MG 24 hr tablet TAKE 1 TABLET BY MOUTH EVERY MORNING 90 tablet 0  . SPIRIVA HANDIHALER 18 MCG inhalation capsule PLACE 1 CAPSULE (18 MCG TOTAL) INTO INHALER AND INHALE DAILY. 30 capsule 11   No current facility-administered medications on file prior to visit.     No Known Allergies  Family History  Problem Relation Age of Onset  . Heart attack  Mother 55  . Heart disease Mother   . Cancer Sister        "throat" cancer  . Heart disease Other        CAD  . Hypertension Other   . Colon cancer Neg Hx   . Stomach cancer Neg Hx   . Heart disease Father   . Cancer Brother   . Heart disease Sister   . Cancer Brother     BP 112/60   Pulse 88   Ht '5\' 5"'$  (1.651 m)   Wt 104 lb (47.2 kg)   SpO2 95%   BMI 17.31 kg/m    Review of Systems  Constitutional: Negative for unexpected weight change.       She has lost a few lbs, due to her efforts  HENT: Negative for nosebleeds.   Eyes: Negative for photophobia.  Respiratory: Negative for shortness of breath.   Cardiovascular: Negative for chest pain.  Gastrointestinal: Negative for blood in stool.  Endocrine: Negative for cold intolerance.  Genitourinary: Negative for hematuria.  Musculoskeletal: Negative for back pain.  Skin: Negative for  rash.  Allergic/Immunologic: Negative for environmental allergies.  Neurological: Negative for numbness.  Hematological: Bruises/bleeds easily.  Psychiatric/Behavioral: Negative for sleep disturbance.       Objective:   Physical Exam VS: see vs page GEN: no distress HEAD: head: no deformity eyes: no periorbital swelling, no proptosis external nose and ears are normal mouth: no lesion seen NECK: supple, thyroid is not enlarged CHEST WALL: no deformity LUNGS: clear to auscultation CV: reg rate and rhythm, no murmur.  bilat vv's.  ABD: abdomen is soft, nontender.  no hepatosplenomegaly.  not distended.  no hernia MUSCULOSKELETAL: muscle bulk and strength are grossly normal.  no obvious joint swelling.  gait is normal and steady EXTEMITIES: no deformity.  no ulcer on the feet.  feet are of normal color and temp.  Trace bilat leg edema.  There is bilateral onychomycosis of the toenails.  PULSES: dorsalis pedis intact bilat.  no carotid bruit NEURO:  cn 2-12 grossly intact.   readily moves all 4's.  sensation is intact to touch on the feet SKIN:  Normal texture and temperature.  No rash or suspicious lesion is visible.   NODES:  None palpable at the neck PSYCH: alert, well-oriented.  Does not appear anxious nor depressed.   I personally reviewed electrocardiogram tracing (today): Indication: DM Impression: NSR.  No MI.  No hypertrophy. Compared to 2017: no significant change.       Assessment & Plan:  Wellness visit today, with problems stable, except as noted.  Subjective:   Patient here for Medicare annual wellness visit and management of other chronic and acute problems.     Risk factors: advanced age    74 of Physicians Providing Medical Care to Patient:  See "snapshot"   Activities of Daily Living: In your present state of health, do you have any difficulty performing the following activities (lives alone)?:  Preparing food and eating?: No  Bathing yourself: No    Getting dressed: No  Using the toilet:No  Moving around from place to place: No  In the past year have you fallen or had a near fall?: No    Home Safety: Has smoke detector and wears seat belts. firearms are safely stored. No excess sun exposure.    Diet and Exercise  Current exercise habits: pt says good.   Dietary issues discussed: pt reports a relatively healthy diet   Depression Screen  Q1: Over the past two weeks, have you felt down, depressed or hopeless? no  Q2: Over the past two weeks, have you felt little interest or pleasure in doing things? no   The following portions of the patient's history were reviewed and updated as appropriate: allergies, current medications, past family history, past medical history, past social history, past surgical history and problem list.   Review of Systems  Denies hearing loss, and visual loss Objective:   Vision:  Advertising account executive, so she declines VA today.   Hearing: grossly normal Body mass index:  See vs page.  Msk: pt easily and quickly performs "get-up-and-go" from a sitting position Cognitive Impairment Assessment: cognition, memory and judgment appear normal.  remembers 3/3 at 5 minutes.  excellent recall.  can easily read and write a sentence.  alert and oriented x 3.    Assessment:   Medicare wellness utd on preventive parameters    Plan:   During the course of the visit the patient was educated and counseled about appropriate screening and preventive services including:        Fall prevention   Screening mammography is ordered today Bone densitometry screening is UTD Diabetes labs are done today.  Nutrition counseling is offered.   Vaccines are updated as needed.    Patient Instructions (the written plan) was given to the patient.     SEPARATE EVALUATION FOLLOWS--EACH PROBLEM HERE IS NEW, NOT RESPONDING TO TREATMENT, OR POSES SIGNIFICANT RISK TO THE PATIENT'S HEALTH: HISTORY OF THE PRESENT ILLNESS:  Pt states few  mos of intermitt moderate pain at the right hip, rad to the right knee.  No assoc numbness.   PAST MEDICAL HISTORY Past Medical History:  Diagnosis Date  . ADENOCARCINOMA, LUNG 07/03/2010   RUL  . AODM 06/21/2007  . ASYMPTOMATIC POSTMENOPAUSAL STATUS 07/02/2009  . Breast cancer ([place]) 1991   R mastectomy, no chemo or radiation  . BREAST CANCER, HX OF 04/16/2007  . Cancer ([place])   . COLONIC POLYPS, HX OF 04/16/2007  . COPD 05/17/2008  . HEMATURIA, HX OF 04/16/2007  . History of radiation therapy 07/30/09 to 08/09/09   RUL lung  . HYPERLIPIDEMIA 04/16/2007  . Hypertension   . HYPERTENSION 04/16/2007  . HYPOTHYROIDISM, POST-RADIATION 06/07/2008  . Osteoarthritis   . OSTEOPOROSIS 07/02/2009  . PERIPHERAL EDEMA 02/15/2009  . Wrist fracture, left     Past Surgical History:  Procedure Laterality Date  . BREAST SURGERY    . EYE SURGERY    . 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 LUNG BIOPSY  05/22/2009   RUL lung=Adenocarcinoma  . MASTECTOMY  1991   right  . TONSILLECTOMY  1955    Social History   Social History  . Marital status: Widowed    Spouse name: N/A  . Number of children: N/A  . Years of education: N/A   Occupational History  . Retired (worked Lexicographer) Retired   Social History Main Topics  . Smoking status: Former Smoker    Packs/day: 1.00    Years: 50.00    Types: Cigarettes    Quit date: 08/03/2009  . Smokeless tobacco: Never Used  . Alcohol use 1.2 oz/week    2 Glasses of wine per week  . Drug use: No  . Sexual activity: Not on file   Other Topics Concern  . Not on file   Social History Narrative   Widowed 2005   Quit smoking 2010   No  other exposures, no TB hx    Current Outpatient Prescriptions on File Prior to Visit  Medication Sig Dispense Refill  . acetaminophen (TYLENOL) 500 MG tablet Take 500 mg by mouth every 6 (six) hours as needed. For pain    . alendronate (FOSAMAX) 70 MG tablet Take 1 tablet (70 mg total)  by mouth every 7 (seven) days. Take with a full glass of water on an empty stomach. 4 tablet 11  . aspirin 81 MG tablet Take 81 mg by mouth at bedtime.     Marland Kitchen atenolol (TENORMIN) 25 MG tablet TAKE 1 TABLET BY MOUTH TWICE A DAY 180 tablet 0  . levothyroxine (SYNTHROID, LEVOTHROID) 50 MCG tablet TAKE 1 TABLET BY MOUTH EVERY DAY 90 tablet 1  . losartan (COZAAR) 100 MG tablet TAKE 1 TABLET (100 MG TOTAL) BY MOUTH DAILY. 90 tablet 3  . metFORMIN (GLUCOPHAGE-XR) 500 MG 24 hr tablet TAKE 1 TABLET BY MOUTH EVERY DAY 90 tablet 3  . NIFEdipine (PROCARDIA-XL/ADALAT CC) 30 MG 24 hr tablet TAKE 1 TABLET BY MOUTH EVERY MORNING 90 tablet 0  . SPIRIVA HANDIHALER 18 MCG inhalation capsule PLACE 1 CAPSULE (18 MCG TOTAL) INTO INHALER AND INHALE DAILY. 30 capsule 11   No current facility-administered medications on file prior to visit.     No Known Allergies  Family History  Problem Relation Age of Onset  . Heart attack Mother 49  . Heart disease Mother   . Cancer Sister        "throat" cancer  . Heart disease Other        CAD  . Hypertension Other   . Colon cancer Neg Hx   . Stomach cancer Neg Hx   . Heart disease Father   . Cancer Brother   . Heart disease Sister   . Cancer Brother     BP 112/60   Pulse 88   Ht '5\' 5"'$  (1.651 m)   Wt 104 lb (47.2 kg)   SpO2 95%   BMI 17.31 kg/m   REVIEW OF SYSTEMS: Denies fever PHYSICAL EXAMINATION: VITAL SIGNS:  See vs page GENERAL: no distress Right hip: nontender Gait: normal and steady.  LAB/XRAY RESULTS: ESR=normal IMPRESSION: Hip and thigh pain, new. She declines ref, x-ray, and rx for now PLAN:  Please let me know if you want to see a specialist for your right leg pain.

## 2017-01-26 LAB — PTH, INTACT AND CALCIUM
Calcium: 10.1 mg/dL (ref 8.6–10.4)
PTH: 63 pg/mL (ref 14–64)

## 2017-01-26 LAB — RHEUMATOID FACTOR: Rhuematoid fact SerPl-aCnc: <14 IU/mL (ref <14)

## 2017-02-04 ENCOUNTER — Other Ambulatory Visit: Payer: Self-pay | Admitting: Endocrinology

## 2017-03-18 ENCOUNTER — Other Ambulatory Visit: Payer: Self-pay | Admitting: Endocrinology

## 2017-04-04 ENCOUNTER — Other Ambulatory Visit: Payer: Self-pay | Admitting: Endocrinology

## 2017-05-19 ENCOUNTER — Other Ambulatory Visit: Payer: Self-pay | Admitting: Endocrinology

## 2017-06-16 ENCOUNTER — Other Ambulatory Visit: Payer: Self-pay | Admitting: Endocrinology

## 2017-06-28 IMAGING — CR DG CHEST 2V
2 series · 2 of 2 positions shown · non-contrast
Comparison: CT 09/02/2015.  Chest x-ray 05/30/2012.

CLINICAL DATA: Abnormal breath sounds. History breast cancer.
History of lung cancer.

EXAM:
CHEST  2 VIEW

[w chest pa]
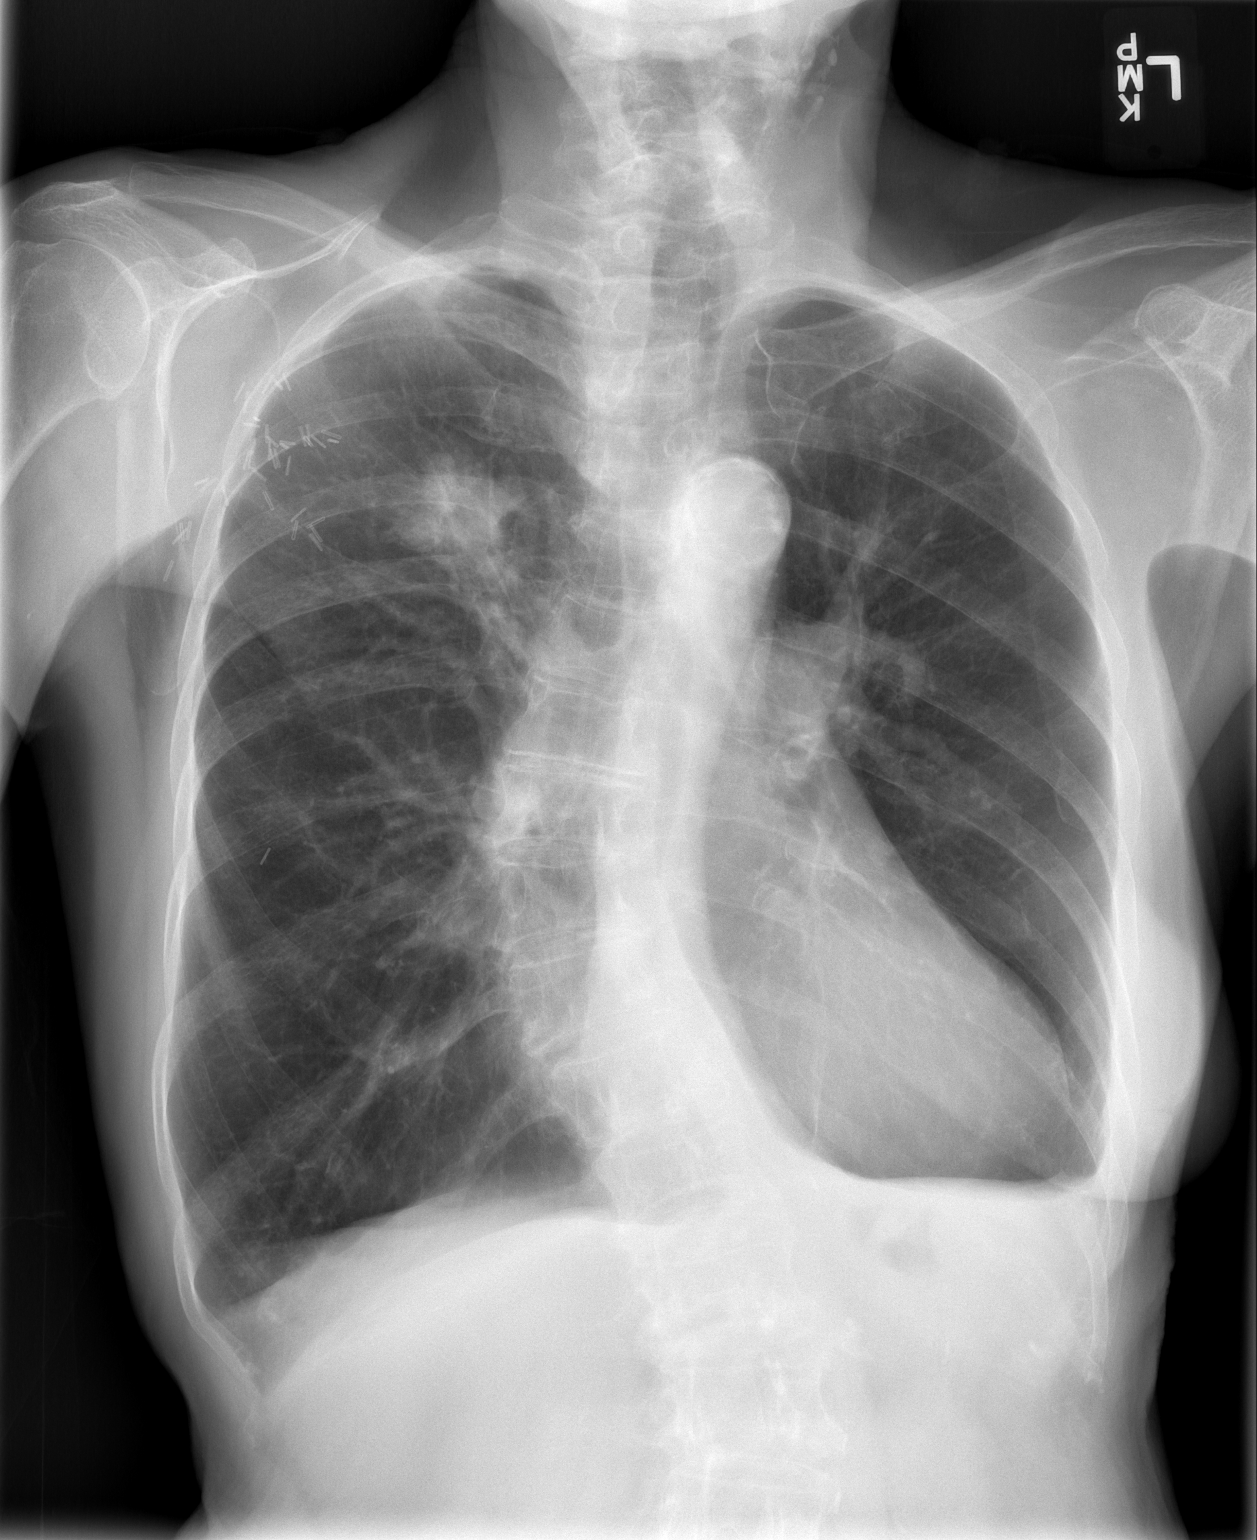

[w chest lat]
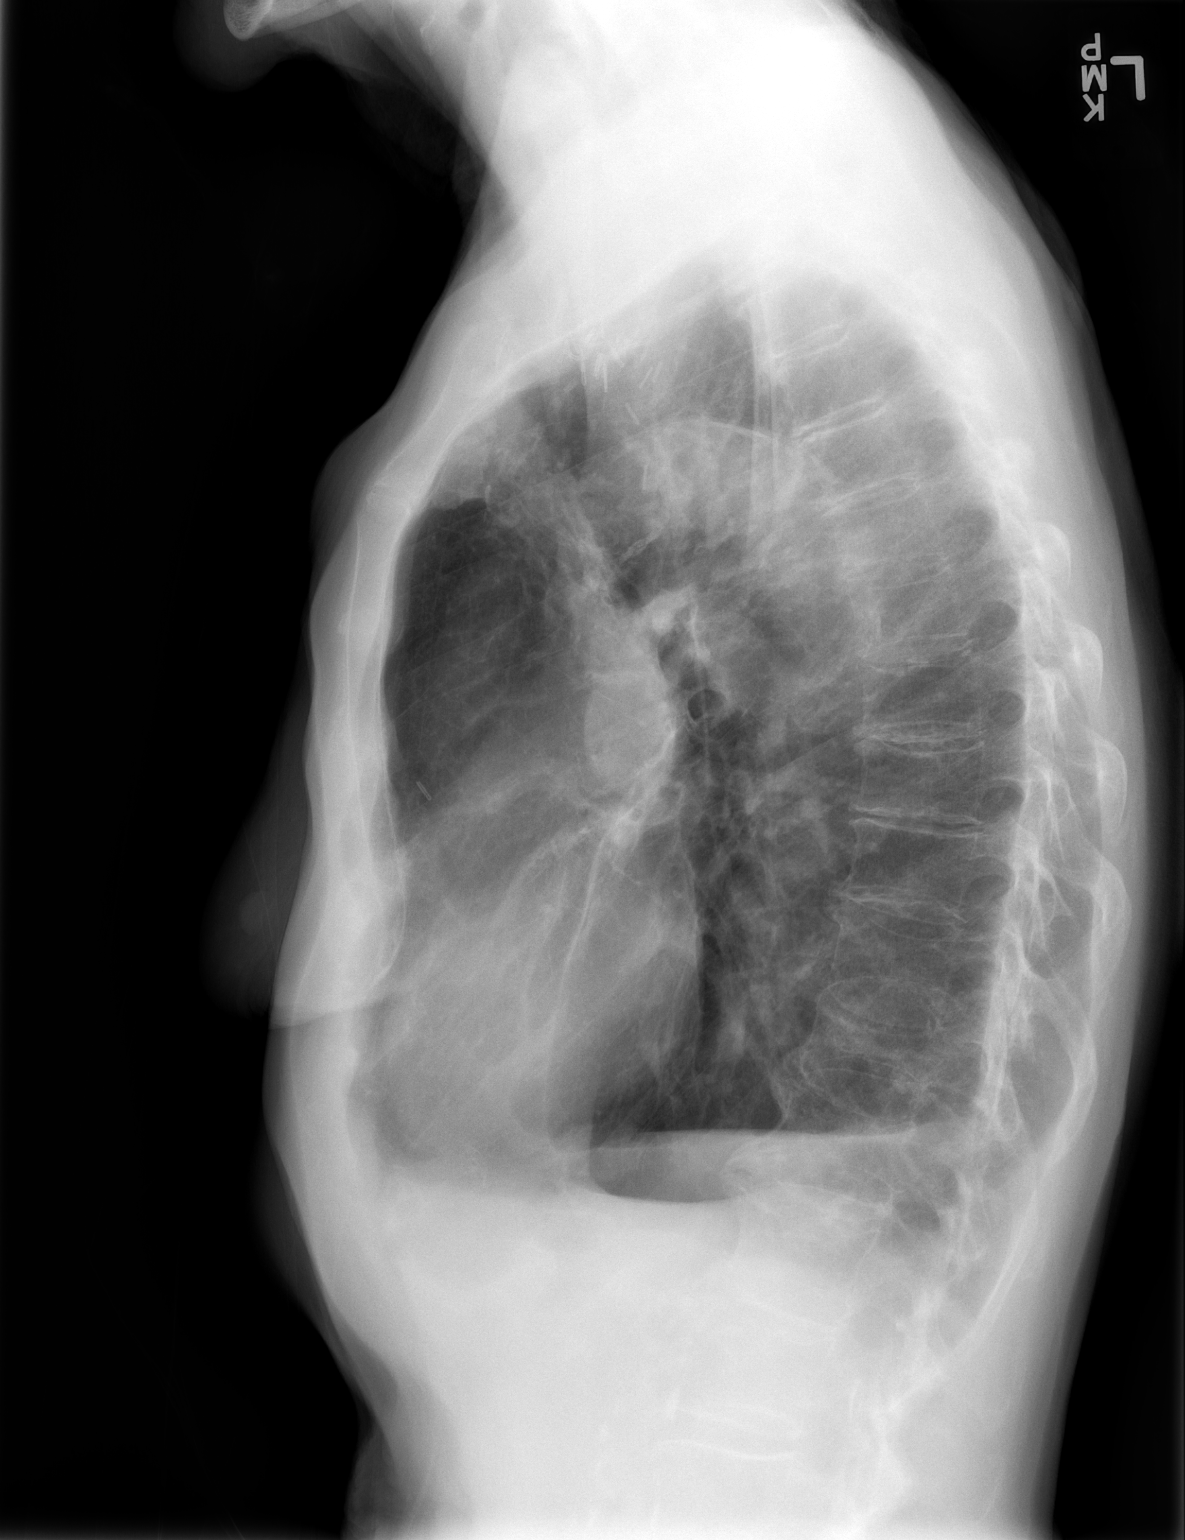

[2 of 2 positions shown; findings below may reference images not displayed]

FINDINGS: Mediastinum and hilar structures are stable. Stable right upper lobe
mass/radiation change. No acute pulmonary abnormality identified.
Stable basal pleural thickening. Heart size stable. Surgical clips
right axilla. Right mastectomy. Thoracolumbar spine scoliosis .
Carotid vascular calcification noted.
IMPRESSION: 1. Stable chest with stable right upper lobe mass/radiation change.
Right mastectomy. No acute abnormality.

2. Carotid vascular disease.

## 2017-07-07 ENCOUNTER — Other Ambulatory Visit: Payer: Self-pay | Admitting: Endocrinology

## 2017-07-07 ENCOUNTER — Other Ambulatory Visit: Payer: Self-pay

## 2017-07-07 MED ORDER — LEVOTHYROXINE SODIUM 50 MCG PO TABS: 50.0000 ug | ORAL_TABLET | Freq: Every day | ORAL | 1 refills | Status: DC

## 2017-07-16 ENCOUNTER — Other Ambulatory Visit: Payer: Self-pay | Admitting: Endocrinology

## 2017-07-21 ENCOUNTER — Ambulatory Visit: Payer: Medicare Other | Admitting: Endocrinology

## 2017-07-21 ENCOUNTER — Encounter: Payer: Self-pay | Admitting: Endocrinology

## 2017-07-21 VITALS — BP 148/78 | HR 89 | Wt 99.8 lb

## 2017-07-21 DIAGNOSIS — C3490 Malignant neoplasm of unspecified part of unspecified bronchus or lung: Secondary | ICD-10-CM | POA: Diagnosis not present

## 2017-07-21 DIAGNOSIS — E119 Type 2 diabetes mellitus without complications: Secondary | ICD-10-CM | POA: Diagnosis not present

## 2017-07-21 DIAGNOSIS — R634 Abnormal weight loss: Secondary | ICD-10-CM | POA: Insufficient documentation

## 2017-07-21 DIAGNOSIS — E559 Vitamin D deficiency, unspecified: Secondary | ICD-10-CM

## 2017-07-21 LAB — CBC WITH DIFFERENTIAL/PLATELET
Basophils Absolute: 0 10*3/uL (ref 0.0–0.1)
Basophils Relative: 0.5 % (ref 0.0–3.0)
Eosinophils Absolute: 0.1 10*3/uL (ref 0.0–0.7)
Eosinophils Relative: 1.1 % (ref 0.0–5.0)
HCT: 42.4 % (ref 36.0–46.0)
Hemoglobin: 13.9 g/dL (ref 12.0–15.0)
Lymphocytes Relative: 17.9 % (ref 12.0–46.0)
Lymphs Abs: 1.1 10*3/uL (ref 0.7–4.0)
MCHC: 32.7 g/dL (ref 30.0–36.0)
MCV: 97.5 fl (ref 78.0–100.0)
Monocytes Absolute: 0.6 10*3/uL (ref 0.1–1.0)
Monocytes Relative: 9.8 % (ref 3.0–12.0)
Neutro Abs: 4.2 10*3/uL (ref 1.4–7.7)
Neutrophils Relative %: 70.7 % (ref 43.0–77.0)
Platelets: 210 10*3/uL (ref 150.0–400.0)
RBC: 4.35 Mil/uL (ref 3.87–5.11)
RDW: 14.5 % (ref 11.5–15.5)
WBC: 6 10*3/uL (ref 4.0–10.5)

## 2017-07-21 LAB — BASIC METABOLIC PANEL
BUN: 18 mg/dL (ref 6–23)
CO2: 36 mEq/L — ABNORMAL HIGH (ref 19–32)
Calcium: 9.8 mg/dL (ref 8.4–10.5)
Chloride: 100 mEq/L (ref 96–112)
Creatinine, Ser: 0.66 mg/dL (ref 0.40–1.20)
GFR: 90.81 mL/min (ref >60.00)
Glucose, Bld: 102 mg/dL — ABNORMAL HIGH (ref 70–99)
Potassium: 4.3 mEq/L (ref 3.5–5.1)
Sodium: 141 mEq/L (ref 135–145)

## 2017-07-21 LAB — TSH: TSH: 1.18 u[IU]/mL (ref 0.35–4.50)

## 2017-07-21 LAB — POCT GLYCOSYLATED HEMOGLOBIN (HGB A1C): Hemoglobin A1C: 6

## 2017-07-21 LAB — VITAMIN D 25 HYDROXY (VIT D DEFICIENCY, FRACTURES): VITD: 34.84 ng/mL (ref 30.00–100.00)

## 2017-07-21 NOTE — Progress Notes (Signed)
Subjective:    Patient ID: Charlotte Harris, female    DOB: Jan 07, 1932, 84 y.o.   MRN: [id number]  HPI The state of at least three ongoing medical problems is addressed today, with interval history of each noted here: Pt returns for f/u of diabetes mellitus: DM type: 2 Dx'ed: 1324 Complications: nephropathy Therapy: metformin.  GDM: never.  DKA: never.  Severe hypoglycemia: never.   Pancreatitis: never.  Other: she has never taken insulin.   Interval history: she has lost a few more lbs.   She stopped fosamax (took x just a few months).  She does not wish to resume Past Medical History:  Diagnosis Date  . ADENOCARCINOMA, LUNG 07/03/2010   RUL  . AODM 06/21/2007  . ASYMPTOMATIC POSTMENOPAUSAL STATUS 07/02/2009  . Breast cancer ([place]) 1991   R mastectomy, no chemo or radiation  . BREAST CANCER, HX OF 04/16/2007  . Cancer ([place])   . COLONIC POLYPS, HX OF 04/16/2007  . COPD 05/17/2008  . HEMATURIA, HX OF 04/16/2007  . History of radiation therapy 07/30/09 to 08/09/09   RUL lung  . HYPERLIPIDEMIA 04/16/2007  . Hypertension   . HYPERTENSION 04/16/2007  . HYPOTHYROIDISM, POST-RADIATION 06/07/2008  . Osteoarthritis   . OSTEOPOROSIS 07/02/2009  . PERIPHERAL EDEMA 02/15/2009  . Wrist fracture, left     Past Surgical History:  Procedure Laterality Date  . BREAST SURGERY    . EYE SURGERY    . 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 LUNG BIOPSY  05/22/2009   RUL lung=Adenocarcinoma  . MASTECTOMY  1991   right  . TONSILLECTOMY  1955    Social History   Socioeconomic History  . Marital status: Widowed    Spouse name: Not on file  . Number of children: Not on file  . Years of education: Not on file  . Highest education level: Not on file  Social Needs  . Financial resource strain: Not on file  . Food insecurity - worry: Not on file  . Food insecurity - inability: Not on file  . Transportation needs - medical: Not on file  . Transportation needs -  non-medical: Not on file  Occupational History  . Occupation: Retired (worked Lexicographer)    Employer: RETIRED  Tobacco Use  . Smoking status: Former Smoker    Packs/day: 1.00    Years: 50.00    Pack years: 50.00    Types: Cigarettes    Last attempt to quit: 08/03/2009    Years since quitting: 7.9  . Smokeless tobacco: Never Used  Substance and Sexual Activity  . Alcohol use: Yes    Alcohol/week: 1.2 oz    Types: 2 Glasses of wine per week  . Drug use: No  . Sexual activity: Not on file  Other Topics Concern  . Not on file  Social History Narrative   Widowed 2005   Quit smoking 2010   No other exposures, no TB hx    Current Outpatient Medications on File Prior to Visit  Medication Sig Dispense Refill  . acetaminophen (TYLENOL) 500 MG tablet Take 500 mg by mouth every 6 (six) hours as needed. For pain    . aspirin 81 MG tablet Take 81 mg by mouth at bedtime.     Marland Kitchen atenolol (TENORMIN) 25 MG tablet TAKE 1 TABLET BY MOUTH TWICE A DAY 180 tablet 0  . levothyroxine (SYNTHROID, LEVOTHROID) 50 MCG tablet Take 1 tablet (50 mcg total) by mouth  daily. 90 tablet 1  . losartan (COZAAR) 100 MG tablet TAKE 1 TABLET (100 MG TOTAL) BY MOUTH DAILY. 90 tablet 3  . NIFEdipine (PROCARDIA-XL/ADALAT CC) 30 MG 24 hr tablet TAKE 1 TABLET BY MOUTH EVERY MORNING 90 tablet 0  . SPIRIVA HANDIHALER 18 MCG inhalation capsule INHALE 1 CAPSULE VIA HANDIHALER ONCE DAILY AT THE SAME TIME EVERY DAY 30 capsule 10   No current facility-administered medications on file prior to visit.     No Known Allergies  Family History  Problem Relation Age of Onset  . Heart attack Mother 69  . Heart disease Mother   . Cancer Sister        "throat" cancer  . Heart disease Other        CAD  . Hypertension Other   . Colon cancer Neg Hx   . Stomach cancer Neg Hx   . Heart disease Father   . Cancer Brother   . Heart disease Sister   . Cancer Brother     BP (!) 148/78 (BP Location: Left Arm, Patient Position:  Sitting, Cuff Size: Normal)   Pulse 89   Wt 99 lb 12.8 oz (45.3 kg)   SpO2 92%   BMI 16.61 kg/m   Review of Systems Denies cough and sob.      Objective:   Physical Exam VITAL SIGNS:  See vs page GENERAL: no distress LUNGS:  Clear to auscultation.   Pulses: foot pulses are intact bilaterally.   MSK: no deformity of the feet or ankles.  CV: trace bilat edema of the legs, and bilat vv's Skin:  no ulcer on the feet or ankles, except for bilat bunions.  normal color and temp on the feet and ankles.  Neuro: sensation is intact to touch on the feet and ankles.   Ext: There is bilateral onychomycosis of the toenails.   A1c=6.0%    Assessment & Plan:  Type 2 DM: well-controlled.  Osteoporosis: she declines rx.  Weight loss: check labs.   Patient Instructions  Please continue the same medications.  Let's recheck the chest x-ray.  you will receive a phone call, about a day and time for an appointment.  blood tests are requested for you today.  We'll let you know about the results.  Please come back for a regular physical appointment in 6 months (must be after 01/25/18).   We'll plan to recheck the bone density test next year.

## 2017-07-21 NOTE — Patient Instructions (Addendum)
Please continue the same medications.  Let's recheck the chest x-ray.  you will receive a phone call, about a day and time for an appointment.  blood tests are requested for you today.  We'll let you know about the results.  Please come back for a regular physical appointment in 6 months (must be after 01/25/18).   We'll plan to recheck the bone density test next year.

## 2017-07-22 ENCOUNTER — Other Ambulatory Visit: Payer: Self-pay

## 2017-07-22 MED ORDER — METFORMIN HCL ER 500 MG PO TB24: 500.0000 mg | ORAL_TABLET | Freq: Every day | ORAL | 3 refills | Status: DC

## 2017-08-16 ENCOUNTER — Other Ambulatory Visit: Payer: Self-pay

## 2017-08-16 MED ORDER — NIFEDIPINE ER 30 MG PO TB24: 30.0000 mg | ORAL_TABLET | Freq: Every morning | ORAL | 1 refills | Status: DC

## 2017-09-13 ENCOUNTER — Other Ambulatory Visit: Payer: Self-pay

## 2017-09-13 ENCOUNTER — Other Ambulatory Visit: Payer: Self-pay | Admitting: Endocrinology

## 2017-09-13 MED ORDER — ATENOLOL 25 MG PO TABS: 25.0000 mg | ORAL_TABLET | Freq: Two times a day (BID) | ORAL | 0 refills | Status: DC

## 2017-09-23 DIAGNOSIS — E119 Type 2 diabetes mellitus without complications: Secondary | ICD-10-CM | POA: Diagnosis not present

## 2017-09-23 LAB — HM DIABETES EYE EXAM

## 2017-12-13 ENCOUNTER — Other Ambulatory Visit: Payer: Self-pay | Admitting: Endocrinology

## 2017-12-24 ENCOUNTER — Other Ambulatory Visit: Payer: Self-pay | Admitting: Internal Medicine

## 2018-01-09 ENCOUNTER — Other Ambulatory Visit: Payer: Self-pay | Admitting: Endocrinology

## 2018-01-18 ENCOUNTER — Other Ambulatory Visit: Payer: Self-pay | Admitting: Internal Medicine

## 2018-01-18 NOTE — Telephone Encounter (Signed)
OK 

## 2018-01-18 NOTE — Telephone Encounter (Signed)
Is this okay to refill? 

## 2018-01-31 ENCOUNTER — Ambulatory Visit: Payer: Medicare Other | Admitting: Endocrinology

## 2018-01-31 ENCOUNTER — Other Ambulatory Visit: Payer: Self-pay | Admitting: Endocrinology

## 2018-01-31 ENCOUNTER — Encounter: Payer: Self-pay | Admitting: Endocrinology

## 2018-01-31 VITALS — BP 98/58 | HR 71 | Wt 96.4 lb

## 2018-01-31 DIAGNOSIS — E119 Type 2 diabetes mellitus without complications: Secondary | ICD-10-CM | POA: Diagnosis not present

## 2018-01-31 DIAGNOSIS — M810 Age-related osteoporosis without current pathological fracture: Secondary | ICD-10-CM | POA: Diagnosis not present

## 2018-01-31 DIAGNOSIS — I10 Essential (primary) hypertension: Secondary | ICD-10-CM

## 2018-01-31 DIAGNOSIS — E890 Postprocedural hypothyroidism: Secondary | ICD-10-CM

## 2018-01-31 DIAGNOSIS — E559 Vitamin D deficiency, unspecified: Secondary | ICD-10-CM

## 2018-01-31 DIAGNOSIS — Z0001 Encounter for general adult medical examination with abnormal findings: Secondary | ICD-10-CM

## 2018-01-31 LAB — CBC WITH DIFFERENTIAL/PLATELET
Basophils Absolute: 0 10*3/uL (ref 0.0–0.1)
Basophils Relative: 0.7 % (ref 0.0–3.0)
Eosinophils Absolute: 0.1 10*3/uL (ref 0.0–0.7)
Eosinophils Relative: 1.6 % (ref 0.0–5.0)
HCT: 41.5 % (ref 36.0–46.0)
Hemoglobin: 13.7 g/dL (ref 12.0–15.0)
Lymphocytes Relative: 24 % (ref 12.0–46.0)
Lymphs Abs: 1.2 10*3/uL (ref 0.7–4.0)
MCHC: 33 g/dL (ref 30.0–36.0)
MCV: 96.1 fl (ref 78.0–100.0)
Monocytes Absolute: 0.6 10*3/uL (ref 0.1–1.0)
Monocytes Relative: 11.8 % (ref 3.0–12.0)
Neutro Abs: 3 10*3/uL (ref 1.4–7.7)
Neutrophils Relative %: 61.9 % (ref 43.0–77.0)
Platelets: 177 10*3/uL (ref 150.0–400.0)
RBC: 4.32 Mil/uL (ref 3.87–5.11)
RDW: 14.3 % (ref 11.5–15.5)
WBC: 4.8 10*3/uL (ref 4.0–10.5)

## 2018-01-31 LAB — URINALYSIS, ROUTINE W REFLEX MICROSCOPIC
Bilirubin Urine: NEGATIVE
Hgb urine dipstick: NEGATIVE
Ketones, ur: NEGATIVE
Leukocytes, UA: NEGATIVE
Nitrite: NEGATIVE
RBC / HPF: NONE SEEN (ref 0–?)
Specific Gravity, Urine: 1.01 (ref 1.000–1.030)
Urine Glucose: NEGATIVE
Urobilinogen, UA: 0.2 (ref 0.0–1.0)
pH: 7 (ref 5.0–8.0)

## 2018-01-31 LAB — BASIC METABOLIC PANEL
BUN: 16 mg/dL (ref 6–23)
CO2: 37 mEq/L — ABNORMAL HIGH (ref 19–32)
Calcium: 9.8 mg/dL (ref 8.4–10.5)
Chloride: 100 mEq/L (ref 96–112)
Creatinine, Ser: 0.73 mg/dL (ref 0.40–1.20)
GFR: 80.73 mL/min (ref >60.00)
Glucose, Bld: 75 mg/dL (ref 70–99)
Potassium: 4.3 mEq/L (ref 3.5–5.1)
Sodium: 141 mEq/L (ref 135–145)

## 2018-01-31 LAB — POCT GLYCOSYLATED HEMOGLOBIN (HGB A1C): Hemoglobin A1C: 6 % — AB (ref 4.0–5.6)

## 2018-01-31 LAB — HEPATIC FUNCTION PANEL
ALT: 8 U/L (ref 0–35)
AST: 13 U/L (ref 0–37)
Albumin: 4.5 g/dL (ref 3.5–5.2)
Alkaline Phosphatase: 42 U/L (ref 39–117)
Bilirubin, Direct: 0.1 mg/dL (ref 0.0–0.3)
Total Bilirubin: 0.5 mg/dL (ref 0.2–1.2)
Total Protein: 7 g/dL (ref 6.0–8.3)

## 2018-01-31 LAB — MICROALBUMIN / CREATININE URINE RATIO
Creatinine,U: 116.8 mg/dL
Microalb Creat Ratio: 3.6 mg/g (ref 0.0–30.0)
Microalb, Ur: 4.2 mg/dL — ABNORMAL HIGH (ref 0.0–1.9)

## 2018-01-31 LAB — LIPID PANEL
Cholesterol: 218 mg/dL — ABNORMAL HIGH (ref 0–200)
HDL: 70.8 mg/dL (ref >39.00)
LDL Cholesterol: 119 mg/dL — ABNORMAL HIGH (ref 0–99)
NonHDL: 146.9
Total CHOL/HDL Ratio: 3
Triglycerides: 138 mg/dL (ref 0.0–149.0)
VLDL: 27.6 mg/dL (ref 0.0–40.0)

## 2018-01-31 LAB — VITAMIN D 25 HYDROXY (VIT D DEFICIENCY, FRACTURES): VITD: 43.83 ng/mL (ref 30.00–100.00)

## 2018-01-31 LAB — TSH: TSH: 1.12 u[IU]/mL (ref 0.35–4.50)

## 2018-01-31 MED ORDER — METOPROLOL SUCCINATE ER 25 MG PO TB24
25.0000 mg | ORAL_TABLET | Freq: Every day | ORAL | 3 refills | Status: DC
Start: 2018-01-31 — End: 2019-05-23

## 2018-01-31 NOTE — Telephone Encounter (Signed)
It was changed to metoprolol this am.

## 2018-01-31 NOTE — Progress Notes (Signed)
Subjective:    Patient ID: Charlotte Harris, female    DOB: 05-05-1932, 84 y.o.   MRN: [id number]  HPI Pt is here for regular wellness examination, and is feeling pretty well in general, and says chronic med probs are stable, except as noted below' Past Medical History:  Diagnosis Date  . ADENOCARCINOMA, LUNG 07/03/2010   RUL  . AODM 06/21/2007  . ASYMPTOMATIC POSTMENOPAUSAL STATUS 07/02/2009  . Breast cancer ([place]) 1991   R mastectomy, no chemo or radiation  . BREAST CANCER, HX OF 04/16/2007  . Cancer ([place])   . COLONIC POLYPS, HX OF 04/16/2007  . COPD 05/17/2008  . HEMATURIA, HX OF 04/16/2007  . History of radiation therapy 07/30/09 to 08/09/09   RUL lung  . HYPERLIPIDEMIA 04/16/2007  . Hypertension   . HYPERTENSION 04/16/2007  . HYPOTHYROIDISM, POST-RADIATION 06/07/2008  . Osteoarthritis   . OSTEOPOROSIS 07/02/2009  . PERIPHERAL EDEMA 02/15/2009  . Wrist fracture, left     Past Surgical History:  Procedure Laterality Date  . BREAST SURGERY    . EYE SURGERY    . 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 LUNG BIOPSY  05/22/2009   RUL lung=Adenocarcinoma  . MASTECTOMY  1991   right  . TONSILLECTOMY  1955    Social History   Socioeconomic History  . Marital status: Widowed    Spouse name: Not on file  . Number of children: Not on file  . Years of education: Not on file  . Highest education level: Not on file  Occupational History  . Occupation: Retired (worked Lexicographer)    Employer: RETIRED  Social Needs  . Financial resource strain: Not on file  . Food insecurity:    Worry: Not on file    Inability: Not on file  . Transportation needs:    Medical: Not on file    Non-medical: Not on file  Tobacco Use  . Smoking status: Former Smoker    Packs/day: 1.00    Years: 50.00    Pack years: 50.00    Types: Cigarettes    Last attempt to quit: 08/03/2009    Years since quitting: 8.5  . Smokeless tobacco: Never Used  Substance and  Sexual Activity  . Alcohol use: Yes    Alcohol/week: 1.2 oz    Types: 2 Glasses of wine per week  . Drug use: No  . Sexual activity: Not on file  Lifestyle  . Physical activity:    Days per week: Not on file    Minutes per session: Not on file  . Stress: Not on file  Relationships  . Social connections:    Talks on phone: Not on file    Gets together: Not on file    Attends religious service: Not on file    Active member of club or organization: Not on file    Attends meetings of clubs or organizations: Not on file    Relationship status: Not on file  . Intimate partner violence:    Fear of current or ex partner: Not on file    Emotionally abused: Not on file    Physically abused: Not on file    Forced sexual activity: Not on file  Other Topics Concern  . Not on file  Social History Narrative   Widowed 2005   Quit smoking 2010   No other exposures, no TB hx    Current Outpatient Medications on File Prior to Visit  Medication Sig Dispense Refill  . acetaminophen (TYLENOL) 500 MG tablet Take 500 mg by mouth every 6 (six) hours as needed. For pain    . aspirin 81 MG tablet Take 81 mg by mouth at bedtime.     Marland Kitchen levothyroxine (SYNTHROID, LEVOTHROID) 50 MCG tablet Take 1 tablet (50 mcg total) by mouth daily. 90 tablet 1  . losartan (COZAAR) 100 MG tablet TAKE 1 TABLET (100 MG TOTAL) BY MOUTH DAILY. 90 tablet 3  . metFORMIN (GLUCOPHAGE-XR) 500 MG 24 hr tablet Take 1 tablet (500 mg total) by mouth daily. 270 tablet 3  . NIFEdipine (PROCARDIA-XL/ADALAT-CC/NIFEDICAL-XL) 30 MG 24 hr tablet TAKE 1 TABLET (30 MG TOTAL) BY MOUTH EVERY MORNING. 30 tablet 2  . SPIRIVA HANDIHALER 18 MCG inhalation capsule INHALE 1 CAPSULE VIA HANDIHALER ONCE DAILY AT THE SAME TIME EVERY DAY 90 capsule 3   No current facility-administered medications on file prior to visit.     No Known Allergies  Family History  Problem Relation Age of Onset  . Heart attack Mother 29  . Heart disease Mother   .  Cancer Sister        "throat" cancer  . Heart disease Other        CAD  . Hypertension Other   . Colon cancer Neg Hx   . Stomach cancer Neg Hx   . Heart disease Father   . Cancer Brother   . Heart disease Sister   . Cancer Brother     BP (!) 98/58 (BP Location: Left Arm, Patient Position: Sitting, Cuff Size: Normal)   Pulse 71   Wt 96 lb 6.4 oz (43.7 kg)   SpO2 93%   BMI 16.04 kg/m     Review of Systems Denies fever, fatigue, visual loss, hearing loss, chest pain, back pain, depression, cold intolerance, BRBPR, hematuria, syncope, numbness, allergy sxs, and rash.  No change in chronic easy bruising     Objective:   Physical Exam VS: see vs page GEN: no distress HEAD: head: no deformity eyes: no periorbital swelling, no proptosis external nose and ears are normal mouth: no lesion seen NECK: supple, thyroid is not enlarged CHEST WALL: no deformity LUNGS:  Clear to auscultation CV: reg rate and rhythm, no murmur ABD: abdomen is soft, nontender.  no hepatosplenomegaly.  not distended.  no hernia.   MUSCULOSKELETAL: muscle bulk and strength are grossly normal.  no obvious joint swelling.  gait is normal and steady EXTEMITIES: no deformity, except for overlapping toes.  no ulcer on the feet.  feet are of normal color and temp.  There is bilateral onychomycosis of the toenails.   PULSES: dorsalis pedis intact bilat.  no carotid bruit NEURO:  cn 2-12 grossly intact.   readily moves all 4's.  sensation is intact to touch on the feet SKIN:  Normal texture and temperature.  No rash or suspicious lesion is visible.  Few ecchymoses are the forearms. NODES:  None palpable at the neck PSYCH: alert, well-oriented.  Does not appear anxious nor depressed.  I personally reviewed electrocardiogram tracing (today): Indication: HTN Impression: NSR.  No MI.  No hypertrophy. Compared to 2018: no significant change     Assessment & Plan:    Subjective:   Patient here for Medicare  annual wellness visit and management of other chronic and acute problems.     Risk factors: advanced age    13 of Physicians Providing Medical Care to Patient:  See "snapshot"   Activities of Daily Living:  In your present state of health, do you have any difficulty performing the following activities (lives alone)?:  Preparing food and eating?: No  Bathing yourself: No  Getting dressed: No  Using the toilet:No  Moving around from place to place: No  In the past year have you fallen or had a near fall?:No    Home Safety: Has smoke detector and wears seat belts. Firearms are safely stored. No excess sun exposure.    Opioid Use: none   Diet and Exercise  Current exercise habits: pt says good Dietary issues discussed: pt reports a healthy diet   Depression Screen  Q1: Over the past two weeks, have you felt down, depressed or hopeless? no  Q2: Over the past two weeks, have you felt little interest or pleasure in doing things? no   The following portions of the patient's history were reviewed and updated as appropriate: allergies, current medications, past family history, past medical history, past social history, past surgical history and problem list.   Review of Systems  Denies hearing loss, and visual loss Objective:   Vision:  See [facility] done today Hearing: grossly normal Body mass index:  See vs page Msk: pt easily and quickly performs "get-up-and-go" from a sitting position Cognitive Impairment Assessment: cognition, memory and judgment appear normal.  remembers 2/3 at 5 minutes (? effort).  excellent recall.  can easily read and write a sentence.  alert and oriented x 3   Assessment:   Medicare wellness utd on preventive parameters.    Plan:   During the course of the visit the patient was educated and counseled about appropriate screening and preventive services including:        Fall prevention is advised today  Screening mammography is declined Bone densitometry  screening is ordered Diabetes screening is done today Nutrition counseling is offered  advanced directives/end of life addressed today:  see healthcare directives hyperlink  Vaccines are updated as needed  Patient Instructions (the written plan) was given to the patient.    SEPARATE EVALUATION FOLLOWS--EACH PROBLEM HERE IS NEW, NOT RESPONDING TO TREATMENT, OR POSES SIGNIFICANT RISK TO THE PATIENT'S HEALTH: HISTORY OF THE PRESENT ILLNESS: Pt takes BP meds as rx'ed.  She tolerates well PAST MEDICAL HISTORY Past Medical History:  Diagnosis Date  . ADENOCARCINOMA, LUNG 07/03/2010   RUL  . AODM 06/21/2007  . ASYMPTOMATIC POSTMENOPAUSAL STATUS 07/02/2009  . Breast cancer ([place]) 1991   R mastectomy, no chemo or radiation  . BREAST CANCER, HX OF 04/16/2007  . Cancer ([place])   . COLONIC POLYPS, HX OF 04/16/2007  . COPD 05/17/2008  . HEMATURIA, HX OF 04/16/2007  . History of radiation therapy 07/30/09 to 08/09/09   RUL lung  . HYPERLIPIDEMIA 04/16/2007  . Hypertension   . HYPERTENSION 04/16/2007  . HYPOTHYROIDISM, POST-RADIATION 06/07/2008  . Osteoarthritis   . OSTEOPOROSIS 07/02/2009  . PERIPHERAL EDEMA 02/15/2009  . Wrist fracture, left     Past Surgical History:  Procedure Laterality Date  . BREAST SURGERY    . EYE SURGERY    . 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 LUNG BIOPSY  05/22/2009   RUL lung=Adenocarcinoma  . MASTECTOMY  1991   right  . TONSILLECTOMY  1955    Social History   Socioeconomic History  . Marital status: Widowed    Spouse name: Not on file  . Number of children: Not on file  . Years of education: Not on file  .  Highest education level: Not on file  Occupational History  . Occupation: Retired (worked Lexicographer)    Employer: RETIRED  Social Needs  . Financial resource strain: Not on file  . Food insecurity:    Worry: Not on file    Inability: Not on file  . Transportation needs:    Medical: Not on file     Non-medical: Not on file  Tobacco Use  . Smoking status: Former Smoker    Packs/day: 1.00    Years: 50.00    Pack years: 50.00    Types: Cigarettes    Last attempt to quit: 08/03/2009    Years since quitting: 8.5  . Smokeless tobacco: Never Used  Substance and Sexual Activity  . Alcohol use: Yes    Alcohol/week: 1.2 oz    Types: 2 Glasses of wine per week  . Drug use: No  . Sexual activity: Not on file  Lifestyle  . Physical activity:    Days per week: Not on file    Minutes per session: Not on file  . Stress: Not on file  Relationships  . Social connections:    Talks on phone: Not on file    Gets together: Not on file    Attends religious service: Not on file    Active member of club or organization: Not on file    Attends meetings of clubs or organizations: Not on file    Relationship status: Not on file  . Intimate partner violence:    Fear of current or ex partner: Not on file    Emotionally abused: Not on file    Physically abused: Not on file    Forced sexual activity: Not on file  Other Topics Concern  . Not on file  Social History Narrative   Widowed 2005   Quit smoking 2010   No other exposures, no TB hx    Current Outpatient Medications on File Prior to Visit  Medication Sig Dispense Refill  . acetaminophen (TYLENOL) 500 MG tablet Take 500 mg by mouth every 6 (six) hours as needed. For pain    . aspirin 81 MG tablet Take 81 mg by mouth at bedtime.     Marland Kitchen levothyroxine (SYNTHROID, LEVOTHROID) 50 MCG tablet Take 1 tablet (50 mcg total) by mouth daily. 90 tablet 1  . losartan (COZAAR) 100 MG tablet TAKE 1 TABLET (100 MG TOTAL) BY MOUTH DAILY. 90 tablet 3  . metFORMIN (GLUCOPHAGE-XR) 500 MG 24 hr tablet Take 1 tablet (500 mg total) by mouth daily. 270 tablet 3  . NIFEdipine (PROCARDIA-XL/ADALAT-CC/NIFEDICAL-XL) 30 MG 24 hr tablet TAKE 1 TABLET (30 MG TOTAL) BY MOUTH EVERY MORNING. 30 tablet 2  . SPIRIVA HANDIHALER 18 MCG inhalation capsule INHALE 1 CAPSULE VIA  HANDIHALER ONCE DAILY AT THE SAME TIME EVERY DAY 90 capsule 3   No current facility-administered medications on file prior to visit.     No Known Allergies  Family History  Problem Relation Age of Onset  . Heart attack Mother 27  . Heart disease Mother   . Cancer Sister        "throat" cancer  . Heart disease Other        CAD  . Hypertension Other   . Colon cancer Neg Hx   . Stomach cancer Neg Hx   . Heart disease Father   . Cancer Brother   . Heart disease Sister   . Cancer Brother     BP (!) 98/58 (BP Location: Left  Arm, Patient Position: Sitting, Cuff Size: Normal)   Pulse 71   Wt 96 lb 6.4 oz (43.7 kg)   SpO2 93%   BMI 16.04 kg/m   REVIEW OF SYSTEMS: No change in chronic doe PHYSICAL EXAMINATION: VITAL SIGNS:  See vs page GENERAL: no distress CV: Trace bilat leg edema, and bilat vv's. LAB/XRAY RESULTS: Lab Results  Component Value Date   CREATININE 0.73 01/31/2018   BUN 16 01/31/2018   NA 141 01/31/2018   K 4.3 01/31/2018   CL 100 01/31/2018   CO2 37 (H) 01/31/2018   IMPRESSION: HTN: overcontrolled PLAN:  change the atenolol to metoprolol

## 2018-01-31 NOTE — Progress Notes (Signed)
we discussed code status.  pt requests full code, but would not want to be started or maintained on artificial life-support measures if there was not a reasonable chance of recovery 

## 2018-01-31 NOTE — Patient Instructions (Addendum)
good diet and exercise significantly improve the control of your diabetes.  However, because your weight is low, you don't need to be on a special diet.  please let me know if you wish to be referred to a dietician.  high blood sugar is very risky to your health.  you should see an eye doctor and dentist every year.  It is very important to get all recommended vaccinations.  Please consider these measures for your health:  minimize alcohol.  Do not use tobacco products.  Have a colonoscopy at least every 10 years from age 80.  Women should have an annual mammogram from age 32.  Keep firearms safely stored.  Always use seat belts.  have working smoke alarms in your home.  See an eye doctor and dentist regularly.  Never drive under the influence of alcohol or drugs (including prescription drugs).  Those with fair skin should take precautions against the sun, and should carefully examine their skin once per month, for any new or changed moles. please let me know what your wishes would be, if artificial life support measures should become necessary.  It is critically important to prevent falling down (keep floor areas well-lit, dry, and free of loose objects.  If you have a cane, walker, or wheelchair, you should use it, even for short trips around the house.  Wear flat-soled shoes.  Also, try not to rush) blood tests are requested for you today.  We'll let you know about the results. Please change the atenolol to metoprolol.  I have sent a prescription to your pharmacy.  Please come back for a follow-up appointment in 6 months

## 2018-02-02 LAB — PTH, INTACT AND CALCIUM
Calcium: 9.8 mg/dL (ref 8.6–10.4)
PTH: 74 pg/mL — ABNORMAL HIGH (ref 14–64)

## 2018-02-17 ENCOUNTER — Inpatient Hospital Stay: Admission: RE | Admit: 2018-02-17 | Payer: Medicare Other | Source: Ambulatory Visit

## 2018-02-18 ENCOUNTER — Ambulatory Visit (INDEPENDENT_AMBULATORY_CARE_PROVIDER_SITE_OTHER)
Admission: RE | Admit: 2018-02-18 | Discharge: 2018-02-18 | Disposition: A | Discharge status: Home / Self Care | Payer: Medicare Other | Source: Ambulatory Visit | Attending: Endocrinology | Admitting: Endocrinology

## 2018-02-18 DIAGNOSIS — M810 Age-related osteoporosis without current pathological fracture: Secondary | ICD-10-CM | POA: Diagnosis not present

## 2018-04-15 ENCOUNTER — Other Ambulatory Visit: Payer: Self-pay | Admitting: Internal Medicine

## 2018-05-18 ENCOUNTER — Other Ambulatory Visit: Payer: Self-pay | Admitting: Endocrinology

## 2018-06-15 ENCOUNTER — Ambulatory Visit: Payer: Medicare Other | Admitting: Family Medicine

## 2018-06-17 ENCOUNTER — Encounter: Payer: Self-pay | Admitting: Family Medicine

## 2018-06-17 ENCOUNTER — Ambulatory Visit (INDEPENDENT_AMBULATORY_CARE_PROVIDER_SITE_OTHER): Payer: Medicare Other | Admitting: Family Medicine

## 2018-06-17 VITALS — BP 142/64 | HR 91 | Temp 98.5°F | Wt 99.1 lb

## 2018-06-17 DIAGNOSIS — J449 Chronic obstructive pulmonary disease, unspecified: Secondary | ICD-10-CM | POA: Diagnosis not present

## 2018-06-17 DIAGNOSIS — E119 Type 2 diabetes mellitus without complications: Secondary | ICD-10-CM | POA: Diagnosis not present

## 2018-06-17 DIAGNOSIS — I10 Essential (primary) hypertension: Secondary | ICD-10-CM | POA: Diagnosis not present

## 2018-06-17 DIAGNOSIS — E890 Postprocedural hypothyroidism: Secondary | ICD-10-CM

## 2018-06-17 DIAGNOSIS — E1169 Type 2 diabetes mellitus with other specified complication: Secondary | ICD-10-CM

## 2018-06-17 DIAGNOSIS — E785 Hyperlipidemia, unspecified: Secondary | ICD-10-CM

## 2018-06-17 MED ORDER — ALBUTEROL SULFATE 108 (90 BASE) MCG/ACT IN AEPB: 1.0000 | INHALATION_SPRAY | Freq: Four times a day (QID) | RESPIRATORY_TRACT | 5 refills | Status: DC | PRN

## 2018-06-17 MED ORDER — E-Z SPACER DEVI: 2 refills | Status: DC

## 2018-06-17 NOTE — Progress Notes (Signed)
Charlotte Harris DOB: 1931-09-20 Encounter date: 06/17/2018  This isa 84 y.o. female who presents to establish care. Chief Complaint  Patient presents with  . New Patient (Initial Visit)    History of present illness: Nothing that is bothering her today except concern for weight loss. Gradual. Lives by self and knows she doesn't eat like she should. Added in protein drink recently. Doesn't get as hungry as she used to. Husband passed in 2005.   Lung cancer with radiation RUL in past. Since that time has felt like she has lost weight. Has been released from radiation onc with Dr. Lisbeth Renshaw.   Saw Dr. Fuller Plan until she was 59 and told her she didn't have to follow up.   HTN: does check at home and gets range from 118-120's/80. Hasn't had cuff checked against office cuff.   COPD: breathing overall is pretty good. Does feel like she has to recover a little more if she is over-exerted. Carrying in groceries will trigger her. She is able to vacuum house without difficulty. No chest pain or pressure. Uses spiriva daily.  Has been on metformin for a long time. Sugar has been well controlled. Previously was on statin and not sure why this was stopped but thinks it was due to having good cholesterol.  Had treatment to thyroid years ago. Does ok with synthroid.    Past Medical History:  Diagnosis Date  . ADENOCARCINOMA, LUNG 07/03/2010   RUL  . AODM 06/21/2007  . ASYMPTOMATIC POSTMENOPAUSAL STATUS 07/02/2009  . Breast cancer ([place]) 1991   R mastectomy, no chemo or radiation  . BREAST CANCER, HX OF 04/16/2007  . Cancer ([place])   . COLONIC POLYPS, HX OF 04/16/2007  . COPD 05/17/2008  . HEMATURIA, HX OF 04/16/2007  . History of radiation therapy 07/30/09 to 08/09/09   RUL lung  . HYPERLIPIDEMIA 04/16/2007  . Hypertension   . HYPERTENSION 04/16/2007  . HYPOTHYROIDISM, POST-RADIATION 06/07/2008  . Osteoarthritis   . OSTEOPOROSIS 07/02/2009  . PERIPHERAL EDEMA 02/15/2009  . Wrist fracture, left     Past Surgical History:  Procedure Laterality Date  . BREAST SURGERY     lumpectomy  . EYE SURGERY Bilateral    cataracts  . 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 LUNG BIOPSY  05/22/2009   RUL lung=Adenocarcinoma  . MASTECTOMY  1991   right  . TONSILLECTOMY  1955   No Known Allergies Current Meds  Medication Sig  . acetaminophen (TYLENOL) 500 MG tablet Take 500 mg by mouth every 6 (six) hours as needed. For pain  . aspirin 81 MG tablet Take 81 mg by mouth at bedtime.   Marland Kitchen levothyroxine (SYNTHROID, LEVOTHROID) 50 MCG tablet TAKE 1 TABLET BY MOUTH EVERY DAY  . losartan (COZAAR) 100 MG tablet TAKE 1 TABLET (100 MG TOTAL) BY MOUTH DAILY.  . metFORMIN (GLUCOPHAGE-XR) 500 MG 24 hr tablet Take 1 tablet (500 mg total) by mouth daily.  . metoprolol succinate (TOPROL-XL) 25 MG 24 hr tablet Take 1 tablet (25 mg total) by mouth daily.  Marland Kitchen NIFEdipine (PROCARDIA-XL/ADALAT-CC/NIFEDICAL-XL) 30 MG 24 hr tablet TAKE 1 TABLET (30 MG TOTAL) BY MOUTH EVERY MORNING.  . SPIRIVA HANDIHALER 18 MCG inhalation capsule INHALE 1 CAPSULE VIA HANDIHALER ONCE DAILY AT THE SAME TIME EVERY DAY   Social History   Tobacco Use  . Smoking status: Former Smoker    Packs/day: 1.00    Years: 50.00    Pack years: 50.00  Types: Cigarettes    Last attempt to quit: 08/03/2009    Years since quitting: 8.8  . Smokeless tobacco: Never Used  Substance Use Topics  . Alcohol use: Yes    Alcohol/week: 2.0 standard drinks    Types: 2 Glasses of wine per week   Family History  Problem Relation Age of Onset  . Heart attack Mother 7  . Heart disease Mother   . Cancer Sister        "throat" cancer  . Heart disease Father   . Cancer Brother        blood  . Heart disease Sister   . Cancer Brother        blood  . Heart disease Other        CAD  . Hypertension Other   . Colon cancer Neg Hx   . Stomach cancer Neg Hx      Review of Systems  Constitutional: Negative for chills, fatigue  and fever.  Respiratory: Negative for cough, chest tightness, shortness of breath and wheezing.   Cardiovascular: Negative for chest pain, palpitations and leg swelling.  Endocrine:       See hpi     Objective:  BP (!) 142/64 (BP Location: Left Arm, Patient Position: Sitting, Cuff Size: Normal)   Pulse 91   Temp 98.5 F (36.9 C) (Oral)   Wt 99 lb 1.6 oz (45 kg)   SpO2 90%   BMI 16.49 kg/m   Weight: 99 lb 1.6 oz (45 kg)   BP Readings from Last 3 Encounters:  06/17/18 (!) 142/64  01/31/18 (!) 98/58  07/21/17 (!) 148/78   Wt Readings from Last 3 Encounters:  06/17/18 99 lb 1.6 oz (45 kg)  01/31/18 96 lb 6.4 oz (43.7 kg)  07/21/17 99 lb 12.8 oz (45.3 kg)    Physical Exam  Constitutional: She is oriented to person, place, and time. She appears well-developed and well-nourished. No distress.  thin  Cardiovascular: Normal rate, regular rhythm and normal heart sounds. Exam reveals no friction rub.  No murmur heard. No lower extremity edema  Pulmonary/Chest: Effort normal and breath sounds normal. No respiratory distress. She has no wheezes. She has no rales.  Neurological: She is alert and oriented to person, place, and time.  Psychiatric: Her behavior is normal. Cognition and memory are normal.    Assessment/Plan:  1. Hypertension, unspecified type Early well-controlled.  Continue current medications.  Continue checking blood pressures at home and recommend that she bring cuff with her to our next visit.  2. Chronic obstructive pulmonary disease, unspecified COPD type ([place]) Generally well controlled.  We will trial albuterol inhaler.  I encouraged her to try this prior to activities where she has more respiratory exertion, like carrying in groceries. - Albuterol Sulfate 108 (90 Base) MCG/ACT AEPB; Inhale 1 puff into the lungs 4 (four) times daily as needed.  Dispense: 1 each; Refill: 5 - Spacer/Aero-Holding Chambers (E-Z SPACER) inhaler; Use as instructed  Dispense: 1 each;  Refill: 2  3. Hypothyroidism following radioiodine therapy Well-controlled on Synthroid.  Followed by cardiology.  4. Type 2 diabetes mellitus without complication, without long-term current use of insulin ([place]) Well-controlled.  Continue metformin.  Plan to recheck blood work at next visit.  5. Hyperlipidemia associated with type 2 diabetes mellitus ([place]) We discussed pros and cons of statin treatment.  At this point, with her current cholesterol numbers not certain that it is worthwhile for her to be on a statin due to risk of  side effects.  We will continue to discuss and monitor.    Return in about 4 months (around 10/16/2018) for physical exam.  Micheline Rough, MD

## 2018-07-09 ENCOUNTER — Other Ambulatory Visit: Payer: Self-pay | Admitting: Endocrinology

## 2018-07-09 NOTE — Telephone Encounter (Signed)
Please refill x 3 months Further refills would have to be considered by new PCP   

## 2018-07-10 ENCOUNTER — Other Ambulatory Visit: Payer: Self-pay | Admitting: Endocrinology

## 2018-07-10 NOTE — Telephone Encounter (Signed)
Please refill x 3 months Further refills would have to be considered by new PCP   

## 2018-07-18 ENCOUNTER — Inpatient Hospital Stay (HOSPITAL_COMMUNITY)
Admission: EM | Admit: 2018-07-18 | Discharge: 2018-07-21 | DRG: 189 | Disposition: A | Discharge status: Home Health | Payer: Medicare Other | Attending: Internal Medicine | Admitting: Internal Medicine

## 2018-07-18 ENCOUNTER — Emergency Department (HOSPITAL_COMMUNITY): Payer: Medicare Other

## 2018-07-18 ENCOUNTER — Encounter (HOSPITAL_COMMUNITY): Payer: Self-pay

## 2018-07-18 DIAGNOSIS — I10 Essential (primary) hypertension: Secondary | ICD-10-CM | POA: Diagnosis not present

## 2018-07-18 DIAGNOSIS — J449 Chronic obstructive pulmonary disease, unspecified: Secondary | ICD-10-CM | POA: Diagnosis present

## 2018-07-18 DIAGNOSIS — Z87891 Personal history of nicotine dependence: Secondary | ICD-10-CM | POA: Diagnosis not present

## 2018-07-18 DIAGNOSIS — E1169 Type 2 diabetes mellitus with other specified complication: Secondary | ICD-10-CM | POA: Diagnosis present

## 2018-07-18 DIAGNOSIS — Z7984 Long term (current) use of oral hypoglycemic drugs: Secondary | ICD-10-CM | POA: Diagnosis not present

## 2018-07-18 DIAGNOSIS — E890 Postprocedural hypothyroidism: Secondary | ICD-10-CM | POA: Diagnosis present

## 2018-07-18 DIAGNOSIS — I5030 Unspecified diastolic (congestive) heart failure: Secondary | ICD-10-CM | POA: Diagnosis not present

## 2018-07-18 DIAGNOSIS — I119 Hypertensive heart disease without heart failure: Secondary | ICD-10-CM | POA: Diagnosis present

## 2018-07-18 DIAGNOSIS — E785 Hyperlipidemia, unspecified: Secondary | ICD-10-CM | POA: Diagnosis present

## 2018-07-18 DIAGNOSIS — Z8249 Family history of ischemic heart disease and other diseases of the circulatory system: Secondary | ICD-10-CM

## 2018-07-18 DIAGNOSIS — Z8719 Personal history of other diseases of the digestive system: Secondary | ICD-10-CM

## 2018-07-18 DIAGNOSIS — Z79890 Hormone replacement therapy (postmenopausal): Secondary | ICD-10-CM

## 2018-07-18 DIAGNOSIS — J9621 Acute and chronic respiratory failure with hypoxia: Principal | ICD-10-CM | POA: Diagnosis present

## 2018-07-18 DIAGNOSIS — Z9011 Acquired absence of right breast and nipple: Secondary | ICD-10-CM | POA: Diagnosis not present

## 2018-07-18 DIAGNOSIS — R918 Other nonspecific abnormal finding of lung field: Secondary | ICD-10-CM | POA: Diagnosis not present

## 2018-07-18 DIAGNOSIS — D649 Anemia, unspecified: Secondary | ICD-10-CM | POA: Diagnosis present

## 2018-07-18 DIAGNOSIS — J9601 Acute respiratory failure with hypoxia: Secondary | ICD-10-CM | POA: Diagnosis not present

## 2018-07-18 DIAGNOSIS — M1990 Unspecified osteoarthritis, unspecified site: Secondary | ICD-10-CM | POA: Diagnosis present

## 2018-07-18 DIAGNOSIS — Z923 Personal history of irradiation: Secondary | ICD-10-CM

## 2018-07-18 DIAGNOSIS — M8448XA Pathological fracture, other site, initial encounter for fracture: Secondary | ICD-10-CM | POA: Diagnosis present

## 2018-07-18 DIAGNOSIS — Z808 Family history of malignant neoplasm of other organs or systems: Secondary | ICD-10-CM

## 2018-07-18 DIAGNOSIS — Z85118 Personal history of other malignant neoplasm of bronchus and lung: Secondary | ICD-10-CM | POA: Diagnosis not present

## 2018-07-18 DIAGNOSIS — D638 Anemia in other chronic diseases classified elsewhere: Secondary | ICD-10-CM | POA: Diagnosis present

## 2018-07-18 DIAGNOSIS — Z7982 Long term (current) use of aspirin: Secondary | ICD-10-CM

## 2018-07-18 DIAGNOSIS — R079 Chest pain, unspecified: Secondary | ICD-10-CM | POA: Diagnosis present

## 2018-07-18 DIAGNOSIS — R0902 Hypoxemia: Secondary | ICD-10-CM

## 2018-07-18 DIAGNOSIS — Z853 Personal history of malignant neoplasm of breast: Secondary | ICD-10-CM

## 2018-07-18 DIAGNOSIS — R0602 Shortness of breath: Secondary | ICD-10-CM

## 2018-07-18 DIAGNOSIS — J439 Emphysema, unspecified: Secondary | ICD-10-CM | POA: Diagnosis not present

## 2018-07-18 DIAGNOSIS — Z9849 Cataract extraction status, unspecified eye: Secondary | ICD-10-CM | POA: Diagnosis not present

## 2018-07-18 DIAGNOSIS — E782 Mixed hyperlipidemia: Secondary | ICD-10-CM | POA: Diagnosis present

## 2018-07-18 DIAGNOSIS — E119 Type 2 diabetes mellitus without complications: Secondary | ICD-10-CM | POA: Diagnosis not present

## 2018-07-18 DIAGNOSIS — E118 Type 2 diabetes mellitus with unspecified complications: Secondary | ICD-10-CM

## 2018-07-18 DIAGNOSIS — M8080XA Other osteoporosis with current pathological fracture, unspecified site, initial encounter for fracture: Secondary | ICD-10-CM | POA: Diagnosis present

## 2018-07-18 LAB — I-STAT CHEM 8, ED
BUN: 21 mg/dL (ref 8–23)
Calcium, Ion: 1.26 mmol/L (ref 1.15–1.40)
Chloride: 97 mmol/L — ABNORMAL LOW (ref 98–111)
Creatinine, Ser: 0.7 mg/dL (ref 0.44–1.00)
Glucose, Bld: 90 mg/dL (ref 70–99)
HCT: 38 % (ref 36.0–46.0)
Hemoglobin: 12.9 g/dL (ref 12.0–15.0)
Potassium: 4 mmol/L (ref 3.5–5.1)
Sodium: 139 mmol/L (ref 135–145)
TCO2: 37 mmol/L — ABNORMAL HIGH (ref 22–32)

## 2018-07-18 LAB — BASIC METABOLIC PANEL
Anion gap: 8 (ref 5–15)
BUN: 18 mg/dL (ref 8–23)
CO2: 35 mmol/L — ABNORMAL HIGH (ref 22–32)
Calcium: 9.5 mg/dL (ref 8.9–10.3)
Chloride: 98 mmol/L (ref 98–111)
Creatinine, Ser: 0.71 mg/dL (ref 0.44–1.00)
GFR calc Af Amer: >60 mL/min (ref >60)
GFR calc non Af Amer: >60 mL/min (ref >60)
Glucose, Bld: 97 mg/dL (ref 70–99)
Potassium: 4.1 mmol/L (ref 3.5–5.1)
Sodium: 141 mmol/L (ref 135–145)

## 2018-07-18 LAB — I-STAT TROPONIN, ED
Troponin i, poc: 0.02 ng/mL (ref 0.00–0.08)
Troponin i, poc: 0.03 ng/mL (ref 0.00–0.08)
Troponin i, poc: 0.02 ng/mL (ref 0.00–0.08)

## 2018-07-18 LAB — CBC WITH DIFFERENTIAL/PLATELET
Abs Immature Granulocytes: 0.02 10*3/uL (ref 0.00–0.07)
Basophils Absolute: 0 10*3/uL (ref 0.0–0.1)
Basophils Relative: 0 %
Eosinophils Absolute: 0 10*3/uL (ref 0.0–0.5)
Eosinophils Relative: 1 %
HCT: 38.9 % (ref 36.0–46.0)
Hemoglobin: 11.3 g/dL — ABNORMAL LOW (ref 12.0–15.0)
Immature Granulocytes: 0 %
Lymphocytes Relative: 11 %
Lymphs Abs: 0.8 10*3/uL (ref 0.7–4.0)
MCH: 29.7 pg (ref 26.0–34.0)
MCHC: 29 g/dL — ABNORMAL LOW (ref 30.0–36.0)
MCV: 102.4 fL — ABNORMAL HIGH (ref 80.0–100.0)
Monocytes Absolute: 1 10*3/uL (ref 0.1–1.0)
Monocytes Relative: 14 %
Neutro Abs: 5.3 10*3/uL (ref 1.7–7.7)
Neutrophils Relative %: 74 %
Platelets: 232 10*3/uL (ref 150–400)
RBC: 3.8 MIL/uL — ABNORMAL LOW (ref 3.87–5.11)
RDW: 13.3 % (ref 11.5–15.5)
WBC: 7.2 10*3/uL (ref 4.0–10.5)
nRBC: 0 % (ref 0.0–0.2)

## 2018-07-18 LAB — CBG MONITORING, ED: Glucose-Capillary: 94 mg/dL (ref 70–99)

## 2018-07-18 MED ORDER — IPRATROPIUM-ALBUTEROL 0.5-2.5 (3) MG/3ML IN SOLN
3.0000 mL | Freq: Once | RESPIRATORY_TRACT | Status: AC
  Administered 2018-07-18: 3 mL via RESPIRATORY_TRACT
  Filled 2018-07-18: qty 3

## 2018-07-18 MED ORDER — IOPAMIDOL (ISOVUE-370) INJECTION 76%
75.0000 mL | Freq: Once | INTRAVENOUS | Status: AC | PRN
  Administered 2018-07-18: 75 mL via INTRAVENOUS

## 2018-07-18 MED ORDER — IOPAMIDOL (ISOVUE-370) INJECTION 76%
INTRAVENOUS | Status: AC
  Filled 2018-07-18: qty 100

## 2018-07-18 NOTE — ED Provider Notes (Signed)
[facility] EMERGENCY DEPARTMENT Provider Note   CSN: [id number] Arrival date & time: 07/18/18  1516     History   Chief Complaint Chief Complaint  Patient presents with  . Chest Pain  . Shortness of Breath    HPI Charlotte Harris is a 84 y.o. female.  HPI Patient is an 84 year old female with history of breast cancer and prior lung cancer both in remission, COPD, hypertension, hyperlipidemia who presents to the emergency department today for evaluation of chest pain and shortness of breath.  Patient reports that yesterday morning she was at rest when she developed chest pain described as tingling in the left anterior chest.  Has had shortness of breath associated with this.  Applied Vicks VapoRub and symptoms improved.  Today she has had worsening shortness of breath including at rest.  No cough, fever, chills or congestion.  Chest pain continues to be tingling in the left anterior chest.  No other associated symptoms.  Reports otherwise feeling well.  Went to urgent care where she was found to be hypoxic.  Shortness of breath improved with oxygen.  Patient denies history of DVT or PE in the past.  Takes no anticoagulants.  Does use aspirin 81 mg daily. Compliant with home inhalers. Lives at home alone.   Past Medical History:  Diagnosis Date  . ADENOCARCINOMA, LUNG 07/03/2010   RUL  . AODM 06/21/2007  . ASYMPTOMATIC POSTMENOPAUSAL STATUS 07/02/2009  . Breast cancer ([place]) 1991   R mastectomy, no chemo or radiation  . BREAST CANCER, HX OF 04/16/2007  . Cancer ([place])   . COLONIC POLYPS, HX OF 04/16/2007  . COPD 05/17/2008  . HEMATURIA, HX OF 04/16/2007  . History of radiation therapy 07/30/09 to 08/09/09   RUL lung  . HYPERLIPIDEMIA 04/16/2007  . Hypertension   . HYPERTENSION 04/16/2007  . HYPOTHYROIDISM, POST-RADIATION 06/07/2008  . Osteoarthritis   . OSTEOPOROSIS 07/02/2009  . PERIPHERAL EDEMA 02/15/2009  . Wrist fracture, left     Patient Active Problem  List   Diagnosis Date Noted  . Acute respiratory failure with hypoxia ([place]) 07/18/2018  . Weight loss 07/21/2017  . Hip pain, right 01/25/2017  . Vitamin D deficiency 07/16/2016  . Side effects of treatment 11/15/2014  . Diabetes ([place]) 07/10/2012  . Malignant neoplasm of right upper lobe of lung ([place]) 03/05/2012  . Breast cancer ([place])   . History of radiation therapy   . Cancer ([place])   . Encounter for long-term (current) use of other medications 07/06/2011  . Malignant neoplasm of bronchus and lung ([place]) 07/03/2010  . TOBACCO USE, QUIT 07/03/2010  . Osteoporosis 07/02/2009  . ASYMPTOMATIC POSTMENOPAUSAL STATUS 07/02/2009  . PERIPHERAL EDEMA 02/15/2009  . Hypothyroidism following radioiodine therapy 06/07/2008  . COPD (chronic obstructive pulmonary disease) ([place]) 05/17/2008  . Hyperlipidemia associated with type 2 diabetes mellitus ([place]) 04/16/2007  . HTN (hypertension) 04/16/2007  . BREAST CANCER, HX OF 04/16/2007  . COLONIC POLYPS, HX OF 04/16/2007  . HEMATURIA, HX OF 04/16/2007    Past Surgical History:  Procedure Laterality Date  . BREAST SURGERY     lumpectomy  . EYE SURGERY Bilateral    cataracts  . 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 LUNG BIOPSY  05/22/2009   RUL lung=Adenocarcinoma  . MASTECTOMY  1991   right  . TONSILLECTOMY  1955     OB History   No obstetric history on file.      Home Medications  Prior to Admission medications   Medication Sig Start Date End Date Taking? Authorizing Provider  acetaminophen (TYLENOL) 500 MG tablet Take 500 mg by mouth every 6 (six) hours as needed for headache (pain).    Yes [provider]  Albuterol Sulfate 108 (90 Base) MCG/ACT AEPB Inhale 1 puff into the lungs 4 (four) times daily as needed. Patient taking differently: Inhale 1 puff into the lungs 4 (four) times daily as needed (shortness of breath/ wheezing).  06/17/18  Yes Caren Macadam, MD  aspirin EC 81 MG tablet  Take 81 mg by mouth at bedtime.   Yes [provider]  Calcium Carb-Cholecalciferol (CALCIUM 1000 + D PO) Take 2 tablets by mouth daily.   Yes [provider]  levothyroxine (SYNTHROID, LEVOTHROID) 50 MCG tablet TAKE 1 TABLET BY MOUTH EVERY DAY Patient taking differently: Take 50 mcg by mouth daily before breakfast.  05/19/18  Yes Renato Shin, MD  losartan (COZAAR) 100 MG tablet TAKE 1 TABLET (100 MG TOTAL) BY MOUTH DAILY. Patient taking differently: Take 100 mg by mouth daily.  07/11/18  Yes Renato Shin, MD  metFORMIN (GLUCOPHAGE-XR) 500 MG 24 hr tablet Take 1 tablet (500 mg total) by mouth daily. Patient taking differently: Take 500 mg by mouth daily after supper.  07/22/17  Yes Renato Shin, MD  metoprolol succinate (TOPROL-XL) 25 MG 24 hr tablet Take 1 tablet (25 mg total) by mouth daily. Patient taking differently: Take 25 mg by mouth daily after supper.  01/31/18  Yes Renato Shin, MD  NIFEdipine (PROCARDIA-XL/NIFEDICAL-XL) 30 MG 24 hr tablet TAKE 1 TABLET BY MOUTH EVERY MORNING Patient taking differently: Take 30 mg by mouth daily.  07/11/18  Yes Renato Shin, MD  SPIRIVA HANDIHALER 18 MCG inhalation capsule INHALE 1 CAPSULE VIA HANDIHALER ONCE DAILY AT [place] Patient taking differently: Place 18 mcg into inhaler and inhale daily.  01/09/18  Yes Renato Shin, MD  Spacer/Aero-Holding Chambers (E-Z SPACER) inhaler Use as instructed 06/17/18   Caren Macadam, MD    Family History Family History  Problem Relation Age of Onset  . Heart attack Mother 60  . Heart disease Mother   . Cancer Sister        "throat" cancer  . Heart disease Father   . Cancer Brother        blood  . Heart disease Sister   . Cancer Brother        blood  . Heart disease Other        CAD  . Hypertension Other   . Colon cancer Neg Hx   . Stomach cancer Neg Hx     Social History Social History   Tobacco Use  . Smoking status: Former Smoker    Packs/day: 1.00     Years: 50.00    Pack years: 50.00    Types: Cigarettes    Last attempt to quit: 08/03/2009    Years since quitting: 8.9  . Smokeless tobacco: Never Used  Substance Use Topics  . Alcohol use: Yes    Alcohol/week: 2.0 standard drinks    Types: 2 Glasses of wine per week  . Drug use: No     Allergies   Patient has no known allergies.   Review of Systems Review of Systems  Constitutional: Negative for chills and fever.  HENT: Negative for congestion and sore throat.   Eyes: Negative for visual disturbance.  Respiratory: Positive for shortness of breath. Negative for cough and chest tightness.  Cardiovascular: Positive for chest pain (tingling) and leg swelling (trace BLE ).  Gastrointestinal: Negative for abdominal pain, diarrhea, nausea and vomiting.  Genitourinary: Negative for dysuria.  Musculoskeletal: Negative for back pain and neck pain.  Skin: Negative for color change and rash.  Neurological: Negative for weakness and headaches.  All other systems reviewed and are negative.    Physical Exam Updated Vital Signs BP 102/83   Pulse 96   Temp 98.8 F (37.1 C) (Oral)   Resp (!) 26   Ht 5\' 5"  (1.651 m)   Wt 44.9 kg   SpO2 98%   BMI 16.47 kg/m   Physical Exam Vitals signs and nursing note reviewed.  Constitutional:      General: She is not in acute distress.    Appearance: She is well-developed. She is not ill-appearing or toxic-appearing.     Comments: Very thin  HENT:     Head: Normocephalic and atraumatic.  Eyes:     Conjunctiva/sclera: Conjunctivae normal.  Neck:     Musculoskeletal: Neck supple.     Vascular: No JVD.  Cardiovascular:     Rate and Rhythm: Regular rhythm. Tachycardia present.  Pulmonary:     Effort: Pulmonary effort is normal. No respiratory distress.     Breath sounds: Rales (fine bases bilaterally) present.     Comments: Speaks full sentences on 4L [place] Abdominal:     General: There is no distension.     Palpations: Abdomen is soft.       Tenderness: There is no abdominal tenderness. There is no guarding or rebound.  Skin:    General: Skin is warm and dry.     Capillary Refill: Capillary refill takes less than 2 seconds.  Neurological:     Mental Status: She is alert.      ED Treatments / Results  Labs (all labs ordered are listed, but only abnormal results are displayed) Labs Reviewed  CBC WITH DIFFERENTIAL/PLATELET - Abnormal; Notable for the following components:      Result Value   RBC 3.80 (*)    Hemoglobin 11.3 (*)    MCV 102.4 (*)    MCHC 29.0 (*)    All other components within normal limits  BASIC METABOLIC PANEL - Abnormal; Notable for the following components:   CO2 35 (*)    All other components within normal limits  I-STAT CHEM 8, ED - Abnormal; Notable for the following components:   Chloride 97 (*)    TCO2 37 (*)    All other components within normal limits  CBG MONITORING, ED  I-STAT TROPONIN, ED  I-STAT TROPONIN, ED  I-STAT TROPONIN, ED  I-STAT TROPONIN, ED    EKG EKG Interpretation  Date/Time:  Monday July 18 2018 15:25:09 EST Ventricular Rate:  116 PR Interval:    QRS Duration: 79 QT Interval:  323 QTC Calculation: 449 R Axis:   50 Text Interpretation:  Sinus tachycardia Anteroseptal infarct, age indeterminate No old tracing to compare Confirmed by Deno Etienne [phone]) on 07/18/2018 6:45:58 PM   Radiology Ct Angio Chest Pe W And/or Wo Contrast  Result Date: 07/18/2018 CLINICAL DATA:  Short of breath, chest pain. EXAM: CT ANGIOGRAPHY CHEST WITH CONTRAST TECHNIQUE: Multidetector CT imaging of the chest was performed using the standard protocol during bolus administration of intravenous contrast. Multiplanar CT image reconstructions and MIPs were obtained to evaluate the vascular anatomy. CONTRAST:  41mL ISOVUE-370 IOPAMIDOL (ISOVUE-370) INJECTION 76% COMPARISON:  CT 09/02/2015 FINDINGS: Cardiovascular: No filling defects within the pulmonary  arteries to suggest acute pulmonary  embolism. No acute findings of the aorta or great vessels. No pericardial fluid. Mediastinum/Nodes: No axillary or supraclavicular adenopathy. No mediastinal hilar adenopathy. No pericardial effusion. Esophagus normal Lungs/Pleura: Peripheral consolidation in the RIGHT upper lobe adjacent to a pathologic rib fracture measures 28 x 20 mm increased from 25 x 22 mm. Lesion is same shape as comparison exam although more thickened. Angular nodule in the RIGHT upper lobe adjacent to the consolidation measures 8 mm x 16 mm compared to 22 x 11. This nodule also appears slightly thickened compared to prior. No new pulmonary nodules. Centrilobular emphysema the upper lobes. Upper Abdomen: Limited view of the liver, kidneys, pancreas are unremarkable. Normal adrenal glands. Musculoskeletal: Scoliosis of the spine. Stable pathologic rib fracture anteriorly on the RIGHT second rib. Review of the MIP images confirms the above findings. IMPRESSION: 1. No evidence acute pulmonary embolism. 2. No acute finding of the aorta or great vessels. 3. Interval increase in size of RIGHT consolidative mass adjacent to a pathologic rib fracture. Favor progressive inflammatory process versus indolent carcinoma. 4. Adjacent angular RIGHT upper lobe nodule also slightly increased in thickness from 09/02/2015. Favored benign inflammatory thickening. Electronically Signed   By: Suzy Bouchard M.D.   On: 07/18/2018 18:59   Dg Chest Portable 1 View  Result Date: 07/18/2018 CLINICAL DATA:  Chest pain EXAM: PORTABLE CHEST 1 VIEW COMPARISON:  None. FINDINGS: The lungs are hyperinflated likely secondary to COPD. There is blunting of the left costophrenic angle likely reflecting a trace pleural effusion versus chronic pleural thickening. There is a right upper lobe spiculated airspace opacity likely reflecting posttreatment changes with recurrent or residual neoplasm not excluded. There is no pneumothorax. There is stable cardiomegaly. There is  thoracic aortic atherosclerosis. The osseous structures are unremarkable. IMPRESSION: Right upper lobe spiculated airspace opacity likely reflecting posttreatment changes with recurrent or residual neoplasm not excluded. COPD. Electronically Signed   By: Kathreen Devoid   On: 07/18/2018 16:07    Procedures Procedures (including critical care time)  Medications Ordered in ED Medications  iopamidol (ISOVUE-370) 76 % injection (has no administration in time range)  ipratropium-albuterol (DUONEB) 0.5-2.5 (3) MG/3ML nebulizer solution 3 mL (3 mLs Nebulization Given 07/18/18 1616)  iopamidol (ISOVUE-370) 76 % injection 75 mL (75 mLs Intravenous Contrast Given 07/18/18 1819)     Initial Impression / Assessment and Plan / ED Course  I have reviewed the triage vital signs and the nursing notes.  Pertinent labs & imaging results that were available during my care of the patient were reviewed by me and considered in my medical decision making (see chart for details).    Patient is an 84 year old female with history of breast cancer and prior lung cancer both in remission, COPD, hypertension, hyperlipidemia who presents to the emergency department today for evaluation of chest pain and shortness of breath.   Pt afebrile, hypoxic at presentation. Exam as above. ECG shows sinus tachycardia, no significant ST or T wave changes to suggest acute ischemia. Will obtain delta trops. PE also on differential. Lower on differential is pneumonia or COPD exacerbation.   CXR with no acute cardiopulm abnormalities. "Right upper lobe spiculated airspace opacity likely reflecting posttreatment changes with recurrent or residual neoplasm not excluded"  Given hypoxia, tachycardia, history of cancer high concern for PE. CTA obtained with no PE. Incidental CT findings discussed with patient and advised to f/u with PCP.   BMP, CBC unremarkable. No leukocytosis. Trop detectable but not elevated x2. Attempted  to wean O2 in ED  however pt became symptomatic again. No signs volume overload. Unclear etiology for new onset hypoxia. Could be viral though no real infectious signs or symptoms at this time. Given new O2 requirement recommended admission which pt is agreeable with. Hospitalist evaluated in ED and will admit.   Stable in ED w/no acute events. Stable at time of handoff to [place], [place] pending inpatient bed availability.   Case and plan of care discussed with Dr. Tyrone Nine.   Final Clinical Impressions(s) / ED Diagnoses   Final diagnoses:  Shortness of breath  Hypoxia    ED Discharge Orders    None       Corrie Dandy, MD 07/18/18 [place], [place], DO 07/18/18 2356

## 2018-07-18 NOTE — ED Notes (Signed)
Pt sitting on side of stretcher, no complaints voiced at this time

## 2018-07-18 NOTE — ED Notes (Signed)
PT taken off O2 for blood draw and to sit up in bed. PT dropped to low 60% O2 PT laid back in bed and put back on nasal cannula at 4LPM. PT O2 returned to low 90's. MD notified. PT denies SOB

## 2018-07-18 NOTE — ED Triage Notes (Signed)
Pt BIB [facility] from [facility] for eval of SOB w/ associated CP onset yesterday AM. CP is substernal worse w/ deep breath. Denies cough/fever, N/V, dizziness. Pt was initially 78% on RA on EMS arrival, placed on 4L [place] w/ improvement to 98%. LS diminished per EMS. EKG w/ nonspecific inferior changes.

## 2018-07-18 NOTE — Discharge Instructions (Signed)
Follow up with your primary care doctor regarding the following findings on your CT scan as we have discussed" "Interval increase in size of RIGHT consolidative mass adjacent to a pathologic rib fracture. Favor progressive inflammatory process versus indolent carcinoma. Adjacent angular RIGHT upper lobe nodule also slightly increased in thickness from 09/02/2015. Favored benign inflammatory Thickening."

## 2018-07-19 ENCOUNTER — Other Ambulatory Visit: Payer: Self-pay

## 2018-07-19 ENCOUNTER — Encounter (HOSPITAL_COMMUNITY): Payer: Self-pay | Admitting: Internal Medicine

## 2018-07-19 ENCOUNTER — Inpatient Hospital Stay (HOSPITAL_COMMUNITY): Payer: Medicare Other

## 2018-07-19 DIAGNOSIS — J9601 Acute respiratory failure with hypoxia: Secondary | ICD-10-CM

## 2018-07-19 DIAGNOSIS — I10 Essential (primary) hypertension: Secondary | ICD-10-CM

## 2018-07-19 DIAGNOSIS — J9621 Acute and chronic respiratory failure with hypoxia: Principal | ICD-10-CM

## 2018-07-19 DIAGNOSIS — I5030 Unspecified diastolic (congestive) heart failure: Secondary | ICD-10-CM

## 2018-07-19 DIAGNOSIS — E118 Type 2 diabetes mellitus with unspecified complications: Secondary | ICD-10-CM

## 2018-07-19 DIAGNOSIS — E119 Type 2 diabetes mellitus without complications: Secondary | ICD-10-CM

## 2018-07-19 DIAGNOSIS — D649 Anemia, unspecified: Secondary | ICD-10-CM | POA: Diagnosis present

## 2018-07-19 DIAGNOSIS — R918 Other nonspecific abnormal finding of lung field: Secondary | ICD-10-CM

## 2018-07-19 LAB — CBC WITH DIFFERENTIAL/PLATELET
Abs Immature Granulocytes: 0.02 10*3/uL (ref 0.00–0.07)
Basophils Absolute: 0 10*3/uL (ref 0.0–0.1)
Basophils Relative: 1 %
Eosinophils Absolute: 0 10*3/uL (ref 0.0–0.5)
Eosinophils Relative: 1 %
HCT: 36 % (ref 36.0–46.0)
Hemoglobin: 10.9 g/dL — ABNORMAL LOW (ref 12.0–15.0)
Immature Granulocytes: 0 %
Lymphocytes Relative: 9 %
Lymphs Abs: 0.5 10*3/uL — ABNORMAL LOW (ref 0.7–4.0)
MCH: 30.7 pg (ref 26.0–34.0)
MCHC: 30.3 g/dL (ref 30.0–36.0)
MCV: 101.4 fL — ABNORMAL HIGH (ref 80.0–100.0)
Monocytes Absolute: 0.5 10*3/uL (ref 0.1–1.0)
Monocytes Relative: 9 %
Neutro Abs: 4.5 10*3/uL (ref 1.7–7.7)
Neutrophils Relative %: 80 %
Platelets: 200 10*3/uL (ref 150–400)
RBC: 3.55 MIL/uL — ABNORMAL LOW (ref 3.87–5.11)
RDW: 13.2 % (ref 11.5–15.5)
WBC: 5.6 10*3/uL (ref 4.0–10.5)
nRBC: 0 % (ref 0.0–0.2)

## 2018-07-19 LAB — ECHOCARDIOGRAM COMPLETE
Height: 65 in
Weight: 1675.5 oz

## 2018-07-19 LAB — HEPATIC FUNCTION PANEL
ALT: 12 U/L (ref 0–44)
AST: 17 U/L (ref 15–41)
Albumin: 3.3 g/dL — ABNORMAL LOW (ref 3.5–5.0)
Alkaline Phosphatase: 41 U/L (ref 38–126)
Bilirubin, Direct: 0.2 mg/dL (ref 0.0–0.2)
Indirect Bilirubin: 0.6 mg/dL (ref 0.3–0.9)
Total Bilirubin: 0.8 mg/dL (ref 0.3–1.2)
Total Protein: 5.6 g/dL — ABNORMAL LOW (ref 6.5–8.1)

## 2018-07-19 LAB — BASIC METABOLIC PANEL
Anion gap: 11 (ref 5–15)
BUN: 17 mg/dL (ref 8–23)
CO2: 30 mmol/L (ref 22–32)
Calcium: 8.5 mg/dL — ABNORMAL LOW (ref 8.9–10.3)
Chloride: 92 mmol/L — ABNORMAL LOW (ref 98–111)
Creatinine, Ser: 0.77 mg/dL (ref 0.44–1.00)
GFR calc Af Amer: >60 mL/min (ref >60)
GFR calc non Af Amer: >60 mL/min (ref >60)
Glucose, Bld: 89 mg/dL (ref 70–99)
Potassium: 4 mmol/L (ref 3.5–5.1)
Sodium: 133 mmol/L — ABNORMAL LOW (ref 135–145)

## 2018-07-19 LAB — PROCALCITONIN: Procalcitonin: <0.1 ng/mL

## 2018-07-19 LAB — SEDIMENTATION RATE: Sed Rate: 2 mm/hr (ref 0–22)

## 2018-07-19 LAB — GLUCOSE, CAPILLARY
Glucose-Capillary: 159 mg/dL — ABNORMAL HIGH (ref 70–99)
Glucose-Capillary: 163 mg/dL — ABNORMAL HIGH (ref 70–99)
Glucose-Capillary: 98 mg/dL (ref 70–99)
Glucose-Capillary: 90 mg/dL (ref 70–99)

## 2018-07-19 LAB — TROPONIN I: Troponin I: <0.03 ng/mL (ref <0.03)

## 2018-07-19 MED ORDER — ALBUTEROL SULFATE (2.5 MG/3ML) 0.083% IN NEBU
2.5000 mg | INHALATION_SOLUTION | RESPIRATORY_TRACT | Status: DC
  Administered 2018-07-19: 2.5 mg via RESPIRATORY_TRACT
  Filled 2018-07-19 (×2): qty 3

## 2018-07-19 MED ORDER — ACETAMINOPHEN 325 MG PO TABS
650.0000 mg | ORAL_TABLET | Freq: Four times a day (QID) | ORAL | Status: DC | PRN
  Filled 2018-07-19: qty 2

## 2018-07-19 MED ORDER — LEVOTHYROXINE SODIUM 50 MCG PO TABS
50.0000 ug | ORAL_TABLET | Freq: Every day | ORAL | Status: DC
  Administered 2018-07-19 – 2018-07-21 (×3): 50 ug via ORAL
  Filled 2018-07-19 (×4): qty 1

## 2018-07-19 MED ORDER — ONDANSETRON HCL 4 MG PO TABS: 4.0000 mg | ORAL_TABLET | Freq: Four times a day (QID) | ORAL | Status: DC | PRN

## 2018-07-19 MED ORDER — NIFEDIPINE ER OSMOTIC RELEASE 30 MG PO TB24
30.0000 mg | ORAL_TABLET | Freq: Every day | ORAL | Status: DC
  Administered 2018-07-19 – 2018-07-21 (×3): 30 mg via ORAL
  Filled 2018-07-19 (×4): qty 1

## 2018-07-19 MED ORDER — DOXYCYCLINE HYCLATE 100 MG PO TABS
100.0000 mg | ORAL_TABLET | Freq: Two times a day (BID) | ORAL | Status: DC
  Administered 2018-07-19: 100 mg via ORAL
  Filled 2018-07-19: qty 1

## 2018-07-19 MED ORDER — HYDRALAZINE HCL 20 MG/ML IJ SOLN: 5.0000 mg | INTRAMUSCULAR | Status: DC | PRN

## 2018-07-19 MED ORDER — ASPIRIN EC 81 MG PO TBEC
81.0000 mg | DELAYED_RELEASE_TABLET | Freq: Every day | ORAL | Status: DC
  Administered 2018-07-19 – 2018-07-20 (×2): 81 mg via ORAL
  Filled 2018-07-19 (×2): qty 1

## 2018-07-19 MED ORDER — ONDANSETRON HCL 4 MG/2ML IJ SOLN: 4.0000 mg | Freq: Four times a day (QID) | INTRAMUSCULAR | Status: DC | PRN

## 2018-07-19 MED ORDER — METOPROLOL SUCCINATE ER 25 MG PO TB24
25.0000 mg | ORAL_TABLET | Freq: Every day | ORAL | Status: DC
  Administered 2018-07-19 – 2018-07-20 (×2): 25 mg via ORAL
  Filled 2018-07-19 (×2): qty 1

## 2018-07-19 MED ORDER — INSULIN ASPART 100 UNIT/ML ~~LOC~~ SOLN: 0.0000 [IU] | Freq: Three times a day (TID) | SUBCUTANEOUS | Status: DC

## 2018-07-19 MED ORDER — ACETAMINOPHEN 650 MG RE SUPP: 650.0000 mg | Freq: Four times a day (QID) | RECTAL | Status: DC | PRN

## 2018-07-19 MED ORDER — LOSARTAN POTASSIUM 50 MG PO TABS
100.0000 mg | ORAL_TABLET | Freq: Every day | ORAL | Status: DC
  Administered 2018-07-19 – 2018-07-21 (×3): 100 mg via ORAL
  Filled 2018-07-19 (×4): qty 2

## 2018-07-19 MED ORDER — TIOTROPIUM BROMIDE MONOHYDRATE 18 MCG IN CAPS
18.0000 ug | ORAL_CAPSULE | Freq: Every day | RESPIRATORY_TRACT | Status: DC
  Filled 2018-07-19: qty 5

## 2018-07-19 MED ORDER — UMECLIDINIUM BROMIDE 62.5 MCG/INH IN AEPB
1.0000 | INHALATION_SPRAY | Freq: Every day | RESPIRATORY_TRACT | Status: DC
  Administered 2018-07-19 – 2018-07-21 (×3): 1 via RESPIRATORY_TRACT
  Filled 2018-07-19: qty 7

## 2018-07-19 MED ORDER — ALBUTEROL SULFATE (2.5 MG/3ML) 0.083% IN NEBU: 2.5000 mg | INHALATION_SOLUTION | RESPIRATORY_TRACT | Status: DC | PRN

## 2018-07-19 MED ORDER — ENOXAPARIN SODIUM 40 MG/0.4ML ~~LOC~~ SOLN
40.0000 mg | SUBCUTANEOUS | Status: DC
  Administered 2018-07-19 – 2018-07-20 (×2): 40 mg via SUBCUTANEOUS
  Filled 2018-07-19 (×3): qty 0.4

## 2018-07-19 NOTE — Consult Note (Signed)
NAME:  Charlotte Harris, MRN:  [id number], DOB:  April 15, 1932, LOS: 1 ADMISSION DATE:  07/18/2018, CONSULTATION DATE:  07/19/18 REFERRING MD:  Dr. Wynelle Cleveland / [facility], CHIEF COMPLAINT:  Dyspnea, Hypoxia    Brief History   84 y/o F with PMH of adenocarcinoma of the lung s/p XRT to RUL (released from Dr. Ida Rogue care in 2017) who presented to [facility] on 12/17 with SOB.  CTA of the chest on admit was negative for PE but showed an interval increase in size of right consolidative mass adjacent to a pathologic rib fracture.  PCCM consulted for evaluation of abnormal CT & dyspnea.   History of present illness   84 y/o F with PMH of adenocarcinoma of the lung s/p XRT to RUL (released from Dr. Ida Rogue care in 2017) who presented to [facility] on 12/17 with SOB.  CTA of the chest on admit was negative for PE but showed an interval increase in size of right consolidative mass adjacent to a pathologic rib fracture.  PCCM consulted for evaluation of abnormal CT & dyspnea.   The patient reports she began smoking around age 79 and quit at 85.  At her heaviest, she smoked 1.5 ppd.  Her late husband smoked as well.  She worked in [place].  No occupational exposures.  She is not oxygen dependent at baseline.  Reports she has an oximeter at home and watches her saturations > typically run ~ 90%.  She does note she will dip to 88% at times.  She denies significant weight loss over the past few months but reports she does not eat much as it is "difficult to cook for just one".  She reports little appetite.  In the last few years she lost weight due to her prior cancer diagnosis.  She does not typically have flares of her COPD and can not remember the last time, if ever, she needed prednisone.   Reports she began having increased SOB that began on 12/10 and worsened as the days progressed.  She denies fevers, chills, cough, sputum production.  States she sought care on 12/16 due to worsening shortness of breath.  Her sats at time of evaluation  were in the 70's.  She was admitted per [facility] for further evaluation of hypoxia.  CTA of the chest was negative for PE but raised concern for possible increase in known RUL consolidative mass.    PCCM consulted for evaluation.         Past Medical History  RUL Adenocarcinoma - s/p XRT, released from Dr. Lisbeth Renshaw in 2017 COPD  HTN Breast Cancer - right mastectomy  Significant Hospital Events   12/16  Admit with dyspnea, found to be hypoxic  Consults:  PCCM 12/17   Procedures:    Significant Diagnostic Tests:  CTA Chest 12/16 >> neg for PE, interval increase in size of right consolidative mass adjacent to a pathologic rib fracture.  Favor progressive inflammatory process vs indolent carcinoma.  Adjacent angular RUL nodule slightly increased in thickness since 08/2015.   ECHO 12/17 >> LVEF 65-70%, grade 1 DD, pulmonary artery normal sized, RA normal sized, mild TR  Micro Data:     Antimicrobials:    Interim history/subjective:  As above.  Pt is anxious to go home.  She has a Chiropodist that needs to be completed.   Objective   Blood pressure 126/69, pulse (!) 110, temperature 98.8 F (37.1 C), temperature source Oral, resp. rate (!) 23, height 5\' 5"  (1.651 m),  weight 47.5 kg, SpO2 99 %.    FiO2 (%):  [0 %] 0 %  No intake or output data in the 24 hours ending 07/19/18 1416 Filed Weights   07/18/18 1527 07/19/18 0848  Weight: 44.9 kg 47.5 kg    Examination: General:  Thin, elderly female lying in bed in NAD HEENT: MM pink/moist, no jvd Neuro: asleep / wakes to voice, appropriate, MAE / normal strength  CV: s1s2 rrr, no m/r/g PULM: even/non-labored, lungs bilaterally diminished, few fine posterior lateral crackles  ZO:XWRU, non-tender, bsx4 active  Extremities: warm/dry, no edema  Skin: no rashes or lesions  Resolved Hospital Problem list      Assessment & Plan:   Acute Hypoxic Respiratory Failure  -suspect multifactorial in setting of suspected  COPD, hx of RUL lung cancer P: Wean O2 for sats 88-95% Assess for ambulatory O2 needs prior to discharge    COPD -no PFT's  -little symptom burden per patient hx P: Discharge home med plan: Spiriva, PRN albuterol Will need outpatient PFT's (at some point)   RUL Lung Mass  -hx RUL adenocarcinoma s/p XRT  -concern for enlarging mass vs inflammatory process on CT imaging  P: Outpatient follow up with Dr. Lisbeth Renshaw at discharge Plan for 7 days doxycycline  Repeat PET-CT to evaluate RUL mass after antibiotics complete.  No infectious prodrome by history.   Follow up with Wyn Quaker, NP as scheduled (see discharge section)   Best practice:  Diet: Heart healthy, carb modified  Pain/Anxiety/Delirium protocol (if indicated): n/a VAP protocol (if indicated): n/a DVT prophylaxis: Lovenox  GI prophylaxis: n/a Glucose control: per primary  Mobility: as tolerated  Code Status: Full  Family Communication: Patient updated on plan of care  Disposition: per primary.  OK to discharge home from pulmonary standpoint after seen by Dr. Chase Caller.    Labs   CBC: Recent Labs  Lab 07/18/18 1657 07/18/18 1706 07/19/18 0646  WBC 7.2  --  5.6  NEUTROABS 5.3  --  4.5  HGB 11.3* 12.9 10.9*  HCT 38.9 38.0 36.0  MCV 102.4*  --  101.4*  PLT 232  --  045    Basic Metabolic Panel: Recent Labs  Lab 07/18/18 1657 07/18/18 1706 07/19/18 0646  NA 141 139 133*  K 4.1 4.0 4.0  CL 98 97* 92*  CO2 35*  --  30  GLUCOSE 97 90 89  BUN 18 21 17   CREATININE 0.71 0.70 0.77  CALCIUM 9.5  --  8.5*   GFR: Estimated Creatinine Clearance: 39.3 mL/min (by C-G formula based on SCr of 0.77 mg/dL). Recent Labs  Lab 07/18/18 1657 07/19/18 0646  WBC 7.2 5.6    Liver Function Tests: Recent Labs  Lab 07/19/18 0646  AST 17  ALT 12  ALKPHOS 41  BILITOT 0.8  PROT 5.6*  ALBUMIN 3.3*   No results for input(s): LIPASE, AMYLASE in the last 168 hours. No results for input(s): AMMONIA in the last 168  hours.  ABG    Component Value Date/Time   TCO2 37 (H) 07/18/2018 1706     Coagulation Profile: No results for input(s): INR, PROTIME in the last 168 hours.  Cardiac Enzymes: Recent Labs  Lab 07/19/18 0646  TROPONINI <0.03    HbA1C: Hemoglobin A1C  Date/Time Value Ref Range Status  01/31/2018 09:54 AM 6.0 (A) 4.0 - 5.6 % Final  07/21/2017 11:10 AM 6.0  Final   Hgb A1c MFr Bld  Date/Time Value Ref Range Status  01/25/2017 11:26 AM  6.1 4.6 - 6.5 % Final    Comment:    Glycemic Control Guidelines for People with Diabetes:Non Diabetic:  <6%Goal of Therapy: <7%Additional Action Suggested:  >8%   11/15/2014 12:02 PM 6.0 4.6 - 6.5 % Final    Comment:    Glycemic Control Guidelines for People with Diabetes:Non Diabetic:  <6%Goal of Therapy: <7%Additional Action Suggested:  >8%     CBG: Recent Labs  Lab 07/18/18 1530 07/19/18 0859 07/19/18 1214  GLUCAP 94 159* 163*    Review of Systems: Positives in [place]   Gen: Denies fever, chills, weight change, fatigue, night sweats HEENT: Denies blurred vision, double vision, hearing loss, tinnitus, sinus congestion, rhinorrhea, sore throat, neck stiffness, dysphagia PULM: Denies shortness of breath, cough, sputum production, hemoptysis, wheezing CV: Denies chest pain, edema, orthopnea, paroxysmal nocturnal dyspnea, palpitations GI: Denies abdominal pain, nausea, vomiting, diarrhea, hematochezia, melena, constipation, change in bowel habits.  Decreased appetite  GU: Denies dysuria, hematuria, polyuria, oliguria, urethral discharge Endocrine: Denies hot or cold intolerance, polyuria, polyphagia or appetite change Derm: Denies rash, dry skin, scaling or peeling skin change Heme: Denies easy bruising, bleeding, bleeding gums Neuro: Denies headache, numbness, weakness, slurred speech, loss of memory or consciousness   Past Medical History  She,  has a past medical history of ADENOCARCINOMA, LUNG (07/03/2010), AODM (06/21/2007),  ASYMPTOMATIC POSTMENOPAUSAL STATUS (07/02/2009), Breast cancer ([place]) (1991), BREAST CANCER, HX OF (04/16/2007), Cancer ([place]), COLONIC POLYPS, HX OF (04/16/2007), COPD (05/17/2008), HEMATURIA, HX OF (04/16/2007), History of radiation therapy (07/30/09 to 08/09/09), HYPERLIPIDEMIA (04/16/2007), Hypertension, HYPERTENSION (04/16/2007), HYPOTHYROIDISM, POST-RADIATION (06/07/2008), Osteoarthritis, OSTEOPOROSIS (07/02/2009), PERIPHERAL EDEMA (02/15/2009), and Wrist fracture, left.   Surgical History    Past Surgical History:  Procedure Laterality Date  . BREAST SURGERY     lumpectomy  . EYE SURGERY Bilateral    cataracts  . 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 LUNG BIOPSY  05/22/2009   RUL lung=Adenocarcinoma  . MASTECTOMY  1991   right  . TONSILLECTOMY  1955     Social History   reports that she quit smoking about 8 years ago. Her smoking use included cigarettes. She has a 50.00 pack-year smoking history. She has never used smokeless tobacco. She reports current alcohol use of about 2.0 standard drinks of alcohol per week. She reports that she does not use drugs.   Family History   Her family history includes Cancer in her brother, brother, and sister; Heart attack (age of onset: 74) in her mother; Heart disease in her father, mother, sister, and another family member; Hypertension in an other family member. There is no history of Colon cancer or Stomach cancer.   Allergies No Known Allergies   Home Medications  Prior to Admission medications   Medication Sig Start Date End Date Taking? Authorizing Provider  acetaminophen (TYLENOL) 500 MG tablet Take 500 mg by mouth every 6 (six) hours as needed for headache (pain).    Yes [provider]  Albuterol Sulfate 108 (90 Base) MCG/ACT AEPB Inhale 1 puff into the lungs 4 (four) times daily as needed. Patient taking differently: Inhale 1 puff into the lungs 4 (four) times daily as needed (shortness of breath/  wheezing).  06/17/18  Yes Caren Macadam, MD  aspirin EC 81 MG tablet Take 81 mg by mouth at bedtime.   Yes [provider]  Calcium Carb-Cholecalciferol (CALCIUM 1000 + D PO) Take 2 tablets by mouth daily.   Yes [provider]  levothyroxine (  SYNTHROID, LEVOTHROID) 50 MCG tablet TAKE 1 TABLET BY MOUTH EVERY DAY Patient taking differently: Take 50 mcg by mouth daily before breakfast.  05/19/18  Yes Renato Shin, MD  losartan (COZAAR) 100 MG tablet TAKE 1 TABLET (100 MG TOTAL) BY MOUTH DAILY. Patient taking differently: Take 100 mg by mouth daily.  07/11/18  Yes Renato Shin, MD  metFORMIN (GLUCOPHAGE-XR) 500 MG 24 hr tablet Take 1 tablet (500 mg total) by mouth daily. Patient taking differently: Take 500 mg by mouth daily after supper.  07/22/17  Yes Renato Shin, MD  metoprolol succinate (TOPROL-XL) 25 MG 24 hr tablet Take 1 tablet (25 mg total) by mouth daily. Patient taking differently: Take 25 mg by mouth daily after supper.  01/31/18  Yes Renato Shin, MD  NIFEdipine (PROCARDIA-XL/NIFEDICAL-XL) 30 MG 24 hr tablet TAKE 1 TABLET BY MOUTH EVERY MORNING Patient taking differently: Take 30 mg by mouth daily.  07/11/18  Yes Renato Shin, MD  SPIRIVA HANDIHALER 18 MCG inhalation capsule INHALE 1 CAPSULE VIA HANDIHALER ONCE DAILY AT [place] Patient taking differently: Place 18 mcg into inhaler and inhale daily.  01/09/18  Yes Renato Shin, MD  Spacer/Aero-Holding Chambers (E-Z SPACER) inhaler Use as instructed 06/17/18   Caren Macadam, MD          Noe Gens, NP-C [facility] Pgr: [phone] or if no answer [phone] 07/19/2018, 2:16 PM

## 2018-07-19 NOTE — H&P (Addendum)
History and Physical    Charlotte Harris VQQ:[id number] DOB: [birth year] DOA: 07/18/2018  PCP: Caren Macadam, MD  Patient coming from: Home.  Chief Complaint: Shortness of breath.  HPI: Charlotte Harris is a 84 y.o. female with history of COPD, hypertension, diabetes mellitus, previous history of lung cancer and breast cancer being followed by Dr. Julien Nordmann presents with worsening shortness of breath over the last 24 hours.  Denies any chest pain.  Denies any productive sputum or fever or chills.  ED Course: In the ER patient is requiring 5 L oxygen to maintain sats.  CAT scan of the chest was done which shows worsening consolidation differentials include inflammatory mass versus indolent carcinoma.  On my exam patient is chest appears tight for which nebulizer has been placed.  EKG shows sinus tachycardia with some T wave inversions.  Review of Systems: As per HPI, rest all negative.   Past Medical History:  Diagnosis Date  . ADENOCARCINOMA, LUNG 07/03/2010   RUL  . AODM 06/21/2007  . ASYMPTOMATIC POSTMENOPAUSAL STATUS 07/02/2009  . Breast cancer ([place]) 1991   R mastectomy, no chemo or radiation  . BREAST CANCER, HX OF 04/16/2007  . Cancer ([place])   . COLONIC POLYPS, HX OF 04/16/2007  . COPD 05/17/2008  . HEMATURIA, HX OF 04/16/2007  . History of radiation therapy 07/30/09 to 08/09/09   RUL lung  . HYPERLIPIDEMIA 04/16/2007  . Hypertension   . HYPERTENSION 04/16/2007  . HYPOTHYROIDISM, POST-RADIATION 06/07/2008  . Osteoarthritis   . OSTEOPOROSIS 07/02/2009  . PERIPHERAL EDEMA 02/15/2009  . Wrist fracture, left     Past Surgical History:  Procedure Laterality Date  . BREAST SURGERY     lumpectomy  . EYE SURGERY Bilateral    cataracts  . 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 LUNG BIOPSY  05/22/2009   RUL lung=Adenocarcinoma  . MASTECTOMY  1991   right  . TONSILLECTOMY  1955     reports that she quit smoking about 8 years ago. Her smoking  use included cigarettes. She has a 50.00 pack-year smoking history. She has never used smokeless tobacco. She reports current alcohol use of about 2.0 standard drinks of alcohol per week. She reports that she does not use drugs.  No Known Allergies  Family History  Problem Relation Age of Onset  . Heart attack Mother 13  . Heart disease Mother   . Cancer Sister        "throat" cancer  . Heart disease Father   . Cancer Brother        blood  . Heart disease Sister   . Cancer Brother        blood  . Heart disease Other        CAD  . Hypertension Other   . Colon cancer Neg Hx   . Stomach cancer Neg Hx     Prior to Admission medications   Medication Sig Start Date End Date Taking? Authorizing Provider  acetaminophen (TYLENOL) 500 MG tablet Take 500 mg by mouth every 6 (six) hours as needed for headache (pain).    Yes [provider]  Albuterol Sulfate 108 (90 Base) MCG/ACT AEPB Inhale 1 puff into the lungs 4 (four) times daily as needed. Patient taking differently: Inhale 1 puff into the lungs 4 (four) times daily as needed (shortness of breath/ wheezing).  06/17/18  Yes Caren Macadam, MD  aspirin EC 81 MG tablet Take 81 mg by  mouth at bedtime.   Yes [provider]  Calcium Carb-Cholecalciferol (CALCIUM 1000 + D PO) Take 2 tablets by mouth daily.   Yes [provider]  levothyroxine (SYNTHROID, LEVOTHROID) 50 MCG tablet TAKE 1 TABLET BY MOUTH EVERY DAY Patient taking differently: Take 50 mcg by mouth daily before breakfast.  05/19/18  Yes Renato Shin, MD  losartan (COZAAR) 100 MG tablet TAKE 1 TABLET (100 MG TOTAL) BY MOUTH DAILY. Patient taking differently: Take 100 mg by mouth daily.  07/11/18  Yes Renato Shin, MD  metFORMIN (GLUCOPHAGE-XR) 500 MG 24 hr tablet Take 1 tablet (500 mg total) by mouth daily. Patient taking differently: Take 500 mg by mouth daily after supper.  07/22/17  Yes Renato Shin, MD  metoprolol succinate (TOPROL-XL) 25 MG 24  hr tablet Take 1 tablet (25 mg total) by mouth daily. Patient taking differently: Take 25 mg by mouth daily after supper.  01/31/18  Yes Renato Shin, MD  NIFEdipine (PROCARDIA-XL/NIFEDICAL-XL) 30 MG 24 hr tablet TAKE 1 TABLET BY MOUTH EVERY MORNING Patient taking differently: Take 30 mg by mouth daily.  07/11/18  Yes Renato Shin, MD  SPIRIVA HANDIHALER 18 MCG inhalation capsule INHALE 1 CAPSULE VIA HANDIHALER ONCE DAILY AT [place] Patient taking differently: Place 18 mcg into inhaler and inhale daily.  01/09/18  Yes Renato Shin, MD  Spacer/Aero-Holding Chambers (E-Z SPACER) inhaler Use as instructed 06/17/18   Caren Macadam, MD    Physical Exam: Vitals:   07/19/18 0430 07/19/18 0500 07/19/18 0530 07/19/18 0600  BP: (!) 176/89 (!) 166/83 (!) 164/90 (!) 160/83  Pulse: (!) 107 (!) 104 (!) 111 (!) 117  Resp: 17 16 19  (!) 23  Temp:      TempSrc:      SpO2: 97% 96% 97% 94%  Weight:      Height:          Constitutional: Moderately built and nourished. Vitals:   07/19/18 0430 07/19/18 0500 07/19/18 0530 07/19/18 0600  BP: (!) 176/89 (!) 166/83 (!) 164/90 (!) 160/83  Pulse: (!) 107 (!) 104 (!) 111 (!) 117  Resp: 17 16 19  (!) 23  Temp:      TempSrc:      SpO2: 97% 96% 97% 94%  Weight:      Height:       Eyes: Anicteric no pallor. ENMT: No discharge from the ears eyes nose or mouth. Neck: No mass felt.  No neck rigidity. Respiratory: Chest appears tight bilateral air entry present mild wheezing.  No crepitations. Cardiovascular: S1-S2 heard. Abdomen: Soft nontender bowel sounds present. Musculoskeletal: No edema. Skin: No rash.   Neurologic: Alert awake oriented to time place and person.  Moves all extremities. Psychiatric: Appears normal.   Labs on Admission: I have personally reviewed following labs and imaging studies  CBC: Recent Labs  Lab 07/18/18 1657 07/18/18 1706  WBC 7.2  --   NEUTROABS 5.3  --   HGB 11.3* 12.9  HCT 38.9 38.0  MCV 102.4*   --   PLT 232  --    Basic Metabolic Panel: Recent Labs  Lab 07/18/18 1657 07/18/18 1706  NA 141 139  K 4.1 4.0  CL 98 97*  CO2 35*  --   GLUCOSE 97 90  BUN 18 21  CREATININE 0.71 0.70  CALCIUM 9.5  --    GFR: Estimated Creatinine Clearance: 37.1 mL/min (by C-G formula based on SCr of 0.7 mg/dL). Liver Function Tests: No results for input(s):  AST, ALT, ALKPHOS, BILITOT, PROT, ALBUMIN in the last 168 hours. No results for input(s): LIPASE, AMYLASE in the last 168 hours. No results for input(s): AMMONIA in the last 168 hours. Coagulation Profile: No results for input(s): INR, PROTIME in the last 168 hours. Cardiac Enzymes: No results for input(s): CKTOTAL, CKMB, CKMBINDEX, TROPONINI in the last 168 hours. BNP (last 3 results) No results for input(s): PROBNP in the last 8760 hours. HbA1C: No results for input(s): HGBA1C in the last 72 hours. CBG: Recent Labs  Lab 07/18/18 1530  GLUCAP 94   Lipid Profile: No results for input(s): CHOL, HDL, LDLCALC, TRIG, CHOLHDL, LDLDIRECT in the last 72 hours. Thyroid Function Tests: No results for input(s): TSH, T4TOTAL, FREET4, T3FREE, THYROIDAB in the last 72 hours. Anemia Panel: No results for input(s): VITAMINB12, FOLATE, FERRITIN, TIBC, IRON, RETICCTPCT in the last 72 hours. Urine analysis:    Component Value Date/Time   COLORURINE YELLOW 01/31/2018 1057   APPEARANCEUR CLEAR 01/31/2018 1057   LABSPEC 1.010 01/31/2018 1057   PHURINE 7.0 01/31/2018 1057   GLUCOSEU NEGATIVE 01/31/2018 1057   HGBUR NEGATIVE 01/31/2018 1057   BILIRUBINUR NEGATIVE 01/31/2018 1057   KETONESUR NEGATIVE 01/31/2018 1057   PROTEINUR NEG 07/05/2012 1409   UROBILINOGEN 0.2 01/31/2018 1057   NITRITE NEGATIVE 01/31/2018 1057   LEUKOCYTESUR NEGATIVE 01/31/2018 1057   Sepsis Labs: @LABRCNTIP (procalcitonin:4,lacticidven:4) )No results found for this or any previous visit (from the past 240 hour(s)).   Radiological Exams on Admission: Ct Angio Chest  Pe W And/or Wo Contrast  Result Date: 07/18/2018 CLINICAL DATA:  Short of breath, chest pain. EXAM: CT ANGIOGRAPHY CHEST WITH CONTRAST TECHNIQUE: Multidetector CT imaging of the chest was performed using the standard protocol during bolus administration of intravenous contrast. Multiplanar CT image reconstructions and MIPs were obtained to evaluate the vascular anatomy. CONTRAST:  20mL ISOVUE-370 IOPAMIDOL (ISOVUE-370) INJECTION 76% COMPARISON:  CT 09/02/2015 FINDINGS: Cardiovascular: No filling defects within the pulmonary arteries to suggest acute pulmonary embolism. No acute findings of the aorta or great vessels. No pericardial fluid. Mediastinum/Nodes: No axillary or supraclavicular adenopathy. No mediastinal hilar adenopathy. No pericardial effusion. Esophagus normal Lungs/Pleura: Peripheral consolidation in the RIGHT upper lobe adjacent to a pathologic rib fracture measures 28 x 20 mm increased from 25 x 22 mm. Lesion is same shape as comparison exam although more thickened. Angular nodule in the RIGHT upper lobe adjacent to the consolidation measures 8 mm x 16 mm compared to 22 x 11. This nodule also appears slightly thickened compared to prior. No new pulmonary nodules. Centrilobular emphysema the upper lobes. Upper Abdomen: Limited view of the liver, kidneys, pancreas are unremarkable. Normal adrenal glands. Musculoskeletal: Scoliosis of the spine. Stable pathologic rib fracture anteriorly on the RIGHT second rib. Review of the MIP images confirms the above findings. IMPRESSION: 1. No evidence acute pulmonary embolism. 2. No acute finding of the aorta or great vessels. 3. Interval increase in size of RIGHT consolidative mass adjacent to a pathologic rib fracture. Favor progressive inflammatory process versus indolent carcinoma. 4. Adjacent angular RIGHT upper lobe nodule also slightly increased in thickness from 09/02/2015. Favored benign inflammatory thickening. Electronically Signed   By: Suzy Bouchard M.D.   On: 07/18/2018 18:59   Dg Chest Portable 1 View  Result Date: 07/18/2018 CLINICAL DATA:  Chest pain EXAM: PORTABLE CHEST 1 VIEW COMPARISON:  None. FINDINGS: The lungs are hyperinflated likely secondary to COPD. There is blunting of the left costophrenic angle likely reflecting a trace pleural effusion versus chronic pleural  thickening. There is a right upper lobe spiculated airspace opacity likely reflecting posttreatment changes with recurrent or residual neoplasm not excluded. There is no pneumothorax. There is stable cardiomegaly. There is thoracic aortic atherosclerosis. The osseous structures are unremarkable. IMPRESSION: Right upper lobe spiculated airspace opacity likely reflecting posttreatment changes with recurrent or residual neoplasm not excluded. COPD. Electronically Signed   By: Kathreen Devoid   On: 07/18/2018 16:07    EKG: Independently reviewed.  Sinus tachycardia with T wave inversions.  Assessment/Plan Active Problems:   Acute respiratory failure with hypoxia (HCC)    1. Acute respiratory failure with hypoxia -could be from COPD but CAT scan shows worsening consolidation with differentials including inflammatory mass versus indolent carcinoma.  Discussed with Dr. Bryon Lions pulmonologist will be seeing patient in consult for now I have placed patient on scheduled albuterol nebulizer with Spiriva.  We will check 2D echo cycle cardiac markers patient also has uncontrolled blood pressure which could be continued waiting. 2. Uncontrolled hypertension on home dose of ARB nifedipine metoprolol.  Will add PRN IV hydralazine. 3. Diabetes mellitus type 2 we will keep patient on sliding scale coverage. 4. Anemia appears to be worsening from previous.  Follow CBC. 5. History of lung cancer see #1. 6. History of breast cancer in remission. 7. Hypothyroidism on Synthroid.   DVT prophylaxis: Lovenox. Code Status: Full code. Family Communication: Discussed with  patient. Disposition Plan: Home. Consults called: Pulmonologist. Admission status: Inpatient.   Rise Patience MD [facility] Pager [phone].  If 7PM-7AM, please contact night-coverage [email or web address] Password [facility]  07/19/2018, 6:44 AM

## 2018-07-19 NOTE — Progress Notes (Signed)
[facility]  I have reviewed the chart and examined the patient.   Charlotte Harris is a 84 y.o. female with history of COPD, hypertension, diabetes mellitus, previous history of lung cancer and breast cancer being followed by Dr. Julien Nordmann presents with worsening shortness of breath over the last 24 hours.  Initially required 5 L but now is down to 2 L. She did received neb treatments in the ED. CT chest:  Interval increase in size of RIGHT consolidative mass adjacent to a pathologic rib fracture. Favor progressive inflammatory process versus indolent carcinoma.  On exam today the patient has poor air entry in both lung fields but no wheezing or rhonchi. Her pulse ox is in the 96s on 2 L.   Principal Problem:   Acute respiratory failure with hypoxia    - underlying COPD and Lung CA - pulm consulted to assist with diagnosis -  Cont Substitute for Spiriva which she takes as outpt- add albuterol neb PRN  - 2 D ECHO ordered as well by admitting physician- follow  Active Problems:   Hypothyroidism following radioiodine therapy Synthroid    Hyperlipidemia associated with type 2 diabetes mellitus (HCC) - hold metformin and cont SSI    HTN (hypertension) - Losartan, Nifedipine and Toprol resumed  Anemia - likely of chronic disease    BREAST CANCER, HX OF    Debbe Odea, MD Pager : [email or web address]

## 2018-07-19 NOTE — Progress Notes (Signed)
  Echocardiogram 2D Echocardiogram has been performed.  Charlotte Harris 07/19/2018, 11:09 AM

## 2018-07-20 ENCOUNTER — Inpatient Hospital Stay (HOSPITAL_COMMUNITY): Payer: Medicare Other

## 2018-07-20 DIAGNOSIS — R918 Other nonspecific abnormal finding of lung field: Secondary | ICD-10-CM

## 2018-07-20 DIAGNOSIS — J439 Emphysema, unspecified: Secondary | ICD-10-CM

## 2018-07-20 DIAGNOSIS — R0902 Hypoxemia: Secondary | ICD-10-CM

## 2018-07-20 LAB — BLOOD GAS, ARTERIAL
Acid-Base Excess: 10.2 mmol/L — ABNORMAL HIGH (ref 0.0–2.0)
Bicarbonate: 36.3 mmol/L — ABNORMAL HIGH (ref 20.0–28.0)
O2 Content: 2 L/min
O2 Saturation: 94.1 %
Patient temperature: 98.6
pCO2 arterial: 71.4 mmHg (ref 32.0–48.0)
pH, Arterial: 7.326 — ABNORMAL LOW (ref 7.350–7.450)
pO2, Arterial: 71.6 mmHg — ABNORMAL LOW (ref 83.0–108.0)

## 2018-07-20 LAB — PULMONARY FUNCTION TEST
FEF 25-75 Pre: 0.13 L/sec
FEF2575-%Pred-Pre: 9 %
FEV1-%Pred-Pre: 20 %
FEV1-Pre: 0.4 L
FEV1FVC-%Pred-Pre: 62 %
FEV6-%Pred-Pre: 31 %
FEV6-Pre: 0.81 L
FEV6FVC-%Pred-Pre: 96 %
FVC-%Pred-Pre: 33 %
FVC-Pre: 0.89 L
Pre FEV1/FVC ratio: 45 %
Pre FEV6/FVC Ratio: 91 %

## 2018-07-20 LAB — BASIC METABOLIC PANEL
Anion gap: 8 (ref 5–15)
BUN: 18 mg/dL (ref 8–23)
CO2: 34 mmol/L — ABNORMAL HIGH (ref 22–32)
Calcium: 9 mg/dL (ref 8.9–10.3)
Chloride: 94 mmol/L — ABNORMAL LOW (ref 98–111)
Creatinine, Ser: 0.82 mg/dL (ref 0.44–1.00)
Glucose, Bld: 96 mg/dL (ref 70–99)
Potassium: 4.4 mmol/L (ref 3.5–5.1)
Sodium: 136 mmol/L (ref 135–145)

## 2018-07-20 LAB — GLUCOSE, CAPILLARY
Glucose-Capillary: 88 mg/dL (ref 70–99)
Glucose-Capillary: 90 mg/dL (ref 70–99)
Glucose-Capillary: 84 mg/dL (ref 70–99)
Glucose-Capillary: 100 mg/dL — ABNORMAL HIGH (ref 70–99)

## 2018-07-20 LAB — CBC
HCT: 35.1 % — ABNORMAL LOW (ref 36.0–46.0)
Hemoglobin: 10.7 g/dL — ABNORMAL LOW (ref 12.0–15.0)
MCH: 30.9 pg (ref 26.0–34.0)
MCHC: 30.5 g/dL (ref 30.0–36.0)
MCV: 101.4 fL — ABNORMAL HIGH (ref 80.0–100.0)
Platelets: 225 10*3/uL (ref 150–400)
RBC: 3.46 MIL/uL — ABNORMAL LOW (ref 3.87–5.11)
RDW: 13.1 % (ref 11.5–15.5)
WBC: 6.4 10*3/uL (ref 4.0–10.5)
nRBC: 0 % (ref 0.0–0.2)

## 2018-07-20 LAB — RESPIRATORY PANEL BY PCR

## 2018-07-20 NOTE — Progress Notes (Addendum)
NAME:  Charlotte Harris, MRN:  [id number], DOB:  1931-12-30, LOS: 2 ADMISSION DATE:  07/18/2018, CONSULTATION DATE:  07/19/18 REFERRING MD:  Dr. Wynelle Cleveland / [facility], CHIEF COMPLAINT:  Dyspnea, Hypoxia    Brief History   84 y/o F with PMH of adenocarcinoma of the lung s/p XRT to RUL (released from Dr. Ida Rogue care in 2017) who presented to [facility] on 12/17 with SOB.  CTA of the chest on admit was negative for PE but showed an interval increase in size of right consolidative mass adjacent to a pathologic rib fracture.  PCCM consulted for evaluation of abnormal CT & dyspnea.   Past Medical History  RUL Adenocarcinoma - s/p XRT, released from Dr. Lisbeth Renshaw in 2017 COPD  HTN Breast Cancer - right mastectomy  Significant Hospital Events   12/16  Admit with dyspnea, found to be hypoxic  Consults:  PCCM 12/17   Procedures:    Significant Diagnostic Tests:  CTA Chest 12/16 >> neg for PE, interval increase in size of right consolidative mass adjacent to a pathologic rib fracture.  Favor progressive inflammatory process vs indolent carcinoma.  Adjacent angular RUL nodule slightly increased in thickness since 08/2015.   ECHO 12/17 >> LVEF 65-70%, grade 1 DD, pulmonary artery normal sized, RA normal sized, mild TR  Micro Data:     Antimicrobials:    Interim history/subjective:  A little winded with walking today. O2 sats confirm she was hypoxemic during this. No complaints otherwise.   Objective   Blood pressure (!) 127/54, pulse (!) 47, temperature 97.7 F (36.5 C), temperature source Oral, resp. rate 18, height 5\' 5"  (1.651 m), weight 47.5 kg, SpO2 92 %.        Intake/Output Summary (Last 24 hours) at 07/20/2018 1519 Last data filed at 07/20/2018 1358 Gross per 24 hour  Intake 360 ml  Output -  Net 360 ml   Filed Weights   07/18/18 1527 07/19/18 0848  Weight: 44.9 kg 47.5 kg    Examination: General:  Thin, elderly female in NAD HEENT: [place]/AT, PERRL, no JVD Neuro: Alert, oriented,  non-focal. Energetic.  CV: RRR, no MRG PULM: Even/ unlabored. No distress. No wheeze.  LO:VFIE, non-tender, bsx4 active  Extremities: warm/dry, no edema  Skin: no rashes or lesions  Resolved Hospital Problem list      Assessment & Plan:   Acute Hypoxic Respiratory Failure  -suspect multifactorial in setting of suspected COPD, hx of RUL lung cancer - This may just be a new finding of a pre-existing condition.  P: Wean O2 for sats 88-95% (Desat to 78% on 2L ambulating today)   COPD: unclear if having acute exacerbation.  -no PFT's  -little symptom burden per patient hx P: Discharge home med plan: Spiriva, PRN albuterol Will need outpatient PFT's at some point.  Would prefer not to take steroids and says she will refuse, but she may benefit from a short course to see if she improves. Attending to follow.   RUL Lung Mass  -hx RUL adenocarcinoma s/p XRT  -concern for enlarging mass vs inflammatory process on CT imaging  P: Outpatient follow up with Dr. Lisbeth Renshaw at discharge Repeat PET-CT to evaluate RUL mass after antibiotics complete.  No infectious prodrome by history.   Pulmonary follow up arranged.    Best practice:  Diet: Heart healthy, carb modified  Pain/Anxiety/Delirium protocol (if indicated): n/a VAP protocol (if indicated): n/a DVT prophylaxis: Lovenox  GI prophylaxis: n/a Glucose control: per primary  Mobility: as tolerated  Code  Status: Full  Family Communication: Patient updated on plan of care  Disposition: Per primary  Labs   CBC: Recent Labs  Lab 07/18/18 1657 07/18/18 1706 07/19/18 0646 07/20/18 0241  WBC 7.2  --  5.6 6.4  NEUTROABS 5.3  --  4.5  --   HGB 11.3* 12.9 10.9* 10.7*  HCT 38.9 38.0 36.0 35.1*  MCV 102.4*  --  101.4* 101.4*  PLT 232  --  200 017    Basic Metabolic Panel: Recent Labs  Lab 07/18/18 1657 07/18/18 1706 07/19/18 0646 07/20/18 0241  NA 141 139 133* 136  K 4.1 4.0 4.0 4.4  CL 98 97* 92* 94*  CO2 35*  --  30 34*    GLUCOSE 97 90 89 96  BUN 18 21 17 18   CREATININE 0.71 0.70 0.77 0.82  CALCIUM 9.5  --  8.5* 9.0   GFR: Estimated Creatinine Clearance: 38.3 mL/min (by C-G formula based on SCr of 0.82 mg/dL). Recent Labs  Lab 07/18/18 1657 07/19/18 0646 07/19/18 2005 07/20/18 0241  PROCALCITON  --   --  <0.10  --   WBC 7.2 5.6  --  6.4    Liver Function Tests: Recent Labs  Lab 07/19/18 0646  AST 17  ALT 12  ALKPHOS 41  BILITOT 0.8  PROT 5.6*  ALBUMIN 3.3*   No results for input(s): LIPASE, AMYLASE in the last 168 hours. No results for input(s): AMMONIA in the last 168 hours.  ABG    Component Value Date/Time   PHART 7.326 (L) 07/20/2018 0416   PCO2ART 71.4 (HH) 07/20/2018 0416   PO2ART 71.6 (L) 07/20/2018 0416   HCO3 36.3 (H) 07/20/2018 0416   TCO2 37 (H) 07/18/2018 1706   O2SAT 94.1 07/20/2018 0416     Coagulation Profile: No results for input(s): INR, PROTIME in the last 168 hours.  Cardiac Enzymes: Recent Labs  Lab 07/19/18 0646  TROPONINI <0.03    HbA1C: Hemoglobin A1C  Date/Time Value Ref Range Status  01/31/2018 09:54 AM 6.0 (A) 4.0 - 5.6 % Final  07/21/2017 11:10 AM 6.0  Final   Hgb A1c MFr Bld  Date/Time Value Ref Range Status  01/25/2017 11:26 AM 6.1 4.6 - 6.5 % Final    Comment:    Glycemic Control Guidelines for People with Diabetes:Non Diabetic:  <6%Goal of Therapy: <7%Additional Action Suggested:  >8%   11/15/2014 12:02 PM 6.0 4.6 - 6.5 % Final    Comment:    Glycemic Control Guidelines for People with Diabetes:Non Diabetic:  <6%Goal of Therapy: <7%Additional Action Suggested:  >8%     CBG: Recent Labs  Lab 07/19/18 1214 07/19/18 1640 07/19/18 2153 07/20/18 0745 07/20/18 1216  GLUCAP 163* 98 90 88 90     Past Medical History  She,  has a past medical history of ADENOCARCINOMA, LUNG (07/03/2010), ASYMPTOMATIC POSTMENOPAUSAL STATUS (07/02/2009), Breast cancer ([place]) (1991), COLONIC POLYPS, HX OF (04/16/2007), COPD (05/17/2008), HEMATURIA, HX  OF (04/16/2007), History of radiation therapy (07/30/09 to 08/09/09), HYPERLIPIDEMIA (04/16/2007), Hypertension, HYPERTENSION (04/16/2007), HYPOTHYROIDISM, POST-RADIATION (06/07/2008), Osteoarthritis, OSTEOPOROSIS (07/02/2009), and Wrist fracture, left.   Surgical History    Past Surgical History:  Procedure Laterality Date  . BREAST SURGERY     lumpectomy  . EYE SURGERY Bilateral    cataracts  . 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 LUNG BIOPSY  05/22/2009   RUL lung=Adenocarcinoma  . MASTECTOMY  1991   right  . TONSILLECTOMY  1955  Social History   reports that she quit smoking about 8 years ago. Her smoking use included cigarettes. She has a 50.00 pack-year smoking history. She has never used smokeless tobacco. She reports current alcohol use of about 2.0 standard drinks of alcohol per week. She reports that she does not use drugs.   Family History   Her family history includes Cancer in her brother, brother, and sister; Heart attack (age of onset: 22) in her mother; Heart disease in her father, mother, sister, and another family member; Hypertension in an other family member. There is no history of Colon cancer or Stomach cancer.   Allergies No Known Allergies   Home Medications  Prior to Admission medications   Medication Sig Start Date End Date Taking? Authorizing Provider  acetaminophen (TYLENOL) 500 MG tablet Take 500 mg by mouth every 6 (six) hours as needed for headache (pain).    Yes [provider]  Albuterol Sulfate 108 (90 Base) MCG/ACT AEPB Inhale 1 puff into the lungs 4 (four) times daily as needed. Patient taking differently: Inhale 1 puff into the lungs 4 (four) times daily as needed (shortness of breath/ wheezing).  06/17/18  Yes Caren Macadam, MD  aspirin EC 81 MG tablet Take 81 mg by mouth at bedtime.   Yes [provider]  Calcium Carb-Cholecalciferol (CALCIUM 1000 + D PO) Take 2 tablets by mouth daily.   Yes  [provider]  levothyroxine (SYNTHROID, LEVOTHROID) 50 MCG tablet TAKE 1 TABLET BY MOUTH EVERY DAY Patient taking differently: Take 50 mcg by mouth daily before breakfast.  05/19/18  Yes Renato Shin, MD  losartan (COZAAR) 100 MG tablet TAKE 1 TABLET (100 MG TOTAL) BY MOUTH DAILY. Patient taking differently: Take 100 mg by mouth daily.  07/11/18  Yes Renato Shin, MD  metFORMIN (GLUCOPHAGE-XR) 500 MG 24 hr tablet Take 1 tablet (500 mg total) by mouth daily. Patient taking differently: Take 500 mg by mouth daily after supper.  07/22/17  Yes Renato Shin, MD  metoprolol succinate (TOPROL-XL) 25 MG 24 hr tablet Take 1 tablet (25 mg total) by mouth daily. Patient taking differently: Take 25 mg by mouth daily after supper.  01/31/18  Yes Renato Shin, MD  NIFEdipine (PROCARDIA-XL/NIFEDICAL-XL) 30 MG 24 hr tablet TAKE 1 TABLET BY MOUTH EVERY MORNING Patient taking differently: Take 30 mg by mouth daily.  07/11/18  Yes Renato Shin, MD  SPIRIVA HANDIHALER 18 MCG inhalation capsule INHALE 1 CAPSULE VIA HANDIHALER ONCE DAILY AT [place] Patient taking differently: Place 18 mcg into inhaler and inhale daily.  01/09/18  Yes Renato Shin, MD  Spacer/Aero-Holding Chambers (E-Z SPACER) inhaler Use as instructed 06/17/18   Caren Macadam, MD          Georgann Housekeeper, AGACNP-BC [place] Pager [phone] or [phone]  07/20/2018 4:06 PM  Attending Note:  84 year old female with COPD who presents to PCCM with new O2 need, COPD and a lung mass.  On exam, lungs with decreased BS diffusely.  I reviewed chest CT myself, RUL mass noted and emphysema.  Discussed with PCCM-NP.  COPD:  - Need PFTs as outpatient  - Spiriva  - Albuterol  - No need for steroids at this time, specially that patient is refusing to take them.  Hypoxemia:  - Titrate O2 for sat of 88-92%  - Will need an ambulatory desat study prior to discharge for home O2  Lung  mass:  - Hold bronch for now  PCCM will sign off, please call back if needed.  Patient seen and examined, agree with above note.  I dictated the care and orders written for this patient under my direction.  Rush Farmer, [place]

## 2018-07-20 NOTE — Progress Notes (Signed)
SATURATION QUALIFICATIONS:  Patient Saturations on Room Air at Rest = 88%  Patient Saturations on Room Air while Ambulating = 72%  Patient Saturations on 3  Liters of oxygen while Ambulating =92 %

## 2018-07-20 NOTE — Progress Notes (Signed)
PROGRESS NOTE    TIKESHA MORT  ZOX:[id number] DOB: 11-09-1931 DOA: 07/18/2018 PCP: Caren Macadam, MD   Brief Narrative:  84 year old with history of COPD, essential hypertension, diabetes mellitus type 2,, history of previous lung cancer and breast cancer followed by Dr. Ellene Route came to the hospital complains of shortness of breath.  Initially she was quite hypoxic requiring 4 L nasal cannula.  CT of the chest showed worsening consolidation concerning for mass versus indolent carcinoma.   Assessment & Plan:   Principal Problem:   Acute on chronic respiratory failure with hypoxia (HCC) Active Problems:   Hypothyroidism following radioiodine therapy   Hyperlipidemia associated with type 2 diabetes mellitus (HCC)   HTN (hypertension)   COPD (chronic obstructive pulmonary disease) (HCC)   BREAST CANCER, HX OF   Normocytic normochromic anemia   Diabetes mellitus type 2 in nonobese (HCC)   Mass of upper lobe of left lung   Acute hypoxic respiratory failure requiring 2 L nasal cannula -Ambulatory pulse ox this morning showed desaturation to 78% and at room air on rest she is 88%. - Procalcitonin level is negative, discontinue antibiotics - Respiratory panel was negative, discontinue precaution. -Pulmonary is following the patient.  She is now wheezing therefore we will hold off on steroids - Adjustment for bronchodilator therapy per pulmonary - Admission echocardiogram showed ejection fraction 65% with grade 1 diastolic dysfunction  Right upper lobe lesion History of breast cancer - Follow-up outpatient, will require PET scan.  Follow-up outpatient with oncology.  Hypothyroidism secondary to radioiodine therapy -Continue Synthroid  Diabetes mellitus type 2 -Insulin sliding scale and Accu-Chek.  Holding metformin  Essential hypertension -Resume home meds-losartan, Toprol, nifedipine.  IV hydralazine as needed  Anemia of chronic disease -Hemoglobin stable, no active  signs of bleeding   DVT prophylaxis: Lovenox Code Status: Full code Family Communication: None at bedside Disposition Plan: Maintain hospital stay until she is able to be weaned off oxygen.  Consultants:   Pulmonary  Procedures:   None  Antimicrobials:   Doxycycline-discontinue today   Subjective: Patient states she symptomatically feels better but up on ambulation she gets significantly hypoxic.  Review of Systems Otherwise negative except as per HPI, including: General: Denies fever, chills, night sweats or unintended weight loss. Resp: Denies cough Cardiac: Denies chest pain, palpitations, orthopnea, paroxysmal nocturnal dyspnea. GI: Denies abdominal pain, nausea, vomiting, diarrhea or constipation GU: Denies dysuria, frequency, hesitancy or incontinence MS: Denies muscle aches, joint pain or swelling Neuro: Denies headache, neurologic deficits (focal weakness, numbness, tingling), abnormal gait Psych: Denies anxiety, depression, SI/HI/AVH Skin: Denies new rashes or lesions ID: Denies sick contacts, exotic exposures, travel  Objective: Vitals:   07/19/18 1639 07/19/18 2157 07/20/18 0749 07/20/18 0909  BP: 107/63 127/74  (!) 127/54  Pulse: 94 90  (!) 47  Resp: 18 18  18   Temp: 97.8 F (36.6 C) 99.7 F (37.6 C)  97.7 F (36.5 C)  TempSrc:  Oral    SpO2: 96% 97% 96% 92%  Weight:      Height:       No intake or output data in the 24 hours ending 07/20/18 1007 Filed Weights   07/18/18 1527 07/19/18 0848  Weight: 44.9 kg 47.5 kg    Examination:  General exam: Appears calm and comfortable  Respiratory system: Slight coarse breath sounds. Cardiovascular system: S1 & S2 heard, RRR. No JVD, murmurs, rubs, gallops or clicks. No pedal edema. Gastrointestinal system: Abdomen is nondistended, soft and nontender. No organomegaly or masses  felt. Normal bowel sounds heard. Central nervous system: Alert and oriented. No focal neurological deficits. Extremities:  Symmetric 5 x 5 power. Skin: No rashes, lesions or ulcers Psychiatry: Judgement and insight appear normal. Mood & affect appropriate.     Data Reviewed:   CBC: Recent Labs  Lab 07/18/18 1657 07/18/18 1706 07/19/18 0646 07/20/18 0241  WBC 7.2  --  5.6 6.4  NEUTROABS 5.3  --  4.5  --   HGB 11.3* 12.9 10.9* 10.7*  HCT 38.9 38.0 36.0 35.1*  MCV 102.4*  --  101.4* 101.4*  PLT 232  --  200 938   Basic Metabolic Panel: Recent Labs  Lab 07/18/18 1657 07/18/18 1706 07/19/18 0646 07/20/18 0241  NA 141 139 133* 136  K 4.1 4.0 4.0 4.4  CL 98 97* 92* 94*  CO2 35*  --  30 34*  GLUCOSE 97 90 89 96  BUN 18 21 17 18   CREATININE 0.71 0.70 0.77 0.82  CALCIUM 9.5  --  8.5* 9.0   GFR: Estimated Creatinine Clearance: 38.3 mL/min (by C-G formula based on SCr of 0.82 mg/dL). Liver Function Tests: Recent Labs  Lab 07/19/18 0646  AST 17  ALT 12  ALKPHOS 41  BILITOT 0.8  PROT 5.6*  ALBUMIN 3.3*   No results for input(s): LIPASE, AMYLASE in the last 168 hours. No results for input(s): AMMONIA in the last 168 hours. Coagulation Profile: No results for input(s): INR, PROTIME in the last 168 hours. Cardiac Enzymes: Recent Labs  Lab 07/19/18 0646  TROPONINI <0.03   BNP (last 3 results) No results for input(s): PROBNP in the last 8760 hours. HbA1C: No results for input(s): HGBA1C in the last 72 hours. CBG: Recent Labs  Lab 07/19/18 0859 07/19/18 1214 07/19/18 1640 07/19/18 2153 07/20/18 0745  GLUCAP 159* 163* 98 90 88   Lipid Profile: No results for input(s): CHOL, HDL, LDLCALC, TRIG, CHOLHDL, LDLDIRECT in the last 72 hours. Thyroid Function Tests: No results for input(s): TSH, T4TOTAL, FREET4, T3FREE, THYROIDAB in the last 72 hours. Anemia Panel: No results for input(s): VITAMINB12, FOLATE, FERRITIN, TIBC, IRON, RETICCTPCT in the last 72 hours. Sepsis Labs: Recent Labs  Lab 07/19/18 2005  PROCALCITON <0.10    Recent Results (from the past 240 hour(s))    Respiratory Panel by PCR     Status: None   Collection Time: 07/19/18  9:18 PM  Result Value Ref Range Status   Adenovirus NOT DETECTED NOT DETECTED Final   Coronavirus 229E NOT DETECTED NOT DETECTED Final   Coronavirus HKU1 NOT DETECTED NOT DETECTED Final   Coronavirus NL63 NOT DETECTED NOT DETECTED Final   Coronavirus OC43 NOT DETECTED NOT DETECTED Final   Metapneumovirus NOT DETECTED NOT DETECTED Final   Rhinovirus / Enterovirus NOT DETECTED NOT DETECTED Final   Influenza A NOT DETECTED NOT DETECTED Final   Influenza B NOT DETECTED NOT DETECTED Final   Parainfluenza Virus 1 NOT DETECTED NOT DETECTED Final   Parainfluenza Virus 2 NOT DETECTED NOT DETECTED Final   Parainfluenza Virus 3 NOT DETECTED NOT DETECTED Final   Parainfluenza Virus 4 NOT DETECTED NOT DETECTED Final   Respiratory Syncytial Virus NOT DETECTED NOT DETECTED Final   Bordetella pertussis NOT DETECTED NOT DETECTED Final   Chlamydophila pneumoniae NOT DETECTED NOT DETECTED Final   Mycoplasma pneumoniae NOT DETECTED NOT DETECTED Final    Comment: Performed at [facility], [place] [address], [place], [place] [address]         Radiology Studies: Ct  Angio Chest Pe W And/or Wo Contrast  Result Date: 07/18/2018 CLINICAL DATA:  Short of breath, chest pain. EXAM: CT ANGIOGRAPHY CHEST WITH CONTRAST TECHNIQUE: Multidetector CT imaging of the chest was performed using the standard protocol during bolus administration of intravenous contrast. Multiplanar CT image reconstructions and MIPs were obtained to evaluate the vascular anatomy. CONTRAST:  6mL ISOVUE-370 IOPAMIDOL (ISOVUE-370) INJECTION 76% COMPARISON:  CT 09/02/2015 FINDINGS: Cardiovascular: No filling defects within the pulmonary arteries to suggest acute pulmonary embolism. No acute findings of the aorta or great vessels. No pericardial fluid. Mediastinum/Nodes: No axillary or supraclavicular adenopathy. No mediastinal hilar adenopathy. No pericardial effusion.  Esophagus normal Lungs/Pleura: Peripheral consolidation in the RIGHT upper lobe adjacent to a pathologic rib fracture measures 28 x 20 mm increased from 25 x 22 mm. Lesion is same shape as comparison exam although more thickened. Angular nodule in the RIGHT upper lobe adjacent to the consolidation measures 8 mm x 16 mm compared to 22 x 11. This nodule also appears slightly thickened compared to prior. No new pulmonary nodules. Centrilobular emphysema the upper lobes. Upper Abdomen: Limited view of the liver, kidneys, pancreas are unremarkable. Normal adrenal glands. Musculoskeletal: Scoliosis of the spine. Stable pathologic rib fracture anteriorly on the RIGHT second rib. Review of the MIP images confirms the above findings. IMPRESSION: 1. No evidence acute pulmonary embolism. 2. No acute finding of the aorta or great vessels. 3. Interval increase in size of RIGHT consolidative mass adjacent to a pathologic rib fracture. Favor progressive inflammatory process versus indolent carcinoma. 4. Adjacent angular RIGHT upper lobe nodule also slightly increased in thickness from 09/02/2015. Favored benign inflammatory thickening. Electronically Signed   By: Suzy Bouchard M.D.   On: 07/18/2018 18:59   Dg Chest Portable 1 View  Result Date: 07/18/2018 CLINICAL DATA:  Chest pain EXAM: PORTABLE CHEST 1 VIEW COMPARISON:  None. FINDINGS: The lungs are hyperinflated likely secondary to COPD. There is blunting of the left costophrenic angle likely reflecting a trace pleural effusion versus chronic pleural thickening. There is a right upper lobe spiculated airspace opacity likely reflecting posttreatment changes with recurrent or residual neoplasm not excluded. There is no pneumothorax. There is stable cardiomegaly. There is thoracic aortic atherosclerosis. The osseous structures are unremarkable. IMPRESSION: Right upper lobe spiculated airspace opacity likely reflecting posttreatment changes with recurrent or residual  neoplasm not excluded. COPD. Electronically Signed   By: Kathreen Devoid   On: 07/18/2018 16:07        Scheduled Meds: . aspirin EC  81 mg Oral QHS  . enoxaparin (LOVENOX) injection  40 mg Subcutaneous Q24H  . insulin aspart  0-9 Units Subcutaneous TID WC  . levothyroxine  50 mcg Oral QAC breakfast  . losartan  100 mg Oral Daily  . metoprolol succinate  25 mg Oral QPC supper  . NIFEdipine  30 mg Oral Daily  . umeclidinium bromide  1 puff Inhalation Daily   Continuous Infusions:   LOS: 2 days   Time spent= 35 mins    Korra Christine Arsenio Loader, MD [facility] Pager [phone]   If 7PM-7AM, please contact night-coverage [email or web address] Password [email or web address] 07/20/2018, 10:07 AM

## 2018-07-20 NOTE — Progress Notes (Addendum)
CRITICAL VALUE ALERT  Critical Value:    pH:  7.326  CO2:  71.4  PO2:  71.6  Bi Carb:  36.3  Date & Time Notied:  07/20/2018 @ 3729  Provider Notified: E-Link @ [phone]  Orders Received/Actions taken: Awaiting instructions

## 2018-07-21 ENCOUNTER — Other Ambulatory Visit: Payer: Self-pay | Admitting: Family Medicine

## 2018-07-21 ENCOUNTER — Ambulatory Visit: Payer: Self-pay

## 2018-07-21 ENCOUNTER — Other Ambulatory Visit: Payer: Self-pay

## 2018-07-21 DIAGNOSIS — J449 Chronic obstructive pulmonary disease, unspecified: Secondary | ICD-10-CM

## 2018-07-21 LAB — GLUCOSE, CAPILLARY
Glucose-Capillary: 76 mg/dL (ref 70–99)
Glucose-Capillary: 91 mg/dL (ref 70–99)

## 2018-07-21 MED ORDER — IPRATROPIUM-ALBUTEROL 0.5-2.5 (3) MG/3ML IN SOLN: 3.0000 mL | RESPIRATORY_TRACT | 0 refills | Status: DC | PRN

## 2018-07-21 MED ORDER — BUDESONIDE 0.25 MG/2ML IN SUSP: 0.2500 mg | Freq: Two times a day (BID) | RESPIRATORY_TRACT | 0 refills | Status: DC

## 2018-07-21 MED ORDER — PREDNISONE 50 MG PO TABS: 50.0000 mg | ORAL_TABLET | Freq: Every day | ORAL | 0 refills | Status: AC

## 2018-07-21 MED ORDER — NEBULIZER COMPRESSOR KIT: PACK | 0 refills | Status: DC

## 2018-07-21 NOTE — Care Management Important Message (Signed)
Important Message  Patient Details  Name: Charlotte Harris MRN: [id number] Date of Birth: Jan 31, 1932   Medicare Important Message Given:  Yes    Orbie Pyo 07/21/2018, 2:13 PM

## 2018-07-21 NOTE — Addendum Note (Signed)
Addended by: Beckie Busing on: 07/21/2018 05:23 PM   Modules accepted: Orders

## 2018-07-21 NOTE — Discharge Summary (Signed)
Physician Discharge Summary  Charlotte Harris BJS:[id number] DOB: [birth year] DOA: 07/18/2018  PCP: Caren Macadam, MD  Admit date: 07/18/2018 Discharge date: 07/21/2018  Admitted From: Home Disposition: Home with home oxygen  Recommendations for Outpatient Follow-up:  1. Follow up with PCP in 1-2 weeks 2. Please obtain BMP/CBC in one week your next doctors visit.  3. Given 5 days of oral prednisone 4. Home oxygen arranged 2-3 L at rest and with ambulation respectively 5. Follow-up outpatient with pulmonary for PFTs  Home Health: None Equipment/Devices: Home oxygen Discharge Condition: Stable CODE STATUS: Full code Diet recommendation: Diabetic  Brief/Interim Summary: 84 year old with history of COPD, essential hypertension, diabetes mellitus type 2,, history of previous lung cancer and breast cancer followed by Dr. Ellene Route came to the hospital complains of shortness of breath.  Initially she was quite hypoxic requiring 4 L nasal cannula.  CT of the chest showed worsening consolidation concerning for mass versus indolent carcinoma.  During the hospitalization her respiratory panel was negative, procalcitonin level negative therefore antibiotics were discontinued.  She was seen by pulmonary as well.  Echocardiogram showed ejection fraction 65% with grade 1 diastolic dysfunction.  Over the course of several days her breathing symptoms significantly improved but still remained hypoxic with ambulation in the hallway desaturating to 78% on room air.  Patient was adamant about being discharged and wanted home oxygen.  She was explained risk and benefit about being discharged too soon as it is not necessarily safe.  She understood the risks and benefit and still wanted to go home. At this time we will discharge the patient at her request.   Discharge Diagnoses:  Principal Problem:   Acute on chronic respiratory failure with hypoxia ([place]) Active Problems:   Hypothyroidism following  radioiodine therapy   Hyperlipidemia associated with type 2 diabetes mellitus (HCC)   HTN (hypertension)   COPD (chronic obstructive pulmonary disease) (HCC)   BREAST CANCER, HX OF   Normocytic normochromic anemia   Diabetes mellitus type 2 in nonobese (HCC)   Mass of upper lobe of left lung   Hypoxemia   Lung mass  Acute hypoxic respiratory failure requiring 2 L nasal cannula -Ambulatory pulse ox- still hypoxic at 77% with ambulation with correction and to 90% on 2 L nasal cannula - Procalcitonin level is negative, discontinue antibiotics - Respiratory panel was negative, discontinue precaution. -Appreciate pulmonary input-Will need outpatient follow-up -We will discharge on 5 days of oral prednisone - Adjustment for bronchodilator therapy per pulmonary - Admission echocardiogram showed ejection fraction 65% with grade 1 diastolic dysfunction  Patient understands the risk and benefit of being discharged too early.  She is adamant about being discharged despite being hypoxic.  I advised her that I am not comfortable discharging her as this is too soon but she still insisted therefore arrangements were made for home oxygen.  We will also make home health arrangements for her  Right upper lobe lesion History of breast cancer - Follow-up outpatient, will require PET scan.  Follow-up outpatient with oncology.  Hypothyroidism secondary to radioiodine therapy -Continue Synthroid  Diabetes mellitus type 2 -Insulin sliding scale and Accu-Chek.  Holding metformin  Essential hypertension -Resume home meds-losartan, Toprol, nifedipine.  IV hydralazine as needed  Anemia of chronic disease -Hemoglobin stable, no active signs of bleeding  She was on Lovenox while here Discharge today patient's request She is a full code  Discharge Instructions   Allergies as of 07/21/2018   No Known Allergies     Medication  List    TAKE these medications   acetaminophen 500 MG  tablet Commonly known as:  TYLENOL Take 500 mg by mouth every 6 (six) hours as needed for headache (pain).   Albuterol Sulfate 108 (90 Base) MCG/ACT Aepb Inhale 1 puff into the lungs 4 (four) times daily as needed. What changed:  reasons to take this   aspirin EC 81 MG tablet Take 81 mg by mouth at bedtime.   budesonide 0.25 MG/2ML nebulizer solution Commonly known as:  PULMICORT Take 2 mLs (0.25 mg total) by nebulization 2 (two) times daily.   CALCIUM 1000 + D PO Take 2 tablets by mouth daily.   E-Z SPACER inhaler Use as instructed   ipratropium-albuterol 0.5-2.5 (3) MG/3ML Soln Commonly known as:  DUONEB Take 3 mLs by nebulization every 4 (four) hours as needed.   levothyroxine 50 MCG tablet Commonly known as:  SYNTHROID, LEVOTHROID TAKE 1 TABLET BY MOUTH EVERY DAY What changed:  when to take this   losartan 100 MG tablet Commonly known as:  COZAAR TAKE 1 TABLET (100 MG TOTAL) BY MOUTH DAILY. What changed:  See the new instructions.   metFORMIN 500 MG 24 hr tablet Commonly known as:  GLUCOPHAGE-XR Take 1 tablet (500 mg total) by mouth daily. What changed:  when to take this   metoprolol succinate 25 MG 24 hr tablet Commonly known as:  TOPROL-XL Take 1 tablet (25 mg total) by mouth daily. What changed:  when to take this   NIFEdipine 30 MG 24 hr tablet Commonly known as:  PROCARDIA-XL/NIFEDICAL-XL TAKE 1 TABLET BY MOUTH EVERY MORNING What changed:  when to take this   predniSONE 50 MG tablet Commonly known as:  DELTASONE Take 1 tablet (50 mg total) by mouth daily with breakfast for 5 days.   SPIRIVA HANDIHALER 18 MCG inhalation capsule Generic drug:  tiotropium INHALE 1 CAPSULE VIA HANDIHALER ONCE DAILY AT THE SAME TIME EVERY DAY What changed:  See the new instructions.            Durable Medical Equipment  (From admission, onward)         Start     Ordered   07/21/18 0855  For home use only DME oxygen  Once    Comments:  Chronic Asthma   Question Answer Comment  Mode or (Route) Nasal cannula   Liters per Minute 3   Frequency Continuous (stationary and portable oxygen unit needed)   Oxygen delivery system Gas      07/21/18 0854         Follow-up Information    Lauraine Rinne, NP Follow up on 07/29/2018.   Specialty:  Pulmonary Disease Why:  Appt at 11:30. Please arrive at 11:15 for check in.   Contact information: [address] [place] [address] [phone]        Caren Macadam, MD. Schedule an appointment as soon as possible for a visit in 1 week(s).   Specialty:  Family Medicine Contact information: [place] [address] [place] Follow up.   Why:  oxygen Contact information: [place] [address] [facility] Follow up.   Specialty:  [place] Why:  [facility] Contact information: [place] [address] [phone]          No Known Allergies  You were cared for by a hospitalist during your hospital stay. If you have any questions about your discharge medications or the care you received while you were in the hospital after you are discharged, you can call the unit and asked to speak with the hospitalist on call if the hospitalist that took care of you is not available. Once you are discharged, your primary care physician will handle any further medical issues. Please note that no refills for any discharge medications will be authorized once you are discharged, as it is imperative that you return to your primary care physician (or establish a relationship with a primary care physician if you do not have one) for your aftercare needs so that they can reassess your need for medications and monitor your lab values.  Consultations:  Pulmonary   Procedures/Studies: Ct Angio Chest Pe W And/or Wo Contrast  Result  Date: 07/18/2018 CLINICAL DATA:  Short of breath, chest pain. EXAM: CT ANGIOGRAPHY CHEST WITH CONTRAST TECHNIQUE: Multidetector CT imaging of the chest was performed using the standard protocol during bolus administration of intravenous contrast. Multiplanar CT image reconstructions and MIPs were obtained to evaluate the vascular anatomy. CONTRAST:  19mL ISOVUE-370 IOPAMIDOL (ISOVUE-370) INJECTION 76% COMPARISON:  CT 09/02/2015 FINDINGS: Cardiovascular: No filling defects within the pulmonary arteries to suggest acute pulmonary embolism. No acute findings of the aorta or great vessels. No pericardial fluid. Mediastinum/Nodes: No axillary or supraclavicular adenopathy. No mediastinal hilar adenopathy. No pericardial effusion. Esophagus normal Lungs/Pleura: Peripheral consolidation in the RIGHT upper lobe adjacent to a pathologic rib fracture measures 28 x 20 mm increased from 25 x 22 mm. Lesion is same shape as comparison exam although more thickened. Angular nodule in the RIGHT upper lobe adjacent to the consolidation measures 8 mm x 16 mm compared to 22 x 11. This nodule also appears slightly thickened compared to prior. No new pulmonary nodules. Centrilobular emphysema the upper lobes. Upper Abdomen: Limited view of the liver, kidneys, pancreas are unremarkable. Normal adrenal glands. Musculoskeletal: Scoliosis of the spine. Stable pathologic rib fracture anteriorly on the RIGHT second rib. Review of the MIP images confirms the above findings. IMPRESSION: 1. No evidence acute pulmonary embolism. 2. No acute finding of the aorta or great vessels. 3. Interval increase in size of RIGHT consolidative mass adjacent to a pathologic rib fracture. Favor progressive inflammatory process versus indolent carcinoma. 4. Adjacent angular RIGHT upper lobe nodule also slightly increased in thickness from 09/02/2015. Favored benign inflammatory thickening. Electronically Signed   By: Suzy Bouchard M.D.   On: 07/18/2018 18:59    Dg Chest Portable 1 View  Result Date: 07/18/2018 CLINICAL DATA:  Chest pain EXAM: PORTABLE CHEST 1 VIEW COMPARISON:  None. FINDINGS: The lungs are hyperinflated likely secondary to COPD. There is blunting of the left costophrenic angle likely reflecting a trace pleural effusion versus chronic pleural thickening. There is a right upper lobe spiculated airspace opacity likely reflecting posttreatment changes with recurrent or residual neoplasm not excluded. There is no pneumothorax. There is stable cardiomegaly. There is thoracic aortic atherosclerosis. The osseous structures are unremarkable. IMPRESSION: Right upper lobe spiculated airspace opacity likely reflecting posttreatment changes with recurrent or residual neoplasm not excluded. COPD. Electronically Signed   By: Kathreen Devoid   On: 07/18/2018 16:07      Subjective: Hypoxic to 77% on ambulation on room air.  Quick recovery to 90% on 2 L nasal cannula.  Patient still adamant about being discharged home with home oxygen.   Discharge Exam: Vitals:  07/21/18 0806 07/21/18 0928  BP: 125/69   Pulse: 98   Resp: 16   Temp: 98.6 F (37 C)   SpO2: 90% 97%   Vitals:   07/20/18 1744 07/20/18 2357 07/21/18 0806 07/21/18 0928  BP: 133/69 (!) 143/80 125/69   Pulse: 94 (!) 104 98   Resp: 18 20 16    Temp: 98.2 F (36.8 C) 100.2 F (37.9 C) 98.6 F (37 C)   TempSrc: Oral Oral Oral   SpO2: 99% 99% 90% 97%  Weight:      Height:        General: Pt is alert, awake, not in acute distress Cardiovascular: RRR, S1/S2 +, no rubs, no gallops Respiratory: Diffuse diminished breath sounds Abdominal: Soft, NT, ND, bowel sounds + Extremities: no edema, no cyanosis    The results of significant diagnostics from this hospitalization (including imaging, microbiology, ancillary and laboratory) are listed below for reference.     Microbiology: Recent Results (from the past 240 hour(s))  Respiratory Panel by PCR     Status: None   Collection  Time: 07/19/18  9:18 PM  Result Value Ref Range Status   Adenovirus NOT DETECTED NOT DETECTED Final   Coronavirus 229E NOT DETECTED NOT DETECTED Final   Coronavirus HKU1 NOT DETECTED NOT DETECTED Final   Coronavirus NL63 NOT DETECTED NOT DETECTED Final   Coronavirus OC43 NOT DETECTED NOT DETECTED Final   Metapneumovirus NOT DETECTED NOT DETECTED Final   Rhinovirus / Enterovirus NOT DETECTED NOT DETECTED Final   Influenza A NOT DETECTED NOT DETECTED Final   Influenza B NOT DETECTED NOT DETECTED Final   Parainfluenza Virus 1 NOT DETECTED NOT DETECTED Final   Parainfluenza Virus 2 NOT DETECTED NOT DETECTED Final   Parainfluenza Virus 3 NOT DETECTED NOT DETECTED Final   Parainfluenza Virus 4 NOT DETECTED NOT DETECTED Final   Respiratory Syncytial Virus NOT DETECTED NOT DETECTED Final   Bordetella pertussis NOT DETECTED NOT DETECTED Final   Chlamydophila pneumoniae NOT DETECTED NOT DETECTED Final   Mycoplasma pneumoniae NOT DETECTED NOT DETECTED Final    Comment: Performed at [facility], [place]. [address], [place], [place] [address]     Labs: BNP (last 3 results) No results for input(s): BNP in the last 8760 hours. Basic Metabolic Panel: Recent Labs  Lab 07/18/18 1657 07/18/18 1706 07/19/18 0646 07/20/18 0241  NA 141 139 133* 136  K 4.1 4.0 4.0 4.4  CL 98 97* 92* 94*  CO2 35*  --  30 34*  GLUCOSE 97 90 89 96  BUN 18 21 17 18   CREATININE 0.71 0.70 0.77 0.82  CALCIUM 9.5  --  8.5* 9.0   Liver Function Tests: Recent Labs  Lab 07/19/18 0646  AST 17  ALT 12  ALKPHOS 41  BILITOT 0.8  PROT 5.6*  ALBUMIN 3.3*   No results for input(s): LIPASE, AMYLASE in the last 168 hours. No results for input(s): AMMONIA in the last 168 hours. CBC: Recent Labs  Lab 07/18/18 1657 07/18/18 1706 07/19/18 0646 07/20/18 0241  WBC 7.2  --  5.6 6.4  NEUTROABS 5.3  --  4.5  --   HGB 11.3* 12.9 10.9* 10.7*  HCT 38.9 38.0 36.0 35.1*  MCV 102.4*  --  101.4* 101.4*  PLT 232  --  200  225   Cardiac Enzymes: Recent Labs  Lab 07/19/18 0646  TROPONINI <0.03   BNP: Invalid input(s): POCBNP CBG: Recent Labs  Lab 07/20/18 1216 07/20/18 1658 07/20/18 2110 07/21/18 1224  07/21/18 1147  GLUCAP 90 84 100* 76 91   D-Dimer No results for input(s): DDIMER in the last 72 hours. Hgb A1c No results for input(s): HGBA1C in the last 72 hours. Lipid Profile No results for input(s): CHOL, HDL, LDLCALC, TRIG, CHOLHDL, LDLDIRECT in the last 72 hours. Thyroid function studies No results for input(s): TSH, T4TOTAL, T3FREE, THYROIDAB in the last 72 hours.  Invalid input(s): FREET3 Anemia work up No results for input(s): VITAMINB12, FOLATE, FERRITIN, TIBC, IRON, RETICCTPCT in the last 72 hours. Urinalysis    Component Value Date/Time   COLORURINE YELLOW 01/31/2018 1057   APPEARANCEUR CLEAR 01/31/2018 1057   LABSPEC 1.010 01/31/2018 1057   PHURINE 7.0 01/31/2018 1057   GLUCOSEU NEGATIVE 01/31/2018 1057   HGBUR NEGATIVE 01/31/2018 1057   BILIRUBINUR NEGATIVE 01/31/2018 1057   KETONESUR NEGATIVE 01/31/2018 1057   PROTEINUR NEG 07/05/2012 1409   UROBILINOGEN 0.2 01/31/2018 1057   NITRITE NEGATIVE 01/31/2018 1057   LEUKOCYTESUR NEGATIVE 01/31/2018 1057   Sepsis Labs Invalid input(s): PROCALCITONIN,  WBC,  LACTICIDVEN Microbiology Recent Results (from the past 240 hour(s))  Respiratory Panel by PCR     Status: None   Collection Time: 07/19/18  9:18 PM  Result Value Ref Range Status   Adenovirus NOT DETECTED NOT DETECTED Final   Coronavirus 229E NOT DETECTED NOT DETECTED Final   Coronavirus HKU1 NOT DETECTED NOT DETECTED Final   Coronavirus NL63 NOT DETECTED NOT DETECTED Final   Coronavirus OC43 NOT DETECTED NOT DETECTED Final   Metapneumovirus NOT DETECTED NOT DETECTED Final   Rhinovirus / Enterovirus NOT DETECTED NOT DETECTED Final   Influenza A NOT DETECTED NOT DETECTED Final   Influenza B NOT DETECTED NOT DETECTED Final   Parainfluenza Virus 1 NOT DETECTED NOT  DETECTED Final   Parainfluenza Virus 2 NOT DETECTED NOT DETECTED Final   Parainfluenza Virus 3 NOT DETECTED NOT DETECTED Final   Parainfluenza Virus 4 NOT DETECTED NOT DETECTED Final   Respiratory Syncytial Virus NOT DETECTED NOT DETECTED Final   Bordetella pertussis NOT DETECTED NOT DETECTED Final   Chlamydophila pneumoniae NOT DETECTED NOT DETECTED Final   Mycoplasma pneumoniae NOT DETECTED NOT DETECTED Final    Comment: Performed at [facility], [place] [address], [place], [place] [address]     Time coordinating discharge:  I have spent 35 minutes face to face with the patient and on the ward discussing the patients care, assessment, plan and disposition with other care givers. >50% of the time was devoted counseling the patient about the risks and benefits of treatment/Discharge disposition and coordinating care.   SIGNED:   Damita Lack, MD  [facility] 07/21/2018, 1:07 PM Pager   If 7PM-7AM, please contact night-coverage [email or web address] Password [email or web address]

## 2018-07-21 NOTE — Care Management Note (Signed)
Case Management Note  Patient Details  Name: Charlotte Harris MRN: [id number] Date of Birth: 11/26/1931  Subjective/Objective:    For dc today, NCM offered choice for [facility] and [facility], she chose [facility], referral made to [facility] with [facility] .  Soc will begin 24-48 hrs post dc.  She will also need home oxygen. Jermaine will bring oxygen to room prior to dc.                 Action/Plan: DC home when oxygen received to room.  Expected Discharge Date:  07/21/18               Expected Discharge Plan:  [place]  In-House Referral:     Discharge planning Services  CM Consult  Post Acute Care Choice:  Durable Medical Equipment, Home Health Choice offered to:  Patient  DME Arranged:  Oxygen DME Agency:  [facility] Arranged:  RN, Nurse's Aide [facility] Agency:  [facility]  Status of Service:  Completed, signed off  If discussed at [place] of Stay Meetings, dates discussed:    Additional Comments:  Zenon Mayo, RN 07/21/2018, 9:38 AM

## 2018-07-21 NOTE — Progress Notes (Signed)
SATURATION QUALIFICATIONS:   Patient Saturations on Room Air at Rest = 88%  Patient Saturations on Room Air while Ambulating = 77%  Patient Saturations on 2 Liters of oxygen while Ambulating = 90%

## 2018-07-21 NOTE — Telephone Encounter (Signed)
Per son pt was d/c's with nebulizer meds but not given a machine. She was also sent home with portable O2. Pt's son is asking for rx for nebulizer to be sent to [facility]. Per d/c summary pt was given rx for meds. Machine ordered and rx faxed to [facility].

## 2018-07-22 ENCOUNTER — Telehealth: Payer: Self-pay | Admitting: Family Medicine

## 2018-07-22 NOTE — Telephone Encounter (Signed)
Admitted From: Home Disposition: Home with home oxygen  Recommendations for Outpatient Follow-up:  1. Follow up with PCP in 1-2 weeks 2. Please obtain BMP/CBC in one week your next doctors visit.  3. Given 5 days of oral prednisone 4. Home oxygen arranged 2-3 L at rest and with ambulation respectively 5. Follow-up outpatient with pulmonary for PFTs  Home Health: None Equipment/Devices: Home oxygen Discharge Condition: Stable CODE STATUS: Full code Diet recommendation: Diabetic  Transition Care Management Follow-up Telephone Call   Date discharged? 07/21/18   How have you been since you were released from the hospital? "I feel relieved!" " My son brought me home an stayed with me" "I am doing good, so far I do not need any help with anything"   Do you understand why you were in the hospital? yes   Do you understand the discharge instructions? Yes, pt d/c'd with O2 and nebulizers   Where were you discharged to? Home   Items Reviewed:  Medications reviewed: yes  Allergies reviewed: yes  Dietary changes reviewed: yes  Referrals reviewed: yes   Functional Questionnaire:   Activities of Daily Living (ADLs):   She states they are independent in the following: ambulation, bathing and hygiene, feeding, continence, grooming, toileting and dressing States they require assistance with the following: n/a   Any transportation issues/concerns?: no  Any patient concerns? No   Confirmed importance and date/time of follow-up visits scheduled yes  Provider Appointment booked with Dr Ethlyn Gallery on 07/25/18 at 11:30a  Confirmed with patient if condition begins to worsen call PCP or go to the ER.  Patient was given the office number and encouraged to call back with question or concerns.  : yes

## 2018-07-22 NOTE — Telephone Encounter (Signed)
Unable to reach patient at time of TCM Call. Left message for patient to return call when available.  

## 2018-07-25 ENCOUNTER — Ambulatory Visit: Payer: Medicare Other | Admitting: Family Medicine

## 2018-07-25 ENCOUNTER — Telehealth: Payer: Self-pay | Admitting: *Deleted

## 2018-07-25 ENCOUNTER — Encounter: Payer: Self-pay | Admitting: Family Medicine

## 2018-07-25 VITALS — BP 110/40 | HR 85 | Temp 98.0°F | Wt 105.6 lb

## 2018-07-25 DIAGNOSIS — I10 Essential (primary) hypertension: Secondary | ICD-10-CM

## 2018-07-25 DIAGNOSIS — E871 Hypo-osmolality and hyponatremia: Secondary | ICD-10-CM

## 2018-07-25 DIAGNOSIS — J9621 Acute and chronic respiratory failure with hypoxia: Secondary | ICD-10-CM

## 2018-07-25 LAB — BASIC METABOLIC PANEL
BUN: 26 mg/dL — ABNORMAL HIGH (ref 6–23)
CO2: 38 mEq/L — ABNORMAL HIGH (ref 19–32)
Calcium: 9.8 mg/dL (ref 8.4–10.5)
Chloride: 94 mEq/L — ABNORMAL LOW (ref 96–112)
Creatinine, Ser: 0.69 mg/dL (ref 0.40–1.20)
GFR: 86.06 mL/min (ref >60.00)
Glucose, Bld: 90 mg/dL (ref 70–99)
Potassium: 4.3 mEq/L (ref 3.5–5.1)
Sodium: 140 mEq/L (ref 135–145)

## 2018-07-25 LAB — CBC WITH DIFFERENTIAL/PLATELET
Basophils Absolute: 0 10*3/uL (ref 0.0–0.1)
Basophils Relative: 0.2 % (ref 0.0–3.0)
Eosinophils Absolute: 0 10*3/uL (ref 0.0–0.7)
Eosinophils Relative: 0.1 % (ref 0.0–5.0)
HCT: 35.4 % — ABNORMAL LOW (ref 36.0–46.0)
Hemoglobin: 11.4 g/dL — ABNORMAL LOW (ref 12.0–15.0)
Lymphocytes Relative: 5.6 % — ABNORMAL LOW (ref 12.0–46.0)
Lymphs Abs: 0.5 10*3/uL — ABNORMAL LOW (ref 0.7–4.0)
MCHC: 32.1 g/dL (ref 30.0–36.0)
MCV: 97.3 fl (ref 78.0–100.0)
Monocytes Absolute: 0.2 10*3/uL (ref 0.1–1.0)
Monocytes Relative: 2.1 % — ABNORMAL LOW (ref 3.0–12.0)
Neutro Abs: 7.4 10*3/uL (ref 1.4–7.7)
Neutrophils Relative %: 92 % — ABNORMAL HIGH (ref 43.0–77.0)
Platelets: 242 10*3/uL (ref 150.0–400.0)
RBC: 3.64 Mil/uL — ABNORMAL LOW (ref 3.87–5.11)
RDW: 13.9 % (ref 11.5–15.5)
WBC: 8.1 10*3/uL (ref 4.0–10.5)

## 2018-07-25 NOTE — Patient Instructions (Signed)
Please check blood pressure at home and record to report back to me at end of week. Let me know sooner if all numbers you are checking are less than 479 systolic.

## 2018-07-25 NOTE — Progress Notes (Signed)
[name] DOB: 05-06-1932 Encounter date: 07/25/2018  This is a 84 y.o. female who presents with Chief Complaint  Patient presents with  . Hospitalization Follow-up    History of present illness: Admitted 07/18/18 and discharged 07/21/18 Discharge dx: acute on chronic respiratory failure with hypoxia, oxygen dependent. Patient left prior to recommended discharge since she was still hypoxic 77% sat with ambulation (up to 90% n 2L which was improvement from admission). Resp panel was negative. Needs pulm f/u outpatient. Discharged on prednisone. RUL lesion that will require PET scan. Oncology outpatient follow up needed ?appointment?)  Not sure why breathing got worse which prompted her trip to hospital. Never needed oxygen in the past. Has been on the spiriva for years (since 2011). Has been checking O2 at home - runs about 92 - up to 98%. This morning did take off oxygen and dropped to 85% within minutes. Has been trying to do deep breathing exercises during commercial breaks.   Breathing feeling better now.   Has appointment with pulmonology this Friday for follow up on lung mass.   Was put on prednisone which has helped appetite. Eating more at home.   Has not checked blood pressure at home. Has some dizziness when she first gets up and moves around.   No cough. Energy level is ok.     No Known Allergies Current Meds  Medication Sig  . acetaminophen (TYLENOL) 500 MG tablet Take 500 mg by mouth every 6 (six) hours as needed for headache (pain).   . Albuterol Sulfate 108 (90 Base) MCG/ACT AEPB Inhale 1 puff into the lungs 4 (four) times daily as needed. (Patient taking differently: Inhale 1 puff into the lungs 4 (four) times daily as needed (shortness of breath/ wheezing). )  . aspirin EC 81 MG tablet Take 81 mg by mouth at bedtime.  . budesonide (PULMICORT) 0.25 MG/2ML nebulizer solution Take 2 mLs (0.25 mg total) by nebulization 2 (two) times daily.  . Calcium  Carb-Cholecalciferol (CALCIUM 1000 + D PO) Take 2 tablets by mouth daily.  Marland Kitchen ipratropium-albuterol (DUONEB) 0.5-2.5 (3) MG/3ML SOLN Take 3 mLs by nebulization every 4 (four) hours as needed.  Marland Kitchen levothyroxine (SYNTHROID, LEVOTHROID) 50 MCG tablet TAKE 1 TABLET BY MOUTH EVERY DAY (Patient taking differently: Take 50 mcg by mouth daily before breakfast. )  . losartan (COZAAR) 100 MG tablet TAKE 1 TABLET (100 MG TOTAL) BY MOUTH DAILY. (Patient taking differently: Take 100 mg by mouth daily. )  . metFORMIN (GLUCOPHAGE-XR) 500 MG 24 hr tablet Take 1 tablet (500 mg total) by mouth daily. (Patient taking differently: Take 500 mg by mouth daily after supper. )  . metoprolol succinate (TOPROL-XL) 25 MG 24 hr tablet Take 1 tablet (25 mg total) by mouth daily. (Patient taking differently: Take 25 mg by mouth daily after supper. )  . NIFEdipine (PROCARDIA-XL/NIFEDICAL-XL) 30 MG 24 hr tablet TAKE 1 TABLET BY MOUTH EVERY MORNING (Patient taking differently: Take 30 mg by mouth daily. )  . predniSONE (DELTASONE) 50 MG tablet Take 1 tablet (50 mg total) by mouth daily with breakfast for 5 days.  Marland Kitchen Respiratory Therapy Supplies (NEBULIZER COMPRESSOR) KIT PLEASE DISPENSE 1 NEBULIZER MACHINE AND SUPPLIES DX: J44.9  . Spacer/Aero-Holding Chambers (E-Z SPACER) inhaler Use as instructed  . SPIRIVA HANDIHALER 18 MCG inhalation capsule INHALE 1 CAPSULE VIA HANDIHALER ONCE DAILY AT THE SAME TIME EVERY DAY (Patient taking differently: Place 18 mcg into inhaler and inhale daily. )    Review of Systems  Constitutional:  Negative for chills, fatigue and fever.  Respiratory: Negative for cough, chest tightness, shortness of breath and wheezing.   Cardiovascular: Negative for chest pain, palpitations and leg swelling.    Objective:  BP (!) 110/40 (BP Location: Left Arm, Patient Position: Sitting, Cuff Size: Normal)   Pulse 85   Temp 98 F (36.7 C) (Oral)   Wt 105 lb 9.6 oz (47.9 kg)   SpO2 92%   BMI 17.57 kg/m   Weight:  105 lb 9.6 oz (47.9 kg)   BP Readings from Last 3 Encounters:  07/25/18 (!) 110/40  07/21/18 125/69  06/17/18 (!) 142/64   Wt Readings from Last 3 Encounters:  07/25/18 105 lb 9.6 oz (47.9 kg)  07/19/18 104 lb 11.5 oz (47.5 kg)  06/17/18 99 lb 1.6 oz (45 kg)    Physical Exam Constitutional:      General: She is not in acute distress.    Appearance: She is well-developed.  Cardiovascular:     Rate and Rhythm: Normal rate and regular rhythm.     Heart sounds: Murmur present. Systolic murmur present with a grade of 2/6. No friction rub.     Comments: No lower extremity edema Pulmonary:     Effort: Pulmonary effort is normal. No respiratory distress.     Breath sounds: Normal breath sounds. No wheezing or rales.  Neurological:     Mental Status: She is alert and oriented to person, place, and time.  Psychiatric:        Behavior: Behavior normal.     Assessment/Plan  1. Hyponatremia - Basic metabolic panel; Future  2. Hypertension, unspecified type Check at home; concerned for lower blood pressure here in office today. We will have her monitor at home and report to [place] at end of week.  - CBC with Differential/Platelet; Future  3. Acute on chronic respiratory failure with hypoxia (HCC) Following with pulm for lung mass/resp failure at end of week. Will follow along with them.    Return in about 3 months (around 10/24/2018) for Chronic condition visit.    Micheline Rough, MD

## 2018-07-25 NOTE — Addendum Note (Signed)
Addended by: Elmer Picker on: 07/25/2018 12:07 PM   Modules accepted: Orders

## 2018-07-25 NOTE — Telephone Encounter (Signed)
Copied from [place] [id number]. Topic: Quick Communication - Home Health Verbal Orders >> Jul 25, 2018  3:07 PM Ivar Drape wrote: Caller/Agency: Wells Guiles w/[facility] Callback Number:   [phone] Requesting OT/PT/Skilled Nursing/Social Work:  Skilled Nursing  Frequency: 1w4

## 2018-07-26 NOTE — Telephone Encounter (Signed)
ok 

## 2018-07-26 NOTE — Telephone Encounter (Signed)
Verbal orders given to [facility]

## 2018-07-28 NOTE — Progress Notes (Signed)
_0  ID: Charlotte Harris, female    DOB: 08-Oct-1931, 84 y.o.   MRN: [id number]  Chief Complaint  Patient presents with  . Hospitalization Follow-up    Pt was in the hospital 12/16-12/19. Currently on 3L of O2. Wants to see if she still needs the O2.     Referring provider: Caren Macadam, MD  HPI:  84 year old female former smoker followed in our office for COPD  PMH:  PMH of adenocarcinoma of the lung s/p XRT to RUL (released from Dr. Ida Rogue care in 2017 Smoker/ Smoking History: Former smoker Maintenance: Spiriva Pt of: saw Dr. Chase Caller INPT  Recent  Pulmonary Encounters:   07/19/2018-consult note-[place] Acute hypoxic respiratory failure suspected multifactorial in setting of suspected COPD and history of right upper lobe lung cancer. Plan: Spiriva, as needed albuterol, will need outpatient pulmonary function test, follow-up with Dr. Lisbeth Renshaw at discharge, doxycycline, repeat PET scan, follow-up with pulmonary discharge  07/21/2018-discharge summary from hosp Follow-up with PCP in 1 to 2 weeks, needs BMP and CBC, 5 days of oral prednisone, arranged home oxygen 2 to 3 L at rest and with ambulation, follow-up outpatient with pulmonary for pulmonary function test  07/29/2018  - Visit   84 year old female former smoker presenting to our office today for hospital follow-up.  Patient with COPD and right upper lobe lung mass with potential rib involvement.  Patient with a history of adenocarcinoma status post radiation.  Patient previously followed by Dr. Lisbeth Renshaw.  Patient is not seen Dr. Lisbeth Renshaw in some time as patient was told she no longer needed follow-up.  Patient reports that since being discharged from the hospital she feels okay, may be somewhat better.  Patient was started on budesonide nebulized medications but remains on Spiriva HandiHaler.  Patient denies any current respiratory symptoms or fevers.  MMRC - Breathlessness Score 2 - on level ground, I walk slower  than people of the same age because of breathlessness, or have to stop for breathe when walking to my own pace  Patient does have a request to have smaller portable oxygen.  Patient also would like to be evaluated to see if she needs to remain on oxygen.  Patient is currently on 3 L continuous.  Tests:   CTA Chest 07/18/18 >> neg for PE, interval increase in size of right consolidative mass adjacent to a pathologic rib fracture.  Favor progressive inflammatory process vs indolent carcinoma.  Adjacent angular RUL nodule slightly increased in thickness since 08/2015.    ECHO 07/19/18 >> LVEF 65-70%, grade 1 DD, pulmonary artery normal sized, RA normal sized, mild TR  07/19/2018-pulmonary function test- FVC 0.89 (33% predicted), ratio 45, FEV1 20 >>>Severe obstructive airway disease  FENO:  No results found for: NITRICOXIDE  PFT: PFT Results Latest Ref Rng & Units 07/19/2018  FVC-Pre L 0.89  FVC-Predicted Pre % 33  Pre FEV1/FVC % % 45  FEV1-Pre L 0.40  FEV1-Predicted Pre % 20    Imaging: Ct Angio Chest Pe W And/or Wo Contrast  Result Date: 07/18/2018 CLINICAL DATA:  Short of breath, chest pain. EXAM: CT ANGIOGRAPHY CHEST WITH CONTRAST TECHNIQUE: Multidetector CT imaging of the chest was performed using the standard protocol during bolus administration of intravenous contrast. Multiplanar CT image reconstructions and MIPs were obtained to evaluate the vascular anatomy. CONTRAST:  48m ISOVUE-370 IOPAMIDOL (ISOVUE-370) INJECTION 76% COMPARISON:  CT 09/02/2015 FINDINGS: Cardiovascular: No filling defects within the pulmonary arteries to suggest acute pulmonary embolism. No acute findings of the aorta or great  vessels. No pericardial fluid. Mediastinum/Nodes: No axillary or supraclavicular adenopathy. No mediastinal hilar adenopathy. No pericardial effusion. Esophagus normal Lungs/Pleura: Peripheral consolidation in the RIGHT upper lobe adjacent to a pathologic rib fracture measures 28 x 20 mm  increased from 25 x 22 mm. Lesion is same shape as comparison exam although more thickened. Angular nodule in the RIGHT upper lobe adjacent to the consolidation measures 8 mm x 16 mm compared to 22 x 11. This nodule also appears slightly thickened compared to prior. No new pulmonary nodules. Centrilobular emphysema the upper lobes. Upper Abdomen: Limited view of the liver, kidneys, pancreas are unremarkable. Normal adrenal glands. Musculoskeletal: Scoliosis of the spine. Stable pathologic rib fracture anteriorly on the RIGHT second rib. Review of the MIP images confirms the above findings. IMPRESSION: 1. No evidence acute pulmonary embolism. 2. No acute finding of the aorta or great vessels. 3. Interval increase in size of RIGHT consolidative mass adjacent to a pathologic rib fracture. Favor progressive inflammatory process versus indolent carcinoma. 4. Adjacent angular RIGHT upper lobe nodule also slightly increased in thickness from 09/02/2015. Favored benign inflammatory thickening. Electronically Signed   By: Suzy Bouchard M.D.   On: 07/18/2018 18:59   Dg Chest Portable 1 View  Result Date: 07/18/2018 CLINICAL DATA:  Chest pain EXAM: PORTABLE CHEST 1 VIEW COMPARISON:  None. FINDINGS: The lungs are hyperinflated likely secondary to COPD. There is blunting of the left costophrenic angle likely reflecting a trace pleural effusion versus chronic pleural thickening. There is a right upper lobe spiculated airspace opacity likely reflecting posttreatment changes with recurrent or residual neoplasm not excluded. There is no pneumothorax. There is stable cardiomegaly. There is thoracic aortic atherosclerosis. The osseous structures are unremarkable. IMPRESSION: Right upper lobe spiculated airspace opacity likely reflecting posttreatment changes with recurrent or residual neoplasm not excluded. COPD. Electronically Signed   By: Kathreen Devoid   On: 07/18/2018 16:07      Specialty Problems      Pulmonary  Problems   COPD GOLD IV B (based on 07/19/18 spiro, pfts ordered)    Qualifier: Diagnosis of  By: Lamonte Sakai MD, Rose Fillers       Malignant neoplasm of bronchus and lung ([place])    Qualifier: Diagnosis of  By: Loanne Drilling MD, Sean A       Malignant neoplasm of right upper lobe of lung ([place])   Acute on chronic respiratory failure with hypoxia ([place])   Mass of upper lobe of left lung   Lung mass   Solitary pulmonary nodule      No Known Allergies  Immunization History  Administered Date(s) Administered  . Influenza Whole 06/03/2009, 05/03/2010  . Influenza-Unspecified 04/04/2011, 05/03/2013, 05/03/2014  . Pneumococcal Conjugate-13 08/16/2014  . Pneumococcal Polysaccharide-23 06/04/1997, 07/03/2010  . Td 01/01/1998  . Tdap 11/15/2014  . Zoster 02/13/2014    Past Medical History:  Diagnosis Date  . ADENOCARCINOMA, LUNG 07/03/2010   RUL  . ASYMPTOMATIC POSTMENOPAUSAL STATUS 07/02/2009  . Breast cancer ([place]) 1991   R mastectomy, no chemo or radiation  . COLONIC POLYPS, HX OF 04/16/2007  . COPD 05/17/2008  . HEMATURIA, HX OF 04/16/2007  . History of radiation therapy 07/30/09 to 08/09/09   RUL lung  . HYPERLIPIDEMIA 04/16/2007  . Hypertension   . HYPERTENSION 04/16/2007  . HYPOTHYROIDISM, POST-RADIATION 06/07/2008  . Osteoarthritis   . OSTEOPOROSIS 07/02/2009  . Wrist fracture, left     Tobacco History: Social History   Tobacco Use  Smoking Status Former Smoker  . Packs/day: 1.00  .  Years: 50.00  . Pack years: 50.00  . Types: Cigarettes  . Last attempt to quit: 08/03/2009  . Years since quitting: 8.9  Smokeless Tobacco Never Used   Counseling given: Yes  Continue to not smoke.  Outpatient Encounter Medications as of 07/29/2018  Medication Sig  . acetaminophen (TYLENOL) 500 MG tablet Take 500 mg by mouth every 6 (six) hours as needed for headache (pain).   . Albuterol Sulfate 108 (90 Base) MCG/ACT AEPB Inhale 1 puff into the lungs 4 (four) times daily as needed. (Patient  taking differently: Inhale 1 puff into the lungs 4 (four) times daily as needed (shortness of breath/ wheezing). )  . aspirin EC 81 MG tablet Take 81 mg by mouth at bedtime.  . budesonide (PULMICORT) 0.25 MG/2ML nebulizer solution Take 2 mLs (0.25 mg total) by nebulization 2 (two) times daily.  . Calcium Carb-Cholecalciferol (CALCIUM 1000 + D PO) Take 2 tablets by mouth daily.  Marland Kitchen ipratropium-albuterol (DUONEB) 0.5-2.5 (3) MG/3ML SOLN Take 3 mLs by nebulization every 4 (four) hours as needed.  Marland Kitchen levothyroxine (SYNTHROID, LEVOTHROID) 50 MCG tablet TAKE 1 TABLET BY MOUTH EVERY DAY (Patient taking differently: Take 50 mcg by mouth daily before breakfast. )  . losartan (COZAAR) 100 MG tablet TAKE 1 TABLET (100 MG TOTAL) BY MOUTH DAILY. (Patient taking differently: Take 100 mg by mouth daily. )  . metFORMIN (GLUCOPHAGE-XR) 500 MG 24 hr tablet Take 1 tablet (500 mg total) by mouth daily. (Patient taking differently: Take 500 mg by mouth daily after supper. )  . metoprolol succinate (TOPROL-XL) 25 MG 24 hr tablet Take 1 tablet (25 mg total) by mouth daily. (Patient taking differently: Take 25 mg by mouth daily after supper. )  . NIFEdipine (PROCARDIA-XL/NIFEDICAL-XL) 30 MG 24 hr tablet TAKE 1 TABLET BY MOUTH EVERY MORNING (Patient taking differently: Take 30 mg by mouth daily. )  . Respiratory Therapy Supplies (NEBULIZER COMPRESSOR) KIT PLEASE DISPENSE 1 NEBULIZER MACHINE AND SUPPLIES DX: J44.9  . SPIRIVA HANDIHALER 18 MCG inhalation capsule INHALE 1 CAPSULE VIA HANDIHALER ONCE DAILY AT THE SAME TIME EVERY DAY (Patient taking differently: Place 18 mcg into inhaler and inhale daily. )  . Tiotropium Bromide-Olodaterol (STIOLTO RESPIMAT) 2.5-2.5 MCG/ACT AERS Inhale 2 puffs into the lungs daily.  . [DISCONTINUED] Spacer/Aero-Holding Chambers (E-Z SPACER) inhaler Use as instructed   No facility-administered encounter medications on file as of 07/29/2018.      Review of Systems  Review of Systems    Constitutional: Positive for activity change. Negative for chills, fever and unexpected weight change.  HENT: Negative for congestion, ear pain, postnasal drip, sinus pressure and sinus pain.   Respiratory: Positive for shortness of breath. Negative for cough, chest tightness and wheezing.   Cardiovascular: Negative for chest pain and palpitations.  Gastrointestinal: Negative for blood in stool, diarrhea, nausea and vomiting.  Musculoskeletal: Negative for arthralgias.  Skin: Negative for color change.  Allergic/Immunologic: Negative for environmental allergies and food allergies.  Neurological: Negative for dizziness, light-headedness and headaches.  Psychiatric/Behavioral: Negative for dysphoric mood. The patient is not nervous/anxious.   All other systems reviewed and are negative.    Physical Exam  BP 114/68 (BP Location: Left Arm, Patient Position: Sitting, Cuff Size: Normal)   Pulse 78   Ht _0  (1.651 m)   Wt 104 lb (47.2 kg)   SpO2 99%   BMI 17.31 kg/m   Wt Readings from Last 5 Encounters:  07/29/18 104 lb (47.2 kg)  07/25/18 105 lb 9.6 oz (47.9  kg)  07/19/18 104 lb 11.5 oz (47.5 kg)  06/17/18 99 lb 1.6 oz (45 kg)  01/31/18 96 lb 6.4 oz (43.7 kg)   3L via [place]   Physical Exam  Constitutional: She is oriented to person, place, and time and well-developed, well-nourished, and in no distress. Vital signs are normal. She does not have a sickly appearance. No distress.  +frail elderly female  HENT:  Head: Normocephalic and atraumatic.  Right Ear: Hearing, tympanic membrane, external ear and ear canal normal.  Left Ear: Hearing, tympanic membrane, external ear and ear canal normal.  Nose: Mucosal edema present. Right sinus exhibits no maxillary sinus tenderness and no frontal sinus tenderness. Left sinus exhibits no maxillary sinus tenderness and no frontal sinus tenderness.  Mouth/Throat: Uvula is midline and oropharynx is clear and moist. No oropharyngeal exudate.  + Dry  healing nasal mucosa  Eyes: Pupils are equal, round, and reactive to light.  Neck: Normal range of motion. Neck supple.  Cardiovascular: Normal rate, regular rhythm and normal heart sounds.  Pulmonary/Chest: Effort normal and breath sounds normal. No accessory muscle usage. No respiratory distress. She has no decreased breath sounds. She has no wheezes. She has no rhonchi.  +barrel chest  Musculoskeletal: Normal range of motion.        General: No edema.  Lymphadenopathy:    She has no cervical adenopathy.  Neurological: She is alert and oriented to person, place, and time. Gait normal.  Skin: Skin is warm and dry. She is not diaphoretic. No erythema.  Psychiatric: Mood, memory, affect and judgment normal.  Nursing note and vitals reviewed.   Walk in office today.  Patient needed 2 L via nasal cannula to maintain oxygen saturations greater than 90%.     Lab Results:  CBC    Component Value Date/Time   WBC 8.1 07/25/2018 1207   RBC 3.64 (L) 07/25/2018 1207   HGB 11.4 (L) 07/25/2018 1207   HCT 35.4 (L) 07/25/2018 1207   PLT 242.0 07/25/2018 1207   MCV 97.3 07/25/2018 1207   MCH 30.9 07/20/2018 0241   MCHC 32.1 07/25/2018 1207   RDW 13.9 07/25/2018 1207   LYMPHSABS 0.5 (L) 07/25/2018 1207   MONOABS 0.2 07/25/2018 1207   EOSABS 0.0 07/25/2018 1207   BASOSABS 0.0 07/25/2018 1207    BMET    Component Value Date/Time   NA 140 07/25/2018 1207   K 4.3 07/25/2018 1207   CL 94 (L) 07/25/2018 1207   CO2 38 (H) 07/25/2018 1207   GLUCOSE 90 07/25/2018 1207   BUN 26 (H) 07/25/2018 1207   BUN 19.9 09/02/2015 1309   CREATININE 0.69 07/25/2018 1207   CREATININE 0.9 09/02/2015 1309   CALCIUM 9.8 07/25/2018 1207   CALCIUM 9.6 07/05/2012 1409   GFRNONAA NOT CALCULATED 07/20/2018 0241   GFRAA NOT CALCULATED 07/20/2018 0241    BNP No results found for: BNP  ProBNP No results found for: PROBNP    Assessment & Plan:   Pleasant 84 year old female patient completing follow-up  with our office today.  Patient needs to reestablish care with oncology Dr. Lisbeth Renshaw to further evaluate CT scan.  Will refer patient.  We will also order PET scan for further evaluation of potential malignancy.  We will have patient establish with Dr. Valeta Harms for pulmonary care.  Patient did not want to establish with pulmonary provider seen in hospital.  Will trial patient on Stiolto and budesonide nebulized medications to see how this manages her breathing.  Could consider patient walk  for POC at next office visit.  Patient needs pulmonary function testing as well.  COPD GOLD IV B (based on 07/19/18 spiro, pfts ordered) Assessment: Very severe COPD based off of simple spirometry in hospital in 07/19/2016 Probably more emphysema driven then mixed disease No baseline sputum Occasional dyspnea Patient feels that she is doing better after started on budesonide nebulized medications as well as 5-day course of prednisone  Plan: Walk today to assess oxygenation patient will need 2 L continuous Patient is to maintain oxygen saturations greater than 90% We will trial patient on Stiolto Will DC  Spiriva HandiHaler Continue budesonide nebulized medications Patient can continue to use rescue inhaler as well as nebulized duo nebs as needed for shortness of breath and wheezing We will order pulmonary function testing Patient to be established with Dr. Valeta Harms for pulmonary care   Acute on chronic respiratory failure with hypoxia ([place]) Walk today in office to assess oxygenation   Continue oxygen therapy as prescribed -2 L continuous, if oxygen saturations drop below 88% can increase to 3 L >>>maintain oxygen saturations greater than 88 percent  >>>if unable to maintain oxygen saturations please contact the office  >>>do not smoke with oxygen  >>>can use nasal saline gel or nasal saline rinses to moisturize nose if oxygen causes dryness  We will get you scheduled for an office visit on January 9 to  establish with Dr. Valeta Harms. >>> Could consider walk for portable oxygen concentrator at this time   Solitary pulmonary nodule We will order pulmonary function testing  We will place a referral for oncology for Dr. Lisbeth Renshaw  I have ordered a PET scan to further evaluate that mass he can have this completed prior to seeing Dr. Benny Lennert, NP 07/29/2018   This appointment was 48 min long with over 50% of the time in direct face-to-face patient care, assessment, plan of care, and follow-up.

## 2018-07-29 ENCOUNTER — Encounter: Payer: Self-pay | Admitting: Pulmonary Disease

## 2018-07-29 ENCOUNTER — Ambulatory Visit (INDEPENDENT_AMBULATORY_CARE_PROVIDER_SITE_OTHER): Payer: Medicare Other | Admitting: Pulmonary Disease

## 2018-07-29 VITALS — BP 114/68 | HR 78 | Ht 65.0 in | Wt 104.0 lb

## 2018-07-29 DIAGNOSIS — R911 Solitary pulmonary nodule: Secondary | ICD-10-CM | POA: Diagnosis not present

## 2018-07-29 DIAGNOSIS — J9621 Acute and chronic respiratory failure with hypoxia: Secondary | ICD-10-CM | POA: Diagnosis not present

## 2018-07-29 DIAGNOSIS — J439 Emphysema, unspecified: Secondary | ICD-10-CM | POA: Diagnosis not present

## 2018-07-29 MED ORDER — TIOTROPIUM BROMIDE-OLODATEROL 2.5-2.5 MCG/ACT IN AERS: 2.0000 | INHALATION_SPRAY | Freq: Every day | RESPIRATORY_TRACT | 0 refills | Status: DC

## 2018-07-29 NOTE — Addendum Note (Signed)
Addended by: Lauraine Rinne on: 07/29/2018 01:58 PM   Modules accepted: Orders

## 2018-07-29 NOTE — Assessment & Plan Note (Signed)
Assessment: Very severe COPD based off of simple spirometry in hospital in 07/19/2016 Probably more emphysema driven then mixed disease No baseline sputum Occasional dyspnea Patient feels that she is doing better after started on budesonide nebulized medications as well as 5-day course of prednisone  Plan: Walk today to assess oxygenation patient will need 2 L continuous Patient is to maintain oxygen saturations greater than 90% We will trial patient on Stiolto Will DC  Spiriva HandiHaler Continue budesonide nebulized medications Patient can continue to use rescue inhaler as well as nebulized duo nebs as needed for shortness of breath and wheezing We will order pulmonary function testing Patient to be established with Dr. Valeta Harms for pulmonary care

## 2018-07-29 NOTE — Assessment & Plan Note (Signed)
Walk today in office to assess oxygenation   Continue oxygen therapy as prescribed -2 L continuous, if oxygen saturations drop below 88% can increase to 3 L >>>maintain oxygen saturations greater than 88 percent  >>>if unable to maintain oxygen saturations please contact the office  >>>do not smoke with oxygen  >>>can use nasal saline gel or nasal saline rinses to moisturize nose if oxygen causes dryness  We will get you scheduled for an office visit on January 9 to establish with Dr. Valeta Harms. >>> Could consider walk for portable oxygen concentrator at this time

## 2018-07-29 NOTE — Progress Notes (Signed)
Patient seen in the office today and instructed on use of Stiolto.  Patient expressed understanding and demonstrated technique.  Benetta Spar [facility] 07/29/18

## 2018-07-29 NOTE — Assessment & Plan Note (Signed)
We will order pulmonary function testing  We will place a referral for oncology for Dr. Lisbeth Renshaw  I have ordered a PET scan to further evaluate that mass he can have this completed prior to seeing Dr. Lisbeth Renshaw

## 2018-07-29 NOTE — Patient Instructions (Addendum)
Walk today in office to assess oxygenation   Start Stiolto Respimat inhaler >>>2 puffs daily >>>Take this no matter what >>>This is not a rescue inhaler >>>THIS REPLACES YOUR SPIRIVA  >>>Call us in a week and let us know   Continue budesonide nebulized medication >>> Do this twice a day >>>Take this no matter what >>>Rinse mouth out after use  Only use your albuterol as a rescue medication to be used if you can't catch your breath by resting or doing a relaxed purse lip breathing pattern.  - The less you use it, the better it will work when you need it. - Ok to use up to 2 puffs  every 4 hours if you must but call for immediate appointment if use goes up over your usual need - Don't leave home without it !!  (think of it like the spare tire for your car)   Okay to use DuoNeb nebulized medication every 6 hours as needed for shortness of breath and wheezing >>> If having to use this daily please contact our office  Note your daily symptoms > remember "red flags" for COPD:   >>>Increase in cough >>>increase in sputum production >>>increase in shortness of breath or activity  intolerance.   If you notice these symptoms, please call the office to be seen.   Can consider pulmonary function testing in 2 to 4 weeks to further evaluate breathing >>>can see Wyn Quaker FNP after test to rev   Continue oxygen therapy as prescribed -2 L continuous, if oxygen saturations drop below 88% can increase to 3 L >>>maintain oxygen saturations greater than 88 percent  >>>if unable to maintain oxygen saturations please contact the office  >>>do not smoke with oxygen  >>>can use nasal saline gel or nasal saline rinses to moisturize nose if oxygen causes dryness  We will get you scheduled for an office visit on January 9 to establish with Dr. Valeta Harms. >>> Could consider walk for portable oxygen concentrator at this time  We will place a referral for oncology for Dr. Lisbeth Renshaw  I have ordered a PET scan  to further evaluate that mass he can have this completed prior to seeing Dr. Lisbeth Renshaw  It is flu season:   >>>Remember to be washing your hands regularly, using hand sanitizer, be careful to use around herself with has contact with people who are sick will increase her chances of getting sick yourself. >>> Best ways to protect herself from the flu: Receive the yearly flu vaccine, practice good hand hygiene washing with soap and also using hand sanitizer when available, eat a nutritious meals, get adequate rest, hydrate appropriately   Please contact the office if your symptoms worsen or you have concerns that you are not improving.   Thank you for choosing [facility] for your healthcare, and for allowing [place] to partner with you on your healthcare journey. I am thankful to be able to provide care to you today.   Wyn Quaker FNP-C

## 2018-08-05 NOTE — Progress Notes (Signed)
PCCM: Agree with PET evaluation. Looks like its scheduled prior to seeing me which is good. Thanks for seeing.  [facility] 08/05/2018 8:24 PM

## 2018-08-08 ENCOUNTER — Ambulatory Visit (HOSPITAL_COMMUNITY)
Admission: RE | Admit: 2018-08-08 | Discharge: 2018-08-08 | Disposition: A | Discharge status: Home / Self Care | Payer: Medicare Other | Source: Ambulatory Visit | Attending: Pulmonary Disease | Admitting: Pulmonary Disease

## 2018-08-08 ENCOUNTER — Telehealth: Payer: Self-pay | Admitting: Pulmonary Disease

## 2018-08-08 DIAGNOSIS — R911 Solitary pulmonary nodule: Secondary | ICD-10-CM | POA: Diagnosis not present

## 2018-08-08 LAB — GLUCOSE, CAPILLARY: Glucose-Capillary: 149 mg/dL — ABNORMAL HIGH (ref 70–99)

## 2018-08-08 MED ORDER — FLUDEOXYGLUCOSE F - 18 (FDG) INJECTION
5.7000 | Freq: Once | INTRAVENOUS | Status: AC | PRN
  Administered 2018-08-08: 5.7 via INTRAVENOUS

## 2018-08-08 MED ORDER — TIOTROPIUM BROMIDE-OLODATEROL 2.5-2.5 MCG/ACT IN AERS: 2.0000 | INHALATION_SPRAY | Freq: Every day | RESPIRATORY_TRACT | 11 refills | Status: DC

## 2018-08-08 NOTE — Progress Notes (Signed)
Routing to [employer] as FYI.   Aaron Edelman

## 2018-08-08 NOTE — Progress Notes (Signed)
PET scan results have come back.  Very low metabolic activity associated with right upper lobe lesions this is most likely consistent with a benign inflammatory process.  There is no evidence of metastatic disease or mediastinal metastatic adenopathy.  I still believe you should follow-up with Dr. Lisbeth Renshaw for at least 1 office visit.  Keep follow-up office visit with Dr. Valeta Harms this week.  He can go over the PET scan results more in depth.  Wyn Quaker, FNP

## 2018-08-08 NOTE — Progress Notes (Signed)
Thoracic Location of Tumor / Histology: Malignant neoplasm of right upper lobe of lung  -History of adenocarcinoma of RUL  Patient presented   Rescan in 6 months  PET 08/08/2018: Very low metabolic activity associated with the RUL lesions consistent with benign inflammatory processes.  No evidence of mediastinal metastatic adenopathy.  No evidence distant metastatic disease.  CTA 07/18/2018: Peripheral consolidation in the RIGHT upper lobe adjacent to a pathologic rib fracture measures 28 x 20 mm increased from 25 x 22 mm. Lesion is same shape as comparison exam although more thickened. Angular nodule in the RIGHT upper lobe adjacent to the consolidation measures 8 mm x 16 mm compared to 22 x 11  Biopsies of   Tobacco/Marijuana/Snuff/ETOH use: Former Smoker  Past/Anticipated interventions by cardiothoracic surgery, if any:   Past/Anticipated interventions by medical oncology, if any:   Signs/Symptoms  Weight changes, if any:   Respiratory complaints, if any: 3 Liters oxygen  Hemoptysis, if any:   Pain issues, if any:    BP (!) 145/70 (BP Location: Left Arm, Patient Position: Sitting)   Pulse (!) 111   Temp 98 F (36.7 C) (Oral)   Resp (!) 24   Ht 5\' 5"  (1.651 m)   Wt 100 lb 6.4 oz (45.5 kg)   SpO2 98%   BMI 16.71 kg/m    Wt Readings from Last 3 Encounters:  08/09/18 100 lb 6.4 oz (45.5 kg)  07/29/18 104 lb (47.2 kg)  07/25/18 105 lb 9.6 oz (47.9 kg)   SAFETY ISSUES:  Prior radiation? SBRT 2010 RUL, released from Dr. Lisbeth Renshaw 2017.  Pacemaker/ICD? No  Possible current pregnancy? Post menopausal  Is the patient on methotrexate? No  Current Complaints / other details:

## 2018-08-08 NOTE — Telephone Encounter (Signed)
Rx of stiolto has been sent to pt's preferred pharmacy.  Attempted to call pt to let her know that this had been done but unable to reach her. Left a detailed message for pt letting her know that the Rx had been sent in. Nothing further needed.

## 2018-08-09 ENCOUNTER — Other Ambulatory Visit: Payer: Self-pay

## 2018-08-09 ENCOUNTER — Ambulatory Visit
Admission: RE | Admit: 2018-08-09 | Discharge: 2018-08-09 | Disposition: A | Discharge status: Home / Self Care | Payer: Medicare Other | Source: Ambulatory Visit | Attending: Radiation Oncology | Admitting: Radiation Oncology

## 2018-08-09 ENCOUNTER — Encounter: Payer: Self-pay | Admitting: Radiation Oncology

## 2018-08-09 VITALS — BP 145/70 | HR 111 | Temp 98.0°F | Resp 24 | Ht 65.0 in | Wt 100.4 lb

## 2018-08-09 DIAGNOSIS — C3411 Malignant neoplasm of upper lobe, right bronchus or lung: Secondary | ICD-10-CM

## 2018-08-09 DIAGNOSIS — C3490 Malignant neoplasm of unspecified part of unspecified bronchus or lung: Secondary | ICD-10-CM

## 2018-08-10 NOTE — Progress Notes (Addendum)
Radiation Oncology         [phone] ________________________________  Name: Charlotte Harris        MRN: [id number]  Date of Service: 08/09/2018 DOB: 05-21-1932  WU:[id number], Steele Berg, MD  Lauraine Rinne, NP     REFERRING PHYSICIAN: Lauraine Rinne, NP   DIAGNOSIS: The primary encounter diagnosis was Malignant neoplasm of right upper lobe of lung ([place]). A diagnosis of Malignant neoplasm of bronchus and lung ([place]) was also pertinent to this visit.   HISTORY OF PRESENT ILLNESS: Charlotte Harris is a 85 y.o. female seen at the request of Wyn Quaker, NP for a history of early stage lung cancer. The patient was diagnosed in October 2010 with a Stage I adenocarcinoma of the RUL. She underwent SBRT to the RUL in October 2010. Her initial PET imaging showed the lesion at SUV of 11.8, and the secondary lesion with an SUV of 2.1. She was followed by Dr. Lisbeth Renshaw until 2017 in surveillance. She had work up for recurrence in October 2013, and a biopsy of the RUL revealed fibrosis and mild change of atypia. No progressive changes were noted over subsequent years. She has been recently seen by pulmonary due to a COPD exacerbation and hospitalization. She was discharged on 3L O2. She had a CT during her hospitalization that revealed a 28 x 20 mm RUL lesion and this was previously 25 x 22 mm, and adjacent to this also in the RUL there was an 8 x 16 mm lesion, previously 22 x 11 mm. She also underwent PET imaging yesterday. The blood pool was 1.65, and the larger lesion had an SUV of 2.1, and the smaller an SUV of 0.6. She comes today to discuss these findings.     PREVIOUS RADIATION THERAPY: Yes   October 2010 SBRT Treatment: RUL was    PAST MEDICAL HISTORY:  Past Medical History:  Diagnosis Date  . ADENOCARCINOMA, LUNG 07/03/2010   RUL  . ASYMPTOMATIC POSTMENOPAUSAL STATUS 07/02/2009  . Breast cancer ([place]) 1991   R mastectomy, no chemo or radiation  . COLONIC POLYPS, HX OF 04/16/2007  . COPD  05/17/2008  . HEMATURIA, HX OF 04/16/2007  . History of radiation therapy 07/30/09 to 08/09/09   RUL lung  . HYPERLIPIDEMIA 04/16/2007  . Hypertension   . HYPERTENSION 04/16/2007  . HYPOTHYROIDISM, POST-RADIATION 06/07/2008  . Osteoarthritis   . OSTEOPOROSIS 07/02/2009  . Wrist fracture, left        PAST SURGICAL HISTORY: Past Surgical History:  Procedure Laterality Date  . BREAST SURGERY     lumpectomy  . EYE SURGERY Bilateral    cataracts  . 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 LUNG BIOPSY  05/22/2009   RUL lung=Adenocarcinoma  . MASTECTOMY  1991   right  . TONSILLECTOMY  1955     FAMILY HISTORY:  Family History  Problem Relation Age of Onset  . Heart attack Mother 67  . Heart disease Mother   . Cancer Sister        "throat" cancer  . Heart disease Father   . Cancer Brother        blood  . Heart disease Sister   . Cancer Brother        blood  . Heart disease Other        CAD  . Hypertension Other   . Colon cancer Neg Hx   . Stomach cancer Neg Hx  SOCIAL HISTORY:  reports that she quit smoking about 9 years ago. Her smoking use included cigarettes. She has a 50.00 pack-year smoking history. She has never used smokeless tobacco. She reports current alcohol use of about 2.0 standard drinks of alcohol per week. She reports that she does not use drugs.   ALLERGIES: Patient has no known allergies.   MEDICATIONS:  Current Outpatient Medications  Medication Sig Dispense Refill  . acetaminophen (TYLENOL) 500 MG tablet Take 500 mg by mouth every 6 (six) hours as needed for headache (pain).     . Albuterol Sulfate 108 (90 Base) MCG/ACT AEPB Inhale 1 puff into the lungs 4 (four) times daily as needed. (Patient taking differently: Inhale 1 puff into the lungs 4 (four) times daily as needed (shortness of breath/ wheezing). ) 1 each 5  . aspirin EC 81 MG tablet Take 81 mg by mouth at bedtime.    . budesonide (PULMICORT) 0.25 MG/2ML  nebulizer solution Take 2 mLs (0.25 mg total) by nebulization 2 (two) times daily. 120 mL 0  . Calcium Carb-Cholecalciferol (CALCIUM 1000 + D PO) Take 2 tablets by mouth daily.    Marland Kitchen ipratropium-albuterol (DUONEB) 0.5-2.5 (3) MG/3ML SOLN Take 3 mLs by nebulization every 4 (four) hours as needed. 360 mL 0  . levothyroxine (SYNTHROID, LEVOTHROID) 50 MCG tablet TAKE 1 TABLET BY MOUTH EVERY DAY (Patient taking differently: Take 50 mcg by mouth daily before breakfast. ) 30 tablet 5  . losartan (COZAAR) 100 MG tablet TAKE 1 TABLET (100 MG TOTAL) BY MOUTH DAILY. (Patient taking differently: Take 100 mg by mouth daily. ) 90 tablet 0  . metFORMIN (GLUCOPHAGE-XR) 500 MG 24 hr tablet Take 1 tablet (500 mg total) by mouth daily. (Patient taking differently: Take 500 mg by mouth daily after supper. ) 270 tablet 3  . metoprolol succinate (TOPROL-XL) 25 MG 24 hr tablet Take 1 tablet (25 mg total) by mouth daily. (Patient taking differently: Take 25 mg by mouth daily after supper. ) 90 tablet 3  . NIFEdipine (PROCARDIA-XL/NIFEDICAL-XL) 30 MG 24 hr tablet TAKE 1 TABLET BY MOUTH EVERY MORNING (Patient taking differently: Take 30 mg by mouth daily. ) 90 tablet 0  . Respiratory Therapy Supplies (NEBULIZER COMPRESSOR) KIT PLEASE DISPENSE 1 NEBULIZER MACHINE AND SUPPLIES DX: J44.9 1 each 0  . SPIRIVA HANDIHALER 18 MCG inhalation capsule INHALE 1 CAPSULE VIA HANDIHALER ONCE DAILY AT THE SAME TIME EVERY DAY (Patient taking differently: Place 18 mcg into inhaler and inhale daily. ) 90 capsule 3  . Tiotropium Bromide-Olodaterol (STIOLTO RESPIMAT) 2.5-2.5 MCG/ACT AERS Inhale 2 puffs into the lungs daily. 1 Inhaler 11   No current facility-administered medications for this encounter.      REVIEW OF SYSTEMS: On review of systems, the patient reports that she is doing well overall considering the need for oxygen. She's hoping to have a different O2 tank so she doesn't have to carry around the one she has currently. She denies  shortness of breath when this is being used. She denies any chest pain, cough, fevers, chills, night sweats, unintended weight changes. She denies any bowel or bladder disturbances, and denies abdominal pain, nausea or vomiting. She denies any new musculoskeletal or joint aches or pains. A complete review of systems is obtained and is otherwise negative.     PHYSICAL EXAM:  Wt Readings from Last 3 Encounters:  08/09/18 100 lb 6.4 oz (45.5 kg)  07/29/18 104 lb (47.2 kg)  07/25/18 105 lb 9.6 oz (47.9 kg)  Temp Readings from Last 3 Encounters:  08/09/18 98 F (36.7 C) (Oral)  07/25/18 98 F (36.7 C) (Oral)  07/21/18 98.6 F (37 C) (Oral)   BP Readings from Last 3 Encounters:  08/09/18 (!) 145/70  07/29/18 114/68  07/25/18 (!) 110/40   Pulse Readings from Last 3 Encounters:  08/09/18 (!) 111  07/29/18 78  07/25/18 85   Pain Assessment Pain Score: 0-No pain/10  In general this is a well appearing caucasian female in no acute distress. She is alert and oriented x4 and appropriate throughout the examination. HEENT reveals that the patient is normocephalic, atraumatic. EOMs are intact. Cardiovascular exam reveals a regular rate and rhythm, no clicks rubs or murmurs are auscultated. Chest is clear to auscultation bilaterally.    ECOG = 0  0 - Asymptomatic (Fully active, able to carry on all predisease activities without restriction)  1 - Symptomatic but completely ambulatory (Restricted in physically strenuous activity but ambulatory and able to carry out work of a light or sedentary nature. For example, light housework, office work)  2 - Symptomatic, <50% in bed during the day (Ambulatory and capable of all self care but unable to carry out any work activities. Up and about more than 50% of waking hours)  3 - Symptomatic, >50% in bed, but not bedbound (Capable of only limited self-care, confined to bed or chair 50% or more of waking hours)  4 - Bedbound (Completely disabled.  Cannot carry on any self-care. Totally confined to bed or chair)  5 - Death   Eustace Pen MM, Creech RH, Tormey DC, et al. [phone]). "Toxicity and response criteria of the [employer]". [place] Oncol. 5 (6): 649-55    LABORATORY DATA:  Lab Results  Component Value Date   WBC 8.1 07/25/2018   HGB 11.4 (L) 07/25/2018   HCT 35.4 (L) 07/25/2018   MCV 97.3 07/25/2018   PLT 242.0 07/25/2018   Lab Results  Component Value Date   NA 140 07/25/2018   K 4.3 07/25/2018   CL 94 (L) 07/25/2018   CO2 38 (H) 07/25/2018   Lab Results  Component Value Date   ALT 12 07/19/2018   AST 17 07/19/2018   ALKPHOS 41 07/19/2018   BILITOT 0.8 07/19/2018      RADIOGRAPHY: Ct Angio Chest Pe W And/or Wo Contrast  Result Date: 07/18/2018 CLINICAL DATA:  Short of breath, chest pain. EXAM: CT ANGIOGRAPHY CHEST WITH CONTRAST TECHNIQUE: Multidetector CT imaging of the chest was performed using the standard protocol during bolus administration of intravenous contrast. Multiplanar CT image reconstructions and MIPs were obtained to evaluate the vascular anatomy. CONTRAST:  72m ISOVUE-370 IOPAMIDOL (ISOVUE-370) INJECTION 76% COMPARISON:  CT 09/02/2015 FINDINGS: Cardiovascular: No filling defects within the pulmonary arteries to suggest acute pulmonary embolism. No acute findings of the aorta or great vessels. No pericardial fluid. Mediastinum/Nodes: No axillary or supraclavicular adenopathy. No mediastinal hilar adenopathy. No pericardial effusion. Esophagus normal Lungs/Pleura: Peripheral consolidation in the RIGHT upper lobe adjacent to a pathologic rib fracture measures 28 x 20 mm increased from 25 x 22 mm. Lesion is same shape as comparison exam although more thickened. Angular nodule in the RIGHT upper lobe adjacent to the consolidation measures 8 mm x 16 mm compared to 22 x 11. This nodule also appears slightly thickened compared to prior. No new pulmonary nodules. Centrilobular emphysema the  upper lobes. Upper Abdomen: Limited view of the liver, kidneys, pancreas are unremarkable. Normal adrenal glands. Musculoskeletal: Scoliosis of the  spine. Stable pathologic rib fracture anteriorly on the RIGHT second rib. Review of the MIP images confirms the above findings. IMPRESSION: 1. No evidence acute pulmonary embolism. 2. No acute finding of the aorta or great vessels. 3. Interval increase in size of RIGHT consolidative mass adjacent to a pathologic rib fracture. Favor progressive inflammatory process versus indolent carcinoma. 4. Adjacent angular RIGHT upper lobe nodule also slightly increased in thickness from 09/02/2015. Favored benign inflammatory thickening. Electronically Signed   By: Suzy Bouchard M.D.   On: 07/18/2018 18:59   Nm Pet Image Restag (ps) Skull Base To Thigh  Result Date: 08/08/2018 CLINICAL DATA:  Subsequent treatment strategy for lung carcinoma. Patient status post radius surgery for RIGHT upper lobe stage I lung cancer. EXAM: NUCLEAR MEDICINE PET SKULL BASE TO THIGH TECHNIQUE: 5.7 mCi F-18 FDG was injected intravenously. Full-ring PET imaging was performed from the skull base to thigh after the radiotracer. CT data was obtained and used for attenuation correction and anatomic localization. Fasting blood glucose: 149 mg/dl COMPARISON:  Chest CT 07/18/2018, PET-CT 03/14/2012 FINDINGS: Mediastinal blood pool activity: SUV max 1.65 NECK: No hypermetabolic lymph nodes in the neck. Incidental CT findings: none CHEST: A triangular consolidative process in the anterior aspect of the RIGHT upper lobe has low metabolic activity (SUV max equal 2.1) most consistent benign inflammatory process. Lesion has been present andincreased in size from 03/14/2012. Adjacent angular nodule in the RIGHT upper lobe also has low metabolic activity SUV max equal 0.6 (image 64). No pulmonary nodules significant metabolic activity. No hypermetabolic mediastinal lymph nodes. Incidental CT findings: Coronary  artery calcification and aortic atherosclerotic calcification. ABDOMEN/PELVIS: No abnormal hypermetabolic activity within the liver, pancreas, adrenal glands, or spleen. No hypermetabolic lymph nodes in the abdomen or pelvis. Incidental CT findings: Atherosclerotic calcification of the aorta. SKELETON: No focal hypermetabolic activity to suggest skeletal metastasis. Incidental CT findings: A stable pathologic fracture of the anterior right second rib IMPRESSION: 1. Very low metabolic activity associated with the RIGHT upper lobe lesions consistent with benign inflammatory processes. 2. No evidence of mediastinal metastatic adenopathy. 3. No evidence distant metastatic disease Electronically Signed   By: Suzy Bouchard M.D.   On: 08/08/2018 14:04   Dg Chest Portable 1 View  Result Date: 07/18/2018 CLINICAL DATA:  Chest pain EXAM: PORTABLE CHEST 1 VIEW COMPARISON:  None. FINDINGS: The lungs are hyperinflated likely secondary to COPD. There is blunting of the left costophrenic angle likely reflecting a trace pleural effusion versus chronic pleural thickening. There is a right upper lobe spiculated airspace opacity likely reflecting posttreatment changes with recurrent or residual neoplasm not excluded. There is no pneumothorax. There is stable cardiomegaly. There is thoracic aortic atherosclerosis. The osseous structures are unremarkable. IMPRESSION: Right upper lobe spiculated airspace opacity likely reflecting posttreatment changes with recurrent or residual neoplasm not excluded. COPD. Electronically Signed   By: Kathreen Devoid   On: 07/18/2018 16:07       IMPRESSION/PLAN: 1. History of Stage IA, T1cN0M0, NSCLC, adenocarcinoma of the RUL. The patient's imaging has been reviewed personally by myself and Dr. Lisbeth Renshaw. I reviewed her imaging from PET and recent CT and the findings appear consistent with post radiotherapy effect. We discussed that given the recent changes, it would be reasonable to repeat a CT in  6 months time, then if stable, return to annual low dose CT screening per NCCN. The patient is in agreement with this plan. She will meet with Dr. Valeta Harms this month and review her O2 needs. We would  be happy to order her scans or defer to Pulmonary. I'll check with Dr. Valeta Harms to see his preference. We'd be happy to see her back in the future regardless if there are questions or concerns, or could present her imaging in conference if there were concerns for recurrence in the future.   In a visit lasting 45 minutes, greater than 50% of the time was spent face to face discussing her case, and coordinating the patient's care.     Carola Rhine, PAC

## 2018-08-11 ENCOUNTER — Ambulatory Visit (INDEPENDENT_AMBULATORY_CARE_PROVIDER_SITE_OTHER): Payer: Medicare Other | Admitting: Pulmonary Disease

## 2018-08-11 ENCOUNTER — Ambulatory Visit: Payer: Medicare Other | Admitting: Endocrinology

## 2018-08-11 ENCOUNTER — Encounter: Payer: Self-pay | Admitting: Pulmonary Disease

## 2018-08-11 VITALS — BP 134/68 | HR 96 | Ht 65.0 in | Wt 99.4 lb

## 2018-08-11 DIAGNOSIS — J449 Chronic obstructive pulmonary disease, unspecified: Secondary | ICD-10-CM

## 2018-08-11 DIAGNOSIS — Z66 Do not resuscitate: Secondary | ICD-10-CM | POA: Insufficient documentation

## 2018-08-11 DIAGNOSIS — Z85118 Personal history of other malignant neoplasm of bronchus and lung: Secondary | ICD-10-CM

## 2018-08-11 DIAGNOSIS — J9611 Chronic respiratory failure with hypoxia: Secondary | ICD-10-CM

## 2018-08-11 NOTE — Patient Instructions (Signed)
Thank you for visiting Dr. Valeta Harms at [facility].  Today we recommend the following:  Continue your current respiratory regimen to include twice daily Pulmicort nebulizer as well as daily use of Stiolto. Please place one yellow durable DNR form on the back of your front door and one on your refrigerator. Would consider obtaining a DNR medical alert bracelet.  Please let us know if you have any changes in your respiratory symptoms.  Return in about 4 months (around 12/10/2018).

## 2018-08-11 NOTE — Progress Notes (Signed)
Synopsis: Referred in Jan 2020 for very severe COPD by Caren Macadam, MD  Subjective:   PATIENT ID: Charlotte Harris GENDER: female DOB: [birth year], MRN: [id number]  Chief Complaint  Patient presents with  . Consult    pt seen by Aaron Edelman for a HFU on 12/27.  pt states she is doing well, does note stable DOE.  Denies cough, mucus production.     PMH of very severe COPD, postbronchodilator FEV1 20% predicted, RUL T1N0M0 lung cancer, s/p radiation by Dr. Lisbeth Renshaw. Recently admitted to [facility] for COPD exacerbation.  She was subsequently seen in hospital follow-up by Wyn Quaker.  She is currently managed with Stiolto and twice daily nebulized Pulmicort.  Baseline functional status she is able to complete most of her activities of daily living on 2 L nasal cannula O2 and occasionally increases to 3 L.  She does have some shortness of breath at baseline.  It does take her some time to get ready in the morning.  She does have daily cough.  Overall since her hospitalization she has noticed a significant improvement.  When discussing her goals of care today in the office.  She informs me that she would never want to be placed on mechanical life support if possible.  I asked if anyone has ever discussed that with her.  She said she recently had discussion with her daughter about this.  She would like to continue to do all treatments to help her breathing and continued normal functional status but would not want aggressive measures.  She knows that she has severe lung disease.   Past Medical History:  Diagnosis Date  . ADENOCARCINOMA, LUNG 07/03/2010   RUL  . ASYMPTOMATIC POSTMENOPAUSAL STATUS 07/02/2009  . Breast cancer ([place]) 1991   R mastectomy, no chemo or radiation  . COLONIC POLYPS, HX OF 04/16/2007  . COPD 05/17/2008  . HEMATURIA, HX OF 04/16/2007  . History of radiation therapy 07/30/09 to 08/09/09   RUL lung  . HYPERLIPIDEMIA 04/16/2007  . Hypertension   . HYPERTENSION 04/16/2007  .  HYPOTHYROIDISM, POST-RADIATION 06/07/2008  . Osteoarthritis   . OSTEOPOROSIS 07/02/2009  . Wrist fracture, left      Family History  Problem Relation Age of Onset  . Heart attack Mother 37  . Heart disease Mother   . Cancer Sister        "throat" cancer  . Heart disease Father   . Cancer Brother        blood  . Heart disease Sister   . Cancer Brother        blood  . Heart disease Other        CAD  . Hypertension Other   . Colon cancer Neg Hx   . Stomach cancer Neg Hx      Past Surgical History:  Procedure Laterality Date  . BREAST SURGERY     lumpectomy  . EYE SURGERY Bilateral    cataracts  . 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 LUNG BIOPSY  05/22/2009   RUL lung=Adenocarcinoma  . MASTECTOMY  1991   right  . TONSILLECTOMY  1955    Social History   Socioeconomic History  . Marital status: Widowed    Spouse name: Not on file  . Number of children: Not on file  . Years of education: Not on file  . Highest education level: Not on file  Occupational History  . Occupation: Retired (worked Lexicographer)  Employer: RETIRED  Social Needs  . Financial resource strain: Not on file  . Food insecurity:    Worry: Not on file    Inability: Not on file  . Transportation needs:    Medical: No    Non-medical: No  Tobacco Use  . Smoking status: Former Smoker    Packs/day: 1.00    Years: 50.00    Pack years: 50.00    Types: Cigarettes    Last attempt to quit: 08/03/2009    Years since quitting: 9.0  . Smokeless tobacco: Never Used  Substance and Sexual Activity  . Alcohol use: Yes    Alcohol/week: 2.0 standard drinks    Types: 2 Glasses of wine per week  . Drug use: No  . Sexual activity: Not on file  Lifestyle  . Physical activity:    Days per week: Not on file    Minutes per session: Not on file  . Stress: Not on file  Relationships  . Social connections:    Talks on phone: Not on file    Gets together: Not on file     Attends religious service: Not on file    Active member of club or organization: Not on file    Attends meetings of clubs or organizations: Not on file    Relationship status: Not on file  . Intimate partner violence:    Fear of current or ex partner: Not on file    Emotionally abused: Not on file    Physically abused: Not on file    Forced sexual activity: Not on file  Other Topics Concern  . Not on file  Social History Narrative   Widowed 2005   Quit smoking 2010   No other exposures, no TB hx     No Known Allergies   Outpatient Medications Prior to Visit  Medication Sig Dispense Refill  . acetaminophen (TYLENOL) 500 MG tablet Take 500 mg by mouth every 6 (six) hours as needed for headache (pain).     . Albuterol Sulfate 108 (90 Base) MCG/ACT AEPB Inhale 1 puff into the lungs 4 (four) times daily as needed. (Patient taking differently: Inhale 1 puff into the lungs 4 (four) times daily as needed (shortness of breath/ wheezing). ) 1 each 5  . aspirin EC 81 MG tablet Take 81 mg by mouth at bedtime.    . budesonide (PULMICORT) 0.25 MG/2ML nebulizer solution Take 2 mLs (0.25 mg total) by nebulization 2 (two) times daily. 120 mL 0  . Calcium Carb-Cholecalciferol (CALCIUM 1000 + D PO) Take 2 tablets by mouth daily.    Marland Kitchen ipratropium-albuterol (DUONEB) 0.5-2.5 (3) MG/3ML SOLN Take 3 mLs by nebulization every 4 (four) hours as needed. 360 mL 0  . levothyroxine (SYNTHROID, LEVOTHROID) 50 MCG tablet TAKE 1 TABLET BY MOUTH EVERY DAY (Patient taking differently: Take 50 mcg by mouth daily before breakfast. ) 30 tablet 5  . losartan (COZAAR) 100 MG tablet TAKE 1 TABLET (100 MG TOTAL) BY MOUTH DAILY. (Patient taking differently: Take 100 mg by mouth daily. ) 90 tablet 0  . metFORMIN (GLUCOPHAGE-XR) 500 MG 24 hr tablet Take 1 tablet (500 mg total) by mouth daily. (Patient taking differently: Take 500 mg by mouth daily after supper. ) 270 tablet 3  . metoprolol succinate (TOPROL-XL) 25 MG 24 hr tablet  Take 1 tablet (25 mg total) by mouth daily. (Patient taking differently: Take 25 mg by mouth daily after supper. ) 90 tablet 3  . NIFEdipine (PROCARDIA-XL/NIFEDICAL-XL) 30 MG  24 hr tablet TAKE 1 TABLET BY MOUTH EVERY MORNING (Patient taking differently: Take 30 mg by mouth daily. ) 90 tablet 0  . Respiratory Therapy Supplies (NEBULIZER COMPRESSOR) KIT PLEASE DISPENSE 1 NEBULIZER MACHINE AND SUPPLIES DX: J44.9 1 each 0  . Tiotropium Bromide-Olodaterol (STIOLTO RESPIMAT) 2.5-2.5 MCG/ACT AERS Inhale 2 puffs into the lungs daily. 1 Inhaler 11  . SPIRIVA HANDIHALER 18 MCG inhalation capsule INHALE 1 CAPSULE VIA HANDIHALER ONCE DAILY AT THE SAME TIME EVERY DAY (Patient not taking: No sig reported) 90 capsule 3   No facility-administered medications prior to visit.     Review of Systems  Constitutional: Negative for chills, fever, malaise/fatigue and weight loss.  HENT: Negative for hearing loss, sore throat and tinnitus.   Eyes: Negative for blurred vision and double vision.  Respiratory: Positive for shortness of breath and wheezing. Negative for cough, hemoptysis, sputum production and stridor.   Cardiovascular: Negative for chest pain, palpitations, orthopnea, leg swelling and PND.  Gastrointestinal: Negative for abdominal pain, constipation, diarrhea, heartburn, nausea and vomiting.  Genitourinary: Negative for dysuria, hematuria and urgency.  Musculoskeletal: Negative for joint pain and myalgias.  Skin: Negative for itching and rash.  Neurological: Negative for dizziness, tingling, weakness and headaches.  Endo/Heme/Allergies: Negative for environmental allergies. Does not bruise/bleed easily.  Psychiatric/Behavioral: Negative for depression. The patient is not nervous/anxious and does not have insomnia.   All other systems reviewed and are negative.    Objective:  Physical Exam Vitals signs reviewed.  Constitutional:      General: She is not in acute distress.    Appearance: She is  well-developed.  HENT:     Head: Normocephalic and atraumatic.     Mouth/Throat:     Pharynx: No oropharyngeal exudate.  Eyes:     Conjunctiva/sclera: Conjunctivae normal.     Pupils: Pupils are equal, round, and reactive to light.  Neck:     Vascular: No JVD.     Trachea: No tracheal deviation.     Comments: Loss of supraclavicular fat Cardiovascular:     Rate and Rhythm: Normal rate and regular rhythm.     Heart sounds: S1 normal and S2 normal.     Comments: Distant heart tones Pulmonary:     Effort: No tachypnea or accessory muscle usage.     Breath sounds: No stridor. Decreased breath sounds (throughout all lung fields) present. No wheezing, rhonchi or rales.  Abdominal:     General: Bowel sounds are normal. There is no distension.     Palpations: Abdomen is soft.     Tenderness: There is no abdominal tenderness.  Musculoskeletal:        General: Deformity (muscle wasting ) present.  Skin:    General: Skin is warm and dry.     Capillary Refill: Capillary refill takes less than 2 seconds.     Findings: No rash.  Neurological:     Mental Status: She is alert and oriented to person, place, and time.  Psychiatric:        Behavior: Behavior normal.      Vitals:   08/11/18 1135  BP: 134/68  Pulse: 96  SpO2: 94%  Weight: 99 lb 6.4 oz (45.1 kg)  Height: '5\' 5"'$  (1.651 m)   94% on 2 LPM  BMI Readings from Last 3 Encounters:  08/11/18 16.54 kg/m  08/09/18 16.71 kg/m  07/29/18 17.31 kg/m   Wt Readings from Last 3 Encounters:  08/11/18 99 lb 6.4 oz (45.1 kg)  08/09/18 100 lb  6.4 oz (45.5 kg)  07/29/18 104 lb (47.2 kg)     CBC    Component Value Date/Time   WBC 8.1 07/25/2018 1207   RBC 3.64 (L) 07/25/2018 1207   HGB 11.4 (L) 07/25/2018 1207   HCT 35.4 (L) 07/25/2018 1207   PLT 242.0 07/25/2018 1207   MCV 97.3 07/25/2018 1207   MCH 30.9 07/20/2018 0241   MCHC 32.1 07/25/2018 1207   RDW 13.9 07/25/2018 1207   LYMPHSABS 0.5 (L) 07/25/2018 1207   MONOABS  0.2 07/25/2018 1207   EOSABS 0.0 07/25/2018 1207   BASOSABS 0.0 07/25/2018 1207    Chest Imaging:  08/08/2018 pet imaging. Low-level metabolic uptake within the right upper lobe consistent with benign inflammatory process. The patient's images have been independently reviewed by me.    Pulmonary Functions Testing Results: PFT Results Latest Ref Rng & Units 07/19/2018  FVC-Pre L 0.89  FVC-Predicted Pre % 33  Pre FEV1/FVC % % 45  FEV1-Pre L 0.40  FEV1-Predicted Pre % 20       Assessment & Plan:   Chronic hypoxemic respiratory failure (HCC)  Stage 4 very severe COPD by GOLD classification ([place])  History of lung cancer  DNR (do not resuscitate)  Discussion:  This is an 85 year old female with a very severe COPD, FEV1 20% predicted postbronchodilator response, longtime history of tobacco abuse.  History of right upper lobe stage I lung cancer status post radiation by Dr. Lisbeth Renshaw.  Recent imaging with a low level PET uptake to a nodular area consistent with post radiation changes.  I spent most of the office visit discussing the patient's goals of care and current management plan.  After revealing that she would no longer ever want to be placed on life support or to have CPR in the field if something was to happen at home she has decided to become a durable DNR.  We will recommend the following:  Today in the office we discussed the risk, benefits and alternatives of cardiopulmonary resuscitation.  As well as prolonged life support needs.  The patient has chosen to pursue a durable DNR status and would not want aggressive life saving measures.  She understands the severity of her lung disease and her unlikely chance at survival on mechanical life support if that was needed.  Therefore we will continue to manage her very severe COPD aggressively with triple therapy.  She needs to continue her twice daily nebulized steroid as well as her Stiolto.  Also recommend continuing her vitamin D  supplementation. She needs to currently remain on 2 L nasal cannula at rest and 3 L with exertion.  Goal O2 sats to maintain greater than 88%.  I do not think that she needs a hospice referral at this time for symptom management.  But in the future if we are unable to control her respiratory needs we may need to consider hospice involvement.  Our goal should be to maintain the patient's current respiratory status for as long as possible.  I do think if she needs antibiotics or steroids or even the use of NIPPV would be appropriate if it was short-term.  As for the upper lobe nodule from post radiation change after her history of cancer.  We can revisit at her next office visit in 4 months.  At that point we can consider a 71-monthfollow-up from her last image at the beginning of January.  To see if there has been any progression or change.  Greater than 50% of  this patient 60-minute office visit was spent face-to-face discussing the above recommendations and treatment plan as well as going over goals of care.    Current Outpatient Medications:  .  acetaminophen (TYLENOL) 500 MG tablet, Take 500 mg by mouth every 6 (six) hours as needed for headache (pain). , Disp: , Rfl:  .  Albuterol Sulfate 108 (90 Base) MCG/ACT AEPB, Inhale 1 puff into the lungs 4 (four) times daily as needed. (Patient taking differently: Inhale 1 puff into the lungs 4 (four) times daily as needed (shortness of breath/ wheezing). ), Disp: 1 each, Rfl: 5 .  aspirin EC 81 MG tablet, Take 81 mg by mouth at bedtime., Disp: , Rfl:  .  budesonide (PULMICORT) 0.25 MG/2ML nebulizer solution, Take 2 mLs (0.25 mg total) by nebulization 2 (two) times daily., Disp: 120 mL, Rfl: 0 .  Calcium Carb-Cholecalciferol (CALCIUM 1000 + D PO), Take 2 tablets by mouth daily., Disp: , Rfl:  .  ipratropium-albuterol (DUONEB) 0.5-2.5 (3) MG/3ML SOLN, Take 3 mLs by nebulization every 4 (four) hours as needed., Disp: 360 mL, Rfl: 0 .  levothyroxine  (SYNTHROID, LEVOTHROID) 50 MCG tablet, TAKE 1 TABLET BY MOUTH EVERY DAY (Patient taking differently: Take 50 mcg by mouth daily before breakfast. ), Disp: 30 tablet, Rfl: 5 .  losartan (COZAAR) 100 MG tablet, TAKE 1 TABLET (100 MG TOTAL) BY MOUTH DAILY. (Patient taking differently: Take 100 mg by mouth daily. ), Disp: 90 tablet, Rfl: 0 .  metFORMIN (GLUCOPHAGE-XR) 500 MG 24 hr tablet, Take 1 tablet (500 mg total) by mouth daily. (Patient taking differently: Take 500 mg by mouth daily after supper. ), Disp: 270 tablet, Rfl: 3 .  metoprolol succinate (TOPROL-XL) 25 MG 24 hr tablet, Take 1 tablet (25 mg total) by mouth daily. (Patient taking differently: Take 25 mg by mouth daily after supper. ), Disp: 90 tablet, Rfl: 3 .  NIFEdipine (PROCARDIA-XL/NIFEDICAL-XL) 30 MG 24 hr tablet, TAKE 1 TABLET BY MOUTH EVERY MORNING (Patient taking differently: Take 30 mg by mouth daily. ), Disp: 90 tablet, Rfl: 0 .  Respiratory Therapy Supplies (NEBULIZER COMPRESSOR) KIT, PLEASE DISPENSE 1 NEBULIZER MACHINE AND SUPPLIES DX: J44.9, Disp: 1 each, Rfl: 0 .  Tiotropium Bromide-Olodaterol (STIOLTO RESPIMAT) 2.5-2.5 MCG/ACT AERS, Inhale 2 puffs into the lungs daily., Disp: 1 Inhaler, Rfl: [place], DO [facility] 08/11/2018 12:08 PM

## 2018-09-12 ENCOUNTER — Other Ambulatory Visit: Payer: Self-pay | Admitting: Family Medicine

## 2018-09-12 NOTE — Telephone Encounter (Signed)
Copied from [place] [id number]. Topic: Quick Communication - Rx Refill/Question >> Sep 12, 2018 10:02 AM Rayann Heman wrote: Medication: budesonide (PULMICORT) 0.25 MG/2ML nebulizer solution [[id number]]  was sent by hospital   Has the patient contacted their pharmacy? yes Preferred Pharmacy (with phone number or street name): [id number] [address], [place] [phone] (Phone) [phone] (Fax)    Agent: Please be advised that RX refills may take up to 3 business days. We ask that you follow-up with your pharmacy.

## 2018-09-12 NOTE — Telephone Encounter (Signed)
Requested medication (s) are due for refill today -yes  Requested medication (s) are on the active medication list -yes  Future visit scheduled -no  Last refill: 07/21/18  Notes to clinic: Patient is requesting refill of Rx written by hospital provider. Sent for PCP review.  Requested Prescriptions  Pending Prescriptions Disp Refills   budesonide (PULMICORT) 0.25 MG/2ML nebulizer solution 120 mL 0    Sig: Take 2 mLs (0.25 mg total) by nebulization 2 (two) times daily for 30 days.     There is no refill protocol information for this order       Requested Prescriptions  Pending Prescriptions Disp Refills   budesonide (PULMICORT) 0.25 MG/2ML nebulizer solution 120 mL 0    Sig: Take 2 mLs (0.25 mg total) by nebulization 2 (two) times daily for 30 days.     There is no refill protocol information for this order

## 2018-09-12 NOTE — Telephone Encounter (Signed)
Last fill 07/21/18 by Dr. Reesa Chew Last OV 08/11/18  Ok to fill?

## 2018-09-13 ENCOUNTER — Other Ambulatory Visit: Payer: Self-pay | Admitting: Endocrinology

## 2018-09-14 MED ORDER — BUDESONIDE 0.25 MG/2ML IN SUSP: 0.2500 mg | Freq: Two times a day (BID) | RESPIRATORY_TRACT | 0 refills | Status: DC

## 2018-10-03 LAB — HEMOGLOBIN A1C: Hemoglobin A1C: 6

## 2018-10-03 LAB — HM DIABETES EYE EXAM

## 2018-10-07 ENCOUNTER — Other Ambulatory Visit: Payer: Self-pay | Admitting: Endocrinology

## 2018-10-08 ENCOUNTER — Other Ambulatory Visit: Payer: Self-pay | Admitting: Endocrinology

## 2018-10-09 ENCOUNTER — Other Ambulatory Visit: Payer: Self-pay | Admitting: Family Medicine

## 2018-10-09 ENCOUNTER — Other Ambulatory Visit: Payer: Self-pay | Admitting: Endocrinology

## 2018-10-14 ENCOUNTER — Encounter: Payer: Self-pay | Admitting: Family Medicine

## 2018-10-16 NOTE — Progress Notes (Deleted)
Charlotte Harris DOB: November 26, 1931 Encounter date: 10/17/2018  This is a 85 y.o. female who presents for a chronic condition visit.     No chief complaint on file.   HPI  Hypertension:  Home blood pressure monitoring: {NO/[phone]}.  She {is/is not:9024} adherent to a low sodium diet. Patient {denies/complains:31533} {Symptoms of Hypertension, Denies:22115::"chestpain","shortness of breath","peripheral edema","palpitations"}.  Antihypertensive medication side effects: {Hypertension med side effects:5728::"no medication side effects noted"}.  Use of agents associated withhypertension: {bp agents assoc with hypertension:511::"none"}.    A1C:No results found for: LABA1C Micro:  Lab Results  Component Value Date   CREATININE 0.69 07/25/2018   CREATININE 0.9 09/02/2015   CBC:  Lab Results  Component Value Date   WBC 8.1 07/25/2018   HGB 11.4 (L) 07/25/2018   HCT 35.4 (L) 07/25/2018   MCH 30.9 07/20/2018   MCHC 32.1 07/25/2018   RDW 13.9 07/25/2018   PLT 242.0 07/25/2018   CMP: Lab Results  Component Value Date   NA 140 07/25/2018   K 4.3 07/25/2018   CL 94 (L) 07/25/2018   CO2 38 (H) 07/25/2018   ANIONGAP 8 07/20/2018   GLUCOSE 90 07/25/2018   BUN 26 (H) 07/25/2018   BUN 19.9 09/02/2015   CREATININE 0.69 07/25/2018   CREATININE 0.9 09/02/2015   GFRAA NOT CALCULATED 07/20/2018   CALCIUM 9.8 07/25/2018   CALCIUM 9.6 07/05/2012   PROT 5.6 (L) 07/19/2018   BILITOT 0.8 07/19/2018   ALKPHOS 41 07/19/2018   ALT 12 07/19/2018   AST 17 07/19/2018   LIPID:  Lab Results  Component Value Date   CHOL 218 (H) 01/31/2018   TRIG 138.0 01/31/2018   HDL 70.80 01/31/2018   LDLCALC 119 (H) 01/31/2018       No Known Allergies No outpatient medications have been marked as taking for the 10/17/18 encounter (Appointment) with Caren Macadam, MD.     Review of Systems   Objective:  There were no vitals taken for this visit.      BP Readings from Last 3 Encounters:   08/11/18 134/68  08/09/18 (!) 145/70  07/29/18 114/68   Wt Readings from Last 3 Encounters:  08/11/18 99 lb 6.4 oz (45.1 kg)  08/09/18 100 lb 6.4 oz (45.5 kg)  07/29/18 104 lb (47.2 kg)    Physical Exam  Assessment/Plan: Health Maintenance Due  Topic Date Due  . FOOT EXAM  07/21/2018  . OPHTHALMOLOGY EXAM  09/23/2018    Health Maintenance reviewed. ***  Goals Addressed   None     There are no diagnoses linked to this encounter.  -continue current meds -reviewed labs -encouraged a healthy diet, regular exercise and maintaining a healthy weight  No follow-ups on file.  Micheline Rough, MD

## 2018-10-17 ENCOUNTER — Ambulatory Visit: Payer: Medicare Other | Admitting: Family Medicine

## 2018-10-20 ENCOUNTER — Other Ambulatory Visit: Payer: Self-pay | Admitting: Endocrinology

## 2018-10-20 ENCOUNTER — Encounter: Payer: Self-pay | Admitting: Family Medicine

## 2018-10-20 MED ORDER — METFORMIN HCL ER 500 MG PO TB24: ORAL_TABLET | ORAL | 0 refills | Status: DC

## 2018-10-20 NOTE — Telephone Encounter (Signed)
Please forward refill request to pt's new primary care provider.  

## 2018-10-20 NOTE — Telephone Encounter (Signed)
Is this refill appropriate or should this come from PCP please advise

## 2018-10-21 ENCOUNTER — Other Ambulatory Visit: Payer: Self-pay | Admitting: Family Medicine

## 2018-10-21 MED ORDER — LOSARTAN POTASSIUM 100 MG PO TABS: 100.0000 mg | ORAL_TABLET | Freq: Every day | ORAL | 1 refills | Status: DC

## 2018-10-21 MED ORDER — METFORMIN HCL ER 500 MG PO TB24: ORAL_TABLET | ORAL | 1 refills | Status: DC

## 2018-10-21 MED ORDER — NIFEDIPINE ER OSMOTIC RELEASE 30 MG PO TB24: 30.0000 mg | ORAL_TABLET | Freq: Every morning | ORAL | 1 refills | Status: DC

## 2018-11-01 ENCOUNTER — Telehealth: Payer: Self-pay

## 2018-11-01 NOTE — Telephone Encounter (Signed)
Patient's last OV was 07/25/18, she is due for a 3 month follow up. Appointment was scheduled for 11/18/2018. webex appointment made.

## 2018-11-03 ENCOUNTER — Other Ambulatory Visit: Payer: Self-pay | Admitting: Family Medicine

## 2018-11-08 ENCOUNTER — Encounter: Payer: Self-pay | Admitting: Family Medicine

## 2018-11-09 MED ORDER — LOSARTAN POTASSIUM 100 MG PO TABS: 100.0000 mg | ORAL_TABLET | Freq: Every day | ORAL | 1 refills | Status: DC

## 2018-11-13 ENCOUNTER — Other Ambulatory Visit: Payer: Self-pay | Admitting: Family Medicine

## 2018-11-18 ENCOUNTER — Ambulatory Visit: Payer: Medicare Other | Admitting: Family Medicine

## 2018-11-23 ENCOUNTER — Other Ambulatory Visit: Payer: Self-pay

## 2018-11-23 ENCOUNTER — Ambulatory Visit (INDEPENDENT_AMBULATORY_CARE_PROVIDER_SITE_OTHER): Payer: Medicare Other | Admitting: Family Medicine

## 2018-11-23 VITALS — BP 118/68 | Temp 98.5°F

## 2018-11-23 DIAGNOSIS — E890 Postprocedural hypothyroidism: Secondary | ICD-10-CM

## 2018-11-23 DIAGNOSIS — J439 Emphysema, unspecified: Secondary | ICD-10-CM | POA: Diagnosis not present

## 2018-11-23 DIAGNOSIS — E119 Type 2 diabetes mellitus without complications: Secondary | ICD-10-CM

## 2018-11-23 DIAGNOSIS — E1169 Type 2 diabetes mellitus with other specified complication: Secondary | ICD-10-CM | POA: Diagnosis not present

## 2018-11-23 DIAGNOSIS — I10 Essential (primary) hypertension: Secondary | ICD-10-CM

## 2018-11-23 DIAGNOSIS — E785 Hyperlipidemia, unspecified: Secondary | ICD-10-CM

## 2018-11-23 MED ORDER — LOSARTAN POTASSIUM 100 MG PO TABS: 100.0000 mg | ORAL_TABLET | Freq: Every day | ORAL | 1 refills | Status: DC

## 2018-11-23 NOTE — Progress Notes (Signed)
Virtual Visit via Video Note  I connected with@ on 11/23/18 at 10:00 AM EDT by a video enabled telemedicine application and verified that I am speaking with the correct person using two identifiers.  Location patient: home Location provider:work or home office Persons participating in the virtual visit: patient, provider  I discussed the limitations of evaluation and management by telemedicine and the availability of in person appointments. The patient expressed understanding and agreed to proceed.   Charlotte Harris DOB: 1932-03-10 Encounter date: 11/23/2018  This is a 85 y.o. female who presents with Chief Complaint  Patient presents with  . Follow-up    discuss losartan, pharmacy does not have this in stock, has not had since 4/8, has been using and old rx of atenolol '25mg'$  bid    History of present illness: Following w Dr. Valeta Harms for pulm needs: COPD stage 4: Question of lung mass noted on CT was thought to be radiotherapy effect and plans were to repeat CT chest in 6 months time (June 2020). Breathing has been stable. Using 2L O2 at home; 3L if out walking.   HPI  HTN:see today's bp below.  Hypothyroid: DM: (A1C from March was 6). Machine stopped working. Thinks she needs battery.   Last weight was 104. Slight swelling in ankles just if on her feet.   Sleeping really well.   No Known Allergies Current Meds  Medication Sig  . acetaminophen (TYLENOL) 500 MG tablet Take 500 mg by mouth every 6 (six) hours as needed for headache (pain).   . Albuterol Sulfate 108 (90 Base) MCG/ACT AEPB Inhale 1 puff into the lungs 4 (four) times daily as needed. (Patient taking differently: Inhale 1 puff into the lungs 4 (four) times daily as needed (shortness of breath/ wheezing). )  . aspirin EC 81 MG tablet Take 81 mg by mouth at bedtime.  . budesonide (PULMICORT) 0.25 MG/2ML nebulizer solution TAKE 2 MLS (0.25 MG TOTAL) BY NEBULIZATION 2 (TWO) TIMES DAILY FOR 30 DAYS.  . Calcium  Carb-Cholecalciferol (CALCIUM 1000 + D PO) Take 2 tablets by mouth daily.  Marland Kitchen levothyroxine (SYNTHROID, LEVOTHROID) 50 MCG tablet Take 1 tablet (50 mcg total) by mouth daily before breakfast.  . losartan (COZAAR) 100 MG tablet Take 1 tablet (100 mg total) by mouth daily.  . metFORMIN (GLUCOPHAGE-XR) 500 MG 24 hr tablet Take one tablet by mouth daily  . metoprolol succinate (TOPROL-XL) 25 MG 24 hr tablet Take 1 tablet (25 mg total) by mouth daily. (Patient taking differently: Take 25 mg by mouth daily after supper. )  . NIFEdipine (PROCARDIA-XL/NIFEDICAL-XL) 30 MG 24 hr tablet Take 1 tablet (30 mg total) by mouth every morning.  Marland Kitchen Respiratory Therapy Supplies (NEBULIZER COMPRESSOR) KIT PLEASE DISPENSE 1 NEBULIZER MACHINE AND SUPPLIES DX: J44.9  . Tiotropium Bromide-Olodaterol (STIOLTO RESPIMAT) 2.5-2.5 MCG/ACT AERS Inhale 2 puffs into the lungs daily.    Review of Systems  Constitutional: Negative for chills, fatigue and fever.  Respiratory: Negative for cough, chest tightness, shortness of breath (does well with the oxygen) and wheezing.   Cardiovascular: Negative for chest pain, palpitations and leg swelling.    Objective:  BP 118/68   Temp 98.5 F (36.9 C)   SpO2 98% Comment: on 2L      BP Readings from Last 3 Encounters:  11/23/18 118/68  08/11/18 134/68  08/09/18 (!) 145/70   Wt Readings from Last 3 Encounters:  08/11/18 99 lb 6.4 oz (45.1 kg)  08/09/18 100 lb 6.4 oz (45.5 kg)  07/29/18 104 lb (47.2 kg)    EXAM:  GENERAL: alert, oriented, appears well and in no acute distress  HEENT: atraumatic, conjunctiva clear, no obvious abnormalities on inspection of external nose and ears  NECK: normal movements of the head and neck  LUNGS: on inspection no signs of respiratory distress, breathing rate appears normal, no obvious gross SOB, gasping or wheezing  CV: no obvious cyanosis  MS: moves all visible extremities without noticeable abnormality  PSYCH/NEURO: pleasant and  cooperative, no obvious depression or anxiety, speech and thought processing grossly intact  Assessment/Plan:  1. Hyperlipidemia associated with type 2 diabetes mellitus ([place]) - Lipid panel; Future  2. Hypothyroidism following radioiodine therapy - TSH; Future  3. Pulmonary emphysema, unspecified emphysema type ([place]) Stable.  Following with pulmonology.  Currently on 2 L of oxygen at rest.  4. Hypertension, unspecified type Stable.  Continue current medications. - Comprehensive metabolic panel; Future - CBC with Differential/Platelet; Future  5. Diabetes mellitus type 2 in nonobese (HCC) Recent A1c was 6.  We will check further blood work.  Continue metformin. - Microalbumin / creatinine urine ratio; Future  Return for bloodwork in June; CCV to follow when needed.    I discussed the assessment and treatment plan with the patient. The patient was provided an opportunity to ask questions and all were answered. The patient agreed with the plan and demonstrated an understanding of the instructions.   The patient was advised to call back or seek an in-person evaluation if the symptoms worsen or if the condition fails to improve as anticipated.  Micheline Rough, MD

## 2018-12-05 ENCOUNTER — Ambulatory Visit: Payer: Medicare Other | Admitting: Pulmonary Disease

## 2018-12-17 ENCOUNTER — Other Ambulatory Visit: Payer: Self-pay | Admitting: Family Medicine

## 2019-01-22 ENCOUNTER — Other Ambulatory Visit: Payer: Self-pay | Admitting: Endocrinology

## 2019-01-23 ENCOUNTER — Other Ambulatory Visit (INDEPENDENT_AMBULATORY_CARE_PROVIDER_SITE_OTHER): Payer: Medicare Other

## 2019-01-23 ENCOUNTER — Other Ambulatory Visit: Payer: Self-pay

## 2019-01-23 DIAGNOSIS — E890 Postprocedural hypothyroidism: Secondary | ICD-10-CM | POA: Diagnosis not present

## 2019-01-23 DIAGNOSIS — E1169 Type 2 diabetes mellitus with other specified complication: Secondary | ICD-10-CM | POA: Diagnosis not present

## 2019-01-23 DIAGNOSIS — E785 Hyperlipidemia, unspecified: Secondary | ICD-10-CM | POA: Diagnosis not present

## 2019-01-23 DIAGNOSIS — E119 Type 2 diabetes mellitus without complications: Secondary | ICD-10-CM | POA: Diagnosis not present

## 2019-01-23 DIAGNOSIS — I10 Essential (primary) hypertension: Secondary | ICD-10-CM

## 2019-01-23 LAB — CBC WITH DIFFERENTIAL/PLATELET
Basophils Absolute: 0 10*3/uL (ref 0.0–0.1)
Basophils Relative: 0.5 % (ref 0.0–3.0)
Eosinophils Absolute: 0.2 10*3/uL (ref 0.0–0.7)
Eosinophils Relative: 3.3 % (ref 0.0–5.0)
HCT: 34.6 % — ABNORMAL LOW (ref 36.0–46.0)
Hemoglobin: 11.3 g/dL — ABNORMAL LOW (ref 12.0–15.0)
Lymphocytes Relative: 31.6 % (ref 12.0–46.0)
Lymphs Abs: 1.9 10*3/uL (ref 0.7–4.0)
MCHC: 32.8 g/dL (ref 30.0–36.0)
MCV: 95.6 fl (ref 78.0–100.0)
Monocytes Absolute: 0.7 10*3/uL (ref 0.1–1.0)
Monocytes Relative: 10.6 % (ref 3.0–12.0)
Neutro Abs: 3.3 10*3/uL (ref 1.4–7.7)
Neutrophils Relative %: 54 % (ref 43.0–77.0)
Platelets: 184 10*3/uL (ref 150.0–400.0)
RBC: 3.62 Mil/uL — ABNORMAL LOW (ref 3.87–5.11)
RDW: 13.9 % (ref 11.5–15.5)
WBC: 6.1 10*3/uL (ref 4.0–10.5)

## 2019-01-23 LAB — TSH: TSH: 0.95 u[IU]/mL (ref 0.35–4.50)

## 2019-01-23 NOTE — Telephone Encounter (Signed)
Please forward refill request to pt's new primary care provider.  

## 2019-01-24 LAB — COMPREHENSIVE METABOLIC PANEL
ALT: 6 U/L (ref 0–35)
AST: 14 U/L (ref 0–37)
Albumin: 4.6 g/dL (ref 3.5–5.2)
Alkaline Phosphatase: 39 U/L (ref 39–117)
BUN: 18 mg/dL (ref 6–23)
CO2: 34 mEq/L — ABNORMAL HIGH (ref 19–32)
Calcium: 9.9 mg/dL (ref 8.4–10.5)
Chloride: 98 mEq/L (ref 96–112)
Creatinine, Ser: 0.76 mg/dL (ref 0.40–1.20)
GFR: 72.34 mL/min (ref >60.00)
Glucose, Bld: 124 mg/dL — ABNORMAL HIGH (ref 70–99)
Potassium: 4.3 mEq/L (ref 3.5–5.1)
Sodium: 143 mEq/L (ref 135–145)
Total Bilirubin: 0.4 mg/dL (ref 0.2–1.2)
Total Protein: 6.7 g/dL (ref 6.0–8.3)

## 2019-01-24 LAB — MICROALBUMIN / CREATININE URINE RATIO
Creatinine,U: 64.7 mg/dL
Microalb Creat Ratio: 9.7 mg/g (ref 0.0–30.0)
Microalb, Ur: 6.3 mg/dL — ABNORMAL HIGH (ref 0.0–1.9)

## 2019-01-24 LAB — LIPID PANEL
Cholesterol: 242 mg/dL — ABNORMAL HIGH (ref 0–200)
HDL: 83 mg/dL (ref >39.00)
LDL Cholesterol: 139 mg/dL — ABNORMAL HIGH (ref 0–99)
NonHDL: 158.51
Total CHOL/HDL Ratio: 3
Triglycerides: 100 mg/dL (ref 0.0–149.0)
VLDL: 20 mg/dL (ref 0.0–40.0)

## 2019-01-30 ENCOUNTER — Encounter: Payer: Self-pay | Admitting: Family Medicine

## 2019-01-31 MED ORDER — METOPROLOL SUCCINATE ER 25 MG PO TB24: 25.0000 mg | ORAL_TABLET | Freq: Every day | ORAL | 0 refills | Status: DC

## 2019-02-01 ENCOUNTER — Encounter: Payer: Self-pay | Admitting: Pulmonary Disease

## 2019-02-01 ENCOUNTER — Other Ambulatory Visit: Payer: Self-pay

## 2019-02-01 ENCOUNTER — Ambulatory Visit (INDEPENDENT_AMBULATORY_CARE_PROVIDER_SITE_OTHER): Payer: Medicare Other | Admitting: Pulmonary Disease

## 2019-02-01 VITALS — BP 128/87 | HR 106 | Ht 65.0 in | Wt 105.0 lb

## 2019-02-01 DIAGNOSIS — Z923 Personal history of irradiation: Secondary | ICD-10-CM

## 2019-02-01 DIAGNOSIS — J439 Emphysema, unspecified: Secondary | ICD-10-CM | POA: Diagnosis not present

## 2019-02-01 DIAGNOSIS — C3411 Malignant neoplasm of upper lobe, right bronchus or lung: Secondary | ICD-10-CM

## 2019-02-01 DIAGNOSIS — Z66 Do not resuscitate: Secondary | ICD-10-CM

## 2019-02-01 DIAGNOSIS — J449 Chronic obstructive pulmonary disease, unspecified: Secondary | ICD-10-CM

## 2019-02-01 NOTE — Progress Notes (Signed)
Synopsis: Referred in Jan 2020 for very severe COPD by Caren Macadam, MD  Subjective:   PATIENT ID: Charlotte Harris GENDER: female DOB: 1931/12/09, MRN: [id number]  Chief Complaint  Patient presents with  . Follow-up    F/U re: Respiratory failure. She states her breathing has been good. She states her oxygen rearely drops below 97%. She states she paces herself and has no new concerns.     PMH of very severe COPD, postbronchodilator FEV1 20% predicted, RUL T1N0M0 lung cancer, s/p radiation by Dr. Lisbeth Renshaw. Recently admitted to [facility] for COPD exacerbation.  She was subsequently seen in hospital follow-up by Wyn Quaker.  She is currently managed with Stiolto and twice daily nebulized Pulmicort.  Baseline functional status she is able to complete most of her activities of daily living on 2 L nasal cannula O2 and occasionally increases to 3 L.  She does have some shortness of breath at baseline.  It does take her some time to get ready in the morning.  She does have daily cough.  Overall since her hospitalization she has noticed a significant improvement.  When discussing her goals of care today in the office.  She informs me that she would never want to be placed on mechanical life support if possible.  I asked if anyone has ever discussed that with her.  She said she recently had discussion with her daughter about this.  She would like to continue to do all treatments to help her breathing and continued normal functional status but would not want aggressive measures.  She knows that she has severe lung disease.  OV 02/01/2019: During her last office visit we discussed CODE STATUS and overall goals of care.  At the time she stated she would not want cardiopulmonary resuscitation.  DNR status was added to her problem list.  Today she is here for follow-up regarding her COPD.  Currently maintained on Stiolto and Pulmicort.  Patient had pet imaging in January 2020.  Revealed low level uptake within the  right upper lobe consistent with inflammatory changes. Today she is excited to share that her breathing is doing great! She is using her inhalers regularly and her HOT 3L pulse and 2L at rist. Denies fevers, chills, cough. Ordering groceries online and having them delivered.    Past Medical History:  Diagnosis Date  . ADENOCARCINOMA, LUNG 07/03/2010   RUL  . ASYMPTOMATIC POSTMENOPAUSAL STATUS 07/02/2009  . Breast cancer ([place]) 1991   R mastectomy, no chemo or radiation  . COLONIC POLYPS, HX OF 04/16/2007  . COPD 05/17/2008  . HEMATURIA, HX OF 04/16/2007  . History of radiation therapy 07/30/09 to 08/09/09   RUL lung  . HYPERLIPIDEMIA 04/16/2007  . Hypertension   . HYPERTENSION 04/16/2007  . HYPOTHYROIDISM, POST-RADIATION 06/07/2008  . Osteoarthritis   . OSTEOPOROSIS 07/02/2009  . Wrist fracture, left      Family History  Problem Relation Age of Onset  . Heart attack Mother 82  . Heart disease Mother   . Cancer Sister        "throat" cancer  . Heart disease Father   . Cancer Brother        blood  . Heart disease Sister   . Cancer Brother        blood  . Heart disease Other        CAD  . Hypertension Other   . Colon cancer Neg Hx   . Stomach cancer Neg Hx  Past Surgical History:  Procedure Laterality Date  . BREAST SURGERY     lumpectomy  . EYE SURGERY Bilateral    cataracts  . 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 LUNG BIOPSY  05/22/2009   RUL lung=Adenocarcinoma  . MASTECTOMY  1991   right  . TONSILLECTOMY  1955    Social History   Socioeconomic History  . Marital status: Widowed    Spouse name: Not on file  . Number of children: Not on file  . Years of education: Not on file  . Highest education level: Not on file  Occupational History  . Occupation: Retired (worked Lexicographer)    Employer: RETIRED  Social Needs  . Financial resource strain: Not on file  . Food insecurity    Worry: Not on file    Inability: Not on  file  . Transportation needs    Medical: No    Non-medical: No  Tobacco Use  . Smoking status: Former Smoker    Packs/day: 1.00    Years: 50.00    Pack years: 50.00    Types: Cigarettes    Quit date: 08/03/2009    Years since quitting: 9.5  . Smokeless tobacco: Never Used  Substance and Sexual Activity  . Alcohol use: Yes    Alcohol/week: 2.0 standard drinks    Types: 2 Glasses of wine per week  . Drug use: No  . Sexual activity: Not on file  Lifestyle  . Physical activity    Days per week: Not on file    Minutes per session: Not on file  . Stress: Not on file  Relationships  . Social Herbalist on phone: Not on file    Gets together: Not on file    Attends religious service: Not on file    Active member of club or organization: Not on file    Attends meetings of clubs or organizations: Not on file    Relationship status: Not on file  . Intimate partner violence    Fear of current or ex partner: Not on file    Emotionally abused: Not on file    Physically abused: Not on file    Forced sexual activity: Not on file  Other Topics Concern  . Not on file  Social History Narrative   Widowed 2005   Quit smoking 2010   No other exposures, no TB hx     No Known Allergies   Outpatient Medications Prior to Visit  Medication Sig Dispense Refill  . acetaminophen (TYLENOL) 500 MG tablet Take 500 mg by mouth every 6 (six) hours as needed for headache (pain).     . Albuterol Sulfate 108 (90 Base) MCG/ACT AEPB Inhale 1 puff into the lungs 4 (four) times daily as needed. (Patient taking differently: Inhale 1 puff into the lungs 4 (four) times daily as needed (shortness of breath/ wheezing). ) 1 each 5  . aspirin EC 81 MG tablet Take 81 mg by mouth at bedtime.    . Calcium Carb-Cholecalciferol (CALCIUM 1000 + D PO) Take 2 tablets by mouth daily.    Marland Kitchen levothyroxine (SYNTHROID, LEVOTHROID) 50 MCG tablet Take 1 tablet (50 mcg total) by mouth daily before breakfast. 90 tablet 3   . losartan (COZAAR) 100 MG tablet Take 1 tablet (100 mg total) by mouth daily. 30 tablet 1  . metFORMIN (GLUCOPHAGE-XR) 500 MG 24 hr tablet Take one tablet by mouth daily 90 tablet  1  . metoprolol succinate (TOPROL-XL) 25 MG 24 hr tablet Take 1 tablet (25 mg total) by mouth daily. (Patient taking differently: Take 25 mg by mouth daily after supper. ) 90 tablet 3  . metoprolol succinate (TOPROL-XL) 25 MG 24 hr tablet Take 1 tablet (25 mg total) by mouth daily. 30 tablet 0  . NIFEdipine (PROCARDIA-XL/NIFEDICAL-XL) 30 MG 24 hr tablet Take 1 tablet (30 mg total) by mouth every morning. 90 tablet 1  . Respiratory Therapy Supplies (NEBULIZER COMPRESSOR) KIT PLEASE DISPENSE 1 NEBULIZER MACHINE AND SUPPLIES DX: J44.9 1 each 0  . Tiotropium Bromide-Olodaterol (STIOLTO RESPIMAT) 2.5-2.5 MCG/ACT AERS Inhale 2 puffs into the lungs daily. 1 Inhaler 11  . budesonide (PULMICORT) 0.25 MG/2ML nebulizer solution TAKE 2 MLS (0.25 MG TOTAL) BY NEBULIZATION 2 (TWO) TIMES DAILY FOR 30 DAYS. 60 mL 1  . ipratropium-albuterol (DUONEB) 0.5-2.5 (3) MG/3ML SOLN Take 3 mLs by nebulization every 4 (four) hours as needed. 360 mL 0   No facility-administered medications prior to visit.     Review of Systems  Constitutional: Negative for chills, fever, malaise/fatigue and weight loss.  HENT: Negative for hearing loss, sore throat and tinnitus.   Eyes: Negative for blurred vision and double vision.  Respiratory: Positive for shortness of breath. Negative for cough, hemoptysis, sputum production, wheezing and stridor.   Cardiovascular: Negative for chest pain, palpitations, orthopnea, leg swelling and PND.  Gastrointestinal: Negative for abdominal pain, constipation, diarrhea, heartburn, nausea and vomiting.  Genitourinary: Negative for dysuria, hematuria and urgency.  Musculoskeletal: Negative for joint pain and myalgias.  Skin: Negative for itching and rash.  Neurological: Negative for dizziness, tingling, weakness and  headaches.  Endo/Heme/Allergies: Negative for environmental allergies. Does not bruise/bleed easily.  Psychiatric/Behavioral: Negative for depression. The patient is not nervous/anxious and does not have insomnia.   All other systems reviewed and are negative.    Objective:  Physical Exam Vitals signs reviewed.  Constitutional:      General: She is not in acute distress.    Appearance: She is well-developed.  HENT:     Head: Normocephalic and atraumatic.     Mouth/Throat:     Pharynx: No oropharyngeal exudate.  Eyes:     Conjunctiva/sclera: Conjunctivae normal.     Pupils: Pupils are equal, round, and reactive to light.  Neck:     Vascular: No JVD.     Trachea: No tracheal deviation.     Comments: Loss of supraclavicular fat Cardiovascular:     Rate and Rhythm: Normal rate and regular rhythm.     Heart sounds: S1 normal and S2 normal.     Comments: Distant heart tones Pulmonary:     Effort: No tachypnea or accessory muscle usage.     Breath sounds: No stridor. Decreased breath sounds (throughout all lung fields) present. No wheezing, rhonchi or rales.     Comments: diminshed breath bilaterally Abdominal:     General: Bowel sounds are normal. There is no distension.     Palpations: Abdomen is soft.     Tenderness: There is no abdominal tenderness.  Musculoskeletal:        General: Deformity (muscle wasting ) present.  Skin:    General: Skin is warm and dry.     Capillary Refill: Capillary refill takes less than 2 seconds.     Findings: No rash.  Neurological:     Mental Status: She is alert and oriented to person, place, and time.  Psychiatric:        Behavior:  Behavior normal.      Vitals:   02/01/19 1046  BP: 128/87  Pulse: (!) 106  SpO2: 96%  Weight: 105 lb (47.6 kg)  Height: '5\' 5"'$  (1.651 m)   96% on 2 LPM  BMI Readings from Last 3 Encounters:  02/01/19 17.47 kg/m  08/11/18 16.54 kg/m  08/09/18 16.71 kg/m   Wt Readings from Last 3 Encounters:   02/01/19 105 lb (47.6 kg)  08/11/18 99 lb 6.4 oz (45.1 kg)  08/09/18 100 lb 6.4 oz (45.5 kg)     CBC    Component Value Date/Time   WBC 6.1 01/23/2019 1036   RBC 3.62 (L) 01/23/2019 1036   HGB 11.3 (L) 01/23/2019 1036   HCT 34.6 (L) 01/23/2019 1036   PLT 184.0 01/23/2019 1036   MCV 95.6 01/23/2019 1036   MCH 30.9 07/20/2018 0241   MCHC 32.8 01/23/2019 1036   RDW 13.9 01/23/2019 1036   LYMPHSABS 1.9 01/23/2019 1036   MONOABS 0.7 01/23/2019 1036   EOSABS 0.2 01/23/2019 1036   BASOSABS 0.0 01/23/2019 1036    Chest Imaging:  07/18/2018: CT scan of the chest IMPRESSION: 1. No evidence acute pulmonary embolism. 2. No acute finding of the aorta or great vessels. 3. Interval increase in size of RIGHT consolidative mass adjacent to a pathologic rib fracture. Favor progressive inflammatory process versus indolent carcinoma. 4. Adjacent angular RIGHT upper lobe nodule also slightly increased in thickness from 09/02/2015. Favored benign inflammatory thickening.  08/08/2018 pet imaging. Low-level metabolic uptake within the right upper lobe consistent with benign inflammatory process. The patient's images have been independently reviewed by me.    Pulmonary Functions Testing Results: PFT Results Latest Ref Rng & Units 07/19/2018  FVC-Pre L 0.89  FVC-Predicted Pre % 33  Pre FEV1/FVC % % 45  FEV1-Pre L 0.40  FEV1-Predicted Pre % 20       Assessment & Plan:      ICD-10-CM   1. Pulmonary emphysema, unspecified emphysema type ([employer])  J43.9   2. Stage 4 very severe COPD by GOLD classification ([place])  J44.9   3. Malignant neoplasm of right upper lobe of lung (HCC)  C34.11   4. DNR (do not resuscitate)  Z66   5. History of radiation therapy  Z92.3     Discussion:  This is an 85 year old female with very severe COPD, gold 4, FEV1 20% predicted postbronchodilator response with a long-term history of tobacco abuse, history of a right upper lobe stage I lung cancer status post  radiation by Dr. Lisbeth Renshaw now with CT/radiographic evidence of post radiation scarring and fibrosis to the right upper lobe.  Overall we will continue her current medication regimen to include Stiolto and as needed albuterol.  She needs to continue her home exercise regimen.  This includes mainly keeping up with her housework.  She does this as well as a chair exercise routine that she and a group of ladies at her church have practiced before.  Otherwise she is staying home as much as possible and trying to abide by all of the 3W's in an effort to prevent contraction with COVID-19.   To return to clinic in 6 months or as needed if symptoms worsen.  Okay to send in refills if she calls for these regarding her inhaler regimen.  Greater than 50% of this patient's 15-minute office was been face-to-face discussing the recommendation treatment plan.    Current Outpatient Medications:  .  acetaminophen (TYLENOL) 500 MG tablet, Take 500 mg by mouth  every 6 (six) hours as needed for headache (pain). , Disp: , Rfl:  .  Albuterol Sulfate 108 (90 Base) MCG/ACT AEPB, Inhale 1 puff into the lungs 4 (four) times daily as needed. (Patient taking differently: Inhale 1 puff into the lungs 4 (four) times daily as needed (shortness of breath/ wheezing). ), Disp: 1 each, Rfl: 5 .  aspirin EC 81 MG tablet, Take 81 mg by mouth at bedtime., Disp: , Rfl:  .  Calcium Carb-Cholecalciferol (CALCIUM 1000 + D PO), Take 2 tablets by mouth daily., Disp: , Rfl:  .  levothyroxine (SYNTHROID, LEVOTHROID) 50 MCG tablet, Take 1 tablet (50 mcg total) by mouth daily before breakfast., Disp: 90 tablet, Rfl: 3 .  losartan (COZAAR) 100 MG tablet, Take 1 tablet (100 mg total) by mouth daily., Disp: 30 tablet, Rfl: 1 .  metFORMIN (GLUCOPHAGE-XR) 500 MG 24 hr tablet, Take one tablet by mouth daily, Disp: 90 tablet, Rfl: 1 .  metoprolol succinate (TOPROL-XL) 25 MG 24 hr tablet, Take 1 tablet (25 mg total) by mouth daily. (Patient taking  differently: Take 25 mg by mouth daily after supper. ), Disp: 90 tablet, Rfl: 3 .  metoprolol succinate (TOPROL-XL) 25 MG 24 hr tablet, Take 1 tablet (25 mg total) by mouth daily., Disp: 30 tablet, Rfl: 0 .  NIFEdipine (PROCARDIA-XL/NIFEDICAL-XL) 30 MG 24 hr tablet, Take 1 tablet (30 mg total) by mouth every morning., Disp: 90 tablet, Rfl: 1 .  Respiratory Therapy Supplies (NEBULIZER COMPRESSOR) KIT, PLEASE DISPENSE 1 NEBULIZER MACHINE AND SUPPLIES DX: J44.9, Disp: 1 each, Rfl: 0 .  Tiotropium Bromide-Olodaterol (STIOLTO RESPIMAT) 2.5-2.5 MCG/ACT AERS, Inhale 2 puffs into the lungs daily., Disp: 1 Inhaler, Rfl: 11 .  budesonide (PULMICORT) 0.25 MG/2ML nebulizer solution, TAKE 2 MLS (0.25 MG TOTAL) BY NEBULIZATION 2 (TWO) TIMES DAILY FOR 30 DAYS., Disp: 60 mL, Rfl: 1 .  ipratropium-albuterol (DUONEB) 0.5-2.5 (3) MG/3ML SOLN, Take 3 mLs by nebulization every 4 (four) hours as needed., Disp: 360 mL, Rfl: 0   Garner Nash, DO [facility] 02/01/2019 10:50 AM

## 2019-02-01 NOTE — Patient Instructions (Addendum)
Thank you for visiting Dr. Valeta Harms at [facility]. Today we recommend the following:  Please continue current inhaler regimen: stioloto and albuterol as needed Please continue your home exercise routine!  Return in about 6 months (around 08/04/2019), or if symptoms worsen or fail to improve.

## 2019-02-17 ENCOUNTER — Encounter: Payer: Self-pay | Admitting: Family Medicine

## 2019-02-23 ENCOUNTER — Other Ambulatory Visit: Payer: Self-pay | Admitting: Adult Health

## 2019-02-23 MED ORDER — PRAVASTATIN SODIUM 10 MG PO TABS: 10.0000 mg | ORAL_TABLET | Freq: Every day | ORAL | 1 refills | Status: DC

## 2019-02-23 NOTE — Progress Notes (Signed)
lipi

## 2019-02-23 NOTE — Addendum Note (Signed)
Addended by: Agnes Lawrence on: 02/23/2019 04:45 PM   Modules accepted: Orders

## 2019-04-15 ENCOUNTER — Other Ambulatory Visit: Payer: Self-pay | Admitting: Family Medicine

## 2019-05-18 ENCOUNTER — Telehealth: Payer: Self-pay | Admitting: *Deleted

## 2019-05-18 NOTE — Telephone Encounter (Signed)
Copied from [place] [id number]. Topic: Appointment Scheduling - Scheduling Inquiry for Clinic >> May 18, 2019 11:34 AM Rayann Heman wrote: Reason for CRM: pt called and stated that she has labs on the 23rd of October and would like to know if she can get her flu shot at the same time. Please advise

## 2019-05-19 NOTE — Telephone Encounter (Signed)
Patient can get flu shot the same day. Appointment notes have been updated and the patient is aware. Nothing further needed.

## 2019-05-22 ENCOUNTER — Other Ambulatory Visit: Payer: Self-pay | Admitting: Endocrinology

## 2019-05-22 NOTE — Telephone Encounter (Signed)
Per Dr. Loanne Drilling, I am forwarding this refill request to you. Please review and refill if appropriate

## 2019-05-22 NOTE — Telephone Encounter (Signed)
Please advise 

## 2019-05-22 NOTE — Telephone Encounter (Signed)
Please forward refill request to pt's primary care provider.   

## 2019-05-25 DIAGNOSIS — J9610 Chronic respiratory failure, unspecified whether with hypoxia or hypercapnia: Secondary | ICD-10-CM | POA: Diagnosis not present

## 2019-05-26 ENCOUNTER — Other Ambulatory Visit (INDEPENDENT_AMBULATORY_CARE_PROVIDER_SITE_OTHER): Payer: Medicare Other

## 2019-05-26 ENCOUNTER — Other Ambulatory Visit: Payer: Self-pay

## 2019-05-26 DIAGNOSIS — Z23 Encounter for immunization: Secondary | ICD-10-CM | POA: Diagnosis not present

## 2019-05-26 DIAGNOSIS — E1169 Type 2 diabetes mellitus with other specified complication: Secondary | ICD-10-CM | POA: Diagnosis not present

## 2019-05-26 DIAGNOSIS — E785 Hyperlipidemia, unspecified: Secondary | ICD-10-CM

## 2019-05-26 LAB — LIPID PANEL
Cholesterol: 257 mg/dL — ABNORMAL HIGH (ref 0–200)
HDL: 83.8 mg/dL (ref >39.00)
LDL Cholesterol: 154 mg/dL — ABNORMAL HIGH (ref 0–99)
NonHDL: 172.95
Total CHOL/HDL Ratio: 3
Triglycerides: 94 mg/dL (ref 0.0–149.0)
VLDL: 18.8 mg/dL (ref 0.0–40.0)

## 2019-06-05 MED ORDER — PRAVASTATIN SODIUM 20 MG PO TABS: 20.0000 mg | ORAL_TABLET | Freq: Every day | ORAL | 1 refills | Status: DC

## 2019-06-08 ENCOUNTER — Other Ambulatory Visit: Payer: Self-pay | Admitting: Family Medicine

## 2019-06-08 DIAGNOSIS — E119 Type 2 diabetes mellitus without complications: Secondary | ICD-10-CM

## 2019-06-08 DIAGNOSIS — E1169 Type 2 diabetes mellitus with other specified complication: Secondary | ICD-10-CM

## 2019-06-08 DIAGNOSIS — E890 Postprocedural hypothyroidism: Secondary | ICD-10-CM

## 2019-06-08 DIAGNOSIS — I10 Essential (primary) hypertension: Secondary | ICD-10-CM

## 2019-06-25 ENCOUNTER — Other Ambulatory Visit: Payer: Self-pay | Admitting: Family Medicine

## 2019-06-25 DIAGNOSIS — J9610 Chronic respiratory failure, unspecified whether with hypoxia or hypercapnia: Secondary | ICD-10-CM | POA: Diagnosis not present

## 2019-06-26 ENCOUNTER — Other Ambulatory Visit: Payer: Self-pay | Admitting: Family Medicine

## 2019-07-21 ENCOUNTER — Other Ambulatory Visit: Payer: Self-pay | Admitting: Pulmonary Disease

## 2019-07-25 ENCOUNTER — Telehealth: Payer: Self-pay | Admitting: Pulmonary Disease

## 2019-07-25 DIAGNOSIS — J9610 Chronic respiratory failure, unspecified whether with hypoxia or hypercapnia: Secondary | ICD-10-CM | POA: Diagnosis not present

## 2019-07-25 NOTE — Telephone Encounter (Signed)
07/25/2019 0804  Patient is due for follow-up with our office.  Please schedule patient for a follow-up appointment with Dr. Valeta Harms sometime over the next 6 to 8 weeks.  Wyn Quaker, FNP

## 2019-07-25 NOTE — Telephone Encounter (Signed)
lmtcb X1 for pt to schedule rov. Routing to [place] for follow-up as this was not generated in triage.

## 2019-07-31 NOTE — Telephone Encounter (Signed)
Called and scheduled patient with Dr. Valeta Harms on 08/30/2019-pr

## 2019-08-08 ENCOUNTER — Encounter: Payer: Self-pay | Admitting: Family Medicine

## 2019-08-24 ENCOUNTER — Other Ambulatory Visit: Payer: Self-pay | Admitting: Family Medicine

## 2019-08-25 DIAGNOSIS — J9610 Chronic respiratory failure, unspecified whether with hypoxia or hypercapnia: Secondary | ICD-10-CM | POA: Diagnosis not present

## 2019-08-26 ENCOUNTER — Ambulatory Visit: Payer: Medicare Other | Attending: Internal Medicine

## 2019-08-26 DIAGNOSIS — Z23 Encounter for immunization: Secondary | ICD-10-CM | POA: Insufficient documentation

## 2019-08-26 NOTE — Progress Notes (Signed)
   Covid-19 Vaccination Clinic  Name:  Charlotte Harris    MRN: [id number] DOB: 1932-07-10  08/26/2019  [name] was observed post Covid-19 immunization for 15 minutes without incidence. She was provided with Vaccine Information Sheet and instruction to access the V-Safe system.   [name] was instructed to call 911 with any severe reactions post vaccine: Marland Kitchen Difficulty breathing  . Swelling of your face and throat  . A fast heartbeat  . A bad rash all over your body  . Dizziness and weakness    Immunizations Administered    Name Date Dose VIS Date Route   Pfizer COVID-19 Vaccine 08/26/2019 12:17 PM 0.3 mL 07/14/2019 Intramuscular   Manufacturer: [place]   Lot: [id number]   [place]: [id number]

## 2019-08-30 ENCOUNTER — Encounter: Payer: Self-pay | Admitting: Pulmonary Disease

## 2019-08-30 ENCOUNTER — Other Ambulatory Visit: Payer: Self-pay

## 2019-08-30 ENCOUNTER — Other Ambulatory Visit: Payer: Self-pay | Admitting: Family Medicine

## 2019-08-30 ENCOUNTER — Ambulatory Visit (INDEPENDENT_AMBULATORY_CARE_PROVIDER_SITE_OTHER): Payer: Medicare Other | Admitting: Pulmonary Disease

## 2019-08-30 DIAGNOSIS — Z66 Do not resuscitate: Secondary | ICD-10-CM | POA: Diagnosis not present

## 2019-08-30 DIAGNOSIS — J9611 Chronic respiratory failure with hypoxia: Secondary | ICD-10-CM

## 2019-08-30 DIAGNOSIS — Z923 Personal history of irradiation: Secondary | ICD-10-CM

## 2019-08-30 DIAGNOSIS — J449 Chronic obstructive pulmonary disease, unspecified: Secondary | ICD-10-CM | POA: Diagnosis not present

## 2019-08-30 NOTE — Progress Notes (Signed)
Virtual Visit via Telephone Note  I connected with Charlotte Harris on 08/30/19 at 11:15 AM EST by telephone and verified that I am speaking with the correct person using two identifiers.  Location: Patient: Charlotte Nash, Charlotte Harris  Provider: Garner Nash, Charlotte Harris    I discussed the limitations, risks, security and privacy concerns of performing an evaluation and management service by telephone and the availability of in person appointments. I also discussed with the patient that there may be a patient responsible charge related to this service. The patient expressed understanding and agreed to proceed.  History of Present Illness:  PMH of very severe COPD, postbronchodilator FEV1 20% predicted, RUL T1N0M0 lung cancer, s/p radiation by Dr. Lisbeth Renshaw. Recently admitted to [facility] for COPD exacerbation.  She was subsequently seen in hospital follow-up by Wyn Quaker.  She is currently managed with Stiolto and twice daily nebulized Pulmicort.  Baseline functional status she is able to complete most of her activities of daily living on 2 L nasal cannula O2 and occasionally increases to 3 L.  She does have some shortness of breath at baseline.  It does take her some time to get ready in the morning.  She does have daily cough.  Overall since her hospitalization she has noticed a significant improvement.  When discussing her goals of care today in the office.  She informs me that she would never want to be placed on mechanical life support if possible.  I asked if anyone has ever discussed that with her.  She said she recently had discussion with her daughter about this.  She would like to continue to Charlotte Harris all treatments to help her breathing and continued normal functional status but would not want aggressive measures.  She knows that she has severe lung disease.  OV 02/01/2019: During her last office visit we discussed CODE STATUS and overall goals of care.  At the time she stated she would not want cardiopulmonary  resuscitation.  DNR status was added to her problem list.  Today she is here for follow-up regarding her COPD.  Currently maintained on Stiolto and Pulmicort.  Patient had pet imaging in January 2020.  Revealed low level uptake within the right upper lobe consistent with inflammatory changes. Today she is excited to share that her breathing is doing great! She is using her inhalers regularly and her HOT 3L pulse and 2L at rist. Denies fevers, chills, cough. Ordering groceries online and having them delivered.   OV 08/30/2019: Patient called today via telephone.  Patient doing well current inhaler regimen.  Has no significant respiratory changes.  She was able to get out and have her first Covid vaccine.  She did have to wait in line for nearly an hour for this.  She did state during this time she felt much more short of breath with having to stay standing the whole time.  She was also needing more oxygen support during that.  She also bringing her oxygen tank with her.  She is wondering if she would qualify potentially for a POC.  We discussed this today on the phone.   Observations/Objective: Patient is comfortable. Able to speak in complete sentences.  No audible wheezing  Assessment and Plan: Severe COPD  Chronic hypoxemic respiratory failure Oxygen dependent respiratory failure DNR  Plan: Recommendations for a nursing appointment for [place] qualification. Okay to place DME orders if patient qualifies for POC. Continue current inhaler regimen Follow-up to obtain second dose of Covid vaccine  Follow  Up Instructions:  1 year or as needed.   I discussed the assessment and treatment plan with the patient. The patient was provided an opportunity to ask questions and all were answered. The patient agreed with the plan and demonstrated an understanding of the instructions.   The patient was advised to call back or seek an in-person evaluation if the symptoms worsen or if the condition fails to  improve as anticipated.  I provided 22 minutes of non-face-to-face time during this encounter.   Charlotte Nash, Charlotte Harris

## 2019-08-30 NOTE — Patient Instructions (Signed)
Thank you for visiting Dr. Valeta Harms at [facility]. Today we recommend the following:  Please get next Covid vaccine as scheduled. Schedule nursing appointment for [place] qualification. Okay to place DME orders for POC if patient qualifies. Continue current inhaler regimen.  Return in about 1 year (around 08/29/2020).    Please do your part to reduce the spread of COVID-19.

## 2019-09-04 ENCOUNTER — Ambulatory Visit: Payer: Medicare Other

## 2019-09-04 ENCOUNTER — Other Ambulatory Visit: Payer: Self-pay

## 2019-09-05 ENCOUNTER — Other Ambulatory Visit (INDEPENDENT_AMBULATORY_CARE_PROVIDER_SITE_OTHER): Payer: Medicare Other

## 2019-09-05 DIAGNOSIS — E1169 Type 2 diabetes mellitus with other specified complication: Secondary | ICD-10-CM

## 2019-09-05 DIAGNOSIS — D649 Anemia, unspecified: Secondary | ICD-10-CM

## 2019-09-05 DIAGNOSIS — E890 Postprocedural hypothyroidism: Secondary | ICD-10-CM

## 2019-09-05 DIAGNOSIS — E119 Type 2 diabetes mellitus without complications: Secondary | ICD-10-CM

## 2019-09-05 DIAGNOSIS — E785 Hyperlipidemia, unspecified: Secondary | ICD-10-CM

## 2019-09-05 DIAGNOSIS — I10 Essential (primary) hypertension: Secondary | ICD-10-CM

## 2019-09-05 LAB — CBC WITH DIFFERENTIAL/PLATELET
Basophils Absolute: 0 10*3/uL (ref 0.0–0.1)
Basophils Relative: 0.6 % (ref 0.0–3.0)
Eosinophils Absolute: 0.1 10*3/uL (ref 0.0–0.7)
Eosinophils Relative: 2.1 % (ref 0.0–5.0)
HCT: 31.9 % — ABNORMAL LOW (ref 36.0–46.0)
Hemoglobin: 10.5 g/dL — ABNORMAL LOW (ref 12.0–15.0)
Lymphocytes Relative: 21.5 % (ref 12.0–46.0)
Lymphs Abs: 1.1 10*3/uL (ref 0.7–4.0)
MCHC: 32.8 g/dL (ref 30.0–36.0)
MCV: 95.8 fl (ref 78.0–100.0)
Monocytes Absolute: 0.4 10*3/uL (ref 0.1–1.0)
Monocytes Relative: 8.4 % (ref 3.0–12.0)
Neutro Abs: 3.5 10*3/uL (ref 1.4–7.7)
Neutrophils Relative %: 67.4 % (ref 43.0–77.0)
Platelets: 179 10*3/uL (ref 150.0–400.0)
RBC: 3.33 Mil/uL — ABNORMAL LOW (ref 3.87–5.11)
RDW: 14 % (ref 11.5–15.5)
WBC: 5.2 10*3/uL (ref 4.0–10.5)

## 2019-09-05 LAB — COMPREHENSIVE METABOLIC PANEL
ALT: 6 U/L (ref 0–35)
AST: 15 U/L (ref 0–37)
Albumin: 4.5 g/dL (ref 3.5–5.2)
Alkaline Phosphatase: 42 U/L (ref 39–117)
BUN: 22 mg/dL (ref 6–23)
CO2: 38 mEq/L — ABNORMAL HIGH (ref 19–32)
Calcium: 10 mg/dL (ref 8.4–10.5)
Chloride: 98 mEq/L (ref 96–112)
Creatinine, Ser: 0.68 mg/dL (ref 0.40–1.20)
GFR: 82.12 mL/min (ref >60.00)
Glucose, Bld: 119 mg/dL — ABNORMAL HIGH (ref 70–99)
Potassium: 3.9 mEq/L (ref 3.5–5.1)
Sodium: 142 mEq/L (ref 135–145)
Total Bilirubin: 0.5 mg/dL (ref 0.2–1.2)
Total Protein: 7 g/dL (ref 6.0–8.3)

## 2019-09-05 LAB — LIPID PANEL
Cholesterol: 190 mg/dL (ref 0–200)
HDL: 85.6 mg/dL (ref >39.00)
LDL Cholesterol: 90 mg/dL (ref 0–99)
NonHDL: 104.27
Total CHOL/HDL Ratio: 2
Triglycerides: 70 mg/dL (ref 0.0–149.0)
VLDL: 14 mg/dL (ref 0.0–40.0)

## 2019-09-05 LAB — HEMOGLOBIN A1C: Hgb A1c MFr Bld: 5.7 % (ref 4.6–6.5)

## 2019-09-05 LAB — TSH: TSH: 1.18 u[IU]/mL (ref 0.35–4.50)

## 2019-09-07 ENCOUNTER — Other Ambulatory Visit (INDEPENDENT_AMBULATORY_CARE_PROVIDER_SITE_OTHER): Payer: Medicare Other

## 2019-09-07 DIAGNOSIS — D649 Anemia, unspecified: Secondary | ICD-10-CM

## 2019-09-07 LAB — FERRITIN: Ferritin: 21.1 ng/mL (ref 10.0–291.0)

## 2019-09-07 LAB — IBC PANEL
Iron: 67 ug/dL (ref 42–145)
Saturation Ratios: 16.4 % — ABNORMAL LOW (ref 20.0–50.0)
Transferrin: 292 mg/dL (ref 212.0–360.0)

## 2019-09-16 ENCOUNTER — Ambulatory Visit: Payer: Medicare Other | Attending: Internal Medicine

## 2019-09-16 DIAGNOSIS — Z23 Encounter for immunization: Secondary | ICD-10-CM

## 2019-09-16 NOTE — Progress Notes (Signed)
   Covid-19 Vaccination Clinic  Name:  Charlotte Harris    MRN: [id number] DOB: Mar 24, 1932  09/16/2019  [name] was observed post Covid-19 immunization for 15 minutes without incidence. She was provided with Vaccine Information Sheet and instruction to access the V-Safe system.   [name] was instructed to call 911 with any severe reactions post vaccine: Marland Kitchen Difficulty breathing  . Swelling of your face and throat  . A fast heartbeat  . A bad rash all over your body  . Dizziness and weakness    Immunizations Administered    Name Date Dose VIS Date Route   Pfizer COVID-19 Vaccine 09/16/2019 11:05 AM 0.3 mL 07/14/2019 Intramuscular   Manufacturer: [place]   Lot: [id number]   [place]: [id number]

## 2019-09-20 ENCOUNTER — Other Ambulatory Visit: Payer: Self-pay | Admitting: Family Medicine

## 2019-09-21 ENCOUNTER — Other Ambulatory Visit: Payer: Self-pay | Admitting: Pulmonary Disease

## 2019-09-25 DIAGNOSIS — J9610 Chronic respiratory failure, unspecified whether with hypoxia or hypercapnia: Secondary | ICD-10-CM | POA: Diagnosis not present

## 2019-09-28 ENCOUNTER — Other Ambulatory Visit (INDEPENDENT_AMBULATORY_CARE_PROVIDER_SITE_OTHER): Payer: Medicare Other

## 2019-09-28 DIAGNOSIS — D649 Anemia, unspecified: Secondary | ICD-10-CM

## 2019-09-28 LAB — FECAL OCCULT BLOOD, IMMUNOCHEMICAL: Fecal Occult Bld: NEGATIVE

## 2019-10-14 ENCOUNTER — Other Ambulatory Visit: Payer: Self-pay | Admitting: Endocrinology

## 2019-10-14 ENCOUNTER — Other Ambulatory Visit: Payer: Self-pay | Admitting: Family Medicine

## 2019-10-14 NOTE — Telephone Encounter (Signed)
Please forward refill request to pt's primary care provider.   

## 2019-10-16 NOTE — Telephone Encounter (Signed)
Per instructions of Dr. Loanne Drilling, this request is being forwarded to pt's PCP.

## 2019-10-19 ENCOUNTER — Other Ambulatory Visit: Payer: Self-pay | Admitting: Family Medicine

## 2019-10-23 DIAGNOSIS — J9610 Chronic respiratory failure, unspecified whether with hypoxia or hypercapnia: Secondary | ICD-10-CM | POA: Diagnosis not present

## 2019-11-09 ENCOUNTER — Other Ambulatory Visit: Payer: Self-pay | Admitting: Family Medicine

## 2019-11-13 ENCOUNTER — Other Ambulatory Visit: Payer: Self-pay | Admitting: Family Medicine

## 2019-11-13 DIAGNOSIS — J449 Chronic obstructive pulmonary disease, unspecified: Secondary | ICD-10-CM

## 2019-11-14 ENCOUNTER — Other Ambulatory Visit: Payer: Self-pay | Admitting: Family Medicine

## 2019-11-22 DIAGNOSIS — E119 Type 2 diabetes mellitus without complications: Secondary | ICD-10-CM | POA: Diagnosis not present

## 2019-11-22 DIAGNOSIS — H52222 Regular astigmatism, left eye: Secondary | ICD-10-CM | POA: Diagnosis not present

## 2019-11-22 LAB — HM DIABETES EYE EXAM

## 2019-11-23 ENCOUNTER — Other Ambulatory Visit: Payer: Self-pay | Admitting: Family Medicine

## 2019-11-23 DIAGNOSIS — J9610 Chronic respiratory failure, unspecified whether with hypoxia or hypercapnia: Secondary | ICD-10-CM | POA: Diagnosis not present

## 2019-11-24 ENCOUNTER — Other Ambulatory Visit: Payer: Self-pay

## 2019-11-27 ENCOUNTER — Other Ambulatory Visit (INDEPENDENT_AMBULATORY_CARE_PROVIDER_SITE_OTHER): Payer: Medicare Other

## 2019-11-27 ENCOUNTER — Other Ambulatory Visit: Payer: Self-pay

## 2019-11-27 DIAGNOSIS — D649 Anemia, unspecified: Secondary | ICD-10-CM | POA: Diagnosis not present

## 2019-11-27 LAB — CBC WITH DIFFERENTIAL/PLATELET
Basophils Absolute: 0 10*3/uL (ref 0.0–0.1)
Basophils Relative: 0.3 % (ref 0.0–3.0)
Eosinophils Absolute: 0.1 10*3/uL (ref 0.0–0.7)
Eosinophils Relative: 1.6 % (ref 0.0–5.0)
HCT: 31.3 % — ABNORMAL LOW (ref 36.0–46.0)
Hemoglobin: 10.1 g/dL — ABNORMAL LOW (ref 12.0–15.0)
Lymphocytes Relative: 16.5 % (ref 12.0–46.0)
Lymphs Abs: 1 10*3/uL (ref 0.7–4.0)
MCHC: 32.4 g/dL (ref 30.0–36.0)
MCV: 95.9 fl (ref 78.0–100.0)
Monocytes Absolute: 0.5 10*3/uL (ref 0.1–1.0)
Monocytes Relative: 8.6 % (ref 3.0–12.0)
Neutro Abs: 4.6 10*3/uL (ref 1.4–7.7)
Neutrophils Relative %: 73 % (ref 43.0–77.0)
Platelets: 204 10*3/uL (ref 150.0–400.0)
RBC: 3.26 Mil/uL — ABNORMAL LOW (ref 3.87–5.11)
RDW: 14.4 % (ref 11.5–15.5)
WBC: 6.3 10*3/uL (ref 4.0–10.5)

## 2019-11-28 ENCOUNTER — Encounter: Payer: Self-pay | Admitting: Family Medicine

## 2019-12-04 ENCOUNTER — Other Ambulatory Visit: Payer: Self-pay | Admitting: Family Medicine

## 2019-12-05 ENCOUNTER — Other Ambulatory Visit: Payer: Self-pay

## 2019-12-06 ENCOUNTER — Encounter: Payer: Self-pay | Admitting: Family Medicine

## 2019-12-06 ENCOUNTER — Ambulatory Visit (INDEPENDENT_AMBULATORY_CARE_PROVIDER_SITE_OTHER): Payer: Medicare Other | Admitting: Family Medicine

## 2019-12-06 VITALS — BP 122/70 | HR 100 | Wt 115.0 lb

## 2019-12-06 DIAGNOSIS — E538 Deficiency of other specified B group vitamins: Secondary | ICD-10-CM

## 2019-12-06 DIAGNOSIS — W57XXXA Bitten or stung by nonvenomous insect and other nonvenomous arthropods, initial encounter: Secondary | ICD-10-CM

## 2019-12-06 DIAGNOSIS — S40861A Insect bite (nonvenomous) of right upper arm, initial encounter: Secondary | ICD-10-CM | POA: Diagnosis not present

## 2019-12-06 DIAGNOSIS — D509 Iron deficiency anemia, unspecified: Secondary | ICD-10-CM

## 2019-12-06 DIAGNOSIS — R000 Tachycardia, unspecified: Secondary | ICD-10-CM | POA: Diagnosis not present

## 2019-12-06 DIAGNOSIS — J0100 Acute maxillary sinusitis, unspecified: Secondary | ICD-10-CM

## 2019-12-06 LAB — VITAMIN B12: Vitamin B-12: 119 pg/mL — ABNORMAL LOW (ref 211–911)

## 2019-12-06 LAB — IBC + FERRITIN
Ferritin: 36.5 ng/mL (ref 10.0–291.0)
Iron: 31 ug/dL — ABNORMAL LOW (ref 42–145)
Saturation Ratios: 7.7 % — ABNORMAL LOW (ref 20.0–50.0)
Transferrin: 288 mg/dL (ref 212.0–360.0)

## 2019-12-06 MED ORDER — DOXYCYCLINE HYCLATE 100 MG PO TABS
100.0000 mg | ORAL_TABLET | Freq: Two times a day (BID) | ORAL | 0 refills | Status: AC
Start: 2019-12-06 — End: 2019-12-13

## 2019-12-06 NOTE — Progress Notes (Signed)
[name] DOB: Aug 27, 1931 Encounter date: 12/06/2019  This is a 86 y.o. female who presents with Chief Complaint  Patient presents with  . Follow-up    discuss labs    History of present illness: Has had some sinus congestion - pain under both eyes and better after use of vicks inhaler and blowing nose. No fevers or chills at home.   *put batter in glucometer wrong and so waiting for daughter to come help her fix this. Appetite not good this week with sinus symptoms, but has been stable otherwise.   *Has been checking blood pressures at home. Getting 081 average systolic and 76 diastolic.  Occasionally up to 130 but usually back down with recheck.   *breathing ok if sitting. Does feel like it is worse than it was before. Worse with any activity.     Allergies  Allergen Reactions  . Crestor [Rosuvastatin Calcium]     cramping   Current Meds  Medication Sig  . acetaminophen (TYLENOL) 500 MG tablet Take 500 mg by mouth every 6 (six) hours as needed for headache (pain).   . Albuterol Sulfate (PROAIR RESPICLICK) 448 (90 Base) MCG/ACT AEPB Inhale 1 puff into the lungs 4 (four) times daily as needed (shortness of breath/ wheezing).  Marland Kitchen aspirin EC 81 MG tablet Take 81 mg by mouth at bedtime.  . budesonide (PULMICORT) 0.25 MG/2ML nebulizer solution TAKE 2 MLS (0.25 MG TOTAL) BY NEBULIZATION 2 (TWO) TIMES DAILY  . Calcium Carb-Cholecalciferol (CALCIUM 1000 + D PO) Take 2 tablets by mouth daily.  Marland Kitchen levothyroxine (SYNTHROID) 50 MCG tablet TAKE 1 TABLET BY MOUTH DAILY BEFORE BREAKFAST  . losartan (COZAAR) 100 MG tablet TAKE 1 TABLET BY MOUTH EVERY DAY  . metFORMIN (GLUCOPHAGE-XR) 500 MG 24 hr tablet TAKE 1 TABLET BY MOUTH EVERY DAY  . metoprolol succinate (TOPROL-XL) 25 MG 24 hr tablet TAKE 1 TABLET BY MOUTH EVERY DAY  . NIFEdipine (PROCARDIA-XL/NIFEDICAL-XL) 30 MG 24 hr tablet TAKE 1 TABLET BY MOUTH EVERY MORNING  . pravastatin (PRAVACHOL) 20 MG tablet TAKE 1 TABLET BY MOUTH EVERY  DAY  . Respiratory Therapy Supplies (NEBULIZER COMPRESSOR) KIT PLEASE DISPENSE 1 NEBULIZER MACHINE AND SUPPLIES DX: J44.9  . STIOLTO RESPIMAT 2.5-2.5 MCG/ACT AERS INHALE 2 PUFFS BY MOUTH INTO THE LUNGS DAILY    Review of Systems  Constitutional: Negative for chills, fatigue and fever.  HENT: Positive for congestion, sinus pain and sore throat (with post nasal drainage).   Respiratory: Positive for shortness of breath. Negative for cough, chest tightness and wheezing.   Cardiovascular: Negative for chest pain, palpitations and leg swelling.  Gastrointestinal: Negative for abdominal pain, blood in stool, constipation and diarrhea.  Genitourinary: Negative for difficulty urinating, hematuria and menstrual problem.  Psychiatric/Behavioral: Negative for suicidal ideas.    Objective:  There were no vitals taken for this visit.      BP Readings from Last 3 Encounters:  02/01/19 128/87  11/23/18 118/68  08/11/18 134/68   Wt Readings from Last 3 Encounters:  02/01/19 105 lb (47.6 kg)  08/11/18 99 lb 6.4 oz (45.1 kg)  08/09/18 100 lb 6.4 oz (45.5 kg)    Physical Exam Constitutional:      General: She is not in acute distress.    Appearance: She is well-developed.     Comments: Appears thin  HENT:     Right Ear: Tympanic membrane, ear canal and external ear normal.     Left Ear: Tympanic membrane, ear canal and external ear normal.  Nose:     Right Turbinates: Enlarged.     Left Turbinates: Enlarged.     Mouth/Throat:     Mouth: Mucous membranes are moist.     Palate: No mass.     Pharynx: Posterior oropharyngeal erythema present. No oropharyngeal exudate.  Cardiovascular:     Rate and Rhythm: Normal rate and regular rhythm.     Heart sounds: Normal heart sounds. No murmur. No friction rub.  Pulmonary:     Effort: Pulmonary effort is normal. No respiratory distress.     Breath sounds: Decreased breath sounds (mild) present. No wheezing or rales.  Musculoskeletal:      Right lower leg: No edema.     Left lower leg: No edema.  Skin:    Comments: Right upper arm has large area of erythema approximately 6 x 6 cm wrapping to the anterior arm.  There is a central area where tick was previously attached, although patient was able to remove this entirely.  Uncertain length of time of attachment.  There are some new erythematous papules, which patient had not noted before which seem to be extending from the already erythematous base into the superior and inferior directions.  Neurological:     Mental Status: She is alert and oriented to person, place, and time.  Psychiatric:        Behavior: Behavior normal.     Assessment/Plan  1. Tachycardia Heart rate did decrease from the time provider checked until EKG.  Suspect heart rate was elevated secondary to exertion with walking into the office and increased oxygen demand.  EKG is stable from prior sinus rhythm with no acute changes. - EKG 12-Lead  2. Iron deficiency anemia, unspecified iron deficiency anemia type We will recheck blood work.  She has not been taking iron supplement, but is agreeable to do so.  She had a negative fecal occult test since the time anemia started.  She does not want to have a follow-up with GI and refuses colonoscopy. - IBC + Ferritin; Future - IBC + Ferritin  3. B12 deficiency We will check for B12 deficiency as this would worsen her anemia.  We discussed that anything we can do to help out with her anemia will help with breathing overall. - Vitamin B12; Future - Vitamin B12  4. Tick bite, initial encounter Due to erythema of upper arm (although patient states it is much better than it was before) and since there are some new areas of erythema, we will place on doxycycline.  5. Acute maxillary sinusitis, recurrence not specified Although symptoms are stable, we are going to treat above cellulitis/tick bite with doxycycline, which should also cover for sinusitis.  Let me know if any  worsening of symptoms.   Return for pending bloodwork.    Micheline Rough, MD

## 2019-12-06 NOTE — Patient Instructions (Addendum)
Could consider ferrous sulfate 325mg  (65 elemental iron) a couple days/week.   Iron Deficiency Anemia, Adult Iron-deficiency anemia is when you have a low amount of red blood cells or hemoglobin. This happens because you have too little iron in your body. Hemoglobin carries oxygen to parts of the body. Anemia can cause your body to not get enough oxygen. It may or may not cause symptoms. Follow these instructions at home: Medicines  Take over-the-counter and prescription medicines only as told by your doctor. This includes iron pills (supplements) and vitamins.  If you cannot handle taking iron pills by mouth, ask your doctor about getting iron through: ? A vein (intravenously). ? A shot (injection) into a muscle.  Take iron pills when your stomach is empty. If you cannot handle this, take them with food.  Do not drink milk or take antacids at the same time as your iron pills.  To prevent trouble pooping (constipation), eat fiber or take medicine (stool softener) as told by your doctor. Eating and drinking   Talk with your doctor before changing the foods you eat. He or she may tell you to eat foods that have a lot of iron, such as: ? Liver. ? Lowfat (lean) beef. ? Breads and cereals that have iron added to them (fortified breads and cereals). ? Eggs. ? Dried fruit. ? Dark green, leafy vegetables.  Drink enough fluid to keep your pee (urine) clear or pale yellow.  Eat fresh fruits and vegetables that are high in vitamin C. They help your body to use iron. Foods with a lot of vitamin C include: ? Oranges. ? Peppers. ? Tomatoes. ? Mangoes. General instructions  Return to your normal activities as told by your doctor. Ask your doctor what activities are safe for you.  Keep yourself clean, and keep things clean around you (your surroundings). Anemia can make you get sick more easily.  Keep all follow-up visits as told by your doctor. This is important. Contact a doctor  if:  You feel sick to your stomach (nauseous).  You throw up (vomit).  You feel weak.  You are sweating for no clear reason.  You have trouble pooping, such as: ? Pooping (having a bowel movement) less than 3 times a week. ? Straining to poop. ? Having poop that is hard, dry, or larger than normal. ? Feeling full or bloated. ? Pain in the lower belly. ? Not feeling better after pooping. Get help right away if:  You pass out (faint). If this happens, do not drive yourself to the hospital. Call your local emergency services (911 in the [place]  You have chest pain.  You have shortness of breath that: ? Is very bad. ? Gets worse with physical activity.  You have a fast heartbeat.  You get light-headed when getting up from sitting or lying down. This information is not intended to replace advice given to you by your health care provider. Make sure you discuss any questions you have with your health care provider. Document Revised: 07/02/2017 Document Reviewed: 04/08/2016 Elsevier Patient Education  [place].

## 2019-12-07 NOTE — Addendum Note (Signed)
Addended by: Agnes Lawrence on: 12/07/2019 01:53 PM   Modules accepted: Orders

## 2019-12-09 ENCOUNTER — Other Ambulatory Visit: Payer: Self-pay | Admitting: Family Medicine

## 2019-12-19 ENCOUNTER — Other Ambulatory Visit: Payer: Self-pay | Admitting: Family Medicine

## 2019-12-23 DIAGNOSIS — J9610 Chronic respiratory failure, unspecified whether with hypoxia or hypercapnia: Secondary | ICD-10-CM | POA: Diagnosis not present

## 2019-12-30 ENCOUNTER — Other Ambulatory Visit: Payer: Self-pay | Admitting: Family Medicine

## 2020-01-09 ENCOUNTER — Other Ambulatory Visit: Payer: Medicare Other

## 2020-01-09 ENCOUNTER — Other Ambulatory Visit (INDEPENDENT_AMBULATORY_CARE_PROVIDER_SITE_OTHER): Payer: Medicare Other

## 2020-01-09 DIAGNOSIS — D509 Iron deficiency anemia, unspecified: Secondary | ICD-10-CM | POA: Diagnosis not present

## 2020-01-09 DIAGNOSIS — E538 Deficiency of other specified B group vitamins: Secondary | ICD-10-CM

## 2020-01-09 LAB — CBC WITH DIFFERENTIAL/PLATELET
Basophils Absolute: 0.1 10*3/uL (ref 0.0–0.1)
Basophils Relative: 0.7 % (ref 0.0–3.0)
Eosinophils Absolute: 0.3 10*3/uL (ref 0.0–0.7)
Eosinophils Relative: 4.5 % (ref 0.0–5.0)
HCT: 31.6 % — ABNORMAL LOW (ref 36.0–46.0)
Hemoglobin: 10.4 g/dL — ABNORMAL LOW (ref 12.0–15.0)
Lymphocytes Relative: 30.1 % (ref 12.0–46.0)
Lymphs Abs: 2.1 10*3/uL (ref 0.7–4.0)
MCHC: 32.8 g/dL (ref 30.0–36.0)
MCV: 95.4 fl (ref 78.0–100.0)
Monocytes Absolute: 0.8 10*3/uL (ref 0.1–1.0)
Monocytes Relative: 10.8 % (ref 3.0–12.0)
Neutro Abs: 3.8 10*3/uL (ref 1.4–7.7)
Neutrophils Relative %: 53.9 % (ref 43.0–77.0)
Platelets: 219 10*3/uL (ref 150.0–400.0)
RBC: 3.32 Mil/uL — ABNORMAL LOW (ref 3.87–5.11)
RDW: 14.6 % (ref 11.5–15.5)
WBC: 7.1 10*3/uL (ref 4.0–10.5)

## 2020-01-09 LAB — VITAMIN B12: Vitamin B-12: 550 pg/mL (ref 211–911)

## 2020-01-12 ENCOUNTER — Other Ambulatory Visit: Payer: Self-pay | Admitting: Family Medicine

## 2020-01-12 DIAGNOSIS — D509 Iron deficiency anemia, unspecified: Secondary | ICD-10-CM

## 2020-01-12 NOTE — Progress Notes (Signed)
Ua ordered

## 2020-01-14 ENCOUNTER — Other Ambulatory Visit: Payer: Self-pay | Admitting: Family Medicine

## 2020-01-23 DIAGNOSIS — J9610 Chronic respiratory failure, unspecified whether with hypoxia or hypercapnia: Secondary | ICD-10-CM | POA: Diagnosis not present

## 2020-02-08 ENCOUNTER — Other Ambulatory Visit: Payer: Self-pay | Admitting: Family Medicine

## 2020-02-22 DIAGNOSIS — J9610 Chronic respiratory failure, unspecified whether with hypoxia or hypercapnia: Secondary | ICD-10-CM | POA: Diagnosis not present

## 2020-03-01 ENCOUNTER — Other Ambulatory Visit: Payer: Self-pay | Admitting: Family Medicine

## 2020-03-08 ENCOUNTER — Other Ambulatory Visit: Payer: Self-pay | Admitting: Pulmonary Disease

## 2020-03-12 NOTE — Addendum Note (Signed)
Addended by: Marrion Coy on: 03/12/2020 12:00 PM   Modules accepted: Orders

## 2020-03-15 ENCOUNTER — Ambulatory Visit (INDEPENDENT_AMBULATORY_CARE_PROVIDER_SITE_OTHER): Payer: Medicare Other

## 2020-03-15 ENCOUNTER — Other Ambulatory Visit: Payer: Self-pay

## 2020-03-15 VITALS — BP 129/73 | HR 75 | Temp 98.4°F | Wt 106.0 lb

## 2020-03-15 DIAGNOSIS — Z0000 Encounter for general adult medical examination without abnormal findings: Secondary | ICD-10-CM | POA: Diagnosis not present

## 2020-03-15 NOTE — Progress Notes (Signed)
Subjective:   Charlotte Harris is a 86 y.o. female who presents for Medicare Annual (Subsequent) preventive examination.  I connected with [name] today by telephone and verified that I am speaking with the correct person using two identifiers. Location patient: home Location provider: work Persons participating in the virtual visit: patient, provider.   I discussed the limitations, risks, security and privacy concerns of performing an evaluation and management service by telephone and the availability of in person appointments. I also discussed with the patient that there may be a patient responsible charge related to this service. The patient expressed understanding and verbally consented to this telephonic visit.    Interactive audio and video telecommunications were attempted between this provider and patient, however failed, due to patient having technical difficulties OR patient did not have access to video capability.  We continued and completed visit with audio only.      Review of Systems    N/A       Objective:    Today's Vitals   03/15/20 1113  BP: 129/73  Pulse: 75  Temp: 98.4 F (36.9 C)  TempSrc: Oral  SpO2: 98%  Weight: 106 lb (48.1 kg)   Body mass index is 17.64 kg/m.  Advanced Directives 03/15/2020 08/09/2018 07/19/2018 07/18/2018 01/31/2018 01/16/2016 11/15/2014  Does Patient Have a Medical Advance Directive? Yes Yes Yes Yes No No No  Type of Paramedic of [place];Living will [place];Living will Living will Living will - - -  Does patient want to make changes to medical advance directive? No - Patient declined No - Patient declined No - Patient declined - - - -  Copy of [place] in Chart? Yes - validated most recent copy scanned in chart (See row information) - - - - - -  Would patient like information on creating a medical advance directive? - No - Patient declined - No - Patient  declined - - -  Pre-existing out of facility DNR order (yellow form or pink MOST form) - - - - - - -    Current Medications (verified) Outpatient Encounter Medications as of 03/15/2020  Medication Sig  . acetaminophen (TYLENOL) 500 MG tablet Take 500 mg by mouth every 6 (six) hours as needed for headache (pain).   . Albuterol Sulfate (PROAIR RESPICLICK) 259 (90 Base) MCG/ACT AEPB Inhale 1 puff into the lungs 4 (four) times daily as needed (shortness of breath/ wheezing).  Marland Kitchen aspirin EC 81 MG tablet Take 81 mg by mouth at bedtime.  . budesonide (PULMICORT) 0.25 MG/2ML nebulizer solution TAKE 2 MLS (0.25 MG TOTAL) BY NEBULIZATION 2 (TWO) TIMES DAILY  . Calcium Carb-Cholecalciferol (CALCIUM 1000 + D PO) Take 2 tablets by mouth daily.  . Ferrous Sulfate (IRON) 325 (65 Fe) MG TABS Take by mouth.  . levothyroxine (SYNTHROID) 50 MCG tablet TAKE 1 TABLET BY MOUTH DAILY BEFORE BREAKFAST  . losartan (COZAAR) 100 MG tablet TAKE 1 TABLET BY MOUTH EVERY DAY  . metFORMIN (GLUCOPHAGE-XR) 500 MG 24 hr tablet TAKE 1 TABLET BY MOUTH EVERY DAY  . metoprolol succinate (TOPROL-XL) 25 MG 24 hr tablet TAKE 1 TABLET BY MOUTH EVERY DAY  . NIFEdipine (PROCARDIA-XL/NIFEDICAL-XL) 30 MG 24 hr tablet TAKE 1 TABLET BY MOUTH EVERY MORNING  . pravastatin (PRAVACHOL) 20 MG tablet TAKE 1 TABLET BY MOUTH EVERY DAY  . Respiratory Therapy Supplies (NEBULIZER COMPRESSOR) KIT PLEASE DISPENSE 1 NEBULIZER MACHINE AND SUPPLIES DX: J44.9  . STIOLTO RESPIMAT 2.5-2.5 MCG/ACT  AERS INHALE 2 PUFFS BY MOUTH INTO THE LUNGS DAILY  . vitamin B-12 (CYANOCOBALAMIN) 100 MCG tablet Take 100 mcg by mouth daily.  Marland Kitchen ipratropium-albuterol (DUONEB) 0.5-2.5 (3) MG/3ML SOLN Take 3 mLs by nebulization every 4 (four) hours as needed.   No facility-administered encounter medications on file as of 03/15/2020.    Allergies (verified) Crestor [rosuvastatin calcium]   History: Past Medical History:  Diagnosis Date  . ADENOCARCINOMA, LUNG 07/03/2010   RUL    . ASYMPTOMATIC POSTMENOPAUSAL STATUS 07/02/2009  . Breast cancer ([place]) 1991   R mastectomy, no chemo or radiation  . COLONIC POLYPS, HX OF 04/16/2007  . COPD 05/17/2008  . HEMATURIA, HX OF 04/16/2007  . History of radiation therapy 07/30/09 to 08/09/09   RUL lung  . HYPERLIPIDEMIA 04/16/2007  . Hypertension   . HYPERTENSION 04/16/2007  . HYPOTHYROIDISM, POST-RADIATION 06/07/2008  . Osteoarthritis   . OSTEOPOROSIS 07/02/2009  . Wrist fracture, left    Past Surgical History:  Procedure Laterality Date  . BREAST SURGERY     lumpectomy  . EYE SURGERY Bilateral    cataracts  . 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 LUNG BIOPSY  05/22/2009   RUL lung=Adenocarcinoma  . MASTECTOMY  1991   right  . TONSILLECTOMY  1955   Family History  Problem Relation Age of Onset  . Heart attack Mother 105  . Heart disease Mother   . Cancer Sister        "throat" cancer  . Heart disease Father   . Cancer Brother        blood  . Heart disease Sister   . Cancer Brother        blood  . Heart disease Other        CAD  . Hypertension Other   . Colon cancer Neg Hx   . Stomach cancer Neg Hx    Social History   Socioeconomic History  . Marital status: Widowed    Spouse name: Not on file  . Number of children: Not on file  . Years of education: Not on file  . Highest education level: Not on file  Occupational History  . Occupation: Retired (worked Lexicographer)    Employer: RETIRED  Tobacco Use  . Smoking status: Former Smoker    Packs/day: 1.00    Years: 50.00    Pack years: 50.00    Types: Cigarettes    Quit date: 08/03/2009    Years since quitting: 10.6  . Smokeless tobacco: Never Used  Vaping Use  . Vaping Use: Never used  Substance and Sexual Activity  . Alcohol use: Yes    Alcohol/week: 2.0 standard drinks    Types: 2 Glasses of wine per week  . Drug use: No  . Sexual activity: Not on file  Other Topics Concern  . Not on file  Social History  Narrative   Widowed 2005   Quit smoking 2010   No other exposures, no TB hx   Social Determinants of Health   Financial Resource Strain: Low Risk   . Difficulty of Paying Living Expenses: Not hard at all  Food Insecurity: No Food Insecurity  . Worried About Charity fundraiser in the Last Year: Never true  . Ran Out of Food in the Last Year: Never true  Transportation Needs: No Transportation Needs  . Lack of Transportation (Medical): No  . Lack of Transportation (Non-Medical): No  Physical Activity: Insufficiently Active  .  Days of Exercise per Week: 3 days  . Minutes of Exercise per Session: 30 min  Stress: No Stress Concern Present  . Feeling of Stress : Not at all  Social Connections: Moderately Isolated  . Frequency of Communication with Friends and Family: More than three times a week  . Frequency of Social Gatherings with Friends and Family: Once a week  . Attends Religious Services: More than 4 times per year  . Active Member of Clubs or Organizations: No  . Attends Archivist Meetings: Never  . Marital Status: Widowed    Tobacco Counseling Counseling given: Not Answered   Clinical Intake:  Pre-visit preparation completed: Yes  Pain : No/denies pain     Nutritional Risks: None Diabetes: Yes CBG done?: No Did pt. bring in CBG monitor from home?: No (Patient states has checked blood sugar in a while states that battery is dead in her meter)  How often do you need to have someone help you when you read instructions, pamphlets, or other written materials from your doctor or pharmacy?: 1 - Never What is the last grade level you completed in school?: High school graduate  Diabetic?yes  Interpreter Needed?: No  Information entered by :: [place] of Daily Living In your present state of health, do you have any difficulty performing the following activities: 03/15/2020  Hearing? N  Vision? Y  Comment has had some vision changes due to  macular degeneration  Difficulty concentrating or making decisions? N  Walking or climbing stairs? Y  Comment Has issues with elevated heart rate due to respiratory issues  Dressing or bathing? N  Doing errands, shopping? N  Preparing Food and eating ? N  Using the Toilet? N  In the past six months, have you accidently leaked urine? N  Do you have problems with loss of bowel control? N  Managing your Medications? N  Managing your Finances? N  Housekeeping or managing your Housekeeping? N  Some recent data might be hidden    Patient Care Team: Caren Macadam, MD as PCP - General (Family Medicine) Curt Bears, MD as Consulting Physician (Oncology) Kyung Rudd, MD as Consulting Physician (Radiation Oncology) Ladene Artist, MD as Consulting Physician (Gastroenterology) Webb Laws, [place] as Referring Physician (Optometry)  Indicate any recent Medical Services you may have received from other than [facility] providers in the past year (date may be approximate).     Assessment:   This is a routine wellness examination for [place].  Hearing/Vision screen  Hearing Screening   '125Hz'$  '250Hz'$  '500Hz'$  '1000Hz'$  '2000Hz'$  '3000Hz'$  '4000Hz'$  '6000Hz'$  '8000Hz'$   Right ear:           Left ear:           Vision Screening Comments: Gets annual eye exams   Dietary issues and exercise activities discussed: Current Exercise Habits: Home exercise routine, Type of exercise: stretching, Time (Minutes): 25, Frequency (Times/Week): 3, Weekly Exercise (Minutes/Week): 75, Intensity: Mild, Exercise limited by: respiratory conditions(s);cardiac condition(s)  Goals    . Patient Stated     I will continue to drink water       Depression Screen PHQ 2/9 Scores 03/15/2020 12/07/2019 05/09/2015  PHQ - 2 Score 0 0 0  PHQ- 9 Score 0 - -    Fall Risk Fall Risk  03/15/2020 08/30/2014  Falls in the past year? 0 No  Number falls in past yr: 0 -  Injury with Fall? 0 -  Risk for fall due to :  Medication side effect -    Follow up Falls evaluation completed;Falls prevention discussed -    Any stairs in or around the home? No  If so, are there any without handrails? No  Home free of loose throw rugs in walkways, pet beds, electrical cords, etc? Yes  Adequate lighting in your home to reduce risk of falls? Yes   ASSISTIVE DEVICES UTILIZED TO PREVENT FALLS:  Life alert? No  Use of a cane, walker or w/c? No  Grab bars in the bathroom? Yes  Shower chair or bench in shower? Yes  Elevated toilet seat or a handicapped toilet? No     Cognitive Function:     6CIT Screen 03/15/2020  What Year? 0 points  What month? 0 points  What time? 0 points  Count back from 20 0 points  Months in reverse 0 points  Repeat phrase 0 points  Total Score 0    Immunizations Immunization History  Administered Date(s) Administered  . Fluad Quad(high Dose 65+) 05/26/2019  . Influenza Whole 06/03/2009, 05/03/2010  . Influenza-Unspecified 04/04/2011, 05/03/2013, 05/03/2014, 04/19/2017, 04/17/2018  . PFIZER SARS-COV-2 Vaccination 08/26/2019, 09/16/2019  . Pneumococcal Conjugate-13 08/16/2014  . Pneumococcal Polysaccharide-23 06/04/1997, 07/03/2010  . Td 01/01/1998  . Tdap 11/15/2014  . Zoster 02/13/2014    TDAP status: Up to date Flu Vaccine status: Up to date Pneumococcal vaccine status: Up to date Covid-19 vaccine status: Completed vaccines  Qualifies for Shingles Vaccine? Yes   Zostavax completed Yes   Shingrix Completed?: No.    Education has been provided regarding the importance of this vaccine. Patient has been advised to call insurance company to determine out of pocket expense if they have not yet received this vaccine. Advised may also receive vaccine at local pharmacy or Health Dept. Verbalized acceptance and understanding.  Screening Tests Health Maintenance  Topic Date Due  . FOOT EXAM  07/21/2018  . COLONOSCOPY  06/04/2019  . INFLUENZA VACCINE  03/03/2020  . HEMOGLOBIN A1C  03/04/2020  .  OPHTHALMOLOGY EXAM  11/21/2020  . TETANUS/TDAP  11/14/2024  . DEXA SCAN  Completed  . COVID-19 Vaccine  Completed  . PNA vac Low Risk Adult  Completed    Health Maintenance  Health Maintenance Due  Topic Date Due  . FOOT EXAM  07/21/2018  . COLONOSCOPY  06/04/2019  . INFLUENZA VACCINE  03/03/2020  . HEMOGLOBIN A1C  03/04/2020    Colorectal cancer screening: No longer required.  Mammogram status: No longer required.  Bone Density status: Completed 02/18/2018. Results reflect: Bone density results: OSTEOPOROSIS. Repeat every 2 years.  Lung Cancer Screening: (Low Dose CT Chest recommended if Age 63-80 years, 30 pack-year currently smoking OR have quit w/in 15years.) does qualify.   Lung Cancer Screening Referral: N/A  Additional Screening:  Hepatitis C Screening: does not qualify;   Vision Screening: Recommended annual ophthalmology exams for early detection of glaucoma and other disorders of the eye. Is the patient up to date with their annual eye exam?  Yes  Who is the provider or what is the name of the office in which the patient attends annual eye exams? Dr. Einar Gip  If pt is not established with a provider, would they like to be referred to a provider to establish care? No .   Dental Screening: Recommended annual dental exams for proper oral hygiene  Community Resource Referral / Chronic Care Management: CRR required this visit?  No   CCM required this visit?  No      Plan:  I have personally reviewed and noted the following in the patient's chart:   . Medical and social history . Use of alcohol, tobacco or illicit drugs  . Current medications and supplements . Functional ability and status . Nutritional status . Physical activity . Advanced directives . List of other physicians . Hospitalizations, surgeries, and ER visits in previous 12 months . Vitals . Screenings to include cognitive, depression, and falls . Referrals and appointments  In  addition, I have reviewed and discussed with patient certain preventive protocols, quality metrics, and best practice recommendations. A written personalized care plan for preventive services as well as general preventive health recommendations were provided to patient.     Ofilia Neas, LPN   08/08/8164   Nurse Notes: None

## 2020-03-15 NOTE — Patient Instructions (Signed)
Charlotte Harris , Thank you for taking time to come for your Medicare Wellness Visit. I appreciate your ongoing commitment to your health goals. Please review the following plan we discussed and let me know if I can assist you in the future.   Screening recommendations/referrals: Colonoscopy: No longer required Mammogram: No longer required  Bone Density: Up to date, next due 02/18/2021 Recommended yearly ophthalmology/optometry visit for glaucoma screening and checkup Recommended yearly dental visit for hygiene and checkup  Vaccinations: Influenza vaccine: Up to date, next due fall 2021 Pneumococcal vaccine: completed series Tdap vaccine: Up to date, next due 11/27/2014 Shingles vaccine: Currently due, if interested please check with your pharmacy to receive and to discuss cost    Advanced directives: Copies on file   Conditions/risks identified: none   Next appointment: 04/12/2020 @ 10:40 am for [facility] at [facility] 65 Years and Older, Female Preventive care refers to lifestyle choices and visits with your health care provider that can promote health and wellness. What does preventive care include?  A yearly physical exam. This is also called an annual well check.  Dental exams once or twice a year.  Routine eye exams. Ask your health care provider how often you should have your eyes checked.  Personal lifestyle choices, including:  Daily care of your teeth and gums.  Regular physical activity.  Eating a healthy diet.  Avoiding tobacco and drug use.  Limiting alcohol use.  Practicing safe sex.  Taking low-dose aspirin every day.  Taking vitamin and mineral supplements as recommended by your health care provider. What happens during an annual well check? The services and screenings done by your health care provider during your annual well check will depend on your age, overall health, lifestyle risk factors, and family history of  disease. Counseling  Your health care provider may ask you questions about your:  Alcohol use.  Tobacco use.  Drug use.  Emotional well-being.  Home and relationship well-being.  Sexual activity.  Eating habits.  History of falls.  Memory and ability to understand (cognition).  Work and work Statistician.  Reproductive health. Screening  You may have the following tests or measurements:  Height, weight, and BMI.  Blood pressure.  Lipid and cholesterol levels. These may be checked every 5 years, or more frequently if you are over 48 years old.  Skin check.  Lung cancer screening. You may have this screening every year starting at age 84 if you have a 30-pack-year history of smoking and currently smoke or have quit within the past 15 years.  Fecal occult blood test (FOBT) of the stool. You may have this test every year starting at age 40.  Flexible sigmoidoscopy or colonoscopy. You may have a sigmoidoscopy every 5 years or a colonoscopy every 10 years starting at age 84.  Hepatitis C blood test.  Hepatitis B blood test.  Sexually transmitted disease (STD) testing.  Diabetes screening. This is done by checking your blood sugar (glucose) after you have not eaten for a while (fasting). You may have this done every 1-3 years.  Bone density scan. This is done to screen for osteoporosis. You may have this done starting at age 12.  Mammogram. This may be done every 1-2 years. Talk to your health care provider about how often you should have regular mammograms. Talk with your health care provider about your test results, treatment options, and if necessary, the need for more tests. Vaccines  Your health care provider  may recommend certain vaccines, such as:  Influenza vaccine. This is recommended every year.  Tetanus, diphtheria, and acellular pertussis (Tdap, Td) vaccine. You may need a Td booster every 10 years.  Zoster vaccine. You may need this after age  26.  Pneumococcal 13-valent conjugate (PCV13) vaccine. One dose is recommended after age 84.  Pneumococcal polysaccharide (PPSV23) vaccine. One dose is recommended after age 26. Talk to your health care provider about which screenings and vaccines you need and how often you need them. This information is not intended to replace advice given to you by your health care provider. Make sure you discuss any questions you have with your health care provider. Document Released: 08/16/2015 Document Revised: 04/08/2016 Document Reviewed: 05/21/2015 Elsevier Interactive Patient Education  2017 [place] Prevention in the Home Falls can cause injuries. They can happen to people of all ages. There are many things you can do to make your home safe and to help prevent falls. What can I do on the outside of my home?  Regularly fix the edges of walkways and driveways and fix any cracks.  Remove anything that might make you trip as you walk through a door, such as a raised step or threshold.  Trim any bushes or trees on the path to your home.  Use bright outdoor lighting.  Clear any walking paths of anything that might make someone trip, such as rocks or tools.  Regularly check to see if handrails are loose or broken. Make sure that both sides of any steps have handrails.  Any raised decks and porches should have guardrails on the edges.  Have any leaves, snow, or ice cleared regularly.  Use sand or salt on walking paths during winter.  Clean up any spills in your garage right away. This includes oil or grease spills. What can I do in the bathroom?  Use night lights.  Install grab bars by the toilet and in the tub and shower. Do not use towel bars as grab bars.  Use non-skid mats or decals in the tub or shower.  If you need to sit down in the shower, use a plastic, non-slip stool.  Keep the floor dry. Clean up any water that spills on the floor as soon as it happens.  Remove  soap buildup in the tub or shower regularly.  Attach bath mats securely with double-sided non-slip rug tape.  Do not have throw rugs and other things on the floor that can make you trip. What can I do in the bedroom?  Use night lights.  Make sure that you have a light by your bed that is easy to reach.  Do not use any sheets or blankets that are too big for your bed. They should not hang down onto the floor.  Have a firm chair that has side arms. You can use this for support while you get dressed.  Do not have throw rugs and other things on the floor that can make you trip. What can I do in the kitchen?  Clean up any spills right away.  Avoid walking on wet floors.  Keep items that you use a lot in easy-to-reach places.  If you need to reach something above you, use a strong step stool that has a grab bar.  Keep electrical cords out of the way.  Do not use floor polish or wax that makes floors slippery. If you must use wax, use non-skid floor wax.  Do not have throw rugs  and other things on the floor that can make you trip. What can I do with my stairs?  Do not leave any items on the stairs.  Make sure that there are handrails on both sides of the stairs and use them. Fix handrails that are broken or loose. Make sure that handrails are as long as the stairways.  Check any carpeting to make sure that it is firmly attached to the stairs. Fix any carpet that is loose or worn.  Avoid having throw rugs at the top or bottom of the stairs. If you do have throw rugs, attach them to the floor with carpet tape.  Make sure that you have a light switch at the top of the stairs and the bottom of the stairs. If you do not have them, ask someone to add them for you. What else can I do to help prevent falls?  Wear shoes that:  Do not have high heels.  Have rubber bottoms.  Are comfortable and fit you well.  Are closed at the toe. Do not wear sandals.  If you use a  stepladder:  Make sure that it is fully opened. Do not climb a closed stepladder.  Make sure that both sides of the stepladder are locked into place.  Ask someone to hold it for you, if possible.  Clearly mark and make sure that you can see:  Any grab bars or handrails.  First and last steps.  Where the edge of each step is.  Use tools that help you move around (mobility aids) if they are needed. These include:  Canes.  Walkers.  Scooters.  Crutches.  Turn on the lights when you go into a dark area. Replace any light bulbs as soon as they burn out.  Set up your furniture so you have a clear path. Avoid moving your furniture around.  If any of your floors are uneven, fix them.  If there are any pets around you, be aware of where they are.  Review your medicines with your doctor. Some medicines can make you feel dizzy. This can increase your chance of falling. Ask your doctor what other things that you can do to help prevent falls. This information is not intended to replace advice given to you by your health care provider. Make sure you discuss any questions you have with your health care provider. Document Released: 05/16/2009 Document Revised: 12/26/2015 Document Reviewed: 08/24/2014 Elsevier Interactive Patient Education  2017 [employer].

## 2020-03-24 DIAGNOSIS — J9610 Chronic respiratory failure, unspecified whether with hypoxia or hypercapnia: Secondary | ICD-10-CM | POA: Diagnosis not present

## 2020-04-12 ENCOUNTER — Other Ambulatory Visit: Payer: Self-pay

## 2020-04-12 ENCOUNTER — Other Ambulatory Visit: Payer: Self-pay | Admitting: *Deleted

## 2020-04-12 ENCOUNTER — Other Ambulatory Visit: Payer: Medicare Other

## 2020-04-12 DIAGNOSIS — D509 Iron deficiency anemia, unspecified: Secondary | ICD-10-CM

## 2020-04-12 LAB — CBC WITH DIFFERENTIAL/PLATELET
Absolute Monocytes: 592 cells/uL (ref 200–950)
Basophils Absolute: 29 cells/uL (ref 0–200)
Basophils Relative: 0.5 %
Eosinophils Absolute: 180 cells/uL (ref 15–500)
Eosinophils Relative: 3.1 %
HCT: 30.2 % — ABNORMAL LOW (ref 35.0–45.0)
Hemoglobin: 9.7 g/dL — ABNORMAL LOW (ref 11.7–15.5)
Lymphs Abs: 1392 cells/uL (ref 850–3900)
MCH: 30.7 pg (ref 27.0–33.0)
MCHC: 32.1 g/dL (ref 32.0–36.0)
MCV: 95.6 fL (ref 80.0–100.0)
MPV: 11.1 fL (ref 7.5–12.5)
Monocytes Relative: 10.2 %
Neutro Abs: 3608 cells/uL (ref 1500–7800)
Neutrophils Relative %: 62.2 %
Platelets: 195 10*3/uL (ref 140–400)
RBC: 3.16 10*6/uL — ABNORMAL LOW (ref 3.80–5.10)
RDW: 12.5 % (ref 11.0–15.0)
Total Lymphocyte: 24 %
WBC: 5.8 10*3/uL (ref 3.8–10.8)

## 2020-04-12 NOTE — Progress Notes (Signed)
cbc

## 2020-04-15 NOTE — Progress Notes (Signed)
Noted thanks °

## 2020-04-24 DIAGNOSIS — J9610 Chronic respiratory failure, unspecified whether with hypoxia or hypercapnia: Secondary | ICD-10-CM | POA: Diagnosis not present

## 2020-05-10 ENCOUNTER — Other Ambulatory Visit: Payer: Self-pay | Admitting: Family Medicine

## 2020-05-24 ENCOUNTER — Other Ambulatory Visit: Payer: Self-pay | Admitting: Family Medicine

## 2020-05-24 DIAGNOSIS — J9610 Chronic respiratory failure, unspecified whether with hypoxia or hypercapnia: Secondary | ICD-10-CM | POA: Diagnosis not present

## 2020-05-26 ENCOUNTER — Other Ambulatory Visit: Payer: Self-pay | Admitting: Family Medicine

## 2020-05-28 ENCOUNTER — Other Ambulatory Visit: Payer: Self-pay | Admitting: Family Medicine

## 2020-06-24 DIAGNOSIS — J9610 Chronic respiratory failure, unspecified whether with hypoxia or hypercapnia: Secondary | ICD-10-CM | POA: Diagnosis not present

## 2020-07-10 ENCOUNTER — Other Ambulatory Visit: Payer: Self-pay | Admitting: Family Medicine

## 2020-07-23 ENCOUNTER — Encounter: Payer: Self-pay | Admitting: Family Medicine

## 2020-07-24 DIAGNOSIS — J9610 Chronic respiratory failure, unspecified whether with hypoxia or hypercapnia: Secondary | ICD-10-CM | POA: Diagnosis not present

## 2020-07-24 MED ORDER — BUDESONIDE 0.25 MG/2ML IN SUSP
RESPIRATORY_TRACT | 0 refills | Status: DC
Start: 2020-07-24 — End: 2020-10-17

## 2020-08-24 DIAGNOSIS — J9610 Chronic respiratory failure, unspecified whether with hypoxia or hypercapnia: Secondary | ICD-10-CM | POA: Diagnosis not present

## 2020-09-04 ENCOUNTER — Other Ambulatory Visit: Payer: Self-pay | Admitting: Pulmonary Disease

## 2020-09-04 MED ORDER — STIOLTO RESPIMAT 2.5-2.5 MCG/ACT IN AERS
INHALATION_SPRAY | RESPIRATORY_TRACT | 4 refills | Status: DC
Start: 2020-09-04 — End: 2021-02-07

## 2020-09-24 DIAGNOSIS — J9610 Chronic respiratory failure, unspecified whether with hypoxia or hypercapnia: Secondary | ICD-10-CM | POA: Diagnosis not present

## 2020-10-17 ENCOUNTER — Telehealth: Payer: Self-pay | Admitting: Pulmonary Disease

## 2020-10-17 ENCOUNTER — Other Ambulatory Visit: Payer: Self-pay | Admitting: Family Medicine

## 2020-10-17 NOTE — Telephone Encounter (Signed)
Called and spoke to pt. Pt states she wants a POC but she needs to an order and to qualify. Pt last seen in 08/2019 and advised to follow up in a year. Appt made to see Dr. Valeta Harms on 12/04/2020 and to qualify for POC. Pt verbalized understanding and denied any further questions or concerns at this time.

## 2020-10-19 ENCOUNTER — Other Ambulatory Visit: Payer: Self-pay | Admitting: Family Medicine

## 2020-10-22 DIAGNOSIS — J9610 Chronic respiratory failure, unspecified whether with hypoxia or hypercapnia: Secondary | ICD-10-CM | POA: Diagnosis not present

## 2020-10-25 ENCOUNTER — Other Ambulatory Visit: Payer: Self-pay | Admitting: Family Medicine

## 2020-10-30 ENCOUNTER — Telehealth: Payer: Self-pay | Admitting: Family Medicine

## 2020-10-31 ENCOUNTER — Telehealth: Payer: Self-pay | Admitting: Family Medicine

## 2020-10-31 MED ORDER — LEVOTHYROXINE SODIUM 50 MCG PO TABS: 50.0000 ug | ORAL_TABLET | Freq: Every day | ORAL | 0 refills | Status: DC

## 2020-10-31 NOTE — Addendum Note (Signed)
Addended by: Agnes Lawrence on: 10/31/2020 09:18 AM   Modules accepted: Orders

## 2020-10-31 NOTE — Telephone Encounter (Signed)
  levothyroxine (SYNTHROID) 50 MCG tablet  [id number] [address], [place] - [place] Phone:  [phone]  Fax:  [phone]

## 2020-10-31 NOTE — Telephone Encounter (Signed)
levothyroxine (SYNTHROID) 50 MCG tablet    [id number] [address], [place] Phone:  [phone]  Fax:  [phone]

## 2020-10-31 NOTE — Telephone Encounter (Signed)
Rx done. 

## 2020-10-31 NOTE — Telephone Encounter (Signed)
See prior note-duplicate.

## 2020-11-22 DIAGNOSIS — J9610 Chronic respiratory failure, unspecified whether with hypoxia or hypercapnia: Secondary | ICD-10-CM | POA: Diagnosis not present

## 2020-12-04 ENCOUNTER — Encounter: Payer: Self-pay | Admitting: Pulmonary Disease

## 2020-12-04 ENCOUNTER — Other Ambulatory Visit: Payer: Self-pay

## 2020-12-04 ENCOUNTER — Ambulatory Visit (INDEPENDENT_AMBULATORY_CARE_PROVIDER_SITE_OTHER): Payer: Medicare Other | Admitting: Pulmonary Disease

## 2020-12-04 VITALS — BP 130/62 | HR 106 | Temp 98.2°F | Ht 65.0 in | Wt 102.4 lb

## 2020-12-04 DIAGNOSIS — J439 Emphysema, unspecified: Secondary | ICD-10-CM

## 2020-12-04 DIAGNOSIS — J9611 Chronic respiratory failure with hypoxia: Secondary | ICD-10-CM | POA: Diagnosis not present

## 2020-12-04 DIAGNOSIS — Z66 Do not resuscitate: Secondary | ICD-10-CM

## 2020-12-04 DIAGNOSIS — Z923 Personal history of irradiation: Secondary | ICD-10-CM

## 2020-12-04 DIAGNOSIS — J449 Chronic obstructive pulmonary disease, unspecified: Secondary | ICD-10-CM | POA: Diagnosis not present

## 2020-12-04 DIAGNOSIS — C3411 Malignant neoplasm of upper lobe, right bronchus or lung: Secondary | ICD-10-CM

## 2020-12-04 NOTE — Addendum Note (Signed)
Addended by: Lorretta Harp on: 12/04/2020 03:21 PM   Modules accepted: Orders

## 2020-12-04 NOTE — Patient Instructions (Signed)
Thank you for visiting Dr. Valeta Harms at [facility]. Today we recommend the following:  Continue Stiolto Continue albuterol for shortness of breath and wheezing Encourage concentration on daily meal and protein intake. Orders placed for POC.  Return in about 6 months (around 06/06/2021) for w/ Dr. Valeta Harms .    Please do your part to reduce the spread of COVID-19.

## 2020-12-04 NOTE — Progress Notes (Signed)
Synopsis: Referred in Jan 2020 for very severe COPD by Wynn Banker, MD  Subjective:   PATIENT ID: Charlotte Harris GENDER: female DOB: [birth year], MRN: [id number]  Chief Complaint  Patient presents with  . Follow-up    Pt states her breathing has gotten worse since last visit.    PMH of very severe COPD, postbronchodilator FEV1 20% predicted, RUL T1N0M0 lung cancer, s/p radiation by Dr. Mitzi Hansen. Recently admitted to [facility] for COPD exacerbation.  She was subsequently seen in hospital follow-up by Elisha Headland.  She is currently managed with Stiolto and twice daily nebulized Pulmicort.  Baseline functional status she is able to complete most of her activities of daily living on 2 L nasal cannula O2 and occasionally increases to 3 L.  She does have some shortness of breath at baseline.  It does take her some time to get ready in the morning.  She does have daily cough.  Overall since her hospitalization she has noticed a significant improvement.  When discussing her goals of care today in the office.  She informs me that she would never want to be placed on mechanical life support if possible.  I asked if anyone has ever discussed that with her.  She said she recently had discussion with her daughter about this.  She would like to continue to do all treatments to help her breathing and continued normal functional status but would not want aggressive measures.  She knows that she has severe lung disease.  OV 02/01/2019: During her last office visit we discussed CODE STATUS and overall goals of care.  At the time she stated she would not want cardiopulmonary resuscitation.  DNR status was added to her problem list.  Today she is here for follow-up regarding her COPD.  Currently maintained on Stiolto and Pulmicort.  Patient had pet imaging in January 2020.  Revealed low level uptake within the right upper lobe consistent with inflammatory changes. Today she is excited to share that her breathing is  doing great! She is using her inhalers regularly and her HOT 3L pulse and 2L at rist. Denies fevers, chills, cough. Ordering groceries online and having them delivered.   OV 12/04/2020: Here today follow up.  From respiratory standpoint she is doing well.  Unfortunately she has been losing weight.  Her BMI is down to 17.  Here today for evaluation to walk with a POC.  She is stable on 3 L pulsed at rest and 4 L pulsed with exertion.  She should continue this.  Was walked today.   Past Medical History:  Diagnosis Date  . ADENOCARCINOMA, LUNG 07/03/2010   RUL  . ASYMPTOMATIC POSTMENOPAUSAL STATUS 07/02/2009  . Breast cancer (HCC) 1991   R mastectomy, no chemo or radiation  . COLONIC POLYPS, HX OF 04/16/2007  . COPD 05/17/2008  . HEMATURIA, HX OF 04/16/2007  . History of radiation therapy 07/30/09 to 08/09/09   RUL lung  . HYPERLIPIDEMIA 04/16/2007  . Hypertension   . HYPERTENSION 04/16/2007  . HYPOTHYROIDISM, POST-RADIATION 06/07/2008  . Osteoarthritis   . OSTEOPOROSIS 07/02/2009  . Wrist fracture, left      Family History  Problem Relation Age of Onset  . Heart attack Mother 31  . Heart disease Mother   . Cancer Sister        "throat" cancer  . Heart disease Father   . Cancer Brother        blood  . Heart disease Sister   . Cancer  Brother        blood  . Heart disease Other        CAD  . Hypertension Other   . Colon cancer Neg Hx   . Stomach cancer Neg Hx      Past Surgical History:  Procedure Laterality Date  . BREAST SURGERY     lumpectomy  . EYE SURGERY Bilateral    cataracts  . 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 LUNG BIOPSY  05/22/2009   RUL lung=Adenocarcinoma  . MASTECTOMY  1991   right  . TONSILLECTOMY  1955    Social History   Socioeconomic History  . Marital status: Widowed    Spouse name: Not on file  . Number of children: Not on file  . Years of education: Not on file  . Highest education level: Not on file   Occupational History  . Occupation: Retired (worked Lexicographer)    Employer: RETIRED  Tobacco Use  . Smoking status: Former Smoker    Packs/day: 1.00    Years: 50.00    Pack years: 50.00    Types: Cigarettes    Quit date: 08/03/2009    Years since quitting: 11.3  . Smokeless tobacco: Never Used  Vaping Use  . Vaping Use: Never used  Substance and Sexual Activity  . Alcohol use: Yes    Alcohol/week: 2.0 standard drinks    Types: 2 Glasses of wine per week  . Drug use: No  . Sexual activity: Not on file  Other Topics Concern  . Not on file  Social History Narrative   Widowed 2005   Quit smoking 2010   No other exposures, no TB hx   Social Determinants of Health   Financial Resource Strain: Low Risk   . Difficulty of Paying Living Expenses: Not hard at all  Food Insecurity: No Food Insecurity  . Worried About Charity fundraiser in the Last Year: Never true  . Ran Out of Food in the Last Year: Never true  Transportation Needs: No Transportation Needs  . Lack of Transportation (Medical): No  . Lack of Transportation (Non-Medical): No  Physical Activity: Insufficiently Active  . Days of Exercise per Week: 3 days  . Minutes of Exercise per Session: 30 min  Stress: No Stress Concern Present  . Feeling of Stress : Not at all  Social Connections: Moderately Isolated  . Frequency of Communication with Friends and Family: More than three times a week  . Frequency of Social Gatherings with Friends and Family: Once a week  . Attends Religious Services: More than 4 times per year  . Active Member of Clubs or Organizations: No  . Attends Archivist Meetings: Never  . Marital Status: Widowed  Intimate Partner Violence: Not At Risk  . Fear of Current or Ex-Partner: No  . Emotionally Abused: No  . Physically Abused: No  . Sexually Abused: No     Allergies  Allergen Reactions  . Crestor [Rosuvastatin Calcium]     cramping     Outpatient Medications Prior to  Visit  Medication Sig Dispense Refill  . acetaminophen (TYLENOL) 500 MG tablet Take 500 mg by mouth every 6 (six) hours as needed for headache (pain).     . Albuterol Sulfate (PROAIR RESPICLICK) 390 (90 Base) MCG/ACT AEPB Inhale 1 puff into the lungs 4 (four) times daily as needed (shortness of breath/ wheezing). 1 each 5  . aspirin EC 81 MG  tablet Take 81 mg by mouth at bedtime.    . budesonide (PULMICORT) 0.25 MG/2ML nebulizer solution USE 2ML (0.25 MG TOTAL) BY NEBULIZATION TWICE A DAY 360 mL 0  . Calcium Carb-Cholecalciferol (CALCIUM 1000 + D PO) Take 2 tablets by mouth daily.    . Ferrous Sulfate (IRON) 325 (65 Fe) MG TABS Take by mouth.    Marland Kitchen ipratropium-albuterol (DUONEB) 0.5-2.5 (3) MG/3ML SOLN Take 3 mLs by nebulization every 6 (six) hours as needed.    Marland Kitchen levothyroxine (SYNTHROID) 50 MCG tablet Take 1 tablet (50 mcg total) by mouth daily before breakfast. 90 tablet 0  . losartan (COZAAR) 100 MG tablet TAKE 1 TABLET BY MOUTH EVERY DAY 90 tablet 0  . metFORMIN (GLUCOPHAGE-XR) 500 MG 24 hr tablet TAKE 1 TABLET BY MOUTH EVERY DAY 90 tablet 0  . metoprolol succinate (TOPROL-XL) 25 MG 24 hr tablet TAKE 1 TABLET BY MOUTH EVERY DAY 90 tablet 0  . NIFEdipine (PROCARDIA-XL/NIFEDICAL-XL) 30 MG 24 hr tablet TAKE 1 TABLET BY MOUTH EVERY DAY IN THE MORNING 90 tablet 0  . pravastatin (PRAVACHOL) 20 MG tablet TAKE 1 TABLET BY MOUTH EVERY DAY 90 tablet 0  . Respiratory Therapy Supplies (NEBULIZER COMPRESSOR) KIT PLEASE DISPENSE 1 NEBULIZER MACHINE AND SUPPLIES DX: J44.9 1 each 0  . Tiotropium Bromide-Olodaterol (STIOLTO RESPIMAT) 2.5-2.5 MCG/ACT AERS INHALE 2 PUFFS BY MOUTH INTO THE LUNGS DAILY 4 g 4  . vitamin B-12 (CYANOCOBALAMIN) 100 MCG tablet Take 100 mcg by mouth daily.    Marland Kitchen ipratropium-albuterol (DUONEB) 0.5-2.5 (3) MG/3ML SOLN Take 3 mLs by nebulization every 4 (four) hours as needed. 360 mL 0   No facility-administered medications prior to visit.    Review of Systems  Constitutional:  Positive for weight loss. Negative for chills, fever and malaise/fatigue.  HENT: Negative for hearing loss, sore throat and tinnitus.   Eyes: Negative for blurred vision and double vision.  Respiratory: Positive for shortness of breath. Negative for cough, hemoptysis, sputum production, wheezing and stridor.   Cardiovascular: Negative for chest pain, palpitations, orthopnea, leg swelling and PND.  Gastrointestinal: Negative for abdominal pain, constipation, diarrhea, heartburn, nausea and vomiting.  Genitourinary: Negative for dysuria, hematuria and urgency.  Musculoskeletal: Negative for joint pain and myalgias.  Skin: Negative for itching and rash.  Neurological: Negative for dizziness, tingling, weakness and headaches.  Endo/Heme/Allergies: Negative for environmental allergies. Does not bruise/bleed easily.  Psychiatric/Behavioral: Negative for depression. The patient is not nervous/anxious and does not have insomnia.   All other systems reviewed and are negative.    Objective:  Physical Exam Vitals reviewed.  Constitutional:      General: She is not in acute distress.    Appearance: She is well-developed.  HENT:     Head: Normocephalic and atraumatic.     Mouth/Throat:     Pharynx: No oropharyngeal exudate.  Eyes:     Conjunctiva/sclera: Conjunctivae normal.     Pupils: Pupils are equal, round, and reactive to light.  Neck:     Vascular: No JVD.     Trachea: No tracheal deviation.     Comments: Loss of supraclavicular fat Cardiovascular:     Rate and Rhythm: Normal rate and regular rhythm.     Heart sounds: S1 normal and S2 normal.     Comments: Distant heart tones Pulmonary:     Effort: No tachypnea or accessory muscle usage.     Breath sounds: No stridor. Decreased breath sounds (throughout all lung fields) and wheezing present. No rhonchi or rales.  Abdominal:     General: Bowel sounds are normal. There is no distension.     Palpations: Abdomen is soft.      Tenderness: There is no abdominal tenderness.  Musculoskeletal:        General: Deformity (muscle wasting ) present.  Skin:    General: Skin is warm and dry.     Capillary Refill: Capillary refill takes less than 2 seconds.     Findings: No rash.  Neurological:     Mental Status: She is alert and oriented to person, place, and time.  Psychiatric:        Behavior: Behavior normal.      Vitals:   12/04/20 1448  BP: 130/62  Pulse: (!) 106  Temp: 98.2 F (36.8 C)  TempSrc: Temporal  SpO2: 93%  Weight: 102 lb 6.4 oz (46.4 kg)  Height: $Remove'5\' 5"'pGJyJZm$  (1.651 m)   93% on 4 pulse BMI Readings from Last 3 Encounters:  12/04/20 17.04 kg/m  03/15/20 17.64 kg/m  12/06/19 19.14 kg/m   Wt Readings from Last 3 Encounters:  12/04/20 102 lb 6.4 oz (46.4 kg)  03/15/20 106 lb (48.1 kg)  12/06/19 115 lb (52.2 kg)    CBC    Component Value Date/Time   WBC 5.8 04/12/2020 1044   RBC 3.16 (L) 04/12/2020 1044   HGB 9.7 (L) 04/12/2020 1044   HCT 30.2 (L) 04/12/2020 1044   PLT 195 04/12/2020 1044   MCV 95.6 04/12/2020 1044   MCH 30.7 04/12/2020 1044   MCHC 32.1 04/12/2020 1044   RDW 12.5 04/12/2020 1044   LYMPHSABS 1,392 04/12/2020 1044   MONOABS 0.8 01/09/2020 1135   EOSABS 180 04/12/2020 1044   BASOSABS 29 04/12/2020 1044    Chest Imaging:  07/18/2018: CT scan of the chest IMPRESSION: 1. No evidence acute pulmonary embolism. 2. No acute finding of the aorta or great vessels. 3. Interval increase in size of RIGHT consolidative mass adjacent to a pathologic rib fracture. Favor progressive inflammatory process versus indolent carcinoma. 4. Adjacent angular RIGHT upper lobe nodule also slightly increased in thickness from 09/02/2015. Favored benign inflammatory thickening.  08/08/2018 pet imaging. Low-level metabolic uptake within the right upper lobe consistent with benign inflammatory process. The patient's images have been independently reviewed by me.    Pulmonary Functions  Testing Results: PFT Results Latest Ref Rng & Units 07/19/2018  FVC-Pre L 0.89  FVC-Predicted Pre % 33  Pre FEV1/FVC % % 45  FEV1-Pre L 0.40  FEV1-Predicted Pre % 20       Assessment & Plan:      ICD-10-CM   1. Pulmonary emphysema, unspecified emphysema type ([place])  J43.9   2. Stage 4 very severe COPD by GOLD classification ([place])  J44.9   3. DNR (do not resuscitate)  Z66   4. History of radiation therapy  Z92.3   5. Chronic hypoxemic respiratory failure (HCC)  J96.11   6. Malignant neoplasm of right upper lobe of lung (HCC)  C34.11     Discussion:  This is a 87 year old female, very severe COPD, Gold stage IV FEV1 20% predicted.  BMI now of 17.  She has dropped slowly over the past year from a BMI of 19.  States that she is not been eating well she becomes very short of breath with food intake.  She had stage I lung cancer status post radiation by Dr. Lisbeth Renshaw in 2011 left with right upper lobe scarring.  Currently managed with Stiolto and as needed albuterol able  to complete most of her activities of daily living at home.  She still lives at home alone.  Plan: Continue albuterol shortness of breath and wheezing. Continue Stiolto as she prefers to stay on a mist inhaler. Focus on her dietary intake working towards at least 3 square meals a day.  Also encouraged her to consider supplementation with boost. Will get her at rest, room air and walking saturations for POC qualification. I suspect she will do fine with POC on 3 L pulsed at rest and 4 L with exertion. Orders placed for DME supply.   Current Outpatient Medications:  .  acetaminophen (TYLENOL) 500 MG tablet, Take 500 mg by mouth every 6 (six) hours as needed for headache (pain). , Disp: , Rfl:  .  Albuterol Sulfate (PROAIR RESPICLICK) 623 (90 Base) MCG/ACT AEPB, Inhale 1 puff into the lungs 4 (four) times daily as needed (shortness of breath/ wheezing)., Disp: 1 each, Rfl: 5 .  aspirin EC 81 MG tablet, Take 81 mg by mouth at  bedtime., Disp: , Rfl:  .  budesonide (PULMICORT) 0.25 MG/2ML nebulizer solution, USE 2ML (0.25 MG TOTAL) BY NEBULIZATION TWICE A DAY, Disp: 360 mL, Rfl: 0 .  Calcium Carb-Cholecalciferol (CALCIUM 1000 + D PO), Take 2 tablets by mouth daily., Disp: , Rfl:  .  Ferrous Sulfate (IRON) 325 (65 Fe) MG TABS, Take by mouth., Disp: , Rfl:  .  ipratropium-albuterol (DUONEB) 0.5-2.5 (3) MG/3ML SOLN, Take 3 mLs by nebulization every 6 (six) hours as needed., Disp: , Rfl:  .  levothyroxine (SYNTHROID) 50 MCG tablet, Take 1 tablet (50 mcg total) by mouth daily before breakfast., Disp: 90 tablet, Rfl: 0 .  losartan (COZAAR) 100 MG tablet, TAKE 1 TABLET BY MOUTH EVERY DAY, Disp: 90 tablet, Rfl: 0 .  metFORMIN (GLUCOPHAGE-XR) 500 MG 24 hr tablet, TAKE 1 TABLET BY MOUTH EVERY DAY, Disp: 90 tablet, Rfl: 0 .  metoprolol succinate (TOPROL-XL) 25 MG 24 hr tablet, TAKE 1 TABLET BY MOUTH EVERY DAY, Disp: 90 tablet, Rfl: 0 .  NIFEdipine (PROCARDIA-XL/NIFEDICAL-XL) 30 MG 24 hr tablet, TAKE 1 TABLET BY MOUTH EVERY DAY IN THE MORNING, Disp: 90 tablet, Rfl: 0 .  pravastatin (PRAVACHOL) 20 MG tablet, TAKE 1 TABLET BY MOUTH EVERY DAY, Disp: 90 tablet, Rfl: 0 .  Respiratory Therapy Supplies (NEBULIZER COMPRESSOR) KIT, PLEASE DISPENSE 1 NEBULIZER MACHINE AND SUPPLIES DX: J44.9, Disp: 1 each, Rfl: 0 .  Tiotropium Bromide-Olodaterol (STIOLTO RESPIMAT) 2.5-2.5 MCG/ACT AERS, INHALE 2 PUFFS BY MOUTH INTO THE LUNGS DAILY, Disp: 4 g, Rfl: 4 .  vitamin B-12 (CYANOCOBALAMIN) 100 MCG tablet, Take 100 mcg by mouth daily., Disp: , Rfl:  .  ipratropium-albuterol (DUONEB) 0.5-2.5 (3) MG/3ML SOLN, Take 3 mLs by nebulization every 4 (four) hours as needed., Disp: 360 mL, Rfl: 0   Garner Nash, DO [facility] 12/04/2020 2:58 PM

## 2020-12-22 DIAGNOSIS — J9610 Chronic respiratory failure, unspecified whether with hypoxia or hypercapnia: Secondary | ICD-10-CM | POA: Diagnosis not present

## 2021-01-16 ENCOUNTER — Other Ambulatory Visit: Payer: Self-pay | Admitting: Family Medicine

## 2021-01-17 ENCOUNTER — Other Ambulatory Visit: Payer: Self-pay | Admitting: Family Medicine

## 2021-01-20 ENCOUNTER — Other Ambulatory Visit: Payer: Self-pay | Admitting: Family Medicine

## 2021-01-20 NOTE — Telephone Encounter (Signed)
I will refill but it has been over a year since I have seen her. She needs to have office visit. Please schedule.

## 2021-01-22 DIAGNOSIS — J9610 Chronic respiratory failure, unspecified whether with hypoxia or hypercapnia: Secondary | ICD-10-CM | POA: Diagnosis not present

## 2021-01-26 ENCOUNTER — Other Ambulatory Visit: Payer: Self-pay | Admitting: Family Medicine

## 2021-02-02 ENCOUNTER — Other Ambulatory Visit: Payer: Self-pay | Admitting: Family Medicine

## 2021-02-07 ENCOUNTER — Other Ambulatory Visit: Payer: Self-pay

## 2021-02-07 MED ORDER — STIOLTO RESPIMAT 2.5-2.5 MCG/ACT IN AERS: INHALATION_SPRAY | RESPIRATORY_TRACT | 10 refills | Status: DC

## 2021-02-11 ENCOUNTER — Other Ambulatory Visit: Payer: Self-pay | Admitting: Family Medicine

## 2021-02-15 ENCOUNTER — Other Ambulatory Visit: Payer: Self-pay | Admitting: Family Medicine

## 2021-02-21 ENCOUNTER — Other Ambulatory Visit: Payer: Self-pay | Admitting: Family Medicine

## 2021-02-21 DIAGNOSIS — J9610 Chronic respiratory failure, unspecified whether with hypoxia or hypercapnia: Secondary | ICD-10-CM | POA: Diagnosis not present

## 2021-02-21 NOTE — Telephone Encounter (Signed)
Patient overdue for visit/labs. Has visit next week. Will refill 30 days and then additional refills pending lab results.

## 2021-02-26 ENCOUNTER — Other Ambulatory Visit: Payer: Self-pay | Admitting: Family Medicine

## 2021-02-27 ENCOUNTER — Other Ambulatory Visit: Payer: Self-pay

## 2021-02-28 ENCOUNTER — Encounter: Payer: Self-pay | Admitting: Family Medicine

## 2021-02-28 ENCOUNTER — Ambulatory Visit (INDEPENDENT_AMBULATORY_CARE_PROVIDER_SITE_OTHER): Payer: Medicare Other | Admitting: Family Medicine

## 2021-02-28 VITALS — BP 130/68 | HR 70 | Temp 98.7°F | Ht 65.0 in | Wt 102.6 lb

## 2021-02-28 DIAGNOSIS — I10 Essential (primary) hypertension: Secondary | ICD-10-CM

## 2021-02-28 DIAGNOSIS — J449 Chronic obstructive pulmonary disease, unspecified: Secondary | ICD-10-CM

## 2021-02-28 DIAGNOSIS — E538 Deficiency of other specified B group vitamins: Secondary | ICD-10-CM

## 2021-02-28 DIAGNOSIS — E559 Vitamin D deficiency, unspecified: Secondary | ICD-10-CM | POA: Diagnosis not present

## 2021-02-28 DIAGNOSIS — E1169 Type 2 diabetes mellitus with other specified complication: Secondary | ICD-10-CM | POA: Diagnosis not present

## 2021-02-28 DIAGNOSIS — E785 Hyperlipidemia, unspecified: Secondary | ICD-10-CM | POA: Diagnosis not present

## 2021-02-28 DIAGNOSIS — E118 Type 2 diabetes mellitus with unspecified complications: Secondary | ICD-10-CM | POA: Diagnosis not present

## 2021-02-28 DIAGNOSIS — R0902 Hypoxemia: Secondary | ICD-10-CM

## 2021-02-28 DIAGNOSIS — E890 Postprocedural hypothyroidism: Secondary | ICD-10-CM

## 2021-02-28 LAB — CBC WITH DIFFERENTIAL/PLATELET
Basophils Absolute: 0 10*3/uL (ref 0.0–0.1)
Basophils Relative: 0.6 % (ref 0.0–3.0)
Eosinophils Absolute: 0.1 10*3/uL (ref 0.0–0.7)
Eosinophils Relative: 1.5 % (ref 0.0–5.0)
HCT: 27.1 % — ABNORMAL LOW (ref 36.0–46.0)
Hemoglobin: 9 g/dL — ABNORMAL LOW (ref 12.0–15.0)
Lymphocytes Relative: 19.4 % (ref 12.0–46.0)
Lymphs Abs: 1 10*3/uL (ref 0.7–4.0)
MCHC: 33.2 g/dL (ref 30.0–36.0)
MCV: 96.8 fl (ref 78.0–100.0)
Monocytes Absolute: 0.6 10*3/uL (ref 0.1–1.0)
Monocytes Relative: 12 % (ref 3.0–12.0)
Neutro Abs: 3.4 10*3/uL (ref 1.4–7.7)
Neutrophils Relative %: 66.5 % (ref 43.0–77.0)
Platelets: 157 10*3/uL (ref 150.0–400.0)
RBC: 2.8 Mil/uL — ABNORMAL LOW (ref 3.87–5.11)
RDW: 14.5 % (ref 11.5–15.5)
WBC: 5.1 10*3/uL (ref 4.0–10.5)

## 2021-02-28 LAB — COMPREHENSIVE METABOLIC PANEL
ALT: 7 U/L (ref 0–35)
AST: 16 U/L (ref 0–37)
Albumin: 4.3 g/dL (ref 3.5–5.2)
Alkaline Phosphatase: 41 U/L (ref 39–117)
BUN: 18 mg/dL (ref 6–23)
CO2: 40 mEq/L — ABNORMAL HIGH (ref 19–32)
Calcium: 9.9 mg/dL (ref 8.4–10.5)
Chloride: 97 mEq/L (ref 96–112)
Creatinine, Ser: 0.71 mg/dL (ref 0.40–1.20)
GFR: 76.65 mL/min (ref >60.00)
Glucose, Bld: 102 mg/dL — ABNORMAL HIGH (ref 70–99)
Potassium: 4.5 mEq/L (ref 3.5–5.1)
Sodium: 141 mEq/L (ref 135–145)
Total Bilirubin: 0.3 mg/dL (ref 0.2–1.2)
Total Protein: 6.6 g/dL (ref 6.0–8.3)

## 2021-02-28 LAB — LIPID PANEL
Cholesterol: 172 mg/dL (ref 0–200)
HDL: 76.1 mg/dL (ref >39.00)
LDL Cholesterol: 76 mg/dL (ref 0–99)
NonHDL: 95.53
Total CHOL/HDL Ratio: 2
Triglycerides: 98 mg/dL (ref 0.0–149.0)
VLDL: 19.6 mg/dL (ref 0.0–40.0)

## 2021-02-28 LAB — FOLATE: Folate: 13.6 ng/mL (ref >5.9)

## 2021-02-28 LAB — HEMOGLOBIN A1C: Hgb A1c MFr Bld: 5.4 % (ref 4.6–6.5)

## 2021-02-28 LAB — VITAMIN D 25 HYDROXY (VIT D DEFICIENCY, FRACTURES): VITD: 46.18 ng/mL (ref 30.00–100.00)

## 2021-02-28 LAB — TSH: TSH: 1.04 u[IU]/mL (ref 0.35–5.50)

## 2021-02-28 LAB — VITAMIN B12: Vitamin B-12: 849 pg/mL (ref 211–911)

## 2021-02-28 MED ORDER — IPRATROPIUM-ALBUTEROL 0.5-2.5 (3) MG/3ML IN SOLN: 3.0000 mL | Freq: Four times a day (QID) | RESPIRATORY_TRACT | 1 refills | Status: DC | PRN

## 2021-02-28 MED ORDER — PROAIR RESPICLICK 108 (90 BASE) MCG/ACT IN AEPB: 1.0000 | INHALATION_SPRAY | Freq: Four times a day (QID) | RESPIRATORY_TRACT | 3 refills | Status: DC | PRN

## 2021-02-28 MED ORDER — STIOLTO RESPIMAT 2.5-2.5 MCG/ACT IN AERS: INHALATION_SPRAY | RESPIRATORY_TRACT | 3 refills | Status: DC

## 2021-02-28 MED ORDER — BUDESONIDE 0.25 MG/2ML IN SUSP: RESPIRATORY_TRACT | 3 refills | Status: DC

## 2021-02-28 MED ORDER — NIFEDIPINE ER OSMOTIC RELEASE 30 MG PO TB24: 30.0000 mg | ORAL_TABLET | Freq: Every day | ORAL | 3 refills | Status: DC

## 2021-02-28 MED ORDER — PRAVASTATIN SODIUM 20 MG PO TABS: 20.0000 mg | ORAL_TABLET | Freq: Every day | ORAL | 3 refills | Status: DC

## 2021-02-28 MED ORDER — METOPROLOL SUCCINATE ER 25 MG PO TB24: 25.0000 mg | ORAL_TABLET | Freq: Every day | ORAL | 3 refills | Status: DC

## 2021-02-28 MED ORDER — LOSARTAN POTASSIUM 100 MG PO TABS: 100.0000 mg | ORAL_TABLET | Freq: Every day | ORAL | 3 refills | Status: DC

## 2021-02-28 NOTE — Patient Instructions (Signed)
[email or web address]

## 2021-02-28 NOTE — Progress Notes (Signed)
Charlotte Harris DOB: [birth year] Encounter date: 02/28/2021  This is a 87 y.o. female who presents with Chief Complaint  Patient presents with   Follow-up    History of present illness:  "I'm doing alright". I have my days.   Here with daughter today.  *put battery in glucometer wrong and so waiting for daughter to come help her fix this. Appetite not good this week with sinus symptoms, but has been stable otherwise.   *Has been checking blood pressures at home. Getting 378 average systolic and 76 diastolic.  Occasionally up to 130 but usually back down with recheck.   *breathing ok if sitting. Does feel like it is worse than it was before. Worse with any activity. never used duoneb. Hard with walking any distance. [place] with walking around in house. Outside is harder. Wears oxygen all the time. Day and night. Wears when walking.   HTN: losartan 112m, toprol 235mdaily, nifedipine 3017mHL: pravastatin 37m48mypothyroid: synthroid 50mc80mily  Still supplementing b12, iron.   Arthritis: fingers, feet/ankles hurt sometimes. Uses the aspercreme which helps.   Blood sugars: no abnormals - 120's.    Allergies  Allergen Reactions   Crestor [Rosuvastatin Calcium]     cramping   Current Meds  Medication Sig   acetaminophen (TYLENOL) 500 MG tablet Take 500 mg by mouth every 6 (six) hours as needed for headache (pain).    Albuterol Sulfate (PROAIR RESPICLICK) 108 (588Base) MCG/ACT AEPB Inhale 1 puff into the lungs 4 (four) times daily as needed (shortness of breath/ wheezing).   aspirin EC 81 MG tablet Take 81 mg by mouth at bedtime.   budesonide (PULMICORT) 0.25 MG/2ML nebulizer solution USE 2ML (0.25 MG TOTAL) BY NEBULIZATION TWICE A DAY. NEED OFFICE VISIT FOR MORE REFILLS.   Calcium Carb-Cholecalciferol (CALCIUM 1000 + D PO) Take 2 tablets by mouth daily.   Ferrous Sulfate (IRON) 325 (65 Fe) MG TABS Take by mouth.   ipratropium-albuterol (DUONEB) 0.5-2.5 (3) MG/3ML SOLN Take  3 mLs by nebulization every 6 (six) hours as needed.   levothyroxine (SYNTHROID) 50 MCG tablet TAKE 1 TABLET BY MOUTH EVERY DAY BEFORE BREAKFAST   losartan (COZAAR) 100 MG tablet TAKE 1 TABLET BY MOUTH EVERY DAY   metFORMIN (GLUCOPHAGE-XR) 500 MG 24 hr tablet TAKE 1 TABLET BY MOUTH EVERY DAY   metoprolol succinate (TOPROL-XL) 25 MG 24 hr tablet TAKE 1 TABLET BY MOUTH EVERY DAY   NIFEdipine (PROCARDIA-XL/NIFEDICAL-XL) 30 MG 24 hr tablet TAKE 1 TABLET BY MOUTH EVERY DAY IN THE MORNING   pravastatin (PRAVACHOL) 20 MG tablet TAKE 1 TABLET BY MOUTH EVERY DAY   Respiratory Therapy Supplies (NEBULIZER COMPRESSOR) KIT PLEASE DISPENSE 1 NEBULIZER MACHINE AND SUPPLIES DX: J44.9   Tiotropium Bromide-Olodaterol (STIOLTO RESPIMAT) 2.5-2.5 MCG/ACT AERS INHALE 2 PUFFS BY MOUTH INTO THE LUNGS DAILY   vitamin B-12 (CYANOCOBALAMIN) 100 MCG tablet Take 100 mcg by mouth daily.    Review of Systems  Constitutional:  Negative for chills, fatigue and fever.  Respiratory:  Positive for shortness of breath (with exertion; wears O2 continuous 2L). Negative for cough, chest tightness and wheezing.   Cardiovascular:  Negative for chest pain, palpitations and leg swelling.   Objective:  BP 130/68 (BP Location: Left Arm, Patient Position: Sitting, Cuff Size: Normal)   Pulse 70   Temp 98.7 F (37.1 C) (Oral)   Ht _0  (1.651 m)   Wt 102 lb 9.6 oz (46.5 kg)   SpO2 95%   BMI  17.07 kg/m   Weight: 102 lb 9.6 oz (46.5 kg)   BP Readings from Last 3 Encounters:  02/28/21 130/68  12/04/20 130/62  03/15/20 129/73   Wt Readings from Last 3 Encounters:  02/28/21 102 lb 9.6 oz (46.5 kg)  12/04/20 102 lb 6.4 oz (46.4 kg)  03/15/20 106 lb (48.1 kg)    Physical Exam Constitutional:      General: She is not in acute distress.    Appearance: She is well-developed.  Cardiovascular:     Rate and Rhythm: Normal rate and regular rhythm.     Heart sounds: Normal heart sounds. No murmur heard.   No friction rub.   Pulmonary:     Effort: Pulmonary effort is normal. No respiratory distress.     Breath sounds: Normal breath sounds. No wheezing or rales.  Musculoskeletal:     Right lower leg: No edema.     Left lower leg: No edema.  Neurological:     Mental Status: She is alert and oriented to person, place, and time.  Psychiatric:        Behavior: Behavior normal.    Assessment/Plan 1. Chronic obstructive pulmonary disease, unspecified COPD type ([place]) Breathing has been stable.  She is oxygen dependent.  2 L flow.  We discussed DuoNeb treatment in case of increased difficulty with breathing and prescription was sent in for her to use via nebulizer she was not aware of the purpose of this medication when given before and it was not Explained to her that she can use in her machine. - Albuterol Sulfate (PROAIR RESPICLICK) 697 (90 Base) MCG/ACT AEPB; Inhale 1 puff into the lungs 4 (four) times daily as needed (shortness of breath/ wheezing).  Dispense: 2 each; Refill: 3  2. Hypertension, unspecified type Well-controlled.  Continue losartan 100 mg, Toprol 25 mg, nifedipine 30 mg. - CBC with Differential/Platelet; Future - Comprehensive metabolic panel; Future - CBC with Differential/Platelet - Comprehensive metabolic panel  3. Vitamin D deficiency Continue calcium with vitamin D. - VITAMIN D 25 Hydroxy (Vit-D Deficiency, Fractures); Future - VITAMIN D 25 Hydroxy (Vit-D Deficiency, Fractures)  4. Hypoxemia Oxygen dependent.  O2 sats have been stable.  5. Hyperlipidemia associated with type 2 diabetes mellitus (HCC) Continue pravastatin 20 mg daily. - Lipid panel; Future - Lipid panel  6. B12 deficiency - Vitamin B12; Future - Folate; Future - Vitamin B12 - Folate  7. Type II diabetes mellitus with complication (HCC) Has been well controlled.  Continue with metformin 500 mg daily. - Hemoglobin A1c; Future - Hemoglobin A1c  8. Hypothyroidism following radioiodine therapy Continue  Synthroid 50 mcg daily - TSH; Future - TSH    Return in about 6 months (around 08/31/2021) for Chronic condition visit.  40 minutes spent in chart review, time with patient, exam, charting.    Micheline Rough, MD

## 2021-03-05 ENCOUNTER — Other Ambulatory Visit: Payer: Self-pay | Admitting: Family Medicine

## 2021-03-05 DIAGNOSIS — E611 Iron deficiency: Secondary | ICD-10-CM

## 2021-03-06 ENCOUNTER — Other Ambulatory Visit (INDEPENDENT_AMBULATORY_CARE_PROVIDER_SITE_OTHER): Payer: Medicare Other

## 2021-03-06 DIAGNOSIS — E611 Iron deficiency: Secondary | ICD-10-CM

## 2021-03-06 LAB — FERRITIN: Ferritin: 15.1 ng/mL (ref 10.0–291.0)

## 2021-03-07 LAB — IRON AND TIBC
Iron Saturation: 15 % (ref 15–55)
Iron: 51 ug/dL (ref 27–139)
Total Iron Binding Capacity: 338 ug/dL (ref 250–450)
UIBC: 287 ug/dL (ref 118–369)

## 2021-03-18 ENCOUNTER — Other Ambulatory Visit: Payer: Self-pay | Admitting: Family Medicine

## 2021-03-24 DIAGNOSIS — J9610 Chronic respiratory failure, unspecified whether with hypoxia or hypercapnia: Secondary | ICD-10-CM | POA: Diagnosis not present

## 2021-03-25 ENCOUNTER — Ambulatory Visit (INDEPENDENT_AMBULATORY_CARE_PROVIDER_SITE_OTHER): Payer: Medicare Other

## 2021-03-25 ENCOUNTER — Other Ambulatory Visit: Payer: Self-pay

## 2021-03-25 DIAGNOSIS — Z0000 Encounter for general adult medical examination without abnormal findings: Secondary | ICD-10-CM | POA: Diagnosis not present

## 2021-03-25 NOTE — Progress Notes (Addendum)
Virtual Visit via Telephone Note  I connected with  Charlotte Harris on 03/25/21 at  1:45 PM EDT by telephone and verified that I am speaking with the correct person using two identifiers.  Medicare Annual Wellness visit completed telephonically due to Covid-19 pandemic.   Persons participating in this call: This Health Coach and this patient.   Location: Patient: Home Provider: Office   I discussed the limitations, risks, security and privacy concerns of performing an evaluation and management service by telephone and the availability of in person appointments. The patient expressed understanding and agreed to proceed.  Unable to perform video visit due to video visit attempted and failed and/or patient does not have video capability.   Some vital signs may be absent or patient reported.   Willette Brace, LPN   Subjective:   Charlotte Harris is a 87 y.o. female who presents for Medicare Annual (Subsequent) preventive examination.  Review of Systems     Cardiac Risk Factors include: advanced age (>70men, >85 women);hypertension;diabetes mellitus;dyslipidemia     Objective:    There were no vitals filed for this visit. There is no height or weight on file to calculate BMI.  Advanced Directives 03/25/2021 03/15/2020 08/09/2018 07/19/2018 07/18/2018 01/31/2018 01/16/2016  Does Patient Have a Medical Advance Directive? Yes Yes Yes Yes Yes No No  Type of Paramedic of [place];Living will [place];Living will Living will Living will - -  Does patient want to make changes to medical advance directive? - No - Patient declined No - Patient declined No - Patient declined - - -  Copy of [place] in Chart? Yes - validated most recent copy scanned in chart (See row information) Yes - validated most recent copy scanned in chart (See row information) - - - - -  Would patient like information on  creating a medical advance directive? - - No - Patient declined - No - Patient declined - -  Pre-existing out of facility DNR order (yellow form or pink MOST form) - - - - - - -    Current Medications (verified) Outpatient Encounter Medications as of 03/25/2021  Medication Sig   acetaminophen (TYLENOL) 500 MG tablet Take 500 mg by mouth every 6 (six) hours as needed for headache (pain).    Albuterol Sulfate (PROAIR RESPICLICK) 742 (90 Base) MCG/ACT AEPB Inhale 1 puff into the lungs 4 (four) times daily as needed (shortness of breath/ wheezing).   aspirin EC 81 MG tablet Take 81 mg by mouth at bedtime.   budesonide (PULMICORT) 0.25 MG/2ML nebulizer solution USE 2ML (0.25 MG TOTAL) BY NEBULIZATION TWICE A DAY.   Calcium Carb-Cholecalciferol (CALCIUM 1000 + D PO) Take 2 tablets by mouth daily.   ipratropium-albuterol (DUONEB) 0.5-2.5 (3) MG/3ML SOLN Take 3 mLs by nebulization every 6 (six) hours as needed.   levothyroxine (SYNTHROID) 50 MCG tablet TAKE 1 TABLET BY MOUTH EVERY DAY BEFORE BREAKFAST   losartan (COZAAR) 100 MG tablet Take 1 tablet (100 mg total) by mouth daily.   metFORMIN (GLUCOPHAGE-XR) 500 MG 24 hr tablet TAKE 1 TABLET BY MOUTH EVERY DAY   metoprolol succinate (TOPROL-XL) 25 MG 24 hr tablet Take 1 tablet (25 mg total) by mouth daily.   NIFEdipine (PROCARDIA-XL/NIFEDICAL-XL) 30 MG 24 hr tablet Take 1 tablet (30 mg total) by mouth daily.   pravastatin (PRAVACHOL) 20 MG tablet Take 1 tablet (20 mg total) by mouth daily.   Respiratory Therapy Supplies (  NEBULIZER COMPRESSOR) KIT PLEASE DISPENSE 1 NEBULIZER MACHINE AND SUPPLIES DX: J44.9   Tiotropium Bromide-Olodaterol (STIOLTO RESPIMAT) 2.5-2.5 MCG/ACT AERS INHALE 2 PUFFS BY MOUTH INTO THE LUNGS DAILY   vitamin B-12 (CYANOCOBALAMIN) 100 MCG tablet Take 100 mcg by mouth daily.   [DISCONTINUED] Ferrous Sulfate (IRON) 325 (65 Fe) MG TABS Take by mouth.   No facility-administered encounter medications on file as of 03/25/2021.     Allergies (verified) Crestor [rosuvastatin calcium]   History: Past Medical History:  Diagnosis Date   ADENOCARCINOMA, LUNG 07/03/2010   RUL   ASYMPTOMATIC POSTMENOPAUSAL STATUS 07/02/2009   Breast cancer ([place]) 1991   R mastectomy, no chemo or radiation   COLONIC POLYPS, HX OF 04/16/2007   COPD 05/17/2008   HEMATURIA, HX OF 04/16/2007   History of radiation therapy 07/30/09 to 08/09/09   RUL lung   HYPERLIPIDEMIA 04/16/2007   Hypertension    HYPERTENSION 04/16/2007   HYPOTHYROIDISM, POST-RADIATION 06/07/2008   Osteoarthritis    OSTEOPOROSIS 07/02/2009   Wrist fracture, left    Past Surgical History:  Procedure Laterality Date   BREAST SURGERY     lumpectomy   EYE SURGERY Bilateral    cataracts   LUNG BIOPSY  08/31/11   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   LUNG BIOPSY  05/22/2009   RUL lung=Adenocarcinoma   MASTECTOMY  1991   right   TONSILLECTOMY  1955   Family History  Problem Relation Age of Onset   Heart attack Mother 19   Heart disease Mother    Cancer Sister        "throat" cancer   Heart disease Father    Cancer Brother        blood   Heart disease Sister    Cancer Brother        blood   Heart disease Other        CAD   Hypertension Other    Colon cancer Neg Hx    Stomach cancer Neg Hx    Social History   Socioeconomic History   Marital status: Widowed    Spouse name: Not on file   Number of children: Not on file   Years of education: Not on file   Highest education level: Not on file  Occupational History   Occupation: Retired (worked Lexicographer)    Employer: RETIRED  Tobacco Use   Smoking status: Former    Packs/day: 1.00    Years: 50.00    Pack years: 50.00    Types: Cigarettes    Quit date: 08/03/2009    Years since quitting: 11.6   Smokeless tobacco: Never  Vaping Use   Vaping Use: Never used  Substance and Sexual Activity   Alcohol use: Yes    Alcohol/week: 2.0 standard drinks    Types: 2 Glasses of wine per week    Drug use: No   Sexual activity: Not on file  Other Topics Concern   Not on file  Social History Narrative   Widowed 2005   Quit smoking 2010   No other exposures, no TB hx   Social Determinants of Radio broadcast assistant Strain: Low Risk    Difficulty of Paying Living Expenses: Not hard at all  Food Insecurity: No Food Insecurity   Worried About Charity fundraiser in the Last Year: Never true   Arboriculturist in the Last Year: Never true  Transportation Needs: No Transportation Needs   Lack of Transportation (Medical): No  Lack of Transportation (Non-Medical): No  Physical Activity: Insufficiently Active   Days of Exercise per Week: 5 days   Minutes of Exercise per Session: 10 min  Stress: No Stress Concern Present   Feeling of Stress : Not at all  Social Connections: Moderately Isolated   Frequency of Communication with Friends and Family: More than three times a week   Frequency of Social Gatherings with Friends and Family: Twice a week   Attends Religious Services: 1 to 4 times per year   Active Member of [employer] or Organizations: No   Attends Archivist Meetings: Never   Marital Status: Widowed    Tobacco Counseling Counseling given: Not Answered   Clinical Intake:  Pre-visit preparation completed: Yes  Pain : No/denies pain     BMI - recorded: 17.07 Nutritional Status: BMI <19  Underweight Nutritional Risks: None Diabetes: Yes CBG done?: No (79) Did pt. bring in CBG monitor from home?: No  How often do you need to have someone help you when you read instructions, pamphlets, or other written materials from your doctor or pharmacy?: 1 - Never  Diabetic?Nutrition Risk Assessment:  Has the patient had any N/V/D within the last 2 months?  No  Does the patient have any non-healing wounds?  No  Has the patient had any unintentional weight loss or weight gain?  No   Diabetes:  Is the patient diabetic?  Yes  If diabetic, was a CBG obtained  today?  Yes  Did the patient bring in their glucometer from home?  No  How often do you monitor your CBG's? Daily.   Financial Strains and Diabetes Management:  Are you having any financial strains with the device, your supplies or your medication? No .  Does the patient want to be seen by Chronic Care Management for management of their diabetes?  No  Would the patient like to be referred to a Nutritionist or for Diabetic Management?  No   Diabetic Exams:  Diabetic eye exams 03/19/21  Diabetic Foot Exam: Overdue, Pt has been advised about the importance in completing this exam. Pt is scheduled for diabetic foot exam on next office appt.   Interpreter Needed?: No  Information entered by :: Charlott Rakes, LPN   Activities of Daily Living In your present state of health, do you have any difficulty performing the following activities: 03/25/2021  Hearing? N  Vision? N  Difficulty concentrating or making decisions? N  Walking or climbing stairs? Y  Comment on O2  Dressing or bathing? N  Doing errands, shopping? N  Preparing Food and eating ? N  Using the Toilet? N  In the past six months, have you accidently leaked urine? N  Do you have problems with loss of bowel control? N  Managing your Medications? N  Managing your Finances? N  Housekeeping or managing your Housekeeping? N  Some recent data might be hidden    Patient Care Team: Caren Macadam, MD as PCP - General (Family Medicine) Curt Bears, MD as Consulting Physician (Oncology) Kyung Rudd, MD as Consulting Physician (Radiation Oncology) Ladene Artist, MD as Consulting Physician (Gastroenterology) Webb Laws, Hector as Referring Physician (Optometry)  Indicate any recent Medical Services you may have received from other than [facility] providers in the past year (date may be approximate).     Assessment:   This is a routine wellness examination for [place].  Hearing/Vision screen Hearing Screening -  Comments:: Pt denies any hearing issues  Vision Screening -  Comments:: Pt follows up with Dr Harriette Bouillon for annual eye exams   Dietary issues and exercise activities discussed: Current Exercise Habits: Home exercise routine (leg exercise), Type of exercise: Other - see comments, Time (Minutes): 10, Frequency (Times/Week): 5, Weekly Exercise (Minutes/Week): 50   Goals Addressed             This Visit's Progress    Patient Stated       Gain weight        Depression Screen PHQ 2/9 Scores 03/25/2021 03/15/2020 12/07/2019 05/09/2015  PHQ - 2 Score 0 0 0 0  PHQ- 9 Score - 0 - -    Fall Risk Fall Risk  03/25/2021 03/15/2020 08/30/2014  Falls in the past year? 0 0 No  Number falls in past yr: 0 0 -  Injury with Fall? 0 0 -  Risk for fall due to : Impaired vision Medication side effect -  Follow up Falls prevention discussed Falls evaluation completed;Falls prevention discussed -    FALL RISK PREVENTION PERTAINING TO THE HOME:  Any stairs in or around the home? No  If so, are there any without handrails? No  Home free of loose throw rugs in walkways, pet beds, electrical cords, etc? Yes  Adequate lighting in your home to reduce risk of falls? Yes   ASSISTIVE DEVICES UTILIZED TO PREVENT FALLS:  Life alert? No  Use of a cane, walker or w/c? No  Grab bars in the bathroom? Yes  Shower chair or bench in shower? Yes  Elevated toilet seat or a handicapped toilet? No   TIMED UP AND GO:  Was the test performed? No .  Cognitive Function:     6CIT Screen 03/25/2021 03/15/2020  What Year? 0 points 0 points  What month? 0 points 0 points  What time? 0 points 0 points  Count back from 20 0 points 0 points  Months in reverse 0 points 0 points  Repeat phrase 0 points 0 points  Total Score 0 0    Immunizations Immunization History  Administered Date(s) Administered   Fluad Quad(high Dose 65+) 05/26/2019   Influenza Whole 06/03/2009, 05/03/2010   Influenza-Unspecified 04/04/2011,  05/03/2013, 05/03/2014, 04/19/2017, 04/17/2018   PFIZER(Purple Top)SARS-COV-2 Vaccination 08/26/2019, 09/16/2019, 08/01/2020, 03/02/2021   Pneumococcal Conjugate-13 08/16/2014   Pneumococcal Polysaccharide-23 06/04/1997, 07/03/2010   Td 01/01/1998   Tdap 11/15/2014   Zoster, Live 02/13/2014    TDAP status: Up to date  Flu Vaccine status: Due, Education has been provided regarding the importance of this vaccine. Advised may receive this vaccine at local pharmacy or Health Dept. Aware to provide a copy of the vaccination record if obtained from local pharmacy or Health Dept. Verbalized acceptance and understanding.  Pneumococcal vaccine status: Up to date  Covid-19 vaccine status: Completed vaccines  Qualifies for Shingles Vaccine? Yes   Zostavax completed Yes   Shingrix Completed?: No.    Education has been provided regarding the importance of this vaccine. Patient has been advised to call insurance company to determine out of pocket expense if they have not yet received this vaccine. Advised may also receive vaccine at local pharmacy or Health Dept. Verbalized acceptance and understanding.  Screening Tests Health Maintenance  Topic Date Due   FOOT EXAM  07/21/2018   INFLUENZA VACCINE  03/03/2021   Zoster Vaccines- Shingrix (1 of 2) 06/25/2021 (Originally 02/11/1951)   COVID-19 Vaccine (5 - Booster for Pfizer series) 07/02/2021   HEMOGLOBIN A1C  08/31/2021   OPHTHALMOLOGY EXAM  03/19/2022  TETANUS/TDAP  11/14/2024   DEXA SCAN  Completed   PNA vac Low Risk Adult  Completed   HPV VACCINES  Aged Out   COLONOSCOPY (Pts 45-24yrs Insurance coverage will need to be confirmed)  Discontinued    Health Maintenance  Health Maintenance Due  Topic Date Due   FOOT EXAM  07/21/2018   INFLUENZA VACCINE  03/03/2021    Colorectal cancer screening: No longer required.   Mammogram status: No longer required due to Age.  Bone Density status: Completed 02/18/18. Results reflect: Bone density  results: OSTEOPOROSIS. Repeat every 2 years.  Additional Screening:  Hepatitis C Screening: does qualify;   Vision Screening: Recommended annual ophthalmology exams for early detection of glaucoma and other disorders of the eye. Is the patient up to date with their annual eye exam?  Yes  Who is the provider or what is the name of the office in which the patient attends annual eye exams? Dr Einar Gip  If pt is not established with a provider, would they like to be referred to a provider to establish care? No .   Dental Screening: Recommended annual dental exams for proper oral hygiene  Community Resource Referral / Chronic Care Management: CRR required this visit?  No   CCM required this visit?  No      Plan:     I have personally reviewed and noted the following in the patient's chart:   Medical and social history Use of alcohol, tobacco or illicit drugs  Current medications and supplements including opioid prescriptions.  Functional ability and status Nutritional status Physical activity Advanced directives List of other physicians Hospitalizations, surgeries, and ER visits in previous 12 months Vitals Screenings to include cognitive, depression, and falls Referrals and appointments  In addition, I have reviewed and discussed with patient certain preventive protocols, quality metrics, and best practice recommendations. A written personalized care plan for preventive services as well as general preventive health recommendations were provided to patient.     Willette Brace, LPN   03/05/2121   Nurse Notes: None

## 2021-03-25 NOTE — Patient Instructions (Addendum)
Charlotte Harris , Thank you for taking time to come for your Medicare Wellness Visit. I appreciate your ongoing commitment to your health goals. Please review the following plan we discussed and let me know if I can assist you in the future.   Screening recommendations/referrals: Colonoscopy: No longer required  Mammogram: No Longer required  Bone Density: Done 02/18/18 repeat every 2 years  Recommended yearly ophthalmology/optometry visit for glaucoma screening and checkup Recommended yearly dental visit for hygiene and checkup  Vaccinations: Influenza vaccine: Due Pneumococcal vaccine: Completed  Tdap vaccine: Done 11/15/14  Shingles vaccine: Shingrix discussed. Please contact your pharmacy for coverage information.    Covid-19:Completed 1/23, 2/3, 08/01/20 & 03/02/21  Advanced directives: Copies in chart  Conditions/risks identified: Gain weight   Next appointment: Follow up in one year for your annual wellness visit    Preventive Care 65 Years and Older, Female Preventive care refers to lifestyle choices and visits with your health care provider that can promote health and wellness. What does preventive care include? A yearly physical exam. This is also called an annual well check. Dental exams once or twice a year. Routine eye exams. Ask your health care provider how often you should have your eyes checked. Personal lifestyle choices, including: Daily care of your teeth and gums. Regular physical activity. Eating a healthy diet. Avoiding tobacco and drug use. Limiting alcohol use. Practicing safe sex. Taking low-dose aspirin every day. Taking vitamin and mineral supplements as recommended by your health care provider. What happens during an annual well check? The services and screenings done by your health care provider during your annual well check will depend on your age, overall health, lifestyle risk factors, and family history of disease. Counseling  Your health care  provider may ask you questions about your: Alcohol use. Tobacco use. Drug use. Emotional well-being. Home and relationship well-being. Sexual activity. Eating habits. History of falls. Memory and ability to understand (cognition). Work and work Statistician. Reproductive health. Screening  You may have the following tests or measurements: Height, weight, and BMI. Blood pressure. Lipid and cholesterol levels. These may be checked every 5 years, or more frequently if you are over 68 years old. Skin check. Lung cancer screening. You may have this screening every year starting at age 59 if you have a 30-pack-year history of smoking and currently smoke or have quit within the past 15 years. Fecal occult blood test (FOBT) of the stool. You may have this test every year starting at age 62. Flexible sigmoidoscopy or colonoscopy. You may have a sigmoidoscopy every 5 years or a colonoscopy every 10 years starting at age 68. Hepatitis C blood test. Hepatitis B blood test. Sexually transmitted disease (STD) testing. Diabetes screening. This is done by checking your blood sugar (glucose) after you have not eaten for a while (fasting). You may have this done every 1-3 years. Bone density scan. This is done to screen for osteoporosis. You may have this done starting at age 58. Mammogram. This may be done every 1-2 years. Talk to your health care provider about how often you should have regular mammograms. Talk with your health care provider about your test results, treatment options, and if necessary, the need for more tests. Vaccines  Your health care provider may recommend certain vaccines, such as: Influenza vaccine. This is recommended every year. Tetanus, diphtheria, and acellular pertussis (Tdap, Td) vaccine. You may need a Td booster every 10 years. Zoster vaccine. You may need this after age 66. Pneumococcal 13-valent  conjugate (PCV13) vaccine. One dose is recommended after age  23. Pneumococcal polysaccharide (PPSV23) vaccine. One dose is recommended after age 46. Talk to your health care provider about which screenings and vaccines you need and how often you need them. This information is not intended to replace advice given to you by your health care provider. Make sure you discuss any questions you have with your health care provider. Document Released: 08/16/2015 Document Revised: 04/08/2016 Document Reviewed: 05/21/2015 Elsevier Interactive Patient Education  2017 [employer] Prevention in the Home Falls can cause injuries. They can happen to people of all ages. There are many things you can do to make your home safe and to help prevent falls. What can I do on the outside of my home? Regularly fix the edges of walkways and driveways and fix any cracks. Remove anything that might make you trip as you walk through a door, such as a raised step or threshold. Trim any bushes or trees on the path to your home. Use bright outdoor lighting. Clear any walking paths of anything that might make someone trip, such as rocks or tools. Regularly check to see if handrails are loose or broken. Make sure that both sides of any steps have handrails. Any raised decks and porches should have guardrails on the edges. Have any leaves, snow, or ice cleared regularly. Use sand or salt on walking paths during winter. Clean up any spills in your garage right away. This includes oil or grease spills. What can I do in the bathroom? Use night lights. Install grab bars by the toilet and in the tub and shower. Do not use towel bars as grab bars. Use non-skid mats or decals in the tub or shower. If you need to sit down in the shower, use a plastic, non-slip stool. Keep the floor dry. Clean up any water that spills on the floor as soon as it happens. Remove soap buildup in the tub or shower regularly. Attach bath mats securely with double-sided non-slip rug tape. Do not have throw  rugs and other things on the floor that can make you trip. What can I do in the bedroom? Use night lights. Make sure that you have a light by your bed that is easy to reach. Do not use any sheets or blankets that are too big for your bed. They should not hang down onto the floor. Have a firm chair that has side arms. You can use this for support while you get dressed. Do not have throw rugs and other things on the floor that can make you trip. What can I do in the kitchen? Clean up any spills right away. Avoid walking on wet floors. Keep items that you use a lot in easy-to-reach places. If you need to reach something above you, use a strong step stool that has a grab bar. Keep electrical cords out of the way. Do not use floor polish or wax that makes floors slippery. If you must use wax, use non-skid floor wax. Do not have throw rugs and other things on the floor that can make you trip. What can I do with my stairs? Do not leave any items on the stairs. Make sure that there are handrails on both sides of the stairs and use them. Fix handrails that are broken or loose. Make sure that handrails are as long as the stairways. Check any carpeting to make sure that it is firmly attached to the stairs. Fix any carpet that  is loose or worn. Avoid having throw rugs at the top or bottom of the stairs. If you do have throw rugs, attach them to the floor with carpet tape. Make sure that you have a light switch at the top of the stairs and the bottom of the stairs. If you do not have them, ask someone to add them for you. What else can I do to help prevent falls? Wear shoes that: Do not have high heels. Have rubber bottoms. Are comfortable and fit you well. Are closed at the toe. Do not wear sandals. If you use a stepladder: Make sure that it is fully opened. Do not climb a closed stepladder. Make sure that both sides of the stepladder are locked into place. Ask someone to hold it for you, if  possible. Clearly mark and make sure that you can see: Any grab bars or handrails. First and last steps. Where the edge of each step is. Use tools that help you move around (mobility aids) if they are needed. These include: Canes. Walkers. Scooters. Crutches. Turn on the lights when you go into a dark area. Replace any light bulbs as soon as they burn out. Set up your furniture so you have a clear path. Avoid moving your furniture around. If any of your floors are uneven, fix them. If there are any pets around you, be aware of where they are. Review your medicines with your doctor. Some medicines can make you feel dizzy. This can increase your chance of falling. Ask your doctor what other things that you can do to help prevent falls. This information is not intended to replace advice given to you by your health care provider. Make sure you discuss any questions you have with your health care provider. Document Released: 05/16/2009 Document Revised: 12/26/2015 Document Reviewed: 08/24/2014 Elsevier Interactive Patient Education  2017 [employer].

## 2021-04-24 DIAGNOSIS — J9610 Chronic respiratory failure, unspecified whether with hypoxia or hypercapnia: Secondary | ICD-10-CM | POA: Diagnosis not present

## 2021-05-14 ENCOUNTER — Telehealth: Payer: Self-pay | Admitting: Family Medicine

## 2021-05-14 NOTE — Telephone Encounter (Signed)
Meredith Mody is an NP from [place] and she states that she is calling to update the patient's PCP on her findings from her house call today.  She says the patient was checked for PAD and the findings came back abnormal. She says that the left foot was 0.39 and the right foot was 0.52. She states that the normal range is 0.9 in both feet.  She also states that the patient's BMI today was 16.  A call back number for Meredith Mody is [phone].  Please advise.

## 2021-05-16 NOTE — Telephone Encounter (Signed)
Patient informed of the message below.  Patient denies any problems mentioned, does not feel she needs further testing and agreed to follow up in February.

## 2021-05-16 NOTE — Telephone Encounter (Signed)
Check with patient. I am not surprised given age and smoking history that she has vascular disease. We can do more elaborate evaluation for this if desired. If she is not having issues with pain in legs/feet with walking or wound healing then I do not feel we have to further evaluate now. She has visit set with me in February - if they wanted to make this a little sooner for follow up they can. We will monitor weight as well.

## 2021-05-24 DIAGNOSIS — J9610 Chronic respiratory failure, unspecified whether with hypoxia or hypercapnia: Secondary | ICD-10-CM | POA: Diagnosis not present

## 2021-06-24 DIAGNOSIS — J9610 Chronic respiratory failure, unspecified whether with hypoxia or hypercapnia: Secondary | ICD-10-CM | POA: Diagnosis not present

## 2021-08-02 ENCOUNTER — Other Ambulatory Visit: Payer: Self-pay | Admitting: Family Medicine

## 2021-08-28 ENCOUNTER — Other Ambulatory Visit: Payer: Self-pay | Admitting: Family Medicine

## 2021-09-04 ENCOUNTER — Encounter: Payer: Self-pay | Admitting: Pulmonary Disease

## 2021-09-04 ENCOUNTER — Ambulatory Visit: Payer: Medicare Other | Admitting: Pulmonary Disease

## 2021-09-04 ENCOUNTER — Other Ambulatory Visit: Payer: Self-pay

## 2021-09-04 VITALS — BP 154/80 | HR 98 | Temp 98.2°F | Ht 65.0 in | Wt 103.8 lb

## 2021-09-04 DIAGNOSIS — J9611 Chronic respiratory failure with hypoxia: Secondary | ICD-10-CM | POA: Diagnosis not present

## 2021-09-04 DIAGNOSIS — J439 Emphysema, unspecified: Secondary | ICD-10-CM

## 2021-09-04 DIAGNOSIS — Z923 Personal history of irradiation: Secondary | ICD-10-CM

## 2021-09-04 DIAGNOSIS — J449 Chronic obstructive pulmonary disease, unspecified: Secondary | ICD-10-CM

## 2021-09-04 MED ORDER — ALBUTEROL SULFATE HFA 108 (90 BASE) MCG/ACT IN AERS: 2.0000 | INHALATION_SPRAY | Freq: Four times a day (QID) | RESPIRATORY_TRACT | 2 refills | Status: DC | PRN

## 2021-09-04 NOTE — Patient Instructions (Addendum)
Thank you for visiting Dr. Valeta Harms at [facility]. Today we recommend the following:  Meds ordered this encounter  Medications   albuterol (VENTOLIN HFA) 108 (90 Base) MCG/ACT inhaler    Sig: Inhale 2 puffs into the lungs every 6 (six) hours as needed for wheezing or shortness of breath.    Dispense:  8 g    Refill:  2   Continue current inhaler regimen   Return in about 1 year (around 09/04/2022) for w/ Dr. Valeta Harms .    Please do your part to reduce the spread of COVID-19.

## 2021-09-04 NOTE — Progress Notes (Signed)
Synopsis: Referred in Jan 2020 for very severe COPD by Caren Macadam, MD  Subjective:   PATIENT ID: Charlotte Harris GENDER: female DOB: [birth year], MRN: [id number]  Chief Complaint  Patient presents with   Follow-up    Follow up.     PMH of very severe COPD, postbronchodilator FEV1 20% predicted, RUL T1N0M0 lung cancer, s/p radiation by Dr. Lisbeth Renshaw. Recently admitted to [facility] for COPD exacerbation.  She was subsequently seen in hospital follow-up by Wyn Quaker.  She is currently managed with Stiolto and twice daily nebulized Pulmicort.  Baseline functional status she is able to complete most of her activities of daily living on 2 L nasal cannula O2 and occasionally increases to 3 L.  She does have some shortness of breath at baseline.  It does take her some time to get ready in the morning.  She does have daily cough.  Overall since her hospitalization she has noticed a significant improvement.  When discussing her goals of care today in the office.  She informs me that she would never want to be placed on mechanical life support if possible.  I asked if anyone has ever discussed that with her.  She said she recently had discussion with her daughter about this.  She would like to continue to do all treatments to help her breathing and continued normal functional status but would not want aggressive measures.  She knows that she has severe lung disease.  OV 02/01/2019: During her last office visit we discussed CODE STATUS and overall goals of care.  At the time she stated she would not want cardiopulmonary resuscitation.  DNR status was added to her problem list.  Today she is here for follow-up regarding her COPD.  Currently maintained on Stiolto and Pulmicort.  Patient had pet imaging in January 2020.  Revealed low level uptake within the right upper lobe consistent with inflammatory changes. Today she is excited to share that her breathing is doing great! She is using her inhalers regularly and  her HOT 3L pulse and 2L at rist. Denies fevers, chills, cough. Ordering groceries online and having them delivered.   OV 12/04/2020: Here today follow up.  From respiratory standpoint she is doing well.  Unfortunately she has been losing weight.  Her BMI is down to 17.  Here today for evaluation to walk with a POC.  She is stable on 3 L pulsed at rest and 4 L pulsed with exertion.  She should continue this.  Was walked today.  OV 09/04/2021: Here today for follow-up for management of COPD.  She has been doing really well for the past year.  Her BMI is still 17.  She has gained at least 1 pound.  She has been focusing on her nutritional intake.  She is trying to take in as many calories as she can.  Currently well managed with the Stiolto plus Pulmicort.  She uses her nebulizer at least twice a day as well.  Rarely using her albuterol.  She is able to complete most of her activities activities of daily living however it does take her an increasing amount of time to do them.  She has separate locations in the home where she has to sit and rest but she is at least able to get around.  Her daughter is with her today in the office.  We discussed progression of COPD with advanced lung disease and what to expect over time.   Past Medical History:  Diagnosis  Date   ADENOCARCINOMA, LUNG 07/03/2010   RUL   ASYMPTOMATIC POSTMENOPAUSAL STATUS 07/02/2009   Breast cancer ([place]) 1991   R mastectomy, no chemo or radiation   COLONIC POLYPS, HX OF 04/16/2007   COPD 05/17/2008   HEMATURIA, HX OF 04/16/2007   History of radiation therapy 07/30/09 to 08/09/09   RUL lung   HYPERLIPIDEMIA 04/16/2007   Hypertension    HYPERTENSION 04/16/2007   HYPOTHYROIDISM, POST-RADIATION 06/07/2008   Osteoarthritis    OSTEOPOROSIS 07/02/2009   Wrist fracture, left      Family History  Problem Relation Age of Onset   Heart attack Mother 90   Heart disease Mother    Cancer Sister        "throat" cancer   Heart disease Father     Cancer Brother        blood   Heart disease Sister    Cancer Brother        blood   Heart disease Other        CAD   Hypertension Other    Colon cancer Neg Hx    Stomach cancer Neg Hx      Past Surgical History:  Procedure Laterality Date   BREAST SURGERY     lumpectomy   EYE SURGERY Bilateral    cataracts   LUNG BIOPSY  08/31/11   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   LUNG BIOPSY  05/22/2009   RUL lung=Adenocarcinoma   MASTECTOMY  1991   right   TONSILLECTOMY  1955    Social History   Socioeconomic History   Marital status: Widowed    Spouse name: Not on file   Number of children: Not on file   Years of education: Not on file   Highest education level: Not on file  Occupational History   Occupation: Retired (worked Lexicographer)    Employer: RETIRED  Tobacco Use   Smoking status: Former    Packs/day: 1.00    Years: 50.00    Pack years: 50.00    Types: Cigarettes    Quit date: 08/03/2009    Years since quitting: 12.0   Smokeless tobacco: Never  Vaping Use   Vaping Use: Never used  Substance and Sexual Activity   Alcohol use: Yes    Alcohol/week: 2.0 standard drinks    Types: 2 Glasses of wine per week   Drug use: No   Sexual activity: Not on file  Other Topics Concern   Not on file  Social History Narrative   Widowed 2005   Quit smoking 2010   No other exposures, no TB hx   Social Determinants of Radio broadcast assistant Strain: Low Risk    Difficulty of Paying Living Expenses: Not hard at all  Food Insecurity: No Food Insecurity   Worried About Charity fundraiser in the Last Year: Never true   Arboriculturist in the Last Year: Never true  Transportation Needs: No Transportation Needs   Lack of Transportation (Medical): No   Lack of Transportation (Non-Medical): No  Physical Activity: Insufficiently Active   Days of Exercise per Week: 5 days   Minutes of Exercise per Session: 10 min  Stress: No Stress Concern Present   Feeling of  Stress : Not at all  Social Connections: Moderately Isolated   Frequency of Communication with Friends and Family: More than three times a week   Frequency of Social Gatherings with Friends and Family: Twice a week   Attends  Religious Services: 1 to 4 times per year   Active Member of Clubs or Organizations: No   Attends Archivist Meetings: Never   Marital Status: Widowed  Human resources officer Violence: Not At Risk   Fear of Current or Ex-Partner: No   Emotionally Abused: No   Physically Abused: No   Sexually Abused: No     Allergies  Allergen Reactions   Crestor [Rosuvastatin Calcium]     cramping     Outpatient Medications Prior to Visit  Medication Sig Dispense Refill   acetaminophen (TYLENOL) 500 MG tablet Take 500 mg by mouth every 6 (six) hours as needed for headache (pain).      Albuterol Sulfate (PROAIR RESPICLICK) 115 (90 Base) MCG/ACT AEPB Inhale 1 puff into the lungs 4 (four) times daily as needed (shortness of breath/ wheezing). 2 each 3   aspirin EC 81 MG tablet Take 81 mg by mouth at bedtime.     budesonide (PULMICORT) 0.25 MG/2ML nebulizer solution USE 2ML (0.25 MG TOTAL) BY NEBULIZATION TWICE A DAY. 180 mL 3   Calcium Carb-Cholecalciferol (CALCIUM 1000 + D PO) Take 2 tablets by mouth daily.     ipratropium-albuterol (DUONEB) 0.5-2.5 (3) MG/3ML SOLN Take 3 mLs by nebulization every 6 (six) hours as needed. 360 mL 1   levothyroxine (SYNTHROID) 50 MCG tablet TAKE 1 TABLET BY MOUTH EVERY DAY BEFORE BREAKFAST 90 tablet 1   losartan (COZAAR) 100 MG tablet Take 1 tablet (100 mg total) by mouth daily. 90 tablet 3   metFORMIN (GLUCOPHAGE-XR) 500 MG 24 hr tablet TAKE 1 TABLET BY MOUTH EVERY DAY 90 tablet 1   metoprolol succinate (TOPROL-XL) 25 MG 24 hr tablet Take 1 tablet (25 mg total) by mouth daily. 90 tablet 3   NIFEdipine (PROCARDIA-XL/NIFEDICAL-XL) 30 MG 24 hr tablet Take 1 tablet (30 mg total) by mouth daily. 90 tablet 3   pravastatin (PRAVACHOL) 20 MG tablet  Take 1 tablet (20 mg total) by mouth daily. 90 tablet 3   Respiratory Therapy Supplies (NEBULIZER COMPRESSOR) KIT PLEASE DISPENSE 1 NEBULIZER MACHINE AND SUPPLIES DX: J44.9 1 each 0   Tiotropium Bromide-Olodaterol (STIOLTO RESPIMAT) 2.5-2.5 MCG/ACT AERS INHALE 2 PUFFS BY MOUTH INTO THE LUNGS DAILY 12 g 3   vitamin B-12 (CYANOCOBALAMIN) 100 MCG tablet Take 100 mcg by mouth daily.     No facility-administered medications prior to visit.    Review of Systems  Constitutional:  Positive for weight loss. Negative for chills, fever and malaise/fatigue.  HENT:  Negative for hearing loss, sore throat and tinnitus.   Eyes:  Negative for blurred vision and double vision.  Respiratory:  Positive for shortness of breath. Negative for cough, hemoptysis, sputum production, wheezing and stridor.   Cardiovascular:  Negative for chest pain, palpitations, orthopnea, leg swelling and PND.  Gastrointestinal:  Negative for abdominal pain, constipation, diarrhea, heartburn, nausea and vomiting.  Genitourinary:  Negative for dysuria, hematuria and urgency.  Musculoskeletal:  Negative for joint pain and myalgias.  Skin:  Negative for itching and rash.  Neurological:  Negative for dizziness, tingling, weakness and headaches.  Endo/Heme/Allergies:  Negative for environmental allergies. Does not bruise/bleed easily.  Psychiatric/Behavioral:  Negative for depression. The patient is not nervous/anxious and does not have insomnia.   All other systems reviewed and are negative.   Objective:  Physical Exam Vitals reviewed.  Constitutional:      General: She is not in acute distress.    Appearance: She is well-developed.  HENT:     Head:  Normocephalic and atraumatic.     Mouth/Throat:     Pharynx: No oropharyngeal exudate.  Eyes:     Conjunctiva/sclera: Conjunctivae normal.     Pupils: Pupils are equal, round, and reactive to light.  Neck:     Vascular: No JVD.     Trachea: No tracheal deviation.      Comments: Loss of supraclavicular fat Cardiovascular:     Rate and Rhythm: Normal rate and regular rhythm.     Heart sounds: S1 normal and S2 normal.     Comments: Distant heart tones Pulmonary:     Effort: No tachypnea or accessory muscle usage.     Breath sounds: No stridor. Decreased breath sounds (throughout all lung fields) present. No wheezing, rhonchi or rales.     Comments: Diminished breath sounds bilaterally Abdominal:     General: There is no distension.     Palpations: Abdomen is soft.     Tenderness: There is no abdominal tenderness.  Musculoskeletal:        General: Deformity (muscle wasting ) present.  Skin:    General: Skin is warm and dry.     Capillary Refill: Capillary refill takes less than 2 seconds.     Findings: No rash.  Neurological:     Mental Status: She is alert and oriented to person, place, and time.  Psychiatric:        Behavior: Behavior normal.     Vitals:   09/04/21 1020  BP: (!) 154/80  Pulse: 98  Temp: 98.2 F (36.8 C)  TempSrc: Oral  SpO2: 98%  Weight: 103 lb 12.8 oz (47.1 kg)  Height: _0  (1.651 m)   98% on 4 pulse BMI Readings from Last 3 Encounters:  09/04/21 17.27 kg/m  02/28/21 17.07 kg/m  12/04/20 17.04 kg/m   Wt Readings from Last 3 Encounters:  09/04/21 103 lb 12.8 oz (47.1 kg)  02/28/21 102 lb 9.6 oz (46.5 kg)  12/04/20 102 lb 6.4 oz (46.4 kg)    CBC    Component Value Date/Time   WBC 5.1 02/28/2021 1458   RBC 2.80 (L) 02/28/2021 1458   HGB 9.0 (L) 02/28/2021 1458   HCT 27.1 (L) 02/28/2021 1458   PLT 157.0 02/28/2021 1458   MCV 96.8 02/28/2021 1458   MCH 30.7 04/12/2020 1044   MCHC 33.2 02/28/2021 1458   RDW 14.5 02/28/2021 1458   LYMPHSABS 1.0 02/28/2021 1458   MONOABS 0.6 02/28/2021 1458   EOSABS 0.1 02/28/2021 1458   BASOSABS 0.0 02/28/2021 1458    Chest Imaging:  07/18/2018: CT scan of the chest IMPRESSION: 1. No evidence acute pulmonary embolism. 2. No acute finding of the aorta or great  vessels. 3. Interval increase in size of RIGHT consolidative mass adjacent to a pathologic rib fracture. Favor progressive inflammatory process versus indolent carcinoma. 4. Adjacent angular RIGHT upper lobe nodule also slightly increased in thickness from 09/02/2015. Favored benign inflammatory thickening.  08/08/2018 pet imaging. Low-level metabolic uptake within the right upper lobe consistent with benign inflammatory process. The patient's images have been independently reviewed by me.    Pulmonary Functions Testing Results: PFT Results Latest Ref Rng & Units 07/19/2018  FVC-Pre L 0.89  FVC-Predicted Pre % 33  Pre FEV1/FVC % % 45  FEV1-Pre L 0.40  FEV1-Predicted Pre % 20       Assessment & Plan:      ICD-10-CM   1. Stage 4 very severe COPD by GOLD classification ([place])  J44.9  2. Pulmonary emphysema, unspecified emphysema type ([place])  J43.9     3. History of radiation therapy  Z92.3     4. Chronic hypoxemic respiratory failure (HCC)  J96.11       Discussion:  This is an 88 year old female, very severe COPD Gold stage IV FEV1 20% predicted, BMI of 17.  Her BMI has at least been stable for this past year.  She has had no recent hospitalizations and no exacerbations in the past year.  Plan: I think she needs to stay on her current regimen because it is obviously working. She can continue her oxygen therapy as prescribed. Encouraged her to maintain her weight and continue to eat as much as she could. She does well living on her own at the moment. She has routine and daily follow-up with her daughter. We will continue her current regimen to include Pulmicort plus Stiolto. Follow-up with [place] in 1 year or as needed.  I told her to make sure to give [place] a call if she has any change in her symptoms or feels like she is having exacerbation our goal is to try to keep her out of the hospital if at all possible.    Current Outpatient Medications:    acetaminophen (TYLENOL)  500 MG tablet, Take 500 mg by mouth every 6 (six) hours as needed for headache (pain). , Disp: , Rfl:    Albuterol Sulfate (PROAIR RESPICLICK) 330 (90 Base) MCG/ACT AEPB, Inhale 1 puff into the lungs 4 (four) times daily as needed (shortness of breath/ wheezing)., Disp: 2 each, Rfl: 3   aspirin EC 81 MG tablet, Take 81 mg by mouth at bedtime., Disp: , Rfl:    budesonide (PULMICORT) 0.25 MG/2ML nebulizer solution, USE 2ML (0.25 MG TOTAL) BY NEBULIZATION TWICE A DAY., Disp: 180 mL, Rfl: 3   Calcium Carb-Cholecalciferol (CALCIUM 1000 + D PO), Take 2 tablets by mouth daily., Disp: , Rfl:    ipratropium-albuterol (DUONEB) 0.5-2.5 (3) MG/3ML SOLN, Take 3 mLs by nebulization every 6 (six) hours as needed., Disp: 360 mL, Rfl: 1   levothyroxine (SYNTHROID) 50 MCG tablet, TAKE 1 TABLET BY MOUTH EVERY DAY BEFORE BREAKFAST, Disp: 90 tablet, Rfl: 1   losartan (COZAAR) 100 MG tablet, Take 1 tablet (100 mg total) by mouth daily., Disp: 90 tablet, Rfl: 3   metFORMIN (GLUCOPHAGE-XR) 500 MG 24 hr tablet, TAKE 1 TABLET BY MOUTH EVERY DAY, Disp: 90 tablet, Rfl: 1   metoprolol succinate (TOPROL-XL) 25 MG 24 hr tablet, Take 1 tablet (25 mg total) by mouth daily., Disp: 90 tablet, Rfl: 3   NIFEdipine (PROCARDIA-XL/NIFEDICAL-XL) 30 MG 24 hr tablet, Take 1 tablet (30 mg total) by mouth daily., Disp: 90 tablet, Rfl: 3   pravastatin (PRAVACHOL) 20 MG tablet, Take 1 tablet (20 mg total) by mouth daily., Disp: 90 tablet, Rfl: 3   Respiratory Therapy Supplies (NEBULIZER COMPRESSOR) KIT, PLEASE DISPENSE 1 NEBULIZER MACHINE AND SUPPLIES DX: J44.9, Disp: 1 each, Rfl: 0   Tiotropium Bromide-Olodaterol (STIOLTO RESPIMAT) 2.5-2.5 MCG/ACT AERS, INHALE 2 PUFFS BY MOUTH INTO THE LUNGS DAILY, Disp: 12 g, Rfl: 3   vitamin B-12 (CYANOCOBALAMIN) 100 MCG tablet, Take 100 mcg by mouth daily., Disp: , Rfl:    Garner Nash, DO [facility] 09/04/2021 10:34 AM

## 2021-09-05 ENCOUNTER — Ambulatory Visit: Payer: Medicare Other | Admitting: Family Medicine

## 2021-12-11 ENCOUNTER — Other Ambulatory Visit: Payer: Self-pay | Admitting: Family Medicine

## 2022-02-20 ENCOUNTER — Other Ambulatory Visit: Payer: Self-pay | Admitting: *Deleted

## 2022-02-20 MED ORDER — LEVOTHYROXINE SODIUM 50 MCG PO TABS: ORAL_TABLET | ORAL | 0 refills | Status: DC

## 2022-03-10 ENCOUNTER — Other Ambulatory Visit: Payer: Self-pay | Admitting: *Deleted

## 2022-03-10 MED ORDER — LOSARTAN POTASSIUM 100 MG PO TABS: 100.0000 mg | ORAL_TABLET | Freq: Every day | ORAL | 0 refills | Status: DC

## 2022-03-10 MED ORDER — METFORMIN HCL ER 500 MG PO TB24: 500.0000 mg | ORAL_TABLET | Freq: Every day | ORAL | 0 refills | Status: DC

## 2022-03-10 MED ORDER — NIFEDIPINE ER OSMOTIC RELEASE 30 MG PO TB24: 30.0000 mg | ORAL_TABLET | Freq: Every day | ORAL | 0 refills | Status: DC

## 2022-03-10 MED ORDER — PRAVASTATIN SODIUM 20 MG PO TABS: 20.0000 mg | ORAL_TABLET | Freq: Every day | ORAL | 0 refills | Status: DC

## 2022-03-10 NOTE — Telephone Encounter (Signed)
She hasn't been seen in a year -- ok to refill but she needs an appointment with me to establish

## 2022-03-11 ENCOUNTER — Other Ambulatory Visit: Payer: Self-pay | Admitting: Pulmonary Disease

## 2022-03-11 NOTE — Telephone Encounter (Signed)
Refill submitted. 

## 2022-03-13 ENCOUNTER — Other Ambulatory Visit: Payer: Self-pay | Admitting: *Deleted

## 2022-03-13 MED ORDER — METOPROLOL SUCCINATE ER 25 MG PO TB24: 25.0000 mg | ORAL_TABLET | Freq: Every day | ORAL | 0 refills | Status: DC

## 2022-03-13 NOTE — Telephone Encounter (Signed)
Ok to fill but it's been over a year since she has been seen. She will need  an appointment

## 2022-04-03 ENCOUNTER — Other Ambulatory Visit: Payer: Self-pay | Admitting: Family Medicine

## 2022-04-03 NOTE — Telephone Encounter (Signed)
I will fill but she hasn't been seen here in over a year. Please have her schedule an appointment with me soon. Thanks!

## 2022-04-11 ENCOUNTER — Other Ambulatory Visit: Payer: Self-pay | Admitting: Family Medicine

## 2022-04-14 ENCOUNTER — Ambulatory Visit: Payer: Medicare Other

## 2022-04-14 ENCOUNTER — Ambulatory Visit (INDEPENDENT_AMBULATORY_CARE_PROVIDER_SITE_OTHER): Payer: Medicare Other

## 2022-04-14 VITALS — Ht 65.0 in | Wt 102.0 lb

## 2022-04-14 DIAGNOSIS — Z0000 Encounter for general adult medical examination without abnormal findings: Secondary | ICD-10-CM | POA: Diagnosis not present

## 2022-04-14 NOTE — Patient Instructions (Signed)
[name] , Thank you for taking time to come for your Medicare Wellness Visit. I appreciate your ongoing commitment to your health goals. Please review the following plan we discussed and let me know if I can assist you in the future.   Screening recommendations/referrals: Colonoscopy: not required Mammogram: not required Bone Density: completed 02/18/2018 Recommended yearly ophthalmology/optometry visit for glaucoma screening and checkup Recommended yearly dental visit for hygiene and checkup  Vaccinations: Influenza vaccine: due Pneumococcal vaccine: completed 08/16/2014 Tdap vaccine: completed 11/15/2014, due 11/15/2022 Shingles vaccine: discussed   Covid-19: 03/02/2021, 08/01/2020, 09/16/2019, 08/26/2019  Advanced directives: copy in chart  Conditions/risks identified: none  Next appointment: Follow up in one year for your annual wellness visit    Preventive Care 30 Years and Older, Female Preventive care refers to lifestyle choices and visits with your health care provider that can promote health and wellness. What does preventive care include? A yearly physical exam. This is also called an annual well check. Dental exams once or twice a year. Routine eye exams. Ask your health care provider how often you should have your eyes checked. Personal lifestyle choices, including: Daily care of your teeth and gums. Regular physical activity. Eating a healthy diet. Avoiding tobacco and drug use. Limiting alcohol use. Practicing safe sex. Taking low-dose aspirin every day. Taking vitamin and mineral supplements as recommended by your health care provider. What happens during an annual well check? The services and screenings done by your health care provider during your annual well check will depend on your age, overall health, lifestyle risk factors, and family history of disease. Counseling  Your health care provider may ask you questions about your: Alcohol use. Tobacco  use. Drug use. Emotional well-being. Home and relationship well-being. Sexual activity. Eating habits. History of falls. Memory and ability to understand (cognition). Work and work Statistician. Reproductive health. Screening  You may have the following tests or measurements: Height, weight, and BMI. Blood pressure. Lipid and cholesterol levels. These may be checked every 5 years, or more frequently if you are over 32 years old. Skin check. Lung cancer screening. You may have this screening every year starting at age 18 if you have a 30-pack-year history of smoking and currently smoke or have quit within the past 15 years. Fecal occult blood test (FOBT) of the stool. You may have this test every year starting at age 9. Flexible sigmoidoscopy or colonoscopy. You may have a sigmoidoscopy every 5 years or a colonoscopy every 10 years starting at age 56. Hepatitis C blood test. Hepatitis B blood test. Sexually transmitted disease (STD) testing. Diabetes screening. This is done by checking your blood sugar (glucose) after you have not eaten for a while (fasting). You may have this done every 1-3 years. Bone density scan. This is done to screen for osteoporosis. You may have this done starting at age 53. Mammogram. This may be done every 1-2 years. Talk to your health care provider about how often you should have regular mammograms. Talk with your health care provider about your test results, treatment options, and if necessary, the need for more tests. Vaccines  Your health care provider may recommend certain vaccines, such as: Influenza vaccine. This is recommended every year. Tetanus, diphtheria, and acellular pertussis (Tdap, Td) vaccine. You may need a Td booster every 10 years. Zoster vaccine. You may need this after age 28. Pneumococcal 13-valent conjugate (PCV13) vaccine. One dose is recommended after age 34. Pneumococcal polysaccharide (PPSV23) vaccine. One dose is recommended  after  age 42. Talk to your health care provider about which screenings and vaccines you need and how often you need them. This information is not intended to replace advice given to you by your health care provider. Make sure you discuss any questions you have with your health care provider. Document Released: 08/16/2015 Document Revised: 04/08/2016 Document Reviewed: 05/21/2015 Elsevier Interactive Patient Education  2017 [place] Prevention in the Home Falls can cause injuries. They can happen to people of all ages. There are many things you can do to make your home safe and to help prevent falls. What can I do on the outside of my home? Regularly fix the edges of walkways and driveways and fix any cracks. Remove anything that might make you trip as you walk through a door, such as a raised step or threshold. Trim any bushes or trees on the path to your home. Use bright outdoor lighting. Clear any walking paths of anything that might make someone trip, such as rocks or tools. Regularly check to see if handrails are loose or broken. Make sure that both sides of any steps have handrails. Any raised decks and porches should have guardrails on the edges. Have any leaves, snow, or ice cleared regularly. Use sand or salt on walking paths during winter. Clean up any spills in your garage right away. This includes oil or grease spills. What can I do in the bathroom? Use night lights. Install grab bars by the toilet and in the tub and shower. Do not use towel bars as grab bars. Use non-skid mats or decals in the tub or shower. If you need to sit down in the shower, use a plastic, non-slip stool. Keep the floor dry. Clean up any water that spills on the floor as soon as it happens. Remove soap buildup in the tub or shower regularly. Attach bath mats securely with double-sided non-slip rug tape. Do not have throw rugs and other things on the floor that can make you trip. What can I do  in the bedroom? Use night lights. Make sure that you have a light by your bed that is easy to reach. Do not use any sheets or blankets that are too big for your bed. They should not hang down onto the floor. Have a firm chair that has side arms. You can use this for support while you get dressed. Do not have throw rugs and other things on the floor that can make you trip. What can I do in the kitchen? Clean up any spills right away. Avoid walking on wet floors. Keep items that you use a lot in easy-to-reach places. If you need to reach something above you, use a strong step stool that has a grab bar. Keep electrical cords out of the way. Do not use floor polish or wax that makes floors slippery. If you must use wax, use non-skid floor wax. Do not have throw rugs and other things on the floor that can make you trip. What can I do with my stairs? Do not leave any items on the stairs. Make sure that there are handrails on both sides of the stairs and use them. Fix handrails that are broken or loose. Make sure that handrails are as long as the stairways. Check any carpeting to make sure that it is firmly attached to the stairs. Fix any carpet that is loose or worn. Avoid having throw rugs at the top or bottom of the stairs. If you do have  throw rugs, attach them to the floor with carpet tape. Make sure that you have a light switch at the top of the stairs and the bottom of the stairs. If you do not have them, ask someone to add them for you. What else can I do to help prevent falls? Wear shoes that: Do not have high heels. Have rubber bottoms. Are comfortable and fit you well. Are closed at the toe. Do not wear sandals. If you use a stepladder: Make sure that it is fully opened. Do not climb a closed stepladder. Make sure that both sides of the stepladder are locked into place. Ask someone to hold it for you, if possible. Clearly mark and make sure that you can see: Any grab bars or  handrails. First and last steps. Where the edge of each step is. Use tools that help you move around (mobility aids) if they are needed. These include: Canes. Walkers. Scooters. Crutches. Turn on the lights when you go into a dark area. Replace any light bulbs as soon as they burn out. Set up your furniture so you have a clear path. Avoid moving your furniture around. If any of your floors are uneven, fix them. If there are any pets around you, be aware of where they are. Review your medicines with your doctor. Some medicines can make you feel dizzy. This can increase your chance of falling. Ask your doctor what other things that you can do to help prevent falls. This information is not intended to replace advice given to you by your health care provider. Make sure you discuss any questions you have with your health care provider. Document Released: 05/16/2009 Document Revised: 12/26/2015 Document Reviewed: 08/24/2014 Elsevier Interactive Patient Education  2017 [employer].

## 2022-04-14 NOTE — Progress Notes (Signed)
I connected with Charlotte Harris today by telephone and verified that I am speaking with the correct person using two identifiers. Location patient: home Location provider: work Persons participating in the virtual visit: Draven Laine, Glenna Durand LPN.   I discussed the limitations, risks, security and privacy concerns of performing an evaluation and management service by telephone and the availability of in person appointments. I also discussed with the patient that there may be a patient responsible charge related to this service. The patient expressed understanding and verbally consented to this telephonic visit.    Interactive audio and video telecommunications were attempted between this provider and patient, however failed, due to patient having technical difficulties OR patient did not have access to video capability.  We continued and completed visit with audio only.     Vital signs may be patient reported or missing.  Subjective:   Charlotte Harris is a 88 y.o. female who presents for Medicare Annual (Subsequent) preventive examination.  Review of Systems     Cardiac Risk Factors include: advanced age (>22mn, >>48women);diabetes mellitus;dyslipidemia;hypertension     Objective:    Today's Vitals   04/14/22 1326  Weight: 102 lb (46.3 kg)  Height: _0  (1.651 m)   Body mass index is 16.97 kg/m.     04/14/2022    1:33 PM 03/25/2021    1:53 PM 03/15/2020   11:22 AM 08/09/2018    2:11 PM 07/19/2018    8:00 AM 07/18/2018    3:28 PM 01/31/2018   10:12 AM  Advanced Directives  Does Patient Have a Medical Advance Directive? _1  Yes No  Type of AParamedicof [place]pper Bear [place] will Healthcare Power of AClaraLiving will [place]rollLiving will Living will Living will   Does patient want to make changes to medical advance directive?   No - Patient declined No - Patient declined No -  Patient declined    Copy of HI[place]zin Chart? Yes - validated most recent copy scanned in chart (See row information) Yes - validated most recent copy scanned in chart (See row information) Yes - validated most recent copy scanned in chart (See row information)      Would patient like information on creating a medical advance directive?    No - Patient declined  No - Patient declined     Current Medications (verified) Outpatient Encounter Medications as of 04/14/2022  Medication Sig   acetaminophen (TYLENOL) 500 MG tablet Take 500 mg by mouth every 6 (six) hours as needed for headache (pain).    albuterol (VENTOLIN HFA) 108 (90 Base) MCG/ACT inhaler Inhale 2 puffs into the lungs every 6 (six) hours as needed for wheezing or shortness of breath.   Albuterol Sulfate (PROAIR RESPICLICK) 1902(90 Base) MCG/ACT AEPB Inhale 1 puff into the lungs 4 (four) times daily as needed (shortness of breath/ wheezing).   aspirin EC 81 MG tablet Take 81 mg by mouth at bedtime.   budesonide (PULMICORT) 0.25 MG/2ML nebulizer solution USE 2ML (0.25 MG TOTAL) BY NEBULIZATION TWICE A DAY.   Calcium Carb-Cholecalciferol (CALCIUM 1000 + D PO) Take 2 tablets by mouth daily.   levothyroxine (SYNTHROID) 50 MCG tablet TAKE 1 TABLET BY MOUTH EVERY DAY BEFORE BREAKFAST   losartan (COZAAR) 100 MG tablet TAKE 1 TABLET BY MOUTH EVERY DAY   metFORMIN (GLUCOPHAGE-XR) 500 MG 24 hr tablet Take 1 tablet (500 mg total) by mouth daily.  metoprolol succinate (TOPROL-XL) 25 MG 24 hr tablet Take 1 tablet (25 mg total) by mouth daily.   NIFEdipine (PROCARDIA-XL/NIFEDICAL-XL) 30 MG 24 hr tablet TAKE 1 TABLET BY MOUTH EVERY DAY   pravastatin (PRAVACHOL) 20 MG tablet TAKE 1 TABLET BY MOUTH EVERY DAY   Respiratory Therapy Supplies (NEBULIZER COMPRESSOR) KIT PLEASE DISPENSE 1 NEBULIZER MACHINE AND SUPPLIES DX: J44.9   STIOLTO RESPIMAT 2.5-2.5 MCG/ACT AERS INHALE 2 PUFFS BY MOUTH INTO THE LUNGS DAILY   vitamin B-12  (CYANOCOBALAMIN) 100 MCG tablet Take 100 mcg by mouth daily.   ipratropium-albuterol (DUONEB) 0.5-2.5 (3) MG/3ML SOLN Take 3 mLs by nebulization every 6 (six) hours as needed.   No facility-administered encounter medications on file as of 04/14/2022.    Allergies (verified) Crestor [rosuvastatin calcium]   History: Past Medical History:  Diagnosis Date   ADENOCARCINOMA, LUNG 07/03/2010   RUL   ASYMPTOMATIC POSTMENOPAUSAL STATUS 07/02/2009   Breast cancer ([place]) 1991   R mastectomy, no chemo or radiation   COLONIC POLYPS, HX OF 04/16/2007   COPD 05/17/2008   HEMATURIA, HX OF 04/16/2007   History of radiation therapy 07/30/09 to 08/09/09   RUL lung   HYPERLIPIDEMIA 04/16/2007   Hypertension    HYPERTENSION 04/16/2007   HYPOTHYROIDISM, POST-RADIATION 06/07/2008   Osteoarthritis    OSTEOPOROSIS 07/02/2009   Wrist fracture, left    Past Surgical History:  Procedure Laterality Date   BREAST SURGERY     lumpectomy   EYE SURGERY Bilateral    cataracts   LUNG BIOPSY  08/31/11   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   LUNG BIOPSY  05/22/2009   RUL lung=Adenocarcinoma   MASTECTOMY  1991   right   TONSILLECTOMY  1955   Family History  Problem Relation Age of Onset   Heart attack Mother 50   Heart disease Mother    Cancer Sister        "throat" cancer   Heart disease Father    Cancer Brother        blood   Heart disease Sister    Cancer Brother        blood   Heart disease Other        CAD   Hypertension Other    Colon cancer Neg Hx    Stomach cancer Neg Hx    Social History   Socioeconomic History   Marital status: Widowed    Spouse name: Not on file   Number of children: Not on file   Years of education: Not on file   Highest education level: Not on file  Occupational History   Occupation: Retired (worked Lexicographer)    Employer: RETIRED  Tobacco Use   Smoking status: Former    Packs/day: 1.00    Years: 50.00    Total pack years: 50.00    Types:  Cigarettes    Quit date: 08/03/2009    Years since quitting: 12.7   Smokeless tobacco: Never  Vaping Use   Vaping Use: Never used  Substance and Sexual Activity   Alcohol use: Yes    Alcohol/week: 2.0 standard drinks of alcohol    Types: 2 Glasses of wine per week   Drug use: No   Sexual activity: Not on file  Other Topics Concern   Not on file  Social History Narrative   Widowed 2005   Quit smoking 2010   No other exposures, no TB hx   Social Determinants of Health   Financial Resource Strain: Low Risk  (  04/14/2022)   Overall Financial Resource Strain (CARDIA)    Difficulty of Paying Living Expenses: Not hard at all  Food Insecurity: No Food Insecurity (04/14/2022)   Hunger Vital Sign    Worried About Running Out of Food in the Last Year: Never true    Ran Out of Food in the Last Year: Never true  Transportation Needs: No Transportation Needs (04/14/2022)   PRAPARE - Hydrologist (Medical): No    Lack of Transportation (Non-Medical): No  Physical Activity: Inactive (04/14/2022)   Exercise Vital Sign    Days of Exercise per Week: 0 days    Minutes of Exercise per Session: 0 min  Stress: No Stress Concern Present (04/14/2022)   [place]    Feeling of Stress : Not at all  Social Connections: Moderately Isolated (03/25/2021)   Social Connection and Isolation Panel [NHANES]    Frequency of Communication with Friends and Family: More than three times a week    Frequency of Social Gatherings with Friends and Family: Twice a week    Attends Religious Services: 1 to 4 times per year    Active Member of [employer] or Organizations: No    Attends Archivist Meetings: Never    Marital Status: Widowed    Tobacco Counseling Counseling given: Not Answered   Clinical Intake:  Pre-visit preparation completed: Yes  Pain : No/denies pain     Nutritional Status: BMI <19   Underweight Nutritional Risks: None Diabetes: Yes  How often do you need to have someone help you when you read instructions, pamphlets, or other written materials from your doctor or pharmacy?: 1 - Never What is the last grade level you completed in school?: 12th grade  Diabetic? Yes Nutrition Risk Assessment:  Has the patient had any N/V/D within the last 2 months?  No  Does the patient have any non-healing wounds?  No  Has the patient had any unintentional weight loss or weight gain?  No   Diabetes:  Is the patient diabetic?  Yes  If diabetic, was a CBG obtained today?  No  Did the patient bring in their glucometer from home?  No  How often do you monitor your CBG's? Every other day.   Financial Strains and Diabetes Management:  Are you having any financial strains with the device, your supplies or your medication? No .  Does the patient want to be seen by Chronic Care Management for management of their diabetes?  No  Would the patient like to be referred to a Nutritionist or for Diabetic Management?  No   Diabetic Exams:  Diabetic Eye Exam: Overdue for diabetic eye exam. Pt has been advised about the importance in completing this exam. Patient advised to call and schedule an eye exam. Diabetic Foot Exam: Overdue, Pt has been advised about the importance in completing this exam. Pt is scheduled for diabetic foot exam on next appointment.   Interpreter Needed?: No  Information entered by :: NAllen LPN   Activities of Daily Living    04/14/2022    1:34 PM  In your present state of health, do you have any difficulty performing the following activities:  Hearing? 0  Vision? 0  Difficulty concentrating or making decisions? 0  Walking or climbing stairs? 1  Dressing or bathing? 0  Doing errands, shopping? 0  Preparing Food and eating ? N  Using the Toilet? N  In the past six  months, have you accidently leaked urine? N  Do you have problems with loss of bowel control? N   Managing your Medications? N  Managing your Finances? N  Housekeeping or managing your Housekeeping? N    Patient Care Team: Caren Macadam, MD (Inactive) as PCP - General (Family Medicine) Curt Bears, MD as Consulting Physician (Oncology) Kyung Rudd, MD as Consulting Physician (Radiation Oncology) Ladene Artist, MD as Consulting Physician (Gastroenterology) Webb Laws, [place] as Referring Physician (Optometry)  Indicate any recent Medical Services you may have received from other than [facility] providers in the past year (date may be approximate).     Assessment:   This is a routine wellness examination for [place].  Hearing/Vision screen Vision Screening - Comments:: Regular eye exams, Dr. Clifton James  Dietary issues and exercise activities discussed: Current Exercise Habits: The patient does not participate in regular exercise at present   Goals Addressed             This Visit's Progress    Patient Stated       04/14/2022, keep on living       Depression Screen    04/14/2022    1:34 PM 03/25/2021    1:52 PM 03/15/2020   11:23 AM 12/07/2019    1:13 PM 05/09/2015   11:37 AM  PHQ 2/9 Scores  PHQ - 2 Score 0 0 0 0 0  PHQ- 9 Score   0      Fall Risk    04/14/2022    1:34 PM 04/08/2022    2:19 PM 03/25/2021    1:54 PM 03/15/2020   11:22 AM 08/30/2014    4:42 PM  Fall Risk   Falls in the past year? 0 0 0 0 No  Number falls in past yr: 0  0 0   Injury with Fall? 0  0 0   Risk for fall due to : Medication side effect  Impaired vision Medication side effect   Follow up Falls prevention discussed;Education provided;Falls evaluation completed  Falls prevention discussed Falls evaluation completed;Falls prevention discussed     FALL RISK PREVENTION PERTAINING TO THE HOME:  Any stairs in or around the home? No  If so, are there any without handrails? N/a Home free of loose throw rugs in walkways, pet beds, electrical cords, etc? Yes  Adequate lighting in  your home to reduce risk of falls? Yes   ASSISTIVE DEVICES UTILIZED TO PREVENT FALLS:  Life alert? No  Use of a cane, walker or w/c? No  Grab bars in the bathroom? Yes  Shower chair or bench in shower? Yes  Elevated toilet seat or a handicapped toilet? No   TIMED UP AND GO:  Was the test performed? No .      Cognitive Function:        04/14/2022    1:36 PM 03/25/2021    1:56 PM 03/15/2020   11:28 AM  6CIT Screen  What Year? 0 points 0 points 0 points  What month? 0 points 0 points 0 points  What time? 0 points 0 points 0 points  Count back from 20 0 points 0 points 0 points  Months in reverse 0 points 0 points 0 points  Repeat phrase 0 points 0 points 0 points  Total Score 0 points 0 points 0 points    Immunizations Immunization History  Administered Date(s) Administered   Fluad Quad(high Dose 65+) 05/26/2019   Influenza Whole 06/03/2009, 05/03/2010   Influenza-Unspecified 04/04/2011, 05/03/2013, 05/03/2014, 04/19/2017,  04/17/2018   PFIZER(Purple Top)SARS-COV-2 Vaccination 08/26/2019, 09/16/2019, 08/01/2020, 03/02/2021   Pneumococcal Conjugate-13 08/16/2014   Pneumococcal Polysaccharide-23 06/04/1997, 07/03/2010   Td 01/01/1998   Tdap 11/15/2014   Zoster, Live 02/13/2014    TDAP status: Up to date  Flu Vaccine status: Due, Education has been provided regarding the importance of this vaccine. Advised may receive this vaccine at local pharmacy or Health Dept. Aware to provide a copy of the vaccination record if obtained from local pharmacy or Health Dept. Verbalized acceptance and understanding.  Pneumococcal vaccine status: Up to date  Covid-19 vaccine status: Completed vaccines  Qualifies for Shingles Vaccine? Yes   Zostavax completed Yes   Shingrix Completed?: No.    Education has been provided regarding the importance of this vaccine. Patient has been advised to call insurance company to determine out of pocket expense if they have not yet received this  vaccine. Advised may also receive vaccine at local pharmacy or Health Dept. Verbalized acceptance and understanding.  Screening Tests Health Maintenance  Topic Date Due   Zoster Vaccines- Shingrix (1 of 2) Never done   FOOT EXAM  07/21/2018   COVID-19 Vaccine (5 - Pfizer risk series) 04/27/2021   HEMOGLOBIN A1C  08/31/2021   INFLUENZA VACCINE  03/03/2022   OPHTHALMOLOGY EXAM  03/19/2022   TETANUS/TDAP  11/14/2024   Pneumonia Vaccine 62+ Years old  Completed   DEXA SCAN  Completed   HPV VACCINES  Aged Out   COLONOSCOPY (Pts 45-83yr Insurance coverage will need to be confirmed)  Discontinued    Health Maintenance  Health Maintenance Due  Topic Date Due   Zoster Vaccines- Shingrix (1 of 2) Never done   FOOT EXAM  07/21/2018   COVID-19 Vaccine (5 - Pfizer risk series) 04/27/2021   HEMOGLOBIN A1C  08/31/2021   INFLUENZA VACCINE  03/03/2022   OPHTHALMOLOGY EXAM  03/19/2022    Colorectal cancer screening: No longer required.   Mammogram status: No longer required due to age.  Bone Density status: Completed 02/18/2018.   Lung Cancer Screening: (Low Dose CT Chest recommended if Age 86-80years, 30 pack-year currently smoking OR have quit w/in 15years.) does not qualify.   Lung Cancer Screening Referral: no  Additional Screening:  Hepatitis C Screening: does not qualify;   Vision Screening: Recommended annual ophthalmology exams for early detection of glaucoma and other disorders of the eye. Is the patient up to date with their annual eye exam?  No  Who is the provider or what is the name of the office in which the patient attends annual eye exams? Dr. MClifton JamesIf pt is not established with a provider, would they like to be referred to a provider to establish care? No .   Dental Screening: Recommended annual dental exams for proper oral hygiene  Community Resource Referral / Chronic Care Management: CRR required this visit?  No   CCM required this visit?  No       Plan:     I have personally reviewed and noted the following in the patient's chart:   Medical and social history Use of alcohol, tobacco or illicit drugs  Current medications and supplements including opioid prescriptions. Patient is not currently taking opioid prescriptions. Functional ability and status Nutritional status Physical activity Advanced directives List of other physicians Hospitalizations, surgeries, and ER visits in previous 12 months Vitals Screenings to include cognitive, depression, and falls Referrals and appointments  In addition, I have reviewed and discussed with patient certain preventive protocols, quality metrics, and  best practice recommendations. A written personalized care plan for preventive services as well as general preventive health recommendations were provided to patient.     Kellie Simmering, LPN   03/20/5630   Nurse Notes: none  Due to this being a virtual visit, the after visit summary with patients personalized plan was offered to patient via mail or my-chart. Patient would like to access on my-chart

## 2022-04-16 ENCOUNTER — Telehealth (INDEPENDENT_AMBULATORY_CARE_PROVIDER_SITE_OTHER): Payer: Medicare Other | Admitting: Family Medicine

## 2022-04-16 ENCOUNTER — Encounter: Payer: Self-pay | Admitting: Family Medicine

## 2022-04-16 VITALS — BP 123/69 | HR 80 | Temp 98.6°F | Wt 102.0 lb

## 2022-04-16 DIAGNOSIS — D509 Iron deficiency anemia, unspecified: Secondary | ICD-10-CM | POA: Diagnosis not present

## 2022-04-16 DIAGNOSIS — E890 Postprocedural hypothyroidism: Secondary | ICD-10-CM | POA: Diagnosis not present

## 2022-04-16 DIAGNOSIS — E782 Mixed hyperlipidemia: Secondary | ICD-10-CM | POA: Diagnosis not present

## 2022-04-16 DIAGNOSIS — E118 Type 2 diabetes mellitus with unspecified complications: Secondary | ICD-10-CM | POA: Diagnosis not present

## 2022-04-16 DIAGNOSIS — I10 Essential (primary) hypertension: Secondary | ICD-10-CM | POA: Diagnosis not present

## 2022-04-16 MED ORDER — METFORMIN HCL ER 500 MG PO TB24: 500.0000 mg | ORAL_TABLET | Freq: Every day | ORAL | 3 refills | Status: DC

## 2022-04-16 MED ORDER — LEVOTHYROXINE SODIUM 50 MCG PO TABS: ORAL_TABLET | ORAL | 3 refills | Status: DC

## 2022-04-16 MED ORDER — NIFEDIPINE ER OSMOTIC RELEASE 30 MG PO TB24: 30.0000 mg | ORAL_TABLET | Freq: Every day | ORAL | 1 refills | Status: DC

## 2022-04-16 MED ORDER — METOPROLOL SUCCINATE ER 25 MG PO TB24: 25.0000 mg | ORAL_TABLET | Freq: Every day | ORAL | 1 refills | Status: DC

## 2022-04-16 MED ORDER — LOSARTAN POTASSIUM 100 MG PO TABS: 100.0000 mg | ORAL_TABLET | Freq: Every day | ORAL | 1 refills | Status: DC

## 2022-04-16 MED ORDER — PRAVASTATIN SODIUM 20 MG PO TABS: 20.0000 mg | ORAL_TABLET | Freq: Every day | ORAL | 1 refills | Status: DC

## 2022-04-16 NOTE — Progress Notes (Signed)
Established Patient Office Visit  Subjective   Patient ID: Charlotte Harris, female    DOB: 04/21/1932  Age: 88 y.o. MRN: [id number]  Chief Complaint  Patient presents with   Establish Care   I connected with  [name] on 04/16/22 by a video enabled telemedicine application and verified that I am speaking with the correct person using two identifiers.   I discussed the limitations of evaluation and management by telemedicine. The patient expressed understanding and agreed to proceed.   Patient location: her home residence Provider location: [facility] Patient is here for follow up and medication refills. She reports that she is unable to drive anymore and she was not able to come into the office. We are performing a video visit today and we spent 20 minutes in the face to face encounter today.  Patient reports she is doing well, she states that there are no new symptoms or issues to discuss. States that she is checking her blood pressures at home and also using her glucometer, her blood sugars are less than 200 all the time and her BP this morning was 767 systolic. She denies any dizziness or SOB, no recent falls, she is ambulating normally in her home. She is currently wearing her oxygen states that her breathing is at her baseline functioning.   We reviewed her current medications, and her problem list. She denies any side effects to the medications, reports compliance.  I reviewed her labs from last year, her A1C was well controlled and her labs were relatively normal except for a chronic anemia. Patient reports she used to take iron supplements however it caused GI upset so she stopped them. We discussed getting new annual bloodwork this year and she reports her daughter will be in town next month and can bring her to the office for bloodwork.   Current Outpatient Medications  Medication Instructions   acetaminophen (TYLENOL) 500 mg, Oral, Every 6 hours PRN    albuterol (VENTOLIN HFA) 108 (90 Base) MCG/ACT inhaler 2 puffs, Inhalation, Every 6 hours PRN   Albuterol Sulfate (PROAIR RESPICLICK) 209 (90 Base) MCG/ACT AEPB 1 puff, Inhalation, 4 times daily PRN   aspirin EC 81 mg, Oral, Daily at bedtime   budesonide (PULMICORT) 0.25 MG/2ML nebulizer solution USE 2ML (0.25 MG TOTAL) BY NEBULIZATION TWICE A DAY.   Calcium Carb-Cholecalciferol (CALCIUM 1000 + D PO) 2 tablets, Oral, Daily   levothyroxine (SYNTHROID) 50 MCG tablet TAKE 1 TABLET BY MOUTH EVERY DAY BEFORE BREAKFAST   losartan (COZAAR) 100 mg, Oral, Daily   metFORMIN (GLUCOPHAGE-XR) 500 mg, Oral, Daily   metoprolol succinate (TOPROL-XL) 25 mg, Oral, Daily   NIFEdipine (PROCARDIA-XL/NIFEDICAL-XL) 30 mg, Oral, Daily   pravastatin (PRAVACHOL) 20 mg, Oral, Daily   Respiratory Therapy Supplies (NEBULIZER COMPRESSOR) KIT PLEASE DISPENSE 1 NEBULIZER MACHINE AND SUPPLIES<BR>DX: J44.9   STIOLTO RESPIMAT 2.5-2.5 MCG/ACT AERS INHALE 2 PUFFS BY MOUTH INTO THE LUNGS DAILY   vitamin B-12 (CYANOCOBALAMIN) 100 mcg, Oral, Daily     Patient Active Problem List   Diagnosis Date Noted   DNR (do not resuscitate) 08/11/2018   Solitary pulmonary nodule 07/29/2018   Hypoxemia    Lung mass    Normocytic normochromic anemia 07/19/2018   Type II diabetes mellitus with complication ([place]) 47/04/6282   Mass of upper lobe of left lung    Acute on chronic respiratory failure with hypoxia ([place]) 07/18/2018   Weight loss 07/21/2017   Hip pain, right 01/25/2017   Vitamin D deficiency  07/16/2016   Side effects of treatment 11/15/2014   Diabetes ([place]) 07/10/2012   Malignant neoplasm of right upper lobe of lung ([place]) 03/05/2012   Breast cancer ([place])    History of radiation therapy    Cancer ([place])    Encounter for long-term (current) use of other medications 07/06/2011   Malignant neoplasm of bronchus and lung ([place]) 07/03/2010   TOBACCO USE, QUIT 07/03/2010   Osteoporosis 07/02/2009   ASYMPTOMATIC POSTMENOPAUSAL STATUS  07/02/2009   PERIPHERAL EDEMA 02/15/2009   Hypothyroidism following radioiodine therapy 06/07/2008   COPD GOLD IV B (based on 07/19/18 spiro, pfts ordered) 05/17/2008   Hyperlipidemia associated with type 2 diabetes mellitus ([place]) 04/16/2007   HTN (hypertension) 04/16/2007   BREAST CANCER, HX OF 04/16/2007   COLONIC POLYPS, HX OF 04/16/2007   HEMATURIA, HX OF 04/16/2007      Review of Systems  Constitutional:  Negative for malaise/fatigue and weight loss.  Respiratory:  Negative for cough and shortness of breath.   Cardiovascular:  Negative for chest pain.  All other systems reviewed and are negative.     Objective:     BP 123/69   Pulse 80   Temp 98.6 F (37 C)   Wt 102 lb (46.3 kg)   BMI 16.97 kg/m    Physical Exam Constitutional:      Appearance: Normal appearance. She is normal weight.  HENT:     Head: Normocephalic and atraumatic.  Eyes:     Conjunctiva/sclera: Conjunctivae normal.  Pulmonary:     Effort: Pulmonary effort is normal.  Skin:    General: Skin is warm and dry.  Neurological:     General: No focal deficit present.     Mental Status: She is alert and oriented to person, place, and time.  Psychiatric:        Mood and Affect: Mood normal.        Behavior: Behavior normal.        Judgment: Judgment normal.      No results found for any visits on 04/16/22.  Last CBC Lab Results  Component Value Date   WBC 5.1 02/28/2021   HGB 9.0 (L) 02/28/2021   HCT 27.1 (L) 02/28/2021   MCV 96.8 02/28/2021   MCH 30.7 04/12/2020   RDW 14.5 02/28/2021   PLT 157.0 76/22/6333   Last metabolic panel Lab Results  Component Value Date   GLUCOSE 102 (H) 02/28/2021   NA 141 02/28/2021   K 4.5 02/28/2021   CL 97 02/28/2021   CO2 40 (H) 02/28/2021   BUN 18 02/28/2021   CREATININE 0.71 02/28/2021   GFRNONAA NOT CALCULATED 07/20/2018   CALCIUM 9.9 02/28/2021   PROT 6.6 02/28/2021   ALBUMIN 4.3 02/28/2021   BILITOT 0.3 02/28/2021   ALKPHOS 41  02/28/2021   AST 16 02/28/2021   ALT 7 02/28/2021   ANIONGAP 8 07/20/2018   Last lipids Lab Results  Component Value Date   CHOL 172 02/28/2021   HDL 76.10 02/28/2021   LDLCALC 76 02/28/2021   LDLDIRECT 155.6 07/11/2013   TRIG 98.0 02/28/2021   CHOLHDL 2 02/28/2021   Last hemoglobin A1c Lab Results  Component Value Date   HGBA1C 5.4 02/28/2021   Last thyroid functions Lab Results  Component Value Date   TSH 1.04 02/28/2021      The ASCVD Risk score (Arnett DK, et al., 2019) failed to calculate for the following reasons:   The 2019 ASCVD risk score is only valid for ages 39 to  79    Assessment & Plan:   Problem List Items Addressed This Visit       Cardiovascular and Mediastinum   HTN (hypertension)    Current hypertension medications:       Sig   losartan (COZAAR) 100 MG tablet Take 1 tablet (100 mg total) by mouth daily.   metoprolol succinate (TOPROL-XL) 25 MG 24 hr tablet Take 1 tablet (25 mg total) by mouth daily.   NIFEdipine (PROCARDIA-XL/NIFEDICAL-XL) 30 MG 24 hr tablet Take 1 tablet (30 mg total) by mouth daily.  Appears well controlled with home readings reported today. Will continue the above listed medications as prescribed. Will need new CMP this year.      Relevant Medications   pravastatin (PRAVACHOL) 20 MG tablet   NIFEdipine (PROCARDIA-XL/NIFEDICAL-XL) 30 MG 24 hr tablet   metoprolol succinate (TOPROL-XL) 25 MG 24 hr tablet   losartan (COZAAR) 100 MG tablet   Other Relevant Orders   CMP     Endocrine   Hypothyroidism following radioiodine therapy    On 50 mcg levothyroxine, last TSH was WNL, will order new TSH, continue current meds      Relevant Medications   metoprolol succinate (TOPROL-XL) 25 MG 24 hr tablet   levothyroxine (SYNTHROID) 50 MCG tablet   Other Relevant Orders   TSH   Type II diabetes mellitus with complication (HCC) - Primary    Pt is reporting postprandial sugars from 113- 172, nothing over 200 on her readings at  home, will continue metformin 500 mg daily and order new A1C.      Relevant Medications   metFORMIN (GLUCOPHAGE-XR) 500 MG 24 hr tablet   pravastatin (PRAVACHOL) 20 MG tablet   losartan (COZAAR) 100 MG tablet   Other Relevant Orders   Hemoglobin A1c   Other Visit Diagnoses     Mixed hyperlipidemia       Relevant Medications   pravastatin (PRAVACHOL) 20 MG tablet   NIFEdipine (PROCARDIA-XL/NIFEDICAL-XL) 30 MG 24 hr tablet   metoprolol succinate (TOPROL-XL) 25 MG 24 hr tablet   losartan (COZAAR) 100 MG tablet   Iron deficiency anemia, unspecified iron deficiency anemia type       Relevant Orders   CBC with Differential/Platelets  Patient's last CBC showed Hb of 9.0, will get new CBC today-- she may need to restart her iron supplements.     Return in about 6 months (around 10/15/2022) for follow.    Farrel Conners, MD

## 2022-04-16 NOTE — Assessment & Plan Note (Signed)
Pt is reporting postprandial sugars from 113- 172, nothing over 200 on her readings at home, will continue metformin 500 mg daily and order new A1C.

## 2022-04-16 NOTE — Assessment & Plan Note (Signed)
On 50 mcg levothyroxine, last TSH was WNL, will order new TSH, continue current meds

## 2022-04-16 NOTE — Assessment & Plan Note (Signed)
Current hypertension medications:      Sig   losartan (COZAAR) 100 MG tablet Take 1 tablet (100 mg total) by mouth daily.   metoprolol succinate (TOPROL-XL) 25 MG 24 hr tablet Take 1 tablet (25 mg total) by mouth daily.   NIFEdipine (PROCARDIA-XL/NIFEDICAL-XL) 30 MG 24 hr tablet Take 1 tablet (30 mg total) by mouth daily.    Appears well controlled with home readings reported today. Will continue the above listed medications as prescribed. Will need new CMP this year.

## 2022-05-11 ENCOUNTER — Other Ambulatory Visit (INDEPENDENT_AMBULATORY_CARE_PROVIDER_SITE_OTHER): Payer: Medicare Other

## 2022-05-11 DIAGNOSIS — I10 Essential (primary) hypertension: Secondary | ICD-10-CM | POA: Diagnosis not present

## 2022-05-11 DIAGNOSIS — E890 Postprocedural hypothyroidism: Secondary | ICD-10-CM | POA: Diagnosis not present

## 2022-05-11 DIAGNOSIS — E118 Type 2 diabetes mellitus with unspecified complications: Secondary | ICD-10-CM

## 2022-05-11 DIAGNOSIS — D509 Iron deficiency anemia, unspecified: Secondary | ICD-10-CM

## 2022-05-11 LAB — COMPREHENSIVE METABOLIC PANEL
ALT: 8 U/L (ref 0–35)
AST: 20 U/L (ref 0–37)
Albumin: 4.5 g/dL (ref 3.5–5.2)
Alkaline Phosphatase: 44 U/L (ref 39–117)
BUN: 21 mg/dL (ref 6–23)
CO2: 38 mEq/L — ABNORMAL HIGH (ref 19–32)
Calcium: 10.2 mg/dL (ref 8.4–10.5)
Chloride: 99 mEq/L (ref 96–112)
Creatinine, Ser: 0.8 mg/dL (ref 0.40–1.20)
GFR: 65.87 mL/min (ref >60.00)
Glucose, Bld: 117 mg/dL — ABNORMAL HIGH (ref 70–99)
Potassium: 4.4 mEq/L (ref 3.5–5.1)
Sodium: 145 mEq/L (ref 135–145)
Total Bilirubin: 0.4 mg/dL (ref 0.2–1.2)
Total Protein: 7.1 g/dL (ref 6.0–8.3)

## 2022-05-11 LAB — CBC WITH DIFFERENTIAL/PLATELET
Basophils Absolute: 0 10*3/uL (ref 0.0–0.1)
Basophils Relative: 0.3 % (ref 0.0–3.0)
Eosinophils Absolute: 0.1 10*3/uL (ref 0.0–0.7)
Eosinophils Relative: 1.5 % (ref 0.0–5.0)
HCT: 29.1 % — ABNORMAL LOW (ref 36.0–46.0)
Hemoglobin: 9.5 g/dL — ABNORMAL LOW (ref 12.0–15.0)
Lymphocytes Relative: 20.9 % (ref 12.0–46.0)
Lymphs Abs: 1.4 10*3/uL (ref 0.7–4.0)
MCHC: 32.7 g/dL (ref 30.0–36.0)
MCV: 97.2 fl (ref 78.0–100.0)
Monocytes Absolute: 0.8 10*3/uL (ref 0.1–1.0)
Monocytes Relative: 11.4 % (ref 3.0–12.0)
Neutro Abs: 4.6 10*3/uL (ref 1.4–7.7)
Neutrophils Relative %: 65.9 % (ref 43.0–77.0)
Platelets: 187 10*3/uL (ref 150.0–400.0)
RBC: 2.99 Mil/uL — ABNORMAL LOW (ref 3.87–5.11)
RDW: 14.6 % (ref 11.5–15.5)
WBC: 6.9 10*3/uL (ref 4.0–10.5)

## 2022-05-11 LAB — TSH: TSH: 1.54 u[IU]/mL (ref 0.35–5.50)

## 2022-05-11 LAB — HEMOGLOBIN A1C: Hgb A1c MFr Bld: 5.7 % (ref 4.6–6.5)

## 2022-05-14 ENCOUNTER — Ambulatory Visit (INDEPENDENT_AMBULATORY_CARE_PROVIDER_SITE_OTHER): Payer: Medicare Other | Admitting: Family Medicine

## 2022-05-14 ENCOUNTER — Encounter: Payer: Self-pay | Admitting: Family Medicine

## 2022-05-14 VITALS — BP 132/80 | HR 103 | Temp 99.0°F | Ht 65.0 in | Wt 100.1 lb

## 2022-05-14 DIAGNOSIS — E118 Type 2 diabetes mellitus with unspecified complications: Secondary | ICD-10-CM | POA: Diagnosis not present

## 2022-05-14 DIAGNOSIS — I10 Essential (primary) hypertension: Secondary | ICD-10-CM

## 2022-05-14 DIAGNOSIS — R636 Underweight: Secondary | ICD-10-CM | POA: Diagnosis not present

## 2022-05-14 DIAGNOSIS — Z23 Encounter for immunization: Secondary | ICD-10-CM | POA: Diagnosis not present

## 2022-05-14 NOTE — Progress Notes (Signed)
Established Patient Office Visit  Subjective   Patient ID: Charlotte Harris, female    DOB: 23-Nov-1931  Age: 88 y.o. MRN: [id number]  Chief Complaint  Patient presents with   Establish Care    Patient is here for transition of care visit.   She is here with her daughter today, daughter is reporting that there is more labored breathing happening. She is also reporting decreased appetite. Patient states she is at her baseline, states that often it is difficult for her to eat due to the dyspnea. She denies any fever/chills, no chest pain, no sputum production, states she is compliant with her oxygen and her inhalers and nebs, sees her pulmonary physician regularly    Current Outpatient Medications  Medication Instructions   acetaminophen (TYLENOL) 500 mg, Oral, Every 6 hours PRN   albuterol (VENTOLIN HFA) 108 (90 Base) MCG/ACT inhaler 2 puffs, Inhalation, Every 6 hours PRN   Albuterol Sulfate (PROAIR RESPICLICK) 915 (90 Base) MCG/ACT AEPB 1 puff, Inhalation, 4 times daily PRN   aspirin EC 81 mg, Oral, Daily at bedtime   budesonide (PULMICORT) 0.25 MG/2ML nebulizer solution USE 2ML (0.25 MG TOTAL) BY NEBULIZATION TWICE A DAY.   Calcium Carb-Cholecalciferol (CALCIUM 1000 + D PO) 2 tablets, Oral, Daily   levothyroxine (SYNTHROID) 50 MCG tablet TAKE 1 TABLET BY MOUTH EVERY DAY BEFORE BREAKFAST   losartan (COZAAR) 100 mg, Oral, Daily   metFORMIN (GLUCOPHAGE-XR) 500 mg, Oral, Daily   metoprolol succinate (TOPROL-XL) 25 mg, Oral, Daily   NIFEdipine (PROCARDIA-XL/NIFEDICAL-XL) 30 mg, Oral, Daily   pravastatin (PRAVACHOL) 20 mg, Oral, Daily   Respiratory Therapy Supplies (NEBULIZER COMPRESSOR) KIT PLEASE DISPENSE 1 NEBULIZER MACHINE AND SUPPLIES<BR>DX: J44.9   STIOLTO RESPIMAT 2.5-2.5 MCG/ACT AERS INHALE 2 PUFFS BY MOUTH INTO THE LUNGS DAILY   vitamin B-12 (CYANOCOBALAMIN) 100 mcg, Oral, Daily    Patient Active Problem List   Diagnosis Date Noted   Underweight due to inadequate caloric  intake 05/15/2022   DNR (do not resuscitate) 08/11/2018   Solitary pulmonary nodule 07/29/2018   Hypoxemia    Lung mass    Normocytic normochromic anemia 07/19/2018   Type II diabetes mellitus with complication ([place]) 05/69/7948   Mass of upper lobe of left lung    Acute on chronic respiratory failure with hypoxia ([place]) 07/18/2018   Weight loss 07/21/2017   Hip pain, right 01/25/2017   Vitamin D deficiency 07/16/2016   Side effects of treatment 11/15/2014   Diabetes ([place]) 07/10/2012   Malignant neoplasm of right upper lobe of lung ([place]) 03/05/2012   Breast cancer ([place])    History of radiation therapy    Cancer ([place])    Encounter for long-term (current) use of other medications 07/06/2011   Malignant neoplasm of bronchus and lung ([place]) 07/03/2010   TOBACCO USE, QUIT 07/03/2010   Osteoporosis 07/02/2009   ASYMPTOMATIC POSTMENOPAUSAL STATUS 07/02/2009   PERIPHERAL EDEMA 02/15/2009   Hypothyroidism following radioiodine therapy 06/07/2008   COPD GOLD IV B (based on 07/19/18 spiro, pfts ordered) 05/17/2008   Hyperlipidemia associated with type 2 diabetes mellitus ([place]) 04/16/2007   HTN (hypertension) 04/16/2007   BREAST CANCER, HX OF 04/16/2007   COLONIC POLYPS, HX OF 04/16/2007   HEMATURIA, HX OF 04/16/2007      Review of Systems  All other systems reviewed and are negative.     Objective:     BP 132/80 (BP Location: Left Arm, Patient Position: Sitting, Cuff Size: Normal)   Pulse (!) 103   Temp 99 F (  37.2 C) (Oral)   Ht $R'5\' 5"'WW$  (1.651 m)   Wt 100 lb 1.6 oz (45.4 kg)   SpO2 95%   BMI 16.66 kg/m    Physical Exam Vitals reviewed.  Constitutional:      Appearance: Normal appearance. She is well-groomed and underweight.  Eyes:     Conjunctiva/sclera: Conjunctivae normal.  Neck:     Thyroid: No thyromegaly.  Cardiovascular:     Rate and Rhythm: Normal rate and regular rhythm.     Pulses: Normal pulses.     Heart sounds: S1 normal and S2 normal.  Pulmonary:      Effort: Pulmonary effort is normal. Prolonged expiration present.     Breath sounds: Decreased breath sounds present.  Abdominal:     General: Bowel sounds are normal.  Musculoskeletal:     Right lower leg: No edema.     Left lower leg: No edema.  Neurological:     Mental Status: She is alert and oriented to person, place, and time. Mental status is at baseline.  Psychiatric:        Mood and Affect: Mood and affect normal.        Speech: Speech normal.        Behavior: Behavior normal.        Judgment: Judgment normal.      No results found for any visits on 05/14/22.  Last metabolic panel Lab Results  Component Value Date   GLUCOSE 117 (H) 05/11/2022   NA 145 05/11/2022   K 4.4 05/11/2022   CL 99 05/11/2022   CO2 38 (H) 05/11/2022   BUN 21 05/11/2022   CREATININE 0.80 05/11/2022   GFRNONAA NOT CALCULATED 07/20/2018   CALCIUM 10.2 05/11/2022   PROT 7.1 05/11/2022   ALBUMIN 4.5 05/11/2022   BILITOT 0.4 05/11/2022   ALKPHOS 44 05/11/2022   AST 20 05/11/2022   ALT 8 05/11/2022   ANIONGAP 8 07/20/2018   Last lipids Lab Results  Component Value Date   CHOL 172 02/28/2021   HDL 76.10 02/28/2021   LDLCALC 76 02/28/2021   LDLDIRECT 155.6 07/11/2013   TRIG 98.0 02/28/2021   CHOLHDL 2 02/28/2021   Last hemoglobin A1c Lab Results  Component Value Date   HGBA1C 5.7 05/11/2022      The ASCVD Risk score (Arnett DK, et al., 2019) failed to calculate for the following reasons:   The 2019 ASCVD risk score is only valid for ages 43 to 86    Assessment & Plan:   Problem List Items Addressed This Visit       Cardiovascular and Mediastinum   HTN (hypertension)    Current hypertension medications:       Sig    losartan (COZAAR) 100 MG tablet Take 1 tablet (100 mg total) by mouth daily.    metoprolol succinate (TOPROL-XL) 25 MG 24 hr tablet Take 1 tablet (25 mg total) by mouth daily.    NIFEdipine (PROCARDIA-XL/NIFEDICAL-XL) 30 MG 24 hr tablet Take 1 tablet (30 mg  total) by mouth daily.     BP is well controlled today. Will continue the above listed medications as prescribed.I reviewed her CMP and it was stable from previous          Endocrine   Type II diabetes mellitus with complication (HCC)    D9M was reviewed with patient, well controlled at 5.7 however she is losing a bit of weight. We discussed using Boost shakes twice a day, consider stopping her metformin 500 mg daily  if her weight loss continues.         Other   Underweight due to inadequate caloric intake (Chronic)    Patient's weight is about 3 pounds less than previous weights, we discussed increasing fluids and using boost shakes at least twice a day in order to maintain body weight. We discussed medications that will help increase her appetite, however pt does not wish to start medication at this time.       Other Visit Diagnoses     Immunization due    -  Primary   Relevant Orders   Flu Vaccine QUAD High Dose(Fluad) (Completed)      I spent 45 minutes with patient and daughter discussing her appetite and her breathing. I believe that overall her breathing is at her baseline functioning, there is no acute exacerbation present today. Oxygen levels are stable. Recommended that she try increasing fluids and using Boost supplements twice a day in order to maintain her body weight.  Return in about 6 months (around 11/13/2022) for Follow up - can be a video visit if needed.    Farrel Conners, MD

## 2022-05-15 DIAGNOSIS — R636 Underweight: Secondary | ICD-10-CM | POA: Insufficient documentation

## 2022-05-15 NOTE — Assessment & Plan Note (Signed)
Current hypertension medications:      Sig   losartan (COZAAR) 100 MG tablet Take 1 tablet (100 mg total) by mouth daily.   metoprolol succinate (TOPROL-XL) 25 MG 24 hr tablet Take 1 tablet (25 mg total) by mouth daily.   NIFEdipine (PROCARDIA-XL/NIFEDICAL-XL) 30 MG 24 hr tablet Take 1 tablet (30 mg total) by mouth daily.    BP is well controlled today. Will continue the above listed medications as prescribed.I reviewed her CMP and it was stable from previous

## 2022-05-15 NOTE — Assessment & Plan Note (Signed)
Patient's weight is about 3 pounds less than previous weights, we discussed increasing fluids and using boost shakes at least twice a day in order to maintain body weight. We discussed medications that will help increase her appetite, however pt does not wish to start medication at this time.

## 2022-05-15 NOTE — Assessment & Plan Note (Signed)
A1C was reviewed with patient, well controlled at 5.7 however she is losing a bit of weight. We discussed using Boost shakes twice a day, consider stopping her metformin 500 mg daily if her weight loss continues.

## 2022-06-23 DIAGNOSIS — L603 Nail dystrophy: Secondary | ICD-10-CM | POA: Diagnosis not present

## 2022-06-23 DIAGNOSIS — M79671 Pain in right foot: Secondary | ICD-10-CM | POA: Diagnosis not present

## 2022-06-23 DIAGNOSIS — I739 Peripheral vascular disease, unspecified: Secondary | ICD-10-CM | POA: Diagnosis not present

## 2022-06-23 DIAGNOSIS — L84 Corns and callosities: Secondary | ICD-10-CM | POA: Diagnosis not present

## 2022-06-23 DIAGNOSIS — M79672 Pain in left foot: Secondary | ICD-10-CM | POA: Diagnosis not present

## 2022-06-26 ENCOUNTER — Other Ambulatory Visit: Payer: Self-pay | Admitting: Pulmonary Disease

## 2022-09-18 ENCOUNTER — Ambulatory Visit (INDEPENDENT_AMBULATORY_CARE_PROVIDER_SITE_OTHER): Payer: Medicare Other | Admitting: Pulmonary Disease

## 2022-09-18 ENCOUNTER — Encounter: Payer: Self-pay | Admitting: Pulmonary Disease

## 2022-09-18 VITALS — BP 140/70 | HR 109 | Ht 65.0 in | Wt 105.6 lb

## 2022-09-18 DIAGNOSIS — J9611 Chronic respiratory failure with hypoxia: Secondary | ICD-10-CM

## 2022-09-18 DIAGNOSIS — Z66 Do not resuscitate: Secondary | ICD-10-CM | POA: Diagnosis not present

## 2022-09-18 DIAGNOSIS — J439 Emphysema, unspecified: Secondary | ICD-10-CM | POA: Diagnosis not present

## 2022-09-18 DIAGNOSIS — J449 Chronic obstructive pulmonary disease, unspecified: Secondary | ICD-10-CM | POA: Diagnosis not present

## 2022-09-18 DIAGNOSIS — Z923 Personal history of irradiation: Secondary | ICD-10-CM

## 2022-09-18 MED ORDER — ALBUTEROL SULFATE HFA 108 (90 BASE) MCG/ACT IN AERS: 2.0000 | INHALATION_SPRAY | Freq: Four times a day (QID) | RESPIRATORY_TRACT | 5 refills | Status: DC | PRN

## 2022-09-18 MED ORDER — STIOLTO RESPIMAT 2.5-2.5 MCG/ACT IN AERS: INHALATION_SPRAY | RESPIRATORY_TRACT | 11 refills | Status: DC

## 2022-09-18 NOTE — Progress Notes (Signed)
Synopsis: Referred in Jan 2020 for very severe COPD by Farrel Conners, MD  Subjective:   PATIENT ID: Charlotte Harris GENDER: female DOB: 1932-03-26, MRN: [id number]  Chief Complaint  Patient presents with   Follow-up    F/up    PMH of very severe COPD, postbronchodilator FEV1 20% predicted, RUL T1N0M0 lung cancer, s/p radiation by Dr. Lisbeth Renshaw. Recently admitted to [facility] for COPD exacerbation.  She was subsequently seen in hospital follow-up by Wyn Quaker.  She is currently managed with Stiolto and twice daily nebulized Pulmicort.  Baseline functional status she is able to complete most of her activities of daily living on 2 L nasal cannula O2 and occasionally increases to 3 L.  She does have some shortness of breath at baseline.  It does take her some time to get ready in the morning.  She does have daily cough.  Overall since her hospitalization she has noticed a significant improvement.  When discussing her goals of care today in the office.  She informs me that she would never want to be placed on mechanical life support if possible.  I asked if anyone has ever discussed that with her.  She said she recently had discussion with her daughter about this.  She would like to continue to do all treatments to help her breathing and continued normal functional status but would not want aggressive measures.  She knows that she has severe lung disease.  OV 02/01/2019: During her last office visit we discussed CODE STATUS and overall goals of care.  At the time she stated she would not want cardiopulmonary resuscitation.  DNR status was added to her problem list.  Today she is here for follow-up regarding her COPD.  Currently maintained on Stiolto and Pulmicort.  Patient had pet imaging in January 2020.  Revealed low level uptake within the right upper lobe consistent with inflammatory changes. Today she is excited to share that her breathing is doing great! She is using her inhalers regularly and her HOT  3L pulse and 2L at rist. Denies fevers, chills, cough. Ordering groceries online and having them delivered.   OV 12/04/2020: Here today follow up.  From respiratory standpoint she is doing well.  Unfortunately she has been losing weight.  Her BMI is down to 17.  Here today for evaluation to walk with a POC.  She is stable on 3 L pulsed at rest and 4 L pulsed with exertion.  She should continue this.  Was walked today.  OV 09/04/2021: Here today for follow-up for management of COPD.  She has been doing really well for the past year.  Her BMI is still 17.  She has gained at least 1 pound.  She has been focusing on her nutritional intake.  She is trying to take in as many calories as she can.  Currently well managed with the Stiolto plus Pulmicort.  She uses her nebulizer at least twice a day as well.  Rarely using her albuterol.  She is able to complete most of her activities activities of daily living however it does take her an increasing amount of time to do them.  She has separate locations in the home where she has to sit and rest but she is at least able to get around.  Her daughter is with her today in the office.  We discussed progression of COPD with advanced lung disease and what to expect over time.  OV 09/18/2022: Here today for 1 year follow-up.  Manage for severe COPD. No abx or steroids in the past year.  She is doing really well from respiratory standpoint.  She is been very stable.  Uses her oxygen daily pulsed therapy.  Able to get around complete most of her activities of daily living.  Up-to-date on vaccines.  Using her inhaler regimen regularly.    Past Medical History:  Diagnosis Date   ADENOCARCINOMA, LUNG 07/03/2010   RUL   ASYMPTOMATIC POSTMENOPAUSAL STATUS 07/02/2009   Breast cancer ([place]) 1991   R mastectomy, no chemo or radiation   COLONIC POLYPS, HX OF 04/16/2007   COPD 05/17/2008   HEMATURIA, HX OF 04/16/2007   History of radiation therapy 07/30/09 to 08/09/09   RUL lung    HYPERLIPIDEMIA 04/16/2007   Hypertension    HYPERTENSION 04/16/2007   HYPOTHYROIDISM, POST-RADIATION 06/07/2008   Osteoarthritis    OSTEOPOROSIS 07/02/2009   Wrist fracture, left      Family History  Problem Relation Age of Onset   Heart attack Mother 100   Heart disease Mother    Cancer Sister        "throat" cancer   Heart disease Father    Cancer Brother        blood   Heart disease Sister    Cancer Brother        blood   Heart disease Other        CAD   Hypertension Other    Colon cancer Neg Hx    Stomach cancer Neg Hx      Past Surgical History:  Procedure Laterality Date   BREAST SURGERY     lumpectomy   EYE SURGERY Bilateral    cataracts   LUNG BIOPSY  08/31/11   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   LUNG BIOPSY  05/22/2009   RUL lung=Adenocarcinoma   MASTECTOMY  1991   right   TONSILLECTOMY  1955    Social History   Socioeconomic History   Marital status: Widowed    Spouse name: Not on file   Number of children: Not on file   Years of education: Not on file   Highest education level: 12th grade  Occupational History   Occupation: Retired (worked Lexicographer)    Employer: RETIRED  Tobacco Use   Smoking status: Former    Packs/day: 1.00    Years: 50.00    Total pack years: 50.00    Types: Cigarettes    Quit date: 08/03/2009    Years since quitting: 13.1   Smokeless tobacco: Never  Vaping Use   Vaping Use: Never used  Substance and Sexual Activity   Alcohol use: Yes    Alcohol/week: 2.0 standard drinks of alcohol    Types: 2 Glasses of wine per week   Drug use: No   Sexual activity: Not on file  Other Topics Concern   Not on file  Social History Narrative   Widowed 2005   Quit smoking 2010   No other exposures, no TB hx   Social Determinants of Health   Financial Resource Strain: Low Risk  (05/10/2022)   Overall Financial Resource Strain (CARDIA)    Difficulty of Paying Living Expenses: Not hard at all  Food Insecurity: No Food  Insecurity (05/10/2022)   Hunger Vital Sign    Worried About Running Out of Food in the Last Year: Never true    Ran Out of Food in the Last Year: Never true  Transportation Needs: No Transportation Needs (05/10/2022)   PRAPARE -  Hydrologist (Medical): No    Lack of Transportation (Non-Medical): No  Physical Activity: Inactive (05/10/2022)   Exercise Vital Sign    Days of Exercise per Week: 0 days    Minutes of Exercise per Session: 0 min  Stress: No Stress Concern Present (05/10/2022)   [place]    Feeling of Stress : Not at all  Social Connections: Unknown (05/10/2022)   Social Connection and Isolation Panel [NHANES]    Frequency of Communication with Friends and Family: Twice a week    Frequency of Social Gatherings with Friends and Family: Patient refused    Attends Religious Services: Patient refused    Marine scientist or Organizations: No    Attends Music therapist: Not on file    Marital Status: Widowed  Intimate Partner Violence: Not At Risk (03/25/2021)   Humiliation, Afraid, Rape, and Kick questionnaire    Fear of Current or Ex-Partner: No    Emotionally Abused: No    Physically Abused: No    Sexually Abused: No     Allergies  Allergen Reactions   Crestor [Rosuvastatin Calcium]     cramping     Outpatient Medications Prior to Visit  Medication Sig Dispense Refill   acetaminophen (TYLENOL) 500 MG tablet Take 500 mg by mouth every 6 (six) hours as needed for headache (pain).      aspirin EC 81 MG tablet Take 81 mg by mouth at bedtime.     budesonide (PULMICORT) 0.25 MG/2ML nebulizer solution USE 2ML (0.25 MG TOTAL) BY NEBULIZATION TWICE A DAY. 180 mL 3   Calcium Carb-Cholecalciferol (CALCIUM 1000 + D PO) Take 2 tablets by mouth daily.     levothyroxine (SYNTHROID) 50 MCG tablet TAKE 1 TABLET BY MOUTH EVERY DAY BEFORE BREAKFAST 90 tablet 3   losartan  (COZAAR) 100 MG tablet Take 1 tablet (100 mg total) by mouth daily. 90 tablet 1   metFORMIN (GLUCOPHAGE-XR) 500 MG 24 hr tablet Take 1 tablet (500 mg total) by mouth daily. 90 tablet 3   metoprolol succinate (TOPROL-XL) 25 MG 24 hr tablet Take 1 tablet (25 mg total) by mouth daily. 90 tablet 1   NIFEdipine (PROCARDIA-XL/NIFEDICAL-XL) 30 MG 24 hr tablet Take 1 tablet (30 mg total) by mouth daily. 90 tablet 1   pravastatin (PRAVACHOL) 20 MG tablet Take 1 tablet (20 mg total) by mouth daily. 90 tablet 1   Respiratory Therapy Supplies (NEBULIZER COMPRESSOR) KIT PLEASE DISPENSE 1 NEBULIZER MACHINE AND SUPPLIES DX: J44.9 1 each 0   vitamin B-12 (CYANOCOBALAMIN) 100 MCG tablet Take 100 mcg by mouth daily.     albuterol (VENTOLIN HFA) 108 (90 Base) MCG/ACT inhaler Inhale 2 puffs into the lungs every 6 (six) hours as needed for wheezing or shortness of breath. 8 g 2   Tiotropium Bromide-Olodaterol (STIOLTO RESPIMAT) 2.5-2.5 MCG/ACT AERS INHALE 2 PUFFS BY MOUTH INTO THE LUNGS DAILY 4 g 3   Albuterol Sulfate (PROAIR RESPICLICK) 440 (90 Base) MCG/ACT AEPB Inhale 1 puff into the lungs 4 (four) times daily as needed (shortness of breath/ wheezing). (Patient not taking: Reported on 09/18/2022) 2 each 3   No facility-administered medications prior to visit.    Review of Systems  Constitutional:  Negative for chills, fever, malaise/fatigue and weight loss.  HENT:  Negative for hearing loss, sore throat and tinnitus.   Eyes:  Negative for blurred vision and double vision.  Respiratory:  Positive for  shortness of breath. Negative for cough, hemoptysis, sputum production, wheezing and stridor.   Cardiovascular:  Negative for chest pain, palpitations, orthopnea, leg swelling and PND.  Gastrointestinal:  Negative for abdominal pain, constipation, diarrhea, heartburn, nausea and vomiting.  Genitourinary:  Negative for dysuria, hematuria and urgency.  Musculoskeletal:  Negative for joint pain and myalgias.  Skin:   Negative for itching and rash.  Neurological:  Negative for dizziness, tingling, weakness and headaches.  Endo/Heme/Allergies:  Negative for environmental allergies. Does not bruise/bleed easily.  Psychiatric/Behavioral:  Negative for depression. The patient is not nervous/anxious and does not have insomnia.   All other systems reviewed and are negative.    Objective:  Physical Exam Vitals reviewed.  Constitutional:      General: She is not in acute distress.    Appearance: She is well-developed.  HENT:     Head: Normocephalic and atraumatic.     Mouth/Throat:     Pharynx: No oropharyngeal exudate.  Eyes:     Conjunctiva/sclera: Conjunctivae normal.     Pupils: Pupils are equal, round, and reactive to light.  Neck:     Vascular: No JVD.     Trachea: No tracheal deviation.     Comments: Loss of supraclavicular fat Cardiovascular:     Rate and Rhythm: Normal rate and regular rhythm.     Heart sounds: S1 normal and S2 normal.     Comments: Distant heart tones Pulmonary:     Effort: No tachypnea or accessory muscle usage.     Breath sounds: No stridor. Decreased breath sounds (throughout all lung fields) present. No wheezing, rhonchi or rales.     Comments: Severely diminished bilaterally Abdominal:     General: There is no distension.     Palpations: Abdomen is soft.     Tenderness: There is no abdominal tenderness.  Musculoskeletal:        General: Deformity (muscle wasting ) present.  Skin:    General: Skin is warm and dry.     Capillary Refill: Capillary refill takes less than 2 seconds.     Findings: No rash.  Neurological:     Mental Status: She is alert and oriented to person, place, and time.  Psychiatric:        Behavior: Behavior normal.      Vitals:   09/18/22 1304  BP: (!) 160/80  Pulse: (!) 109  SpO2: 94%  Weight: 105 lb 9.6 oz (47.9 kg)  Height: 5\' 5"  (1.651 m)   94% on 4 pulse BMI Readings from Last 3 Encounters:  09/18/22 17.57 kg/m  05/14/22  16.66 kg/m  04/16/22 16.97 kg/m   Wt Readings from Last 3 Encounters:  09/18/22 105 lb 9.6 oz (47.9 kg)  05/14/22 100 lb 1.6 oz (45.4 kg)  04/16/22 102 lb (46.3 kg)    CBC    Component Value Date/Time   WBC 6.9 05/11/2022 1303   RBC 2.99 (L) 05/11/2022 1303   HGB 9.5 (L) 05/11/2022 1303   HCT 29.1 (L) 05/11/2022 1303   PLT 187.0 05/11/2022 1303   MCV 97.2 05/11/2022 1303   MCH 30.7 04/12/2020 1044   MCHC 32.7 05/11/2022 1303   RDW 14.6 05/11/2022 1303   LYMPHSABS 1.4 05/11/2022 1303   MONOABS 0.8 05/11/2022 1303   EOSABS 0.1 05/11/2022 1303   BASOSABS 0.0 05/11/2022 1303    Chest Imaging:  07/18/2018: CT scan of the chest IMPRESSION: 1. No evidence acute pulmonary embolism. 2. No acute finding of the aorta or great vessels.  3. Interval increase in size of RIGHT consolidative mass adjacent to a pathologic rib fracture. Favor progressive inflammatory process versus indolent carcinoma. 4. Adjacent angular RIGHT upper lobe nodule also slightly increased in thickness from 09/02/2015. Favored benign inflammatory thickening.  08/08/2018 pet imaging. Low-level metabolic uptake within the right upper lobe consistent with benign inflammatory process. The patient's images have been independently reviewed by me.    Pulmonary Functions Testing Results:    Latest Ref Rng & Units 07/19/2018    7:33 PM  PFT Results  FVC-Pre L 0.89   FVC-Predicted Pre % 33   Pre FEV1/FVC % % 45   FEV1-Pre L 0.40   FEV1-Predicted Pre % 20        Assessment & Plan:      ICD-10-CM   1. Stage 4 very severe COPD by GOLD classification ([place])  J44.9     2. Pulmonary emphysema, unspecified emphysema type ([place])  J43.9     3. History of radiation therapy  Z92.3     4. Chronic hypoxemic respiratory failure (HCC)  J96.11     5. DNR (do not resuscitate)  Z66        Discussion:  This is an 89 year old female, very severe COPD, Gold stage IV FEV1 20% predicted, BMI of 17.  Her weight has  been stable over the past year she has had no hospitalizations and no exacerbations.  Currently maintained well on her inhaler regimen.  Plan: Continue Pulmicort plus Stiolto Continue albuterol as needed Encouraged p.o. intake, maintenance of her weight. Continue to move around and stay as active as she can and.     Current Outpatient Medications:    acetaminophen (TYLENOL) 500 MG tablet, Take 500 mg by mouth every 6 (six) hours as needed for headache (pain). , Disp: , Rfl:    aspirin EC 81 MG tablet, Take 81 mg by mouth at bedtime., Disp: , Rfl:    budesonide (PULMICORT) 0.25 MG/2ML nebulizer solution, USE 2ML (0.25 MG TOTAL) BY NEBULIZATION TWICE A DAY., Disp: 180 mL, Rfl: 3   Calcium Carb-Cholecalciferol (CALCIUM 1000 + D PO), Take 2 tablets by mouth daily., Disp: , Rfl:    levothyroxine (SYNTHROID) 50 MCG tablet, TAKE 1 TABLET BY MOUTH EVERY DAY BEFORE BREAKFAST, Disp: 90 tablet, Rfl: 3   losartan (COZAAR) 100 MG tablet, Take 1 tablet (100 mg total) by mouth daily., Disp: 90 tablet, Rfl: 1   metFORMIN (GLUCOPHAGE-XR) 500 MG 24 hr tablet, Take 1 tablet (500 mg total) by mouth daily., Disp: 90 tablet, Rfl: 3   metoprolol succinate (TOPROL-XL) 25 MG 24 hr tablet, Take 1 tablet (25 mg total) by mouth daily., Disp: 90 tablet, Rfl: 1   NIFEdipine (PROCARDIA-XL/NIFEDICAL-XL) 30 MG 24 hr tablet, Take 1 tablet (30 mg total) by mouth daily., Disp: 90 tablet, Rfl: 1   pravastatin (PRAVACHOL) 20 MG tablet, Take 1 tablet (20 mg total) by mouth daily., Disp: 90 tablet, Rfl: 1   Respiratory Therapy Supplies (NEBULIZER COMPRESSOR) KIT, PLEASE DISPENSE 1 NEBULIZER MACHINE AND SUPPLIES DX: J44.9, Disp: 1 each, Rfl: 0   vitamin B-12 (CYANOCOBALAMIN) 100 MCG tablet, Take 100 mcg by mouth daily., Disp: , Rfl:    albuterol (VENTOLIN HFA) 108 (90 Base) MCG/ACT inhaler, Inhale 2 puffs into the lungs every 6 (six) hours as needed for wheezing or shortness of breath., Disp: 8 g, Rfl: 5   Albuterol Sulfate (PROAIR  RESPICLICK) 517 (90 Base) MCG/ACT AEPB, Inhale 1 puff into the lungs 4 (four) times daily as  needed (shortness of breath/ wheezing). (Patient not taking: Reported on 09/18/2022), Disp: 2 each, Rfl: 3   STIOLTO RESPIMAT 2.5-2.5 MCG/ACT AERS, 2 puffs once daily, Disp: 4 g, Rfl: [place], DO [facility] 09/18/2022 1:24 PM

## 2022-09-18 NOTE — Patient Instructions (Signed)
Thank you for visiting Dr. Valeta Harms at [facility]. Today we recommend the following:   Meds ordered this encounter  Medications   albuterol (VENTOLIN HFA) 108 (90 Base) MCG/ACT inhaler    Sig: Inhale 2 puffs into the lungs every 6 (six) hours as needed for wheezing or shortness of breath.    Dispense:  8 g    Refill:  5   STIOLTO RESPIMAT 2.5-2.5 MCG/ACT AERS    Sig: 2 puffs once daily    Dispense:  4 g    Refill:  11    Order Specific Question:   Lot Number?    Answer:   [id number]    Order Specific Question:   Expiration Date?    Answer:   09/03/2019   Return in about 1 year (around 09/19/2023) for with APP or Dr. Valeta Harms.    Please do your part to reduce the spread of COVID-19.

## 2022-09-25 DIAGNOSIS — I739 Peripheral vascular disease, unspecified: Secondary | ICD-10-CM | POA: Diagnosis not present

## 2022-09-25 DIAGNOSIS — L84 Corns and callosities: Secondary | ICD-10-CM | POA: Diagnosis not present

## 2022-09-25 DIAGNOSIS — E1151 Type 2 diabetes mellitus with diabetic peripheral angiopathy without gangrene: Secondary | ICD-10-CM | POA: Diagnosis not present

## 2022-09-25 DIAGNOSIS — E1142 Type 2 diabetes mellitus with diabetic polyneuropathy: Secondary | ICD-10-CM | POA: Diagnosis not present

## 2022-09-25 DIAGNOSIS — L603 Nail dystrophy: Secondary | ICD-10-CM | POA: Diagnosis not present

## 2022-10-24 ENCOUNTER — Other Ambulatory Visit: Payer: Self-pay | Admitting: Family Medicine

## 2022-10-24 DIAGNOSIS — E782 Mixed hyperlipidemia: Secondary | ICD-10-CM

## 2022-10-24 DIAGNOSIS — I10 Essential (primary) hypertension: Secondary | ICD-10-CM

## 2022-11-10 ENCOUNTER — Other Ambulatory Visit: Payer: Self-pay | Admitting: Family Medicine

## 2022-11-10 DIAGNOSIS — I10 Essential (primary) hypertension: Secondary | ICD-10-CM

## 2022-12-14 ENCOUNTER — Telehealth: Payer: Self-pay | Admitting: Pulmonary Disease

## 2022-12-14 DIAGNOSIS — E1142 Type 2 diabetes mellitus with diabetic polyneuropathy: Secondary | ICD-10-CM | POA: Diagnosis not present

## 2022-12-14 DIAGNOSIS — I739 Peripheral vascular disease, unspecified: Secondary | ICD-10-CM | POA: Diagnosis not present

## 2022-12-14 DIAGNOSIS — L603 Nail dystrophy: Secondary | ICD-10-CM | POA: Diagnosis not present

## 2022-12-14 DIAGNOSIS — E1151 Type 2 diabetes mellitus with diabetic peripheral angiopathy without gangrene: Secondary | ICD-10-CM | POA: Diagnosis not present

## 2022-12-14 DIAGNOSIS — L84 Corns and callosities: Secondary | ICD-10-CM | POA: Diagnosis not present

## 2022-12-14 NOTE — Telephone Encounter (Signed)
PT daughter calling and would like to know what is needed from Dr.  on a Chronic Illness/Disability Insurance that the PT has.   The PT is getting worse  Letter she got states she has to be "accessed and certified" for the policy to pay out.  .  [employer] "Long Term Care Ins Policy" is what it is called. She thinks it means a Dr. Laury Axon to sign off on it.  Please call her @ [phone] Her name Celie. Very nice lady. She will try to down load letter on [facility]

## 2022-12-15 NOTE — Telephone Encounter (Signed)
Sent daughter message on my chart. Waiting for response

## 2022-12-16 ENCOUNTER — Telehealth: Payer: Self-pay | Admitting: Pulmonary Disease

## 2022-12-16 NOTE — Telephone Encounter (Signed)
Received disability paperwork. Placed in Charlotte Harris's box.

## 2022-12-16 NOTE — Telephone Encounter (Signed)
Please try again to reach daughter by phone and close encounter if she does not answer. TY

## 2022-12-21 ENCOUNTER — Telehealth: Payer: Self-pay

## 2022-12-21 NOTE — Patient Outreach (Signed)
  Care Coordination   12/21/2022 Name: Charlotte Harris MRN: [id number] DOB: 04-19-1932   Care Coordination Outreach Attempts:  An unsuccessful telephone outreach was attempted today to offer the patient information about available care coordination services.  Follow Up Plan:  Additional outreach attempts will be made to offer the patient care coordination information and services.   Encounter Outcome:  No Answer   Care Coordination Interventions:  No, not indicated    Bevelyn Ngo, BSW, CDP Social Worker, Certified Dementia Practitioner [facility] Care Management  Care Coordination [phone]

## 2022-12-30 ENCOUNTER — Telehealth: Payer: Self-pay

## 2022-12-30 NOTE — Patient Outreach (Signed)
  Care Coordination   12/30/2022 Name: Charlotte Harris MRN: [id number] DOB: 1932/05/18   Care Coordination Outreach Attempts:  A second unsuccessful outreach was attempted today to offer the patient with information about available care coordination services.  Follow Up Plan:  Additional outreach attempts will be made to offer the patient care coordination information and services.   Encounter Outcome:  No Answer   Care Coordination Interventions:  No, not indicated    Bevelyn Ngo, BSW, CDP Social Worker, Certified Dementia Practitioner [facility] Care Management  Care Coordination [phone]

## 2022-12-31 ENCOUNTER — Telehealth: Payer: Self-pay | Admitting: Pulmonary Disease

## 2022-12-31 ENCOUNTER — Ambulatory Visit: Payer: Self-pay

## 2022-12-31 NOTE — Telephone Encounter (Signed)
Dr. Tonia Brooms received a request from [facility] to complete a Comprehensive Health Assessment on this patient to qualify her for long term disability.  The form covers the patient's mental, behavioral, and overall health and ability to perform ADL's and Dr. Tonia Brooms said it needs to be completed by the PCP.  I have contacted [employer] to tell them Dr. Tonia Brooms will not complete it.  I have also called the patient's daughter, Saraelizabeth Bollenbach and then emailed her a copy of the form.   The patient had not been given a copy and had no idea how extensive it is.

## 2022-12-31 NOTE — Patient Outreach (Signed)
  Care Coordination   Initial Visit Note   12/31/2022 Name: Charlotte Harris MRN: [id number] DOB: [birth year]  Charlotte Harris is a 89 y.o. year old female who sees Karie Georges, MD for primary care. I spoke with  Shea Evans by phone today.  What matters to the patients health and wellness today?  No concerns, patient is doing well at this time    Goals Addressed             This Visit's Progress    COMPLETED: Care Coordination Activities       Care Coordination Interventions: SDoH screening performed - no acute resource challenges noted at this time Determined the patient does not have concerns with medication costs and/or adherence at this time Education provided on the role of the care coordination team - no follow up desired at this time Encouraged the patient to contact her primary care provider as needed         SDOH assessments and interventions completed:  Yes  SDOH Interventions Today    Flowsheet Row Most Recent Value  SDOH Interventions   Food Insecurity Interventions Intervention Not Indicated  Housing Interventions Intervention Not Indicated  Transportation Interventions Intervention Not Indicated        Care Coordination Interventions:  Yes, provided   Interventions Today    Flowsheet Row Most Recent Value  Chronic Disease   Chronic disease during today's visit Hypertension (HTN), Chronic Obstructive Pulmonary Disease (COPD), Diabetes  General Interventions   General Interventions Discussed/Reviewed General Interventions Discussed, Doctor Visits  Doctor Visits Discussed/Reviewed Doctor Visits Reviewed  Education Interventions   Education Provided Provided Education  [role of the Care Coordination team]        Follow up plan: No further intervention required.   Encounter Outcome:  Pt. Visit Completed   Bevelyn Ngo, BSW, CDP Social Worker, Certified Dementia Practitioner [facility] Care Management  Care  Coordination [phone]

## 2022-12-31 NOTE — Patient Instructions (Signed)
Visit Information  Thank you for taking time to visit with me today. Please don't hesitate to contact me if I can be of assistance to you.   Following are the goals we discussed today:  -Contact your primary care provider as needed   If you are experiencing a Mental Health or Behavioral Health Crisis or need someone to talk to, please go to [facility] [address], [place] ([phone]) call 911  Patient verbalizes understanding of instructions and care plan provided today and agrees to view in MyChart. Active MyChart status and patient understanding of how to access instructions and care plan via MyChart confirmed with patient.     No further follow up required: Please contact the care coordination team as needed  Raj Landress, BSW, CDP Social Worker, Certified Dementia Practitioner [facility] Care Management  Care Coordination [phone]          

## 2023-02-18 ENCOUNTER — Telehealth: Payer: Self-pay | Admitting: Pulmonary Disease

## 2023-02-18 NOTE — Telephone Encounter (Signed)
[employer]. They fax'd over a req that needed Dr. Lenox Ponds. Please return to them  Fax is [phone]

## 2023-03-01 ENCOUNTER — Other Ambulatory Visit: Payer: Self-pay | Admitting: Family Medicine

## 2023-03-01 DIAGNOSIS — I10 Essential (primary) hypertension: Secondary | ICD-10-CM

## 2023-03-04 ENCOUNTER — Telehealth: Payer: Self-pay | Admitting: Pulmonary Disease

## 2023-03-04 NOTE — Telephone Encounter (Signed)
[employer]. They fax'd over a req that needed Dr. Lenox Ponds. Please return to them   Fax is [phone]  It was a questionnaire the Dr. Had to sign.

## 2023-03-10 ENCOUNTER — Other Ambulatory Visit: Payer: Self-pay | Admitting: Family Medicine

## 2023-03-10 DIAGNOSIS — I10 Essential (primary) hypertension: Secondary | ICD-10-CM

## 2023-03-15 ENCOUNTER — Other Ambulatory Visit: Payer: Self-pay | Admitting: Family Medicine

## 2023-03-15 DIAGNOSIS — E782 Mixed hyperlipidemia: Secondary | ICD-10-CM

## 2023-04-11 ENCOUNTER — Other Ambulatory Visit: Payer: Self-pay | Admitting: Family Medicine

## 2023-04-11 DIAGNOSIS — E118 Type 2 diabetes mellitus with unspecified complications: Secondary | ICD-10-CM

## 2023-04-12 NOTE — Telephone Encounter (Addendum)
I called [employer] about the ADL/Cognitive Impairment Questionnaire and let the rep know that Dr. Tonia Brooms is the pulmonologist and is not the provider who can fill out this form.  I also gave the rep the name of the patient's PCP - Dr. Nira Conn.  I also faxed a copy of the form back to [employer] with a note that Dr. Tonia Brooms will not fill it out and it needs to go to the PCP.   Fax# [phone]

## 2023-05-05 ENCOUNTER — Other Ambulatory Visit: Payer: Self-pay | Admitting: Family Medicine

## 2023-05-05 DIAGNOSIS — E118 Type 2 diabetes mellitus with unspecified complications: Secondary | ICD-10-CM

## 2023-05-08 ENCOUNTER — Other Ambulatory Visit: Payer: Self-pay | Admitting: Family Medicine

## 2023-05-08 DIAGNOSIS — I10 Essential (primary) hypertension: Secondary | ICD-10-CM

## 2023-05-16 ENCOUNTER — Other Ambulatory Visit: Payer: Self-pay | Admitting: Family Medicine

## 2023-05-16 DIAGNOSIS — E890 Postprocedural hypothyroidism: Secondary | ICD-10-CM

## 2023-05-16 DIAGNOSIS — I10 Essential (primary) hypertension: Secondary | ICD-10-CM

## 2023-05-21 ENCOUNTER — Encounter: Payer: Self-pay | Admitting: Family Medicine

## 2023-05-21 NOTE — Telephone Encounter (Signed)
Ok to schedule patient for a video visit.

## 2023-05-28 ENCOUNTER — Telehealth (INDEPENDENT_AMBULATORY_CARE_PROVIDER_SITE_OTHER): Payer: Medicare Other | Admitting: Family Medicine

## 2023-05-28 ENCOUNTER — Encounter: Payer: Self-pay | Admitting: Family Medicine

## 2023-05-28 VITALS — BP 114/73 | HR 91 | Wt 104.0 lb

## 2023-05-28 DIAGNOSIS — E118 Type 2 diabetes mellitus with unspecified complications: Secondary | ICD-10-CM | POA: Diagnosis not present

## 2023-05-28 DIAGNOSIS — Z7984 Long term (current) use of oral hypoglycemic drugs: Secondary | ICD-10-CM

## 2023-05-28 DIAGNOSIS — I10 Essential (primary) hypertension: Secondary | ICD-10-CM | POA: Diagnosis not present

## 2023-05-28 DIAGNOSIS — E890 Postprocedural hypothyroidism: Secondary | ICD-10-CM | POA: Diagnosis not present

## 2023-05-28 DIAGNOSIS — D509 Iron deficiency anemia, unspecified: Secondary | ICD-10-CM | POA: Insufficient documentation

## 2023-05-28 DIAGNOSIS — E782 Mixed hyperlipidemia: Secondary | ICD-10-CM

## 2023-05-28 MED ORDER — PRAVASTATIN SODIUM 20 MG PO TABS: 20.0000 mg | ORAL_TABLET | Freq: Every day | ORAL | 3 refills | Status: DC

## 2023-05-28 MED ORDER — METFORMIN HCL ER 500 MG PO TB24
500.0000 mg | ORAL_TABLET | Freq: Every day | ORAL | 1 refills | Status: DC
Start: 2023-05-28 — End: 2023-09-30

## 2023-05-28 MED ORDER — NIFEDIPINE ER 30 MG PO TB24: 30.0000 mg | ORAL_TABLET | Freq: Every day | ORAL | 3 refills | Status: DC

## 2023-05-28 MED ORDER — METOPROLOL SUCCINATE ER 25 MG PO TB24
25.0000 mg | ORAL_TABLET | Freq: Every day | ORAL | 1 refills | Status: DC
Start: 2023-05-28 — End: 2023-10-03

## 2023-05-28 MED ORDER — LOSARTAN POTASSIUM 100 MG PO TABS: 100.0000 mg | ORAL_TABLET | Freq: Every day | ORAL | 3 refills | Status: DC

## 2023-05-28 MED ORDER — LEVOTHYROXINE SODIUM 50 MCG PO TABS: ORAL_TABLET | ORAL | 3 refills | Status: DC

## 2023-05-28 NOTE — Assessment & Plan Note (Signed)
Chronic anemia, she needs updated labs for surveillance of her Hb. CBC ordered. She reports progressively worsening dyspnea, thinks it is mostly from her COPD gradually worsening. We discussed how being anemic can contribute to this problem.

## 2023-05-28 NOTE — Assessment & Plan Note (Signed)
On 50 mcg levothyroxine, last TSH was WNL, will order new TSH, continue current meds

## 2023-05-28 NOTE — Assessment & Plan Note (Signed)
Chronic, stable, well controlled on metformin 500 mg XR daily. Will continue this medication as prescribed.

## 2023-05-28 NOTE — Progress Notes (Signed)
Virtual Medical Office Visit  Patient:  Charlotte Harris      Age: 89 y.o.       Sex:  female  Date:   05/28/2023  PCP:    Karie Georges, MD   Today's Healthcare Provider: Karie Georges, MD    Assessment/Plan:   Summary assessment:  Suong was seen today for medical management of chronic issues.  Mixed hyperlipidemia Overview: Qualifier: Diagnosis of  By: Tyrone Apple, Lucy    Orders: -     Lipid panel; Future -     Pravastatin Sodium; Take 1 tablet (20 mg total) by mouth daily.  Dispense: 90 tablet; Refill: 3  Type II diabetes mellitus with complication (HCC) Assessment & Plan: Chronic, stable, well controlled on metformin 500 mg XR daily. Will continue this medication as prescribed.   Orders: -     metFORMIN HCl ER; Take 1 tablet (500 mg total) by mouth daily.  Dispense: 90 tablet; Refill: 1 -     Comprehensive metabolic panel; Future -     Hemoglobin A1c; Future  Primary hypertension Overview: Qualifier: Diagnosis of  By: Tyrone Apple, Lucy    Assessment & Plan: Current hypertension medications:       Sig    losartan (COZAAR) 100 MG tablet Take 1 tablet (100 mg total) by mouth daily.    metoprolol succinate (TOPROL-XL) 25 MG 24 hr tablet Take 1 tablet (25 mg total) by mouth daily.    NIFEdipine (PROCARDIA-XL/NIFEDICAL-XL) 30 MG 24 hr tablet Take 1 tablet (30 mg total) by mouth daily.     BP is well controlled today. Will continue the above listed medications as prescribed. I have ordered her annual labs to be done. She reports her daughter is coming in for thanksgiving so she could get them done then.   Orders: -     Metoprolol Succinate ER; Take 1 tablet (25 mg total) by mouth daily.  Dispense: 90 tablet; Refill: 1 -     Losartan Potassium; Take 1 tablet (100 mg total) by mouth daily.  Dispense: 90 tablet; Refill: 3 -     NIFEdipine ER; Take 1 tablet (30 mg total) by mouth daily.  Dispense: 90 tablet; Refill: 3  Iron deficiency anemia, unspecified  iron deficiency anemia type Assessment & Plan: Chronic anemia, she needs updated labs for surveillance of her Hb. CBC ordered. She reports progressively worsening dyspnea, thinks it is mostly from her COPD gradually worsening. We discussed how being anemic can contribute to this problem.   Orders: -     CBC with Differential/Platelet; Future  Hypothyroidism following radioiodine therapy Overview: Qualifier: Diagnosis of  By: Everardo All MD, Sean A   Assessment & Plan: On 50 mcg levothyroxine, last TSH was WNL, will order new TSH, continue current meds  Orders: -     Levothyroxine Sodium; TAKE 1 TABLET BY MOUTH EVERY DAY BEFORE BREAKFAST  Dispense: 90 tablet; Refill: 3 -     TSH; Future     No follow-ups on file.   She was advised to call the office or go to ER if her condition worsens    Subjective:   Charlotte Harris is a 89 y.o. female with PMH significant for: Past Medical History:  Diagnosis Date   ADENOCARCINOMA, LUNG 07/03/2010   RUL   ASYMPTOMATIC POSTMENOPAUSAL STATUS 07/02/2009   Breast cancer (HCC) 1991   R mastectomy, no chemo or radiation   COLONIC POLYPS, HX OF 04/16/2007   COPD 05/17/2008  HEMATURIA, HX OF 04/16/2007   History of radiation therapy 07/30/09 to 08/09/09   RUL lung   HYPERLIPIDEMIA 04/16/2007   Hypertension    HYPERTENSION 04/16/2007   HYPOTHYROIDISM, POST-RADIATION 06/07/2008   Osteoarthritis    OSTEOPOROSIS 07/02/2009   Wrist fracture, left      Presenting today with: Chief Complaint  Patient presents with   Medical Management of Chronic Issues     She clarifies and reports that her condition: Pt is here for follow up. Patient is essentially home bound and we are following up on her medications. Pt reports that her COPD seems to be getting somewhat worse. States she has great difficulty with exertion, can walk from the kitchen to the BR -- even this in her small apartment causes extreme dyspnea. States that she does not have any symptoms  at rest.  HTN-- pt is checking her blood pressure regularly at home. Today her BP is 114/73, she denies any side effects to the medications. Needs refills DM-- pt checks her blood sugars regularly also, her last reading before lunch was 97.   She denies having any: Side effects to the medications, no changes in her overall health in the last year.          Objective/Observations  Physical Exam:  Polite and friendly Gen: NAD, resting comfortably Pulm: Normal work of breathing Neuro: Grossly normal, moves all extremities Psych: Normal affect and thought content Problem specific physical exam findings:    No images are attached to the encounter or orders placed in the encounter.    Results: No results found for any visits on 05/28/23.   No results found for this or any previous visit (from the past 2160 hour(s)).         Virtual Visit via Video   I connected with Shea Evans on 05/28/23 at  2:00 PM EDT by a video enabled telemedicine application and verified that I am speaking with the correct person using two identifiers. The limitations of evaluation and management by telemedicine and the availability of in person appointments were discussed. The patient expressed understanding and agreed to proceed.   Percentage of appointment time on video:  100% Patient location: Home Provider location: [facility] Persons participating in the virtual visit: Myself and Patient

## 2023-05-28 NOTE — Assessment & Plan Note (Signed)
Current hypertension medications:       Sig    losartan (COZAAR) 100 MG tablet Take 1 tablet (100 mg total) by mouth daily.    metoprolol succinate (TOPROL-XL) 25 MG 24 hr tablet Take 1 tablet (25 mg total) by mouth daily.    NIFEdipine (PROCARDIA-XL/NIFEDICAL-XL) 30 MG 24 hr tablet Take 1 tablet (30 mg total) by mouth daily.     BP is well controlled today. Will continue the above listed medications as prescribed. I have ordered her annual labs to be done. She reports her daughter is coming in for thanksgiving so she could get them done then.

## 2023-05-31 DIAGNOSIS — L84 Corns and callosities: Secondary | ICD-10-CM | POA: Diagnosis not present

## 2023-05-31 DIAGNOSIS — I739 Peripheral vascular disease, unspecified: Secondary | ICD-10-CM | POA: Diagnosis not present

## 2023-05-31 DIAGNOSIS — L603 Nail dystrophy: Secondary | ICD-10-CM | POA: Diagnosis not present

## 2023-05-31 DIAGNOSIS — E1151 Type 2 diabetes mellitus with diabetic peripheral angiopathy without gangrene: Secondary | ICD-10-CM | POA: Diagnosis not present

## 2023-05-31 DIAGNOSIS — E1142 Type 2 diabetes mellitus with diabetic polyneuropathy: Secondary | ICD-10-CM | POA: Diagnosis not present

## 2023-06-30 ENCOUNTER — Other Ambulatory Visit (INDEPENDENT_AMBULATORY_CARE_PROVIDER_SITE_OTHER): Payer: Medicare Other

## 2023-06-30 DIAGNOSIS — E890 Postprocedural hypothyroidism: Secondary | ICD-10-CM | POA: Diagnosis not present

## 2023-06-30 DIAGNOSIS — E118 Type 2 diabetes mellitus with unspecified complications: Secondary | ICD-10-CM | POA: Diagnosis not present

## 2023-06-30 DIAGNOSIS — D509 Iron deficiency anemia, unspecified: Secondary | ICD-10-CM

## 2023-06-30 DIAGNOSIS — E782 Mixed hyperlipidemia: Secondary | ICD-10-CM

## 2023-06-30 LAB — CBC WITH DIFFERENTIAL/PLATELET
Basophils Absolute: 0 10*3/uL (ref 0.0–0.1)
Basophils Relative: 0.4 % (ref 0.0–3.0)
Eosinophils Absolute: 0.1 10*3/uL (ref 0.0–0.7)
Eosinophils Relative: 1.3 % (ref 0.0–5.0)
HCT: 30 % — ABNORMAL LOW (ref 36.0–46.0)
Hemoglobin: 9.6 g/dL — ABNORMAL LOW (ref 12.0–15.0)
Lymphocytes Relative: 20.2 % (ref 12.0–46.0)
Lymphs Abs: 1.5 10*3/uL (ref 0.7–4.0)
MCHC: 32 g/dL (ref 30.0–36.0)
MCV: 98.7 fL (ref 78.0–100.0)
Monocytes Absolute: 0.7 10*3/uL (ref 0.1–1.0)
Monocytes Relative: 8.8 % (ref 3.0–12.0)
Neutro Abs: 5.1 10*3/uL (ref 1.4–7.7)
Neutrophils Relative %: 69.3 % (ref 43.0–77.0)
Platelets: 184 10*3/uL (ref 150.0–400.0)
RBC: 3.04 Mil/uL — ABNORMAL LOW (ref 3.87–5.11)
RDW: 14.6 % (ref 11.5–15.5)
WBC: 7.4 10*3/uL (ref 4.0–10.5)

## 2023-06-30 LAB — LIPID PANEL
Cholesterol: 189 mg/dL (ref 0–200)
HDL: 86.4 mg/dL (ref >39.00)
LDL Cholesterol: 88 mg/dL (ref 0–99)
NonHDL: 102.68
Total CHOL/HDL Ratio: 2
Triglycerides: 75 mg/dL (ref 0.0–149.0)
VLDL: 15 mg/dL (ref 0.0–40.0)

## 2023-06-30 LAB — COMPREHENSIVE METABOLIC PANEL
ALT: 8 U/L (ref 0–35)
AST: 18 U/L (ref 0–37)
Albumin: 4.5 g/dL (ref 3.5–5.2)
Alkaline Phosphatase: 47 U/L (ref 39–117)
BUN: 19 mg/dL (ref 6–23)
CO2: 39 meq/L — ABNORMAL HIGH (ref 19–32)
Calcium: 10.2 mg/dL (ref 8.4–10.5)
Chloride: 97 meq/L (ref 96–112)
Creatinine, Ser: 0.65 mg/dL (ref 0.40–1.20)
GFR: 78.08 mL/min (ref >60.00)
Glucose, Bld: 129 mg/dL — ABNORMAL HIGH (ref 70–99)
Potassium: 4.5 meq/L (ref 3.5–5.1)
Sodium: 144 meq/L (ref 135–145)
Total Bilirubin: 0.5 mg/dL (ref 0.2–1.2)
Total Protein: 7 g/dL (ref 6.0–8.3)

## 2023-06-30 LAB — HEMOGLOBIN A1C: Hgb A1c MFr Bld: 5.6 % (ref 4.6–6.5)

## 2023-06-30 LAB — TSH: TSH: 1.9 u[IU]/mL (ref 0.35–5.50)

## 2023-07-08 ENCOUNTER — Emergency Department (HOSPITAL_COMMUNITY): Payer: Medicare Other

## 2023-07-08 ENCOUNTER — Inpatient Hospital Stay (HOSPITAL_COMMUNITY): Payer: Medicare Other

## 2023-07-08 ENCOUNTER — Encounter (HOSPITAL_COMMUNITY): Payer: Self-pay

## 2023-07-08 ENCOUNTER — Inpatient Hospital Stay (HOSPITAL_COMMUNITY)
Admission: EM | Admit: 2023-07-08 | Discharge: 2023-07-16 | DRG: 064 | Disposition: A | Discharge status: Skilled Nursing Facility | Payer: Medicare Other | Attending: Internal Medicine | Admitting: Internal Medicine

## 2023-07-08 DIAGNOSIS — Z23 Encounter for immunization: Secondary | ICD-10-CM

## 2023-07-08 DIAGNOSIS — I358 Other nonrheumatic aortic valve disorders: Secondary | ICD-10-CM | POA: Diagnosis not present

## 2023-07-08 DIAGNOSIS — J690 Pneumonitis due to inhalation of food and vomit: Secondary | ICD-10-CM | POA: Diagnosis not present

## 2023-07-08 DIAGNOSIS — D649 Anemia, unspecified: Secondary | ICD-10-CM | POA: Diagnosis present

## 2023-07-08 DIAGNOSIS — G8194 Hemiplegia, unspecified affecting left nondominant side: Secondary | ICD-10-CM | POA: Diagnosis present

## 2023-07-08 DIAGNOSIS — R1310 Dysphagia, unspecified: Secondary | ICD-10-CM | POA: Diagnosis present

## 2023-07-08 DIAGNOSIS — J439 Emphysema, unspecified: Secondary | ICD-10-CM | POA: Diagnosis present

## 2023-07-08 DIAGNOSIS — J440 Chronic obstructive pulmonary disease with acute lower respiratory infection: Secondary | ICD-10-CM | POA: Diagnosis not present

## 2023-07-08 DIAGNOSIS — L89151 Pressure ulcer of sacral region, stage 1: Secondary | ICD-10-CM | POA: Diagnosis present

## 2023-07-08 DIAGNOSIS — E785 Hyperlipidemia, unspecified: Secondary | ICD-10-CM | POA: Diagnosis not present

## 2023-07-08 DIAGNOSIS — R000 Tachycardia, unspecified: Secondary | ICD-10-CM | POA: Diagnosis not present

## 2023-07-08 DIAGNOSIS — Z9181 History of falling: Secondary | ICD-10-CM

## 2023-07-08 DIAGNOSIS — I499 Cardiac arrhythmia, unspecified: Secondary | ICD-10-CM | POA: Diagnosis not present

## 2023-07-08 DIAGNOSIS — Z79890 Hormone replacement therapy (postmenopausal): Secondary | ICD-10-CM

## 2023-07-08 DIAGNOSIS — Z8249 Family history of ischemic heart disease and other diseases of the circulatory system: Secondary | ICD-10-CM

## 2023-07-08 DIAGNOSIS — R270 Ataxia, unspecified: Secondary | ICD-10-CM | POA: Diagnosis present

## 2023-07-08 DIAGNOSIS — R471 Dysarthria and anarthria: Secondary | ICD-10-CM | POA: Diagnosis present

## 2023-07-08 DIAGNOSIS — I672 Cerebral atherosclerosis: Secondary | ICD-10-CM | POA: Diagnosis not present

## 2023-07-08 DIAGNOSIS — Z9981 Dependence on supplemental oxygen: Secondary | ICD-10-CM | POA: Diagnosis not present

## 2023-07-08 DIAGNOSIS — M1990 Unspecified osteoarthritis, unspecified site: Secondary | ICD-10-CM | POA: Diagnosis present

## 2023-07-08 DIAGNOSIS — R531 Weakness: Principal | ICD-10-CM

## 2023-07-08 DIAGNOSIS — J9601 Acute respiratory failure with hypoxia: Secondary | ICD-10-CM | POA: Diagnosis present

## 2023-07-08 DIAGNOSIS — R414 Neurologic neglect syndrome: Secondary | ICD-10-CM | POA: Diagnosis not present

## 2023-07-08 DIAGNOSIS — R64 Cachexia: Secondary | ICD-10-CM | POA: Diagnosis present

## 2023-07-08 DIAGNOSIS — D320 Benign neoplasm of cerebral meninges: Secondary | ICD-10-CM | POA: Diagnosis present

## 2023-07-08 DIAGNOSIS — I6381 Other cerebral infarction due to occlusion or stenosis of small artery: Secondary | ICD-10-CM | POA: Diagnosis not present

## 2023-07-08 DIAGNOSIS — E119 Type 2 diabetes mellitus without complications: Secondary | ICD-10-CM | POA: Diagnosis present

## 2023-07-08 DIAGNOSIS — R29711 NIHSS score 11: Secondary | ICD-10-CM | POA: Diagnosis present

## 2023-07-08 DIAGNOSIS — Z9011 Acquired absence of right breast and nipple: Secondary | ICD-10-CM

## 2023-07-08 DIAGNOSIS — Z7401 Bed confinement status: Secondary | ICD-10-CM | POA: Diagnosis not present

## 2023-07-08 DIAGNOSIS — T17908A Unspecified foreign body in respiratory tract, part unspecified causing other injury, initial encounter: Secondary | ICD-10-CM

## 2023-07-08 DIAGNOSIS — Z7951 Long term (current) use of inhaled steroids: Secondary | ICD-10-CM

## 2023-07-08 DIAGNOSIS — R918 Other nonspecific abnormal finding of lung field: Secondary | ICD-10-CM | POA: Diagnosis not present

## 2023-07-08 DIAGNOSIS — M810 Age-related osteoporosis without current pathological fracture: Secondary | ICD-10-CM | POA: Diagnosis present

## 2023-07-08 DIAGNOSIS — I6782 Cerebral ischemia: Secondary | ICD-10-CM | POA: Diagnosis not present

## 2023-07-08 DIAGNOSIS — E039 Hypothyroidism, unspecified: Secondary | ICD-10-CM | POA: Diagnosis present

## 2023-07-08 DIAGNOSIS — Z681 Body mass index (BMI) 19 or less, adult: Secondary | ICD-10-CM

## 2023-07-08 DIAGNOSIS — I2510 Atherosclerotic heart disease of native coronary artery without angina pectoris: Secondary | ICD-10-CM | POA: Diagnosis not present

## 2023-07-08 DIAGNOSIS — Z4682 Encounter for fitting and adjustment of non-vascular catheter: Secondary | ICD-10-CM | POA: Diagnosis not present

## 2023-07-08 DIAGNOSIS — J449 Chronic obstructive pulmonary disease, unspecified: Secondary | ICD-10-CM | POA: Diagnosis present

## 2023-07-08 DIAGNOSIS — J441 Chronic obstructive pulmonary disease with (acute) exacerbation: Secondary | ICD-10-CM | POA: Diagnosis present

## 2023-07-08 DIAGNOSIS — Z79899 Other long term (current) drug therapy: Secondary | ICD-10-CM

## 2023-07-08 DIAGNOSIS — Z7984 Long term (current) use of oral hypoglycemic drugs: Secondary | ICD-10-CM

## 2023-07-08 DIAGNOSIS — I69854 Hemiplegia and hemiparesis following other cerebrovascular disease affecting left non-dominant side: Secondary | ICD-10-CM | POA: Diagnosis not present

## 2023-07-08 DIAGNOSIS — I161 Hypertensive emergency: Secondary | ICD-10-CM | POA: Diagnosis not present

## 2023-07-08 DIAGNOSIS — I700 Atherosclerosis of aorta: Secondary | ICD-10-CM | POA: Diagnosis not present

## 2023-07-08 DIAGNOSIS — I615 Nontraumatic intracerebral hemorrhage, intraventricular: Principal | ICD-10-CM | POA: Diagnosis present

## 2023-07-08 DIAGNOSIS — I6529 Occlusion and stenosis of unspecified carotid artery: Secondary | ICD-10-CM | POA: Diagnosis not present

## 2023-07-08 DIAGNOSIS — E876 Hypokalemia: Secondary | ICD-10-CM | POA: Diagnosis present

## 2023-07-08 DIAGNOSIS — Z743 Need for continuous supervision: Secondary | ICD-10-CM | POA: Diagnosis not present

## 2023-07-08 DIAGNOSIS — R54 Age-related physical debility: Secondary | ICD-10-CM | POA: Diagnosis present

## 2023-07-08 DIAGNOSIS — I10 Essential (primary) hypertension: Secondary | ICD-10-CM | POA: Diagnosis not present

## 2023-07-08 DIAGNOSIS — R29810 Facial weakness: Secondary | ICD-10-CM | POA: Diagnosis present

## 2023-07-08 DIAGNOSIS — Z87891 Personal history of nicotine dependence: Secondary | ICD-10-CM

## 2023-07-08 DIAGNOSIS — R059 Cough, unspecified: Secondary | ICD-10-CM | POA: Diagnosis not present

## 2023-07-08 DIAGNOSIS — Z7982 Long term (current) use of aspirin: Secondary | ICD-10-CM

## 2023-07-08 DIAGNOSIS — Z808 Family history of malignant neoplasm of other organs or systems: Secondary | ICD-10-CM

## 2023-07-08 DIAGNOSIS — M6281 Muscle weakness (generalized): Secondary | ICD-10-CM | POA: Diagnosis not present

## 2023-07-08 DIAGNOSIS — Z66 Do not resuscitate: Secondary | ICD-10-CM | POA: Diagnosis not present

## 2023-07-08 DIAGNOSIS — I610 Nontraumatic intracerebral hemorrhage in hemisphere, subcortical: Secondary | ICD-10-CM | POA: Diagnosis not present

## 2023-07-08 DIAGNOSIS — I671 Cerebral aneurysm, nonruptured: Secondary | ICD-10-CM | POA: Diagnosis not present

## 2023-07-08 DIAGNOSIS — J9621 Acute and chronic respiratory failure with hypoxia: Secondary | ICD-10-CM | POA: Diagnosis not present

## 2023-07-08 DIAGNOSIS — E782 Mixed hyperlipidemia: Secondary | ICD-10-CM

## 2023-07-08 DIAGNOSIS — Z860100 Personal history of colon polyps, unspecified: Secondary | ICD-10-CM

## 2023-07-08 DIAGNOSIS — I619 Nontraumatic intracerebral hemorrhage, unspecified: Secondary | ICD-10-CM | POA: Diagnosis not present

## 2023-07-08 DIAGNOSIS — J180 Bronchopneumonia, unspecified organism: Secondary | ICD-10-CM | POA: Diagnosis not present

## 2023-07-08 DIAGNOSIS — E43 Unspecified severe protein-calorie malnutrition: Secondary | ICD-10-CM | POA: Diagnosis present

## 2023-07-08 DIAGNOSIS — R58 Hemorrhage, not elsewhere classified: Secondary | ICD-10-CM | POA: Diagnosis not present

## 2023-07-08 DIAGNOSIS — I618 Other nontraumatic intracerebral hemorrhage: Secondary | ICD-10-CM | POA: Diagnosis present

## 2023-07-08 DIAGNOSIS — I6389 Other cerebral infarction: Secondary | ICD-10-CM | POA: Diagnosis not present

## 2023-07-08 DIAGNOSIS — R29898 Other symptoms and signs involving the musculoskeletal system: Secondary | ICD-10-CM | POA: Diagnosis not present

## 2023-07-08 DIAGNOSIS — L8990 Pressure ulcer of unspecified site, unspecified stage: Secondary | ICD-10-CM | POA: Insufficient documentation

## 2023-07-08 DIAGNOSIS — K6389 Other specified diseases of intestine: Secondary | ICD-10-CM | POA: Diagnosis not present

## 2023-07-08 DIAGNOSIS — Z923 Personal history of irradiation: Secondary | ICD-10-CM

## 2023-07-08 DIAGNOSIS — Z853 Personal history of malignant neoplasm of breast: Secondary | ICD-10-CM

## 2023-07-08 DIAGNOSIS — R279 Unspecified lack of coordination: Secondary | ICD-10-CM | POA: Diagnosis not present

## 2023-07-08 DIAGNOSIS — W19XXXA Unspecified fall, initial encounter: Secondary | ICD-10-CM | POA: Diagnosis not present

## 2023-07-08 DIAGNOSIS — R0902 Hypoxemia: Secondary | ICD-10-CM | POA: Diagnosis not present

## 2023-07-08 DIAGNOSIS — R6889 Other general symptoms and signs: Secondary | ICD-10-CM | POA: Diagnosis not present

## 2023-07-08 DIAGNOSIS — Z85118 Personal history of other malignant neoplasm of bronchus and lung: Secondary | ICD-10-CM

## 2023-07-08 DIAGNOSIS — I629 Nontraumatic intracranial hemorrhage, unspecified: Secondary | ICD-10-CM | POA: Diagnosis not present

## 2023-07-08 LAB — COMPREHENSIVE METABOLIC PANEL
ALT: 16 U/L (ref 0–44)
AST: 41 U/L (ref 15–41)
Albumin: 3.5 g/dL (ref 3.5–5.0)
Alkaline Phosphatase: 75 U/L (ref 38–126)
Anion gap: 13 (ref 5–15)
BUN: 16 mg/dL (ref 8–23)
CO2: 34 mmol/L — ABNORMAL HIGH (ref 22–32)
Calcium: 9.5 mg/dL (ref 8.9–10.3)
Chloride: 90 mmol/L — ABNORMAL LOW (ref 98–111)
Creatinine, Ser: 0.62 mg/dL (ref 0.44–1.00)
GFR, Estimated: >60 mL/min (ref >60)
Glucose, Bld: 176 mg/dL — ABNORMAL HIGH (ref 70–99)
Potassium: 4.5 mmol/L (ref 3.5–5.1)
Sodium: 137 mmol/L (ref 135–145)
Total Bilirubin: 0.7 mg/dL (ref <1.2)
Total Protein: 6.9 g/dL (ref 6.5–8.1)

## 2023-07-08 LAB — DIFFERENTIAL
Abs Immature Granulocytes: 0.07 10*3/uL (ref 0.00–0.07)
Basophils Absolute: 0 10*3/uL (ref 0.0–0.1)
Basophils Relative: 0 %
Eosinophils Absolute: 0 10*3/uL (ref 0.0–0.5)
Eosinophils Relative: 0 %
Immature Granulocytes: 1 %
Lymphocytes Relative: 3 %
Lymphs Abs: 0.4 10*3/uL — ABNORMAL LOW (ref 0.7–4.0)
Monocytes Absolute: 0.8 10*3/uL (ref 0.1–1.0)
Monocytes Relative: 7 %
Neutro Abs: 10.9 10*3/uL — ABNORMAL HIGH (ref 1.7–7.7)
Neutrophils Relative %: 89 %

## 2023-07-08 LAB — TROPONIN I (HIGH SENSITIVITY)
Troponin I (High Sensitivity): 194 ng/L (ref <18)
Troponin I (High Sensitivity): 206 ng/L (ref <18)

## 2023-07-08 LAB — I-STAT CHEM 8, ED
BUN: 21 mg/dL (ref 8–23)
Calcium, Ion: 1.06 mmol/L — ABNORMAL LOW (ref 1.15–1.40)
Chloride: 94 mmol/L — ABNORMAL LOW (ref 98–111)
Creatinine, Ser: 0.6 mg/dL (ref 0.44–1.00)
Glucose, Bld: 187 mg/dL — ABNORMAL HIGH (ref 70–99)
HCT: 32 % — ABNORMAL LOW (ref 36.0–46.0)
Hemoglobin: 10.9 g/dL — ABNORMAL LOW (ref 12.0–15.0)
Potassium: 4.4 mmol/L (ref 3.5–5.1)
Sodium: 136 mmol/L (ref 135–145)
TCO2: 35 mmol/L — ABNORMAL HIGH (ref 22–32)

## 2023-07-08 LAB — CBC
HCT: 32.7 % — ABNORMAL LOW (ref 36.0–46.0)
Hemoglobin: 9.7 g/dL — ABNORMAL LOW (ref 12.0–15.0)
MCH: 29.8 pg (ref 26.0–34.0)
MCHC: 29.7 g/dL — ABNORMAL LOW (ref 30.0–36.0)
MCV: 100.6 fL — ABNORMAL HIGH (ref 80.0–100.0)
Platelets: 262 10*3/uL (ref 150–400)
RBC: 3.25 MIL/uL — ABNORMAL LOW (ref 3.87–5.11)
RDW: 12.6 % (ref 11.5–15.5)
WBC: 12.2 10*3/uL — ABNORMAL HIGH (ref 4.0–10.5)
nRBC: 0 % (ref 0.0–0.2)

## 2023-07-08 LAB — MRSA NEXT GEN BY PCR, NASAL: MRSA by PCR Next Gen: NOT DETECTED

## 2023-07-08 LAB — APTT: aPTT: 29 s (ref 24–36)

## 2023-07-08 LAB — ETHANOL: Alcohol, Ethyl (B): <10 mg/dL (ref <10)

## 2023-07-08 LAB — GLUCOSE, CAPILLARY
Glucose-Capillary: 142 mg/dL — ABNORMAL HIGH (ref 70–99)
Glucose-Capillary: 91 mg/dL (ref 70–99)

## 2023-07-08 LAB — PROTIME-INR
INR: 1 (ref 0.8–1.2)
Prothrombin Time: 12.9 s (ref 11.4–15.2)

## 2023-07-08 LAB — CBG MONITORING, ED: Glucose-Capillary: 193 mg/dL — ABNORMAL HIGH (ref 70–99)

## 2023-07-08 LAB — CK: Total CK: 452 U/L — ABNORMAL HIGH (ref 38–234)

## 2023-07-08 LAB — TSH: TSH: 1.04 u[IU]/mL (ref 0.350–4.500)

## 2023-07-08 MED ORDER — ACETAMINOPHEN 160 MG/5ML PO SOLN
650.0000 mg | ORAL | Status: DC | PRN
  Administered 2023-07-11 (×2): 650 mg
  Filled 2023-07-08 (×2): qty 20.3

## 2023-07-08 MED ORDER — IOHEXOL 350 MG/ML SOLN
75.0000 mL | Freq: Once | INTRAVENOUS | Status: AC | PRN
  Administered 2023-07-08: 75 mL via INTRAVENOUS

## 2023-07-08 MED ORDER — CLEVIDIPINE BUTYRATE 0.5 MG/ML IV EMUL
0.0000 mg/h | INTRAVENOUS | Status: DC
  Administered 2023-07-08: 2 mg/h via INTRAVENOUS
  Administered 2023-07-08 – 2023-07-09 (×2): 4 mg/h via INTRAVENOUS
  Administered 2023-07-09: 12 mg/h via INTRAVENOUS
  Administered 2023-07-09 (×2): 16 mg/h via INTRAVENOUS
  Administered 2023-07-09: 4 mg/h via INTRAVENOUS
  Administered 2023-07-10: 3 mg/h via INTRAVENOUS
  Filled 2023-07-08 (×8): qty 50

## 2023-07-08 MED ORDER — SODIUM CHLORIDE 0.9 % IV SOLN: INTRAVENOUS | Status: DC

## 2023-07-08 MED ORDER — BUDESONIDE 0.25 MG/2ML IN SUSP
0.2500 mg | Freq: Two times a day (BID) | RESPIRATORY_TRACT | Status: DC
  Administered 2023-07-08 – 2023-07-11 (×6): 0.25 mg via RESPIRATORY_TRACT
  Filled 2023-07-08 (×6): qty 2

## 2023-07-08 MED ORDER — STROKE: EARLY STAGES OF RECOVERY BOOK
Freq: Once | Status: AC
  Administered 2023-07-09: 1
  Filled 2023-07-08: qty 1

## 2023-07-08 MED ORDER — PANTOPRAZOLE SODIUM 40 MG IV SOLR
40.0000 mg | Freq: Every day | INTRAVENOUS | Status: DC
  Administered 2023-07-08 – 2023-07-11 (×4): 40 mg via INTRAVENOUS
  Filled 2023-07-08 (×3): qty 10

## 2023-07-08 MED ORDER — CLEVIDIPINE BUTYRATE 0.5 MG/ML IV EMUL
INTRAVENOUS | Status: AC
  Filled 2023-07-08: qty 100

## 2023-07-08 MED ORDER — ACETAMINOPHEN 650 MG RE SUPP
650.0000 mg | RECTAL | Status: DC | PRN
Start: 2023-07-08 — End: 2023-07-15

## 2023-07-08 MED ORDER — SENNOSIDES-DOCUSATE SODIUM 8.6-50 MG PO TABS
1.0000 | ORAL_TABLET | Freq: Two times a day (BID) | ORAL | Status: DC
  Administered 2023-07-09 – 2023-07-10 (×2): 1 via ORAL
  Filled 2023-07-08 (×2): qty 1

## 2023-07-08 MED ORDER — CHLORHEXIDINE GLUCONATE CLOTH 2 % EX PADS
6.0000 | MEDICATED_PAD | Freq: Every day | CUTANEOUS | Status: DC
  Administered 2023-07-08 – 2023-07-16 (×9): 6 via TOPICAL

## 2023-07-08 MED ORDER — ACETAMINOPHEN 325 MG PO TABS
650.0000 mg | ORAL_TABLET | ORAL | Status: DC | PRN
  Administered 2023-07-09 – 2023-07-13 (×2): 650 mg via ORAL
  Filled 2023-07-08 (×2): qty 2

## 2023-07-08 MED ORDER — INSULIN ASPART 100 UNIT/ML IJ SOLN
0.0000 [IU] | INTRAMUSCULAR | Status: DC
  Administered 2023-07-08: 1 [IU] via SUBCUTANEOUS
  Administered 2023-07-09 (×3): 2 [IU] via SUBCUTANEOUS
  Administered 2023-07-09 – 2023-07-10 (×2): 1 [IU] via SUBCUTANEOUS
  Administered 2023-07-10 – 2023-07-11 (×6): 2 [IU] via SUBCUTANEOUS
  Administered 2023-07-11 (×2): 3 [IU] via SUBCUTANEOUS
  Administered 2023-07-11 (×2): 2 [IU] via SUBCUTANEOUS
  Administered 2023-07-11: 1 [IU] via SUBCUTANEOUS
  Administered 2023-07-12: 2 [IU] via SUBCUTANEOUS
  Administered 2023-07-12: 3 [IU] via SUBCUTANEOUS
  Administered 2023-07-12: 2 [IU] via SUBCUTANEOUS
  Administered 2023-07-12: 3 [IU] via SUBCUTANEOUS
  Administered 2023-07-12 – 2023-07-13 (×3): 1 [IU] via SUBCUTANEOUS
  Administered 2023-07-13 (×3): 2 [IU] via SUBCUTANEOUS

## 2023-07-08 MED ORDER — ALBUTEROL SULFATE (2.5 MG/3ML) 0.083% IN NEBU
2.5000 mg | INHALATION_SOLUTION | Freq: Four times a day (QID) | RESPIRATORY_TRACT | Status: DC | PRN
  Administered 2023-07-08 – 2023-07-09 (×2): 2.5 mg via RESPIRATORY_TRACT
  Filled 2023-07-08 (×2): qty 3

## 2023-07-08 MED ORDER — LABETALOL HCL 5 MG/ML IV SOLN: 20.0000 mg | Freq: Once | INTRAVENOUS | Status: DC

## 2023-07-08 NOTE — Progress Notes (Signed)
Date and time results received: 07/08/23 1820 (use smartphrase ".now" to insert current time)  Test: troponin Critical Value: 194  Name of Provider Notified: Elmer Picker  Orders Received? Or Actions Taken?: Second set due at 1935, no new orders given

## 2023-07-08 NOTE — ED Provider Notes (Signed)
Charlotte Harris Provider Note   CSN: [id number] Arrival date & time: 07/08/23  1439     History  Chief Complaint  Patient presents with   Charlotte Harris    Charlotte Harris is a 89 y.o. female.  89 year old female with a past medical history of HTN, HLD, T2DM, hypothyroidism, COPD, prior tobacco use presents here for concerns of fall and left-sided weakness.  Patient states that she woke up around 5 to 5:30 AM this morning.  She fell while ambulating to the bathroom.  She landed on her outstretched hands and knees.  She did not hit her head.  She is not on anticoagulation.  She states that she did feel lightheaded, which she thinks caused her to fall. She is not complaining of any pain here.  Patient states that she noticed leaning towards the left side today.  She is unsure what time this started.  She states that she went to bed normally at 10:30 PM last night.  Her daughter tried to call her this morning, the patient was not able to get to the phone as she was on the floor after her fall.  This prompted the daughter to deploy EMS to check on her mother.  The patient's friend also came this afternoon to check on her and noticed that she was leaning to the left, which is atypical for her.  Patient also endorses being newly weak on the left.  The history is provided by the patient and a relative.       Home Medications Prior to Admission medications   Medication Sig Start Date End Date Taking? Authorizing Provider  acetaminophen (TYLENOL) 500 MG tablet Take 500 mg by mouth every 6 (six) hours as needed for headache (pain).     [provider]  albuterol (VENTOLIN HFA) 108 (90 Base) MCG/ACT inhaler Inhale 2 puffs into the lungs every 6 (six) hours as needed for wheezing or shortness of breath. 09/18/22   Josephine Igo, DO  aspirin EC 81 MG tablet Take 81 mg by mouth at bedtime.    [provider]  budesonide (PULMICORT) 0.25 MG/2ML  nebulizer solution USE (0.25 MG TOTAL) BY NEBULIZATION TWICE A DAY. 02/28/21   Koberlein, Paris Lore, MD  Calcium Carb-Cholecalciferol (CALCIUM 1000 + D PO) Take 2 tablets by mouth daily.    [provider]  levothyroxine (SYNTHROID) 50 MCG tablet TAKE 1 TABLET BY MOUTH EVERY DAY BEFORE BREAKFAST 05/28/23   Karie Georges, MD  losartan (COZAAR) 100 MG tablet Take 1 tablet (100 mg total) by mouth daily. 05/28/23   Karie Georges, MD  metFORMIN (GLUCOPHAGE-XR) 500 MG 24 hr tablet Take 1 tablet (500 mg total) by mouth daily. 05/28/23   Karie Georges, MD  metoprolol succinate (TOPROL-XL) 25 MG 24 hr tablet Take 1 tablet (25 mg total) by mouth daily. 05/28/23   Karie Georges, MD  NIFEdipine (ADALAT CC) 30 MG 24 hr tablet Take 1 tablet (30 mg total) by mouth daily. 05/28/23   Karie Georges, MD  pravastatin (PRAVACHOL) 20 MG tablet Take 1 tablet (20 mg total) by mouth daily. 05/28/23   Karie Georges, MD  Respiratory Therapy Supplies (NEBULIZER COMPRESSOR) KIT PLEASE DISPENSE 1 NEBULIZER MACHINE AND SUPPLIES DX: J44.9 07/21/18   Wynn Banker, MD  STIOLTO RESPIMAT 2.5-2.5 MCG/ACT AERS 2 puffs once daily 09/18/22   Icard, Bradley L, DO  vitamin B-12 (CYANOCOBALAMIN) 100 MCG tablet Take 100 mcg  by mouth daily.    [provider]      Allergies    Crestor [rosuvastatin calcium]    Review of Systems   As noted in HPI  Physical Exam Updated Vital Signs BP (!) 173/98   Pulse (!) 54   Temp 98.1 F (36.7 C) (Oral)   Resp (!) 23   Ht 5\' 5"  (1.651 m)   Wt 47.2 kg   SpO2 100%   BMI 17.32 kg/m  Physical Exam Vitals reviewed.  Constitutional:      General: She is not in acute distress.    Appearance: She is ill-appearing. She is not toxic-appearing or diaphoretic.  HENT:     Head: Normocephalic and atraumatic.     Nose: Nose normal.     Mouth/Throat:     Mouth: Mucous membranes are moist.     Pharynx: Oropharynx is clear. No oropharyngeal  exudate or posterior oropharyngeal erythema.  Eyes:     General: No visual field deficit or scleral icterus.    Extraocular Movements: Extraocular movements intact.     Conjunctiva/sclera: Conjunctivae normal.     Pupils: Pupils are equal, round, and reactive to light.  Cardiovascular:     Rate and Rhythm: Regular rhythm. Tachycardia present.     Pulses:          Radial pulses are 1+ on the right side and 1+ on the left side.       Dorsalis pedis pulses are 1+ on the right side and 1+ on the left side.     Heart sounds: Normal heart sounds. No murmur heard.    No friction rub. No gallop.  Pulmonary:     Effort: Pulmonary effort is normal. No respiratory distress.     Breath sounds: Examination of the right-upper field reveals decreased breath sounds. Examination of the left-upper field reveals decreased breath sounds. Examination of the right-lower field reveals decreased breath sounds. Examination of the left-lower field reveals decreased breath sounds. Decreased breath sounds present. No wheezing, rhonchi or rales.  Abdominal:     General: There is no distension.     Palpations: Abdomen is soft.     Tenderness: There is no abdominal tenderness. There is no guarding or rebound.  Musculoskeletal:     Right lower leg: No edema.     Left lower leg: No edema.  Neurological:     Mental Status: She is alert.     GCS: GCS eye subscore is 4. GCS verbal subscore is 5. GCS motor subscore is 6.     Cranial Nerves: Cranial nerve deficit (Tongue deviation to the left) present. No dysarthria or facial asymmetry.     Sensory: Sensation is intact. No sensory deficit.     Motor: Weakness (2/5 strength in left upper and left lower extremity.  5/5 strength in right upper and right lower extremity.) and pronator drift (In left upper extremity) present.     Coordination: Finger-Nose-Finger Test abnormal (Dysmetria in left upper extremity).     Comments: Gait evaluation deferred.  Naming and repetition  intact.     ED Results / Procedures / Treatments   Labs (all labs ordered are listed, but only abnormal results are displayed) Labs Reviewed  CBG MONITORING, ED - Abnormal; Notable for the following components:      Result Value   Glucose-Capillary 193 (*)    All other components within normal limits    EKG None  Radiology No results found.  Procedures Procedures  Medications Ordered in ED Medications - No data to display  ED Course/ Medical Decision Making/ A&P Clinical Course as of 07/09/23 0111  Thu Jul 08, 2023  1546 Being admitted to neuro ICU [JR]    Clinical Course User Index [JR] Rolla Flatten, MD                                 Medical Decision Making Amount and/or Complexity of Data Reviewed Labs: ordered. Radiology: ordered and independent interpretation performed.  Risk Decision regarding hospitalization.   89 year old female presents here after a fall.  Noted to be tachycardic and hypertensive on presentation.  On exam, patient is leaning to the left.  She has significant truncal instability.  Weakness noted in the left upper and left lower extremity with dysmetria on the left upper extremity.  Also has left tongue deviation.  Last known well is 10:30 PM yesterday.  Code stroke activated.  Patient discussed with neurology.  Labs, imaging, EKG, chest x-ray ordered.  Initial differential diagnosis includes hypoglycemia, CVA, spontaneous intracranial hemorrhage, electrolyte derangement, ACS, acute infection, thyroid abnormality.  I independently reviewed patient's CT head without contrast, which demonstrates a spontaneous intracranial hemorrhage.  Neurology is at bedside.  They are recommending mission to the ICU.  I independently reviewed the patient's chest x-ray, which appears stable from prior.    I independently reviewed the patient's ECG, which demonstrates sinus rhythm.  Normal intervals.  No axis deviation.  T wave inversions in V1-V2.  No ST  segment changes.  Patient admitted to neurology service.  Labs are still pending at the time of patient's transfer to the neuro ICU.  Patient's presentation is most consistent with acute presentation with potential threat to life or bodily function.         Final Clinical Impression(s) / ED Diagnoses Final diagnoses:  Left-sided weakness  Brain bleed [facility])    Rx / DC Orders ED Discharge Orders     None         Rolla Flatten, MD 07/09/23 0118    Rozelle Logan, DO 07/16/23 4540

## 2023-07-08 NOTE — ED Notes (Signed)
Patient transported to CT 

## 2023-07-08 NOTE — ED Notes (Signed)
MD notified and came at bedside.

## 2023-07-08 NOTE — H&P (Signed)
NEUROLOGY H&P NOTE   Date of service: July 08, 2023 Patient Name: Charlotte Harris MRN:  [id number] DOB:  January 19, 1932 Chief Complaint: "fall, left side weakness"  History of Present Illness  Charlotte Harris is a 89 y.o. female  has a past medical history of ADENOCARCINOMA, LUNG (07/03/2010), ASYMPTOMATIC POSTMENOPAUSAL STATUS (07/02/2009), Breast cancer (HCC) (1991), COLONIC POLYPS, HX OF (04/16/2007), COPD (05/17/2008), HEMATURIA, HX OF (04/16/2007), History of radiation therapy (07/30/09 to 08/09/09), HYPERLIPIDEMIA (04/16/2007), Hypertension, HYPERTENSION (04/16/2007), HYPOTHYROIDISM, POST-RADIATION (06/07/2008), Osteoarthritis, OSTEOPOROSIS (07/02/2009), and Wrist fracture, left. who presents with left sided weakness, left facial droop, left sensory deficit.   Patient states that she woke up around 5 to 5:30 AM this morning. She fell while ambulating to the bathroom. She landed on her outstretched hands and knees. She did not hit her head. She is not on anticoagulation. She states that she did feel lightheaded, which she thinks caused her to fall. She is not complaining of any pain here. Patient states that she noticed leaning towards the left side today. She is unsure what time this started. She states that she went to bed normally at 10:30 PM last night. Her daughter tried to call her this morning, the patient was not able to get to the phone as she was on the floor after her fall. This prompted the daughter to deploy EMS to check on her mother. The patient's friend also came this afternoon to check on her and noticed that she was leaning to the left, which is atypical for her. Patient also endorses being newly weak on the left.   Of note she does provide variable history to different providers, for example telling attending MD that she woke up and ambulated normally at 5:30 AM and then had weakness starting around 10:30 AM  Last known well: 2230 Modified rankin score: 2-Slight disability-UNABLE  to perform all activities but does not need assistance  Intracerebral Hemorrhage (ICH) Score Glascow Coma Score  13-15 0 Age >/= 80 yes +1 ICH volume >/= 30ml  no 0 IVH yes +1 Infratentorial origin no 0 Total:  2  ICH Volume: 6.1 tNKASE: Not offered due to ICH  Thrombectomy: not offered due to ICH   1a Level of Conscious.: 0 1b LOC Questions: 0 1c LOC Commands: 0 2 Best Gaze: 1 3 Visual: 0 4 Facial Palsy: 1 5a Motor Arm - left: 2 5b Motor Arm - Right: 0 6a Motor Leg - Left: 2 6b Motor Leg - Right: 0 7 Limb Ataxia: 2 8 Sensory: 2 9 Best Language: 0 10 Dysarthria: 1 11 Extinct. and Inatten.: 0 TOTAL: 11    ROS   A detailed 10 point review of system was completed and negative except above.  Past History   Past Medical History:  Diagnosis Date   ADENOCARCINOMA, LUNG 07/03/2010   RUL   ASYMPTOMATIC POSTMENOPAUSAL STATUS 07/02/2009   Breast cancer (HCC) 1991   R mastectomy, no chemo or radiation   COLONIC POLYPS, HX OF 04/16/2007   COPD 05/17/2008   HEMATURIA, HX OF 04/16/2007   History of radiation therapy 07/30/09 to 08/09/09   RUL lung   HYPERLIPIDEMIA 04/16/2007   Hypertension    HYPERTENSION 04/16/2007   HYPOTHYROIDISM, POST-RADIATION 06/07/2008   Osteoarthritis    OSTEOPOROSIS 07/02/2009   Wrist fracture, left    Past Surgical History:  Procedure Laterality Date   BREAST SURGERY     lumpectomy   EYE SURGERY Bilateral    cataracts   LUNG BIOPSY  08/31/11  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   LUNG BIOPSY  05/22/2009   RUL lung=Adenocarcinoma   MASTECTOMY  1991   right   TONSILLECTOMY  1955   Family History  Problem Relation Age of Onset   Heart attack Mother 6   Heart disease Mother    Cancer Sister        "throat" cancer   Heart disease Father    Cancer Brother        blood   Heart disease Sister    Cancer Brother        blood   Heart disease Other        CAD   Hypertension Other    Colon cancer Neg Hx    Stomach cancer Neg Hx     Social History   Socioeconomic History   Marital status: Widowed    Spouse name: Not on file   Number of children: Not on file   Years of education: Not on file   Highest education level: 12th grade  Occupational History   Occupation: Retired (worked Public librarian)    Employer: RETIRED  Tobacco Use   Smoking status: Former    Current packs/day: 0.00    Average packs/day: 1 pack/day for 50.0 years (50.0 ttl pk-yrs)    Types: Cigarettes    Start date: 08/04/1959    Quit date: 08/03/2009    Years since quitting: 13.9   Smokeless tobacco: Never  Vaping Use   Vaping status: Never Used  Substance and Sexual Activity   Alcohol use: Yes    Alcohol/week: 2.0 standard drinks of alcohol    Types: 2 Glasses of wine per week   Drug use: No   Sexual activity: Not on file  Other Topics Concern   Not on file  Social History Narrative   Widowed 2005   Quit smoking 2010   No other exposures, no TB hx   Social Determinants of Health   Financial Resource Strain: Low Risk  (05/10/2022)   Overall Financial Resource Strain (CARDIA)    Difficulty of Paying Living Expenses: Not hard at all  Food Insecurity: No Food Insecurity (12/31/2022)   Hunger Vital Sign    Worried About Running Out of Food in the Last Year: Never true    Ran Out of Food in the Last Year: Never true  Transportation Needs: No Transportation Needs (12/31/2022)   PRAPARE - Administrator, Civil Service (Medical): No    Lack of Transportation (Non-Medical): No  Physical Activity: Unknown (05/10/2022)   Exercise Vital Sign    Days of Exercise per Week: 0 days    Minutes of Exercise per Session: Not on file  Recent Concern: Physical Activity - Inactive (05/10/2022)   Exercise Vital Sign    Days of Exercise per Week: 0 days    Minutes of Exercise per Session: 0 min  Stress: No Stress Concern Present (05/10/2022)   [employer] of Occupational Health - Occupational Stress Questionnaire    Feeling of Stress :  Not at all  Social Connections: Unknown (05/10/2022)   Social Connection and Isolation Panel [NHANES]    Frequency of Communication with Friends and Family: Twice a week    Frequency of Social Gatherings with Friends and Family: Patient declined    Attends Religious Services: Patient declined    Database administrator or Organizations: No    Attends Engineer, structural: Not on file    Marital Status: Widowed   Allergies  Allergen Reactions   Crestor [Rosuvastatin Calcium]     cramping    Medications  (Not in a hospital admission)    Vitals   Vitals:   07/08/23 1445 07/08/23 1447 07/08/23 1448  BP: (!) 173/98    Pulse: (!) 54    Resp: (!) 23    Temp:   98.1 F (36.7 C)  TempSrc:   Oral  SpO2: 100%    Weight:  47.2 kg   Height:  5\' 5"  (1.651 m)      Body mass index is 17.32 kg/m.  Physical Exam   Constitutional: Appears well-developed and well-nourished.  Psych: Affect appropriate to situation.  Eyes: No scleral injection.  HENT: No OP obstruction.  Head: Normocephalic.  Cardiovascular: Normal rate and regular rhythm.  Respiratory: Effort normal, non-labored breathing.  GI: Soft.  No distension. There is no tenderness.  Skin: WDI.   Neurologic Examination   Neuro: Mental Status: Patient is awake, alert, oriented to person, place, month, year, and situation. Patient is able to give a clear and coherent history. No signs of aphasia or neglect Cranial Nerves: II: Visual Fields are full. Pupils are equal, round, and reactive to light.   III,IV, VI: Eyes midline, is able to cross but gaze preference to the right V: Facial sensation is symmetric to temperature VII: Left facial droop VIII: Hearing is intact to voice X: Palate elevates symmetrically XI: Shoulder shrug is symmetric. XII: Tongue protrudes midline without atrophy or fasciculations.  Motor: Tone is normal. Bulk is normal.  RUE 5/5 LUE 3/5 RLE 5/5 LLE 3/5  Sensory: No awareness of  noxious stimulation in the left upper extremity Cerebellar: FNF and HKS are intact on the right Left FNF and HKS ataxic slightly out of proportion to weakness    Labs   CBC:  Recent Labs  Lab 07/08/23 1538  HGB 10.9*  HCT 32.0*    Basic Metabolic Panel:  Lab Results  Component Value Date   NA 136 07/08/2023   K 4.4 07/08/2023   CO2 39 (H) 06/30/2023   GLUCOSE 187 (H) 07/08/2023   BUN 21 07/08/2023   CREATININE 0.60 07/08/2023   CALCIUM 10.2 06/30/2023   GFRNONAA NOT CALCULATED 07/20/2018   GFRAA NOT CALCULATED 07/20/2018   Lipid Panel:  Lab Results  Component Value Date   LDLCALC 88 06/30/2023   HgbA1c:  Lab Results  Component Value Date   HGBA1C 5.6 06/30/2023   Urine Drug Screen: No results found for: "LABOPIA", "COCAINSCRNUR", "LABBENZ", "AMPHETMU", "THCU", "LABBARB"  Alcohol Level No results found for: "ETH" INR  Lab Results  Component Value Date   INR 0.84 05/30/2012   APTT  Lab Results  Component Value Date   APTT 29 05/30/2012     CT Head without contrast(Personally reviewed): 1. 2.4 x 1.7 cm right thalamic hemorrhage with intraventricular extension. 2. 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 3. Age indeterminate infarcts in the left basal ganglia.  CT angio Head and Neck with contrast(Personally reviewed): 1. No evidence of active extravasation into the right thalamic hematoma. 2. No intracranial large vessel occlusion or significant stenosis. 3. Bilateral cavernous ICA aneurysms, measuring up to 14 mm on the left and 4 mm on the right. 4. 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 5. No hemodynamically significant stenosis in the neck. 6. Aortic atherosclerosis.  Impression   Alese Oats  Marsala is a 89 y.o. female presenting with left sided weakness; variable history patient provides may be due to some  element of neglect, encephalopathy or delirium.  CT shows right ICH with IVH.  Etiology most likely hypertensive given location and risk factors  Primary Diagnosis:  Right thalamic hemorrhage with IVH   Recommendations     CNS # Right Thalamic ICH  - Stroke labs HgbA1c, fasting lipid panel - MRI brain w/ and w/o when stabilized to eval for underlying mass  - Echocardiogram - Frequent neuro checks, q38min x 1 hr, then q1hr - CT head without contrast 6 hours after admission and with any neuro change (planned for 3 AM) - No antiplatelets due to ICH - DVT PPx heparin at 24 hrs if stable, SCDs for now - BP control goal SYS< 130-150  for first 24 hours, then <160  - Cleviprex IV and labetalol PRN  - PT consult, OT consult, Speech consult when patient stabilized  - Stroke team to follow - Close neuro monitoring Dysarthria Dysphagia following cerebral infarction  -NPO until passes a swallow screen -ST/PT/OT  # Bilateral cavernous ICA aneurysms, (14 mm on the left and 4 mm on the right) - Outpatient neuro IR referral   RESP Maintain O2 sat above 92%  CV Hypertension -Aggressive BP control, Blood Pressure Goal: SBP between 130-150 for 24 hours and then less than 160 Hyperlipidemia, unspecified  - Statin for goal LDL < 70 EKG - possible ST elevation? Likely hypertensive repolarization (reviewed with CCM) Troponin 194 -> trend   HEME CBC stable  ENDO Type 2 -accuchecks q4 until no longer NPO -SSI -goal HgbA1c < 7  GI/GU -Cr 0.6  Fluid/Electrolyte Disorders -Replete -Repeat labs -Trend  ID -CXR -NPO -Monitor  Nutrition -NPO until passes swallow screen   Prophylaxis DVT: SCDs GI: Protonoix Bowel: Senna ______________________________________________________________________   Patient seen and examined by NP/APP with MD. MD to update note as needed.   Elmer Picker, DNP, FNP-BC [facility] Pager: [phone]  Attending Neurologist's  note:  I personally saw this patient, gathering history, performing a full neurologic examination, reviewing relevant labs, personally reviewing relevant imaging including head CT and CTA, and formulated the assessment and plan, adding the note above for completeness and clarity to accurately reflect my thoughts   CRITICAL CARE Performed by:  Brooke Dare MD-PhD [facility] [phone]   Total critical care time: 40 minutes  Critical care time was exclusive of separately billable procedures and treating other patients.  Critical care was necessary to treat or prevent imminent or life-threatening deterioration.  Critical care was time spent personally by me on the following activities: development of treatment plan with patient and/or surrogate as well as nursing, discussions with consultants, evaluation of patient's response to treatment, examination of patient, obtaining history from patient or surrogate, ordering and performing treatments and interventions, ordering and review of laboratory studies, ordering and review of radiographic studies, pulse oximetry and re-evaluation of patient's condition.

## 2023-07-08 NOTE — Code Documentation (Signed)
Stroke Response Nurse Documentation Code Documentation  IVIONNA PENAS is a 89 y.o. female arriving to [facility]  via [facility] on 07/08/2023 with past medical hx significant of hypertension, COPD, diabetes, and hyperlipidemia. On aspirin 81 mg daily. Code stroke was activated by ED.   Patient from home where she was LKW at last night at 2230. Fell this morning around 0500. Family unable to reach her and EMS was sent to check on her. Noted to have left sided weakness.   Stroke team met patient in CT. NIHSS 8, see documentation for details and code stroke times. Patient with left facial droop, left arm weakness, left leg weakness, left decreased sensation, and dysarthria  on exam. The following imaging was completed:  CT Head and CTA. Patient is not a candidate for IV Thrombolytic due to hemorrhagic stroke. Elevated BP. Given labetalol and BP within goal. Elevated BP noted again. Cleviprex gtt started and titrated to goal. NP noted ataxia  reassessment in ED room.   Care Plan: admit to ICU; goal SBP 130-150; hourly VS and NIHSS.   Bedside handoff with ED RN and ICU RN.    Ferman Hamming Stroke Response RN

## 2023-07-08 NOTE — ED Provider Notes (Signed)
Patient seen in conjunction with the resident.  89 year old female who presented with left-sided weakness.  Patient admits that her last known normal was around 1030 last night when she went to bed.  She states that she woke up this morning around 5 AM to try to use the bathroom and this is when she fell down to the left side.  It was not until a friend came over later today that noted that she had left-sided weakness and EMS was called.  Daughter is from out of state but last video chatted with her yesterday at 5 PM.  States that she has been in her baseline status outside of an new intermittently productive cough.  Patient has noticeable left-sided deficits.  She has tongue deviation to the left as well as ataxia on the left.  Patient is considered LVO positive and since we are within 24 hours a code stroke was paged.  Stroke neurology team evaluated the patient, took her promptly to CT.  This is when we found that she had a brain bleed with intraventricular extension.  She is not on anticoagulation.  Blood pressure was slightly elevated so medication given.   daughter-in-law is at bedside, daughter was notified by me via telephone.  Further neuroimaging ordered and patient will be admitted to the neuro ICU.  Marland KitchenCritical Care  Performed by: Rozelle Logan, DO Authorized by: Rozelle Logan, DO   Critical care provider statement:    Critical care time (minutes):  50   Critical care time was exclusive of:  Separately billable procedures and treating other patients   Critical care was necessary to treat or prevent imminent or life-threatening deterioration of the following conditions:  CNS failure or compromise   Critical care was time spent personally by me on the following activities:  Development of treatment plan with patient or surrogate, discussions with consultants, evaluation of patient's response to treatment, examination of patient, ordering and review of laboratory studies, ordering and  review of radiographic studies, ordering and performing treatments and interventions, pulse oximetry, re-evaluation of patient's condition and review of old charts   I assumed direction of critical care for this patient from another provider in my specialty: no     Care discussed with: admitting provider       Rozelle Logan, DO 07/08/23 2157

## 2023-07-08 NOTE — TOC CM/SW Note (Signed)
Transition of Care [facility]) - Inpatient Brief Assessment   Patient Details  Name: Charlotte Harris MRN: [id number] Date of Birth: 05-31-1932  [facility]) CM/SW Contact:    Mearl Latin, LCSW Phone Number: 07/08/2023, 4:38 PM   Clinical Narrative: Patient admitted from home alone and is currently undergoing workup for ICH. No current TOC needs identified but please place consult as needs arise.     Transition of Care Asessment: Insurance and Status: Insurance coverage has been reviewed Patient has primary care physician: Yes Home environment has been reviewed: From home Prior level of function:: Independent Prior/Current Home Services: No current home services Social Determinants of Health Reivew: SDOH reviewed no interventions necessary Readmission risk has been reviewed: Yes Transition of care needs: no transition of care needs at this time

## 2023-07-08 NOTE — ED Triage Notes (Signed)
Slept at 1030pm last night. Woke up this AM and fell in the bathroom. EMS reports left sided weakness. Pt is alert and oriented x 3. Lives alone. Pt is leaning to left side.

## 2023-07-09 ENCOUNTER — Inpatient Hospital Stay (HOSPITAL_COMMUNITY): Payer: Medicare Other

## 2023-07-09 DIAGNOSIS — E785 Hyperlipidemia, unspecified: Secondary | ICD-10-CM | POA: Diagnosis not present

## 2023-07-09 DIAGNOSIS — I10 Essential (primary) hypertension: Secondary | ICD-10-CM

## 2023-07-09 DIAGNOSIS — E43 Unspecified severe protein-calorie malnutrition: Secondary | ICD-10-CM | POA: Insufficient documentation

## 2023-07-09 DIAGNOSIS — I619 Nontraumatic intracerebral hemorrhage, unspecified: Secondary | ICD-10-CM | POA: Diagnosis not present

## 2023-07-09 DIAGNOSIS — J9601 Acute respiratory failure with hypoxia: Secondary | ICD-10-CM

## 2023-07-09 DIAGNOSIS — I6389 Other cerebral infarction: Secondary | ICD-10-CM | POA: Diagnosis not present

## 2023-07-09 DIAGNOSIS — R000 Tachycardia, unspecified: Secondary | ICD-10-CM

## 2023-07-09 DIAGNOSIS — E119 Type 2 diabetes mellitus without complications: Secondary | ICD-10-CM

## 2023-07-09 DIAGNOSIS — I610 Nontraumatic intracerebral hemorrhage in hemisphere, subcortical: Secondary | ICD-10-CM | POA: Diagnosis not present

## 2023-07-09 LAB — URINALYSIS, W/ REFLEX TO CULTURE (INFECTION SUSPECTED)
Bacteria, UA: NONE SEEN
Bilirubin Urine: NEGATIVE
Glucose, UA: 50 mg/dL — AB
Ketones, ur: 20 mg/dL — AB
Leukocytes,Ua: NEGATIVE
Nitrite: NEGATIVE
Protein, ur: >=300 mg/dL — AB
Specific Gravity, Urine: 1.03 (ref 1.005–1.030)
pH: 7 (ref 5.0–8.0)

## 2023-07-09 LAB — POCT I-STAT 7, (LYTES, BLD GAS, ICA,H+H)
Acid-Base Excess: 11 mmol/L — ABNORMAL HIGH (ref 0.0–2.0)
Bicarbonate: 37.2 mmol/L — ABNORMAL HIGH (ref 20.0–28.0)
Calcium, Ion: 1.26 mmol/L (ref 1.15–1.40)
HCT: 26 % — ABNORMAL LOW (ref 36.0–46.0)
Hemoglobin: 8.8 g/dL — ABNORMAL LOW (ref 12.0–15.0)
O2 Saturation: 93 %
Patient temperature: 99.4
Potassium: 3.8 mmol/L (ref 3.5–5.1)
Sodium: 138 mmol/L (ref 135–145)
TCO2: 39 mmol/L — ABNORMAL HIGH (ref 22–32)
pCO2 arterial: 58.1 mm[Hg] — ABNORMAL HIGH (ref 32–48)
pH, Arterial: 7.416 (ref 7.35–7.45)
pO2, Arterial: 69 mm[Hg] — ABNORMAL LOW (ref 83–108)

## 2023-07-09 LAB — MAGNESIUM: Magnesium: 1.9 mg/dL (ref 1.7–2.4)

## 2023-07-09 LAB — ECHOCARDIOGRAM COMPLETE
AR max vel: 1.85 cm2
AV Area VTI: 1.68 cm2
AV Area mean vel: 1.87 cm2
AV Mean grad: 7.4 mm[Hg]
AV Peak grad: 13.8 mm[Hg]
Ao pk vel: 1.86 m/s
Area-P 1/2: 5.31 cm2
Height: 65 in
S' Lateral: 2.6 cm
Weight: 1188.72 [oz_av]

## 2023-07-09 LAB — RAPID URINE DRUG SCREEN, HOSP PERFORMED
Amphetamines: NOT DETECTED
Barbiturates: NOT DETECTED
Benzodiazepines: NOT DETECTED
Cocaine: NOT DETECTED
Opiates: NOT DETECTED
Tetrahydrocannabinol: NOT DETECTED

## 2023-07-09 LAB — GLUCOSE, CAPILLARY
Glucose-Capillary: 118 mg/dL — ABNORMAL HIGH (ref 70–99)
Glucose-Capillary: 105 mg/dL — ABNORMAL HIGH (ref 70–99)
Glucose-Capillary: 121 mg/dL — ABNORMAL HIGH (ref 70–99)
Glucose-Capillary: 159 mg/dL — ABNORMAL HIGH (ref 70–99)
Glucose-Capillary: 154 mg/dL — ABNORMAL HIGH (ref 70–99)
Glucose-Capillary: 200 mg/dL — ABNORMAL HIGH (ref 70–99)

## 2023-07-09 LAB — PHOSPHORUS: Phosphorus: 2.8 mg/dL (ref 2.5–4.6)

## 2023-07-09 MED ORDER — LEVOTHYROXINE SODIUM 50 MCG PO TABS
50.0000 ug | ORAL_TABLET | Freq: Every day | ORAL | Status: DC
  Administered 2023-07-10: 50 ug via ORAL
  Filled 2023-07-09: qty 1

## 2023-07-09 MED ORDER — METOPROLOL TARTRATE 5 MG/5ML IV SOLN
2.5000 mg | Freq: Four times a day (QID) | INTRAVENOUS | Status: DC
  Administered 2023-07-09: 2.5 mg via INTRAVENOUS
  Filled 2023-07-09: qty 5

## 2023-07-09 MED ORDER — OSMOLITE 1.5 CAL PO LIQD
1000.0000 mL | ORAL | Status: DC
Start: 2023-07-09 — End: 2023-07-13
  Administered 2023-07-09 – 2023-07-12 (×3): 1000 mL
  Filled 2023-07-09: qty 1000

## 2023-07-09 MED ORDER — HYDRALAZINE HCL 20 MG/ML IJ SOLN: 20.0000 mg | INTRAMUSCULAR | Status: DC | PRN

## 2023-07-09 MED ORDER — METOPROLOL TARTRATE 5 MG/5ML IV SOLN: 2.5000 mg | Freq: Once | INTRAVENOUS | Status: DC

## 2023-07-09 MED ORDER — ARFORMOTEROL TARTRATE 15 MCG/2ML IN NEBU
15.0000 ug | INHALATION_SOLUTION | Freq: Two times a day (BID) | RESPIRATORY_TRACT | Status: DC
  Administered 2023-07-09 – 2023-07-11 (×5): 15 ug via RESPIRATORY_TRACT
  Filled 2023-07-09 (×5): qty 2

## 2023-07-09 MED ORDER — METHYLPREDNISOLONE SODIUM SUCC 40 MG IJ SOLR
40.0000 mg | Freq: Every day | INTRAMUSCULAR | Status: DC
  Administered 2023-07-09 – 2023-07-11 (×3): 40 mg via INTRAVENOUS
  Filled 2023-07-09 (×4): qty 1

## 2023-07-09 MED ORDER — NIFEDIPINE ER OSMOTIC RELEASE 30 MG PO TB24
30.0000 mg | ORAL_TABLET | Freq: Every day | ORAL | Status: DC
  Administered 2023-07-09 – 2023-07-10 (×2): 30 mg via ORAL
  Filled 2023-07-09 (×2): qty 1

## 2023-07-09 MED ORDER — SODIUM CHLORIDE 0.9 % IV SOLN
2.0000 g | INTRAVENOUS | Status: AC
  Administered 2023-07-09 – 2023-07-13 (×5): 2 g via INTRAVENOUS
  Filled 2023-07-09 (×5): qty 20

## 2023-07-09 MED ORDER — HYDRALAZINE HCL 20 MG/ML IJ SOLN
20.0000 mg | INTRAMUSCULAR | Status: DC | PRN
  Administered 2023-07-12: 20 mg via INTRAVENOUS
  Filled 2023-07-09: qty 1

## 2023-07-09 MED ORDER — PROSOURCE TF20 ENFIT COMPATIBL EN LIQD
60.0000 mL | Freq: Every day | ENTERAL | Status: DC
  Administered 2023-07-09 – 2023-07-13 (×5): 60 mL
  Filled 2023-07-09 (×5): qty 60

## 2023-07-09 MED ORDER — GADOBUTROL 1 MMOL/ML IV SOLN
5.0000 mL | Freq: Once | INTRAVENOUS | Status: AC | PRN
  Administered 2023-07-09: 5 mL via INTRAVENOUS

## 2023-07-09 MED ORDER — ADULT MULTIVITAMIN W/MINERALS CH
1.0000 | ORAL_TABLET | Freq: Every day | ORAL | Status: DC
  Administered 2023-07-09 – 2023-07-14 (×6): 1
  Filled 2023-07-09 (×7): qty 1

## 2023-07-09 MED ORDER — CALCIUM GLUCONATE-NACL 2-0.675 GM/100ML-% IV SOLN
2.0000 g | Freq: Once | INTRAVENOUS | Status: AC
  Administered 2023-07-09: 2000 mg via INTRAVENOUS
  Filled 2023-07-09: qty 100

## 2023-07-09 MED ORDER — LOSARTAN POTASSIUM 50 MG PO TABS
100.0000 mg | ORAL_TABLET | Freq: Every day | ORAL | Status: DC
  Administered 2023-07-09 – 2023-07-10 (×2): 100 mg via ORAL
  Filled 2023-07-09 (×2): qty 2

## 2023-07-09 MED ORDER — METOPROLOL TARTRATE 5 MG/5ML IV SOLN
5.0000 mg | Freq: Once | INTRAVENOUS | Status: AC
  Administered 2023-07-09: 5 mg via INTRAVENOUS
  Filled 2023-07-09: qty 5

## 2023-07-09 MED ORDER — THIAMINE MONONITRATE 100 MG PO TABS
100.0000 mg | ORAL_TABLET | Freq: Every day | ORAL | Status: DC
  Administered 2023-07-09 – 2023-07-14 (×6): 100 mg
  Filled 2023-07-09 (×7): qty 1

## 2023-07-09 MED ORDER — METOPROLOL SUCCINATE ER 25 MG PO TB24
25.0000 mg | ORAL_TABLET | Freq: Every day | ORAL | Status: DC
  Administered 2023-07-09 – 2023-07-16 (×7): 25 mg via ORAL
  Filled 2023-07-09 (×8): qty 1

## 2023-07-09 MED ORDER — IPRATROPIUM-ALBUTEROL 0.5-2.5 (3) MG/3ML IN SOLN
3.0000 mL | Freq: Four times a day (QID) | RESPIRATORY_TRACT | Status: DC
  Administered 2023-07-09 – 2023-07-10 (×5): 3 mL via RESPIRATORY_TRACT
  Filled 2023-07-09 (×5): qty 3

## 2023-07-09 MED ORDER — LABETALOL HCL 5 MG/ML IV SOLN
20.0000 mg | INTRAVENOUS | Status: DC | PRN
  Administered 2023-07-10 – 2023-07-11 (×3): 20 mg via INTRAVENOUS
  Filled 2023-07-09 (×3): qty 4

## 2023-07-09 MED ORDER — HEPARIN SODIUM (PORCINE) 5000 UNIT/ML IJ SOLN
5000.0000 [IU] | Freq: Two times a day (BID) | INTRAMUSCULAR | Status: DC
  Administered 2023-07-09 – 2023-07-16 (×15): 5000 [IU] via SUBCUTANEOUS
  Filled 2023-07-09 (×15): qty 1

## 2023-07-09 MED ORDER — SODIUM CHLORIDE 0.9 % IV SOLN
500.0000 mg | INTRAVENOUS | Status: DC
  Administered 2023-07-09: 500 mg via INTRAVENOUS
  Filled 2023-07-09 (×3): qty 5

## 2023-07-09 MED ORDER — IOHEXOL 350 MG/ML SOLN
67.0000 mL | Freq: Once | INTRAVENOUS | Status: AC | PRN
  Administered 2023-07-09: 67 mL via INTRAVENOUS

## 2023-07-09 MED ORDER — METOPROLOL TARTRATE 5 MG/5ML IV SOLN
2.5000 mg | Freq: Once | INTRAVENOUS | Status: DC
  Filled 2023-07-09: qty 5

## 2023-07-09 MED ORDER — LABETALOL HCL 5 MG/ML IV SOLN: 20.0000 mg | INTRAVENOUS | Status: DC | PRN

## 2023-07-09 NOTE — Procedures (Signed)
Cortrak  Person Inserting Tube:  Maylon Peppers C, RD Tube Type:  Cortrak - 43 inches Tube Size:  10 Tube Location:  Left nare Secured by: Bridle Technique Used to Measure Tube Placement:  Marking at nare/corner of mouth Cortrak Secured At:  65 cm   Cortrak Tube Team Note:  Consult received to place a Cortrak feeding tube.   X-ray is required, abdominal x-ray has been ordered by the Cortrak team. Please confirm tube placement before using the Cortrak tube.   If the tube becomes dislodged please keep the tube and contact the Cortrak team at [email or web address] for replacement.  If after hours and replacement cannot be delayed, place a NG tube and confirm placement with an abdominal x-ray.    Lockie Pares., RD, LDN, CNSC See AMiON for contact information

## 2023-07-09 NOTE — Evaluation (Signed)
Clinical/Bedside Swallow Evaluation Patient Details  Name: Charlotte Harris MRN: [id number] Date of Birth: August 02, 1932  Today's Date: 07/09/2023 Time: SLP Start Time (ACUTE ONLY): 1140 SLP Stop Time (ACUTE ONLY): 1158 SLP Time Calculation (min) (ACUTE ONLY): 18 min  Past Medical History:  Past Medical History:  Diagnosis Date   ADENOCARCINOMA, LUNG 07/03/2010   RUL   ASYMPTOMATIC POSTMENOPAUSAL STATUS 07/02/2009   Breast cancer (HCC) 1991   R mastectomy, no chemo or radiation   COLONIC POLYPS, HX OF 04/16/2007   COPD 05/17/2008   HEMATURIA, HX OF 04/16/2007   History of radiation therapy 07/30/09 to 08/09/09   RUL lung   HYPERLIPIDEMIA 04/16/2007   Hypertension    HYPERTENSION 04/16/2007   HYPOTHYROIDISM, POST-RADIATION 06/07/2008   Osteoarthritis    OSTEOPOROSIS 07/02/2009   Wrist fracture, left    Past Surgical History:  Past Surgical History:  Procedure Laterality Date   BREAST SURGERY     lumpectomy   EYE SURGERY Bilateral    cataracts   LUNG BIOPSY  08/31/11   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   LUNG BIOPSY  05/22/2009   RUL lung=Adenocarcinoma   MASTECTOMY  1991   right   TONSILLECTOMY  [id number]   HPI:  89 year old female with prior history of adenocarcinoma of lung, breast cancer, hypertension hyperlipidemia, chronic hypoxic respiratory failure due to severe COPD on home oxygen who presented with right thalamic intraparenchymal hemorrhage with IV    Assessment / Plan / Recommendation  Clinical Impression  Pt demonstrates what appears to be an acute, respiratory based dysphagia. Pts RR is around 35 and HR 140. She has effortful rapid breathing, but is eager for sips of water. There is no sign of neurologic impairment to the swallow mechanism. When taking small single sips or bite sof puree pt appears to tolerate well without observable impairment. When she tries to drink more consecutively the timing of breathing and swallowing is taxed and pt has immediate cough.  Unless respiratory function stabilizes pt will not be ready for significant oral intake. She can have small single sips of water for comfort. Likely pt will have Cortrak placed for meds, but if needed she could take meds whole in puree. SLP Visit Diagnosis: Dysphagia, oropharyngeal phase (R13.12)    Aspiration Risk  Severe aspiration risk;Risk for inadequate nutrition/hydration    Diet Recommendation NPO;Alternative means - temporary;Free water protocol after oral care    Medication Administration: Whole meds with puree Supervision: Staff to assist with self feeding;Full supervision/cueing for compensatory strategies Compensations: Slow rate;Small sips/bites Postural Changes: Seated upright at 90 degrees    Other  Recommendations Oral Care Recommendations: Oral care QID    Recommendations for follow up therapy are one component of a multi-disciplinary discharge planning process, led by the attending physician.  Recommendations may be updated based on patient status, additional functional criteria and insurance authorization.  Follow up Recommendations Skilled nursing-short term rehab (<3 hours/day)      Assistance Recommended at Discharge    Functional Status Assessment Patient has had a recent decline in their functional status and demonstrates the ability to make significant improvements in function in a reasonable and predictable amount of time.  Frequency and Duration min 2x/week  2 weeks       Prognosis Prognosis for improved oropharyngeal function: Guarded Barriers to Reach Goals: Severity of deficits      Swallow Study   General HPI: 89 year old female with prior history of adenocarcinoma of lung, breast cancer, hypertension hyperlipidemia, chronic hypoxic  respiratory failure due to severe COPD on home oxygen who presented with right thalamic intraparenchymal hemorrhage with IV Type of Study: Bedside Swallow Evaluation Previous Swallow Assessment: noen Diet Prior to this  Study: NPO Temperature Spikes Noted: No Respiratory Status: Nasal cannula History of Recent Intubation: No Behavior/Cognition: Alert Oral Cavity Assessment: Within Functional Limits Oral Care Completed by SLP: No Oral Cavity - Dentition: Adequate natural dentition Vision: Functional for self-feeding Self-Feeding Abilities: Needs assist Patient Positioning: Upright in bed Baseline Vocal Quality: Normal Volitional Cough: Congested;Weak Volitional Swallow: Able to elicit    Oral/Motor/Sensory Function Overall Oral Motor/Sensory Function: Within functional limits   Ice Chips Ice chips: Within functional limits   Thin Liquid Thin Liquid: Impaired Presentation: Straw;Cup Pharyngeal  Phase Impairments: Cough - Immediate    Nectar Thick Nectar Thick Liquid: Not tested   Honey Thick Honey Thick Liquid: Not tested   Puree Puree: Within functional limits   Solid     Solid: Not tested      Charlotte Harris, Charlotte Harris 07/09/2023,1:54 PM

## 2023-07-09 NOTE — IPAL (Signed)
  Patient's wishes and code status discussed thoroughly at bedside with son, Gerlene Burdock and daughter, Deeann Saint. Present during discussion were RN Azucena Kuba, NP Richardo Priest, Dr. Pearlean Brownie and Dr. Merrily Pew.  DNR/DNI status entered per family's wishes.     [name], DNP, AGACNP-BC [facility] Please use AMION for contact information & EPIC for messaging.

## 2023-07-09 NOTE — Progress Notes (Signed)
Pt taken to CT and MRI with RN; upon completion of MRI pt in obvious respiratory distress; Pt sating below 90, tachypneic in the 40s and diaphoretic. Pt O2 bumped up to 6 and bed positioned in a 70 degree angle to help pts breathing. Pt talking in 3-5 word sentences, pierced lip labored breathing with head bobbing. While pt switched over to ICU equipment from MRI equipment RT called to meet at the room upon arrival back. Once back in room pt repositioned, Neb given, and pt placed on a HFNC. Stroke MD notified about COPD and came bedside to evaluate pt. Initially believed pt to be in afib but upon further investigation pt in ST. After settling pt appears much more comfortable sating in the 90's, no longer pierced lip breathing or head bobbing and breathing in the 20's.

## 2023-07-09 NOTE — Progress Notes (Signed)
PT Cancellation Note  Patient Details Name: Charlotte Harris MRN: [id number] DOB: 11-14-1931   Cancelled Treatment:    Reason Eval/Treat Not Completed: Active bedrest order  Marye Round, PT DPT [facility] Secure Chat Preferred  Office [phone]    Truddie Coco 07/09/2023, 1:41 PM

## 2023-07-09 NOTE — Progress Notes (Signed)
Initial Nutrition Assessment  DOCUMENTATION CODES:   Severe malnutrition in context of chronic illness, Underweight  INTERVENTION:   Initiate tube feeding via Cortrak tube: Osmolite 1.5 at 15 ml/h and increase by 10 ml every 8 hours to goal rate of 35 ml/h (840 ml per day) Prosource TF20 60 ml daily  Provides 1340 kcal, 72 gm protein, 638 ml free water daily  100 mg thiamine daily x 7 days  MVI with minerals x 10 days  Monitor magnesium and phosphorus every 12 hours x 4 occurrences, MD to replete as needed, as pt is at risk for refeeding syndrome given severe malnutrition.    NUTRITION DIAGNOSIS:   Severe Malnutrition related to chronic illness (severe COPD) as evidenced by severe fat depletion, severe muscle depletion.  GOAL:   Patient will meet greater than or equal to 90% of their needs  MONITOR:   TF tolerance  REASON FOR ASSESSMENT:   Consult Enteral/tube feeding initiation and management  ASSESSMENT:   Pt with PMH of lung cancer, breast cancer, HTN, hyperlipidemia, severe COPD on home O2 admitted with R thalamic IPH with IVH.   Pt failed swallow.  Reviewed previous pulmonary notes from outpatient visits. Pt began losing weight in 2020 and had gotten to a BMI of 17 but had maintained that weight for the last couple of years per notes.   Spoke with pt and daughter who is at bedside. Pt lives alone has helpers. She gets food delivered, has a helper cook sometimes and a neighbor also brings her food. Pt states she drinks 2 Boost per day.  Noted pt using assessory muscle for breathing, she reports this is normal for her.   Pt reports she was stable at 107 lb, she is now down to 74 lb which is a 31% weight loss in the last year.  Medications reviewed and include: SSI every 4 hours, solumedrol daily, pantoprazole, senna-docusate BID  Labs reviewed:  CBG's: 105-159    NUTRITION - FOCUSED PHYSICAL EXAM:  Flowsheet Row Most Recent Value  Orbital Region Severe  depletion  Upper Arm Region Severe depletion  Thoracic and Lumbar Region Severe depletion  Buccal Region Severe depletion  Temple Region Severe depletion  Clavicle Bone Region Severe depletion  Clavicle and Acromion Bone Region Severe depletion  Scapular Bone Region Severe depletion  Dorsal Hand Severe depletion  Patellar Region Severe depletion  Anterior Thigh Region Severe depletion  Posterior Calf Region Severe depletion  Edema (RD Assessment) None  Hair Reviewed  Eyes Reviewed  Mouth Reviewed  Skin Reviewed  Nails Reviewed       Diet Order:   Diet Order             Diet NPO time specified Except for: Other (See Comments), Ice Chips  Diet effective now                   EDUCATION NEEDS:   Education needs have been addressed  Skin:  Skin Assessment: Skin Integrity Issues: Skin Integrity Issues:: Stage I Stage I: coccyx  Last BM:  PTA  Height:   Ht Readings from Last 1 Encounters:  07/08/23 5\' 5"  (1.651 m)    Weight:   Wt Readings from Last 1 Encounters:  07/09/23 33.7 kg    BMI:  Body mass index is 12.36 kg/m.  Estimated Nutritional Needs:   Kcal:  1200-1400  Protein:  55-75 grams  Fluid:  >1.5 L/day  Cammy Copa., RD, LDN, CNSC See AMiON for contact information

## 2023-07-09 NOTE — Progress Notes (Signed)
*  PRELIMINARY RESULTS* Echocardiogram 2D Echocardiogram has been performed.  Charlotte Harris Charlotte Harris 07/09/2023, 2:08 PM

## 2023-07-09 NOTE — Consult Note (Signed)
NAME:  Charlotte Harris, MRN:  [id number], DOB:  06/11/1932, LOS: 1 ADMISSION DATE:  07/08/2023, CONSULTATION DATE:  07/09/2023 REFERRING MD: Stroke team, CHIEF COMPLAINT: Acute respiratory distress  History of Present Illness:  Charlotte Harris is a 89 year old female with an extensive past medical history significant for but not limited to severe COPD, right upper lobe lung cancer diagnosed 2011, breast cancer diagnosed 1991, hyperlipidemia, hypertension, and hypothyroidism to the ED 12/6 with left-sided weakness, left facial droop, and left sensory deficits concerning for acute stroke.  Last known normal was around 2230 night prior to admission.  CT head on admission positive for right thymic hemorrhage with IVH and age-indeterminate infarct to the left basal ganglia.  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  Patient was admitted per stroke team.  MRI brain obtained and revealed unchanged right thymic and frontal white matter intraparenchymal hematomas with IVH extension.  After completion of MRI patient was seen in significant respiratory distress with hypoxia.  Concern for aspiration event during MRI.  CCM consulted for further assistance in management.  Pertinent  Medical History  severe COPD, right upper lobe lung cancer diagnosed 2011, breast cancer diagnosed 1991, hyperlipidemia, hypertension, and hypothyroidism   Significant Hospital Events: Including procedures, antibiotic start and stop dates in addition to other pertinent events   12/06 admitted as a code stroke found to have thymic IVH.  Respiratory distress noted after MRI  Interim History / Subjective:  Seen lying in bed in no acute distress but observed accessory muscle use seen  Objective   Blood pressure 138/69, pulse (!) 134, temperature 99.4 F (37.4 C), temperature source Oral, resp. rate (!) 30, height 5\' 5"  (1.651 m), weight 27.1 kg, SpO2 93%.         Intake/Output Summary (Last 24 hours) at 07/09/2023 1011 Last data filed at 07/09/2023 0646 Gross per 24 hour  Intake 606.4 ml  Output 375 ml  Net 231.4 ml   Filed Weights   07/08/23 1447 07/08/23 1641  Weight: 47.2 kg 27.1 kg    Examination: General: Acute on chronic ill-appearing severely deconditioned cachectic elderly female seen lying in bed in mild respiratory distress HEENT: [place]/AT, MM pink/moist, PERRL,  Neuro: Alert and oriented x 3, nonfocal CV: s1s2 regular rate and rhythm, no murmur, rubs, or gallops,  PULM: Diminished air entry bilaterally, no increased work of breathing, no added breath sounds GI: soft, bowel sounds active in all 4 quadrants, non-tender, non-distended Extremities: warm/dry, no edema  Skin: no rashes or lesions  Resolved Hospital Problem list     Assessment & Plan:  Right thalamic ICH -MRI brain 12/6 stable  Bilateral cavernous ICA aneurysms P: Primary management per neurology Secondary stroke prevention Follow-up echocardiogram Frequent neurochecks in the ICU Repeat imaging per neurology PT/OT/SLP as able SBP goal per neuro  Acute hypoxic respiratory failure concerning for aspiration pneumonia -CTA obtained and negative for PE but consistent with likely aspiration event Severe COPD Gold stage IV -Postbronchodilator FEV1 20% predicted History of right upper lobe adenocarcinoma s/p radiation P: Continue supplemental oxygen for sat goal greater than 92 At risk for needing advanced airway given increased work of breathing Aspiration precautions Continue scheduled bronchodilators As needed BiPAP however would try to avoid as patient has already shown evidence of aspiration IV steroids  Sinus tachycardia -Likely multifactorial including acute respiratory distress and possible rebound tachycardia given missed p.o. beta-blocker Essential hypertension Hyperlipidemia -Home medications include aspirin,  Cozaar, Toprol-XL, and  pravastatin P: Resume home medications when able Scheduled IV beta-blocker while n.p.o. Continuous telemetry Monitor volume status  Type 2 diabetes -Home medication includes metformin P: Hold home metformin SSI CBG goal 140-180 CBG checks every 4   Best Practice (right click and "Reselect all SmartList Selections" daily)   Diet/type: NPO DVT prophylaxis: heparin injection 5,000 Units Start: 07/09/23 1000 SCD's Start: 07/08/23 1552  Pressure ulcer(s): not present on admission  GI prophylaxis: PPI Lines: N/A Foley:  N/A Code Status:  DNR Last date of multidisciplinary goals of care discussion: Continue current aggressive interventions, update patient and family daily  Labs   CBC: Recent Labs  Lab 07/08/23 1518 07/08/23 1538 07/09/23 0913  WBC 12.2*  --   --   NEUTROABS 10.9*  --   --   HGB 9.7* 10.9* 8.8*  HCT 32.7* 32.0* 26.0*  MCV 100.6*  --   --   PLT 262  --   --     Basic Metabolic Panel: Recent Labs  Lab 07/08/23 1518 07/08/23 1538 07/09/23 0913  NA 137 136 138  K 4.5 4.4 3.8  CL 90* 94*  --   CO2 34*  --   --   GLUCOSE 176* 187*  --   BUN 16 21  --   CREATININE 0.62 0.60  --   CALCIUM 9.5  --   --    GFR: Estimated Creatinine Clearance: 20.4 mL/min (by C-G formula based on SCr of 0.6 mg/dL). Recent Labs  Lab 07/08/23 1518  WBC 12.2*    Liver Function Tests: Recent Labs  Lab 07/08/23 1518  AST 41  ALT 16  ALKPHOS 75  BILITOT 0.7  PROT 6.9  ALBUMIN 3.5   No results for input(s): "LIPASE", "AMYLASE" in the last 168 hours. No results for input(s): "AMMONIA" in the last 168 hours.  ABG    Component Value Date/Time   PHART 7.416 07/09/2023 0913   PCO2ART 58.1 (H) 07/09/2023 0913   PO2ART 69 (L) 07/09/2023 0913   HCO3 37.2 (H) 07/09/2023 0913   TCO2 39 (H) 07/09/2023 0913   O2SAT 93 07/09/2023 0913     Coagulation Profile: Recent Labs  Lab 07/08/23 1518  INR 1.0    Cardiac Enzymes: Recent Labs  Lab 07/08/23 1518   CKTOTAL 452*    HbA1C: Hemoglobin A1C  Date/Time Value Ref Range Status  10/03/2018 12:00 AM 6  Final   Hgb A1c MFr Bld  Date/Time Value Ref Range Status  06/30/2023 11:07 AM 5.6 4.6 - 6.5 % Final    Comment:    Glycemic Control Guidelines for People with Diabetes:Non Diabetic:  <6%Goal of Therapy: <7%Additional Action Suggested:  >8%   05/11/2022 01:03 PM 5.7 4.6 - 6.5 % Final    Comment:    Glycemic Control Guidelines for People with Diabetes:Non Diabetic:  <6%Goal of Therapy: <7%Additional Action Suggested:  >8%     CBG: Recent Labs  Lab 07/08/23 1721 07/08/23 2004 07/08/23 2350 07/09/23 0410 07/09/23 0755  GLUCAP 142* 118* 91 105* 121*    Review of Systems:   Please see the history of present illness. All other systems reviewed and are negative   Past Medical History:  She,  has a past medical history of ADENOCARCINOMA, LUNG (07/03/2010), ASYMPTOMATIC POSTMENOPAUSAL STATUS (07/02/2009), Breast cancer (HCC) (1991), COLONIC POLYPS, HX OF (04/16/2007), COPD (05/17/2008), HEMATURIA, HX OF (04/16/2007), History of radiation therapy (07/30/09 to 08/09/09), HYPERLIPIDEMIA (04/16/2007), Hypertension, HYPERTENSION (04/16/2007), HYPOTHYROIDISM, POST-RADIATION (06/07/2008), Osteoarthritis, OSTEOPOROSIS (07/02/2009), and  Wrist fracture, left.   Surgical History:   Past Surgical History:  Procedure Laterality Date   BREAST SURGERY     lumpectomy   EYE SURGERY Bilateral    cataracts   LUNG BIOPSY  08/31/11   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   LUNG BIOPSY  05/22/2009   RUL lung=Adenocarcinoma   MASTECTOMY  1991   right   TONSILLECTOMY  1955     Social History:   reports that she quit smoking about 13 years ago. Her smoking use included cigarettes. She started smoking about 63 years ago. She has a 50 pack-year smoking history. She has never used smokeless tobacco. She reports current alcohol use of about 2.0 standard drinks of alcohol per week. She reports that she does not  use drugs.   Family History:  Her family history includes Cancer in her brother, brother, and sister; Heart attack (age of onset: 30) in her mother; Heart disease in her father, mother, sister, and another family member; Hypertension in an other family member. There is no history of Colon cancer or Stomach cancer.   Allergies Allergies  Allergen Reactions   Crestor [Rosuvastatin Calcium]     cramping     Home Medications  Prior to Admission medications   Medication Sig Start Date End Date Taking? Authorizing Provider  acetaminophen (TYLENOL) 500 MG tablet Take 500 mg by mouth every 6 (six) hours as needed for headache (pain).    Yes [provider]  albuterol (VENTOLIN HFA) 108 (90 Base) MCG/ACT inhaler Inhale 2 puffs into the lungs every 6 (six) hours as needed for wheezing or shortness of breath. 09/18/22  Yes Icard, Rachel Bo, DO  aspirin EC 81 MG tablet Take 81 mg by mouth at bedtime.   Yes [provider]  budesonide (PULMICORT) 0.25 MG/2ML nebulizer solution USE (0.25 MG TOTAL) BY NEBULIZATION TWICE A DAY. 02/28/21  Yes Koberlein, Junell C, MD  Calcium Carb-Cholecalciferol (CALCIUM 1000 + D PO) Take 2 tablets by mouth daily.   Yes [provider]  FERROUS SULFATE PO Take 1 tablet by mouth daily.   Yes [provider]  levothyroxine (SYNTHROID) 50 MCG tablet TAKE 1 TABLET BY MOUTH EVERY DAY BEFORE BREAKFAST 05/28/23  Yes Karie Georges, MD  losartan (COZAAR) 100 MG tablet Take 1 tablet (100 mg total) by mouth daily. 05/28/23  Yes Karie Georges, MD  metFORMIN (GLUCOPHAGE-XR) 500 MG 24 hr tablet Take 1 tablet (500 mg total) by mouth daily. 05/28/23  Yes Karie Georges, MD  metoprolol succinate (TOPROL-XL) 25 MG 24 hr tablet Take 1 tablet (25 mg total) by mouth daily. 05/28/23  Yes Karie Georges, MD  NIFEdipine (ADALAT CC) 30 MG 24 hr tablet Take 1 tablet (30 mg total) by mouth daily. 05/28/23  Yes Karie Georges, MD  pravastatin  (PRAVACHOL) 20 MG tablet Take 1 tablet (20 mg total) by mouth daily. 05/28/23  Yes Karie Georges, MD  STIOLTO RESPIMAT 2.5-2.5 MCG/ACT AERS 2 puffs once daily Patient taking differently: Take 2 puffs by mouth daily. 09/18/22  Yes Icard, Rachel Bo, DO  vitamin B-12 (CYANOCOBALAMIN) 100 MCG tablet Take 100 mcg by mouth daily.   Yes [provider]  Respiratory Therapy Supplies (NEBULIZER COMPRESSOR) KIT PLEASE DISPENSE 1 NEBULIZER MACHINE AND SUPPLIES DX: J44.9 07/21/18   Wynn Banker, MD     Critical care time:   CRITICAL CARE Performed by: Donesha Wallander D. Harris   Total critical care time: 40 minutes  Critical care time was exclusive of separately billable procedures and treating other patients.  Critical care was necessary to treat or prevent imminent or life-threatening deterioration.  Critical care was time spent personally by me on the following activities: development of treatment plan with patient and/or surrogate as well as nursing, discussions with consultants, evaluation of patient's response to treatment, examination of patient, obtaining history from patient or surrogate, ordering and performing treatments and interventions, ordering and review of laboratory studies, ordering and review of radiographic studies, pulse oximetry and re-evaluation of patient's condition.  Amybeth Sieg D. Harris, NP-C [facility] Personal contact information can be found on Amion  If no contact or response made please call [phone] 07/09/2023, 11:06 AM

## 2023-07-09 NOTE — Progress Notes (Addendum)
STROKE TEAM PROGRESS NOTE   BRIEF HPI [name] is a 89 y.o. female with PMNH significant for lung/breast cancer s/p radiation, COPD on 24hour oxygen at home, HTN, HLD, hypothyroidism, OA who presented 12/5 d/t left-sided weakness, left facial droop, left sensory deficit.  Imaging showed Right Thalamic ICH with IVE [id number] 12/5 mRs:1 ICH score: 2 No TNK or EVT offered due to ICH.  NIH on Admission: 59  SIGNIFICANT HOSPITAL EVENTS 12/5: Admitted due to right thalamic ICH with I'VE  12/6: CCM consulted due to acute respiratory distress noted after MRI.  DNR status per family's wishes  INTERIM HISTORY/SUBJECTIVE  She is tachycardic130-140s and tachypneic. High HR is likely due to current trouble breathing and tachypnea. Albeuterol changed to Due-nebs.  She has severe COPD at baseline and sees Dr. Timmothy Euler and daughter at bedside.   On exam, patient is oriented and can give clear history.  She has left hemiplegia with trouble controlling the left arm.  She also describes decreased sensation on the left arm.  CODE STATUS discussed with son and daughter at bedside (see IPAL note)   OBJECTIVE  CBC    Component Value Date/Time   WBC 12.2 (H) 07/08/2023 1518   RBC 3.25 (L) 07/08/2023 1518   HGB 10.9 (L) 07/08/2023 1538   HCT 32.0 (L) 07/08/2023 1538   PLT 262 07/08/2023 1518   MCV 100.6 (H) 07/08/2023 1518   MCH 29.8 07/08/2023 1518   MCHC 29.7 (L) 07/08/2023 1518   RDW 12.6 07/08/2023 1518   LYMPHSABS 0.4 (L) 07/08/2023 1518   MONOABS 0.8 07/08/2023 1518   EOSABS 0.0 07/08/2023 1518   BASOSABS 0.0 07/08/2023 1518    BMET    Component Value Date/Time   NA 136 07/08/2023 1538   K 4.4 07/08/2023 1538   CL 94 (L) 07/08/2023 1538   CO2 34 (H) 07/08/2023 1518   GLUCOSE 187 (H) 07/08/2023 1538   BUN 21 07/08/2023 1538   BUN 19.9 09/02/2015 1309   CREATININE 0.60 07/08/2023 1538   CREATININE 0.9 09/02/2015 1309   CALCIUM 9.5 07/08/2023 1518   CALCIUM 9.6  07/05/2012 1409   EGFR 61 (L) 09/02/2015 1309   GFRNONAA >60 07/08/2023 1518    IMAGING past 24 hours MR BRAIN W WO CONTRAST  Result Date: 07/09/2023 CLINICAL DATA:  Hemorrhagic stroke EXAM: MRI HEAD WITHOUT AND WITH CONTRAST TECHNIQUE: Multiplanar, multiecho pulse sequences of the brain and surrounding structures were obtained without and with intravenous contrast. CONTRAST:  5mL GADAVIST GADOBUTROL 1 MMOL/ML IV SOLN COMPARISON:  Head CT 07/09/2023 FINDINGS: Brain: Unchanged right thalamic and frontal white matter intraparenchymal hematomas with intraventricular extension. Magnetic susceptibility effects from the blood limit the assessment for acute ischemia on diffusion-weighted imaging. No abnormal diffusion restriction elsewhere within the brain. Chronic microhemorrhage in the left frontal lobe. There is confluent white matter hyperintense T2-weighted signal consistent with chronic ischemic microangiopathy. Unchanged anterior parafalcine meningioma. There is no abnormal contrast enhancement within the brain. Vascular: Normal flow voids Skull and upper cervical spine: Normal marrow signal. Sinuses/Orbits: Negative. Other: None. IMPRESSION: 1. Unchanged right thalamic and frontal white matter intraparenchymal hematomas with intraventricular extension. 2. No acute intracranial abnormality. 3. Unchanged anterior parafalcine meningioma. Electronically Signed   By: Deatra Robinson M.D.   On: 07/09/2023 04:07   CT HEAD WO CONTRAST ( )  Result Date: 07/09/2023 CLINICAL DATA:  Hemorrhage follow-up EXAM: CT HEAD WITHOUT CONTRAST TECHNIQUE: Contiguous axial images were obtained from the base of the skull through the  vertex without intravenous contrast. RADIATION DOSE REDUCTION: This exam was performed according to the departmental dose-optimization program which includes automated exposure control, adjustment of the mA and/or kV according to patient size and/or use of iterative reconstruction technique.  COMPARISON:  07/08/2023 head CT FINDINGS: Brain: Unchanged multifocal hemorrhage with intraparenchymal hematomas in the right frontal white matter and right thalamus, extending into the right lateral ventricle. Left anterior para falcine meningioma is unchanged. Size and configuration of the ventricles is unchanged. There is mass effect on the right lateral ventricle but no midline shift. Vascular: Carotid and vertebral artery atherosclerosis at the skull base. Skull: Negative Sinuses/Orbits: Paranasal sinuses are clear. No mastoid effusion. Normal orbits. Other: None. IMPRESSION: 1. Unchanged intraparenchymal hematomas in the right frontal white matter and right thalamus, extending into the right lateral ventricle. 2. Unchanged left anterior para falcine meningioma. Electronically Signed   By: Deatra Robinson M.D.   On: 07/09/2023 03:14   DG Chest Portable 1 View  Result Date: 07/08/2023 CLINICAL DATA:  Cough.  Evaluate for pneumonia. EXAM: PORTABLE CHEST 1 VIEW COMPARISON:  Chest radiograph dated 07/18/2018. CT dated 07/18/2018. FINDINGS: Background of emphysema. No focal consolidation, pleural effusion, pneumothorax. The cardiac silhouette is within normal limits. Atherosclerotic calcification of the aorta. Degenerative changes spine and scoliosis. Calcification of the right anterior chest wall. Multiple right axillary surgical clips. No acute osseous pathology. IMPRESSION: 1. No active disease. 2. Emphysema. Electronically Signed   By: Elgie Collard M.D.   On: 07/08/2023 17:30   CT ANGIO HEAD NECK W WO CM (CODE STROKE)  Result Date: 07/08/2023 CLINICAL DATA:  Stroke suspected, intracranial hemorrhage on noncontrast CT EXAM: CT ANGIOGRAPHY HEAD AND NECK WITH AND WITHOUT CONTRAST TECHNIQUE: Multidetector CT imaging of the head and neck was performed using the standard protocol during bolus administration of intravenous contrast. Multiplanar CT image reconstructions and MIPs were obtained to evaluate the  vascular anatomy. Carotid stenosis measurements (when applicable) are obtained utilizing NASCET criteria, using the distal internal carotid diameter as the denominator. RADIATION DOSE REDUCTION: This exam was performed according to the departmental dose-optimization program which includes automated exposure control, adjustment of the mA and/or kV according to patient size and/or use of iterative reconstruction technique. CONTRAST:  75mL OMNIPAQUE IOHEXOL 350 MG/ML SOLN COMPARISON:  No prior CTA available, correlation is made with 07/08/2023 CT head FINDINGS: CT HEAD FINDINGS For noncontrast findings, please see same day CT head. No evidence of active extravasation into the right thalamic hematoma. CTA NECK FINDINGS Evaluation is somewhat limited by motion artifact. Aortic arch: The aortic arch and branch vessel origins are incompletely included in the field of view. Within this limitation, no evidence of dissection or aneurysm. Aortic atherosclerosis. Right carotid system: No evidence of dissection, occlusion, or hemodynamically significant stenosis (greater than 50%). Left carotid system: No evidence of dissection, occlusion, or hemodynamically significant stenosis (greater than 50%). Atherosclerotic disease at the bifurcation and in the proximal ICA is not hemodynamically significant. Vertebral arteries: Left dominant system. Fusiform dilatation of the left V3 segment, which measures up to 10 mm (series 7, image 189), compared to the 4-5 mm diameter of the more proximal and distal left vertebral artery. The left vertebral artery is otherwise patent the skull base, without evidence of dissection. Mild stenosis at the origin of the right vertebral artery, which is otherwise patent to the skull base. No evidence of dissection Skeleton: No acute osseous abnormality. Degenerative changes in the cervical spine. Other neck: No acute finding. Upper chest: No pleural effusion.  Centrilobular and paraseptal emphysema.  Previously noted peripheral consolidation in the right upper lobe is incompletely imaged. Review of the MIP images confirms the above findings CTA HEAD FINDINGS Anterior circulation: Both internal carotid arteries are patent to the termini, without significant stenosis. Wide necked, medially directed outpouching from the right cavernous ICA measures up to 4 x 3 x 4 mm (series 7, image 120). Lobulated, wide neck, medially directed outpouching from the left cavernous ICA measures up to 14 x 6 x 8 mm (series 7, image 122). A1 segments patent. Normal anterior communicating artery. Anterior cerebral arteries are patent to their distal aspects without significant stenosis. No M1 stenosis or occlusion. MCA branches perfused to their distal aspects without significant stenosis. Posterior circulation: Vertebral arteries patent to the vertebrobasilar junction without significant stenosis. Basilar patent to its distal aspect without significant stenosis. Superior cerebellar arteries patent proximally. Patent P1 segments. PCAs perfused to their distal aspects without significant stenosis. Venous sinuses: As permitted by contrast timing, patent. Anatomic variants: None significant. No evidence of aneurysm or vascular malformation. Review of the MIP images confirms the above findings IMPRESSION: 1. No evidence of active extravasation into the right thalamic hematoma. 2. No intracranial large vessel occlusion or significant stenosis. 3. Bilateral cavernous ICA aneurysms, measuring up to 14 mm on the left and 4 mm on the right. 4. 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 5. No hemodynamically significant stenosis in the neck. 6. Aortic atherosclerosis. Aortic Atherosclerosis (ICD10-I70.0) and Emphysema (ICD10-J43.9). Electronically Signed   By: Wiliam Ke M.D.   On: 07/08/2023 16:21   CT HEAD CODE STROKE WO  CONTRAST  Result Date: 07/08/2023 CLINICAL DATA:  Code stroke.  Left-sided weakness EXAM: CT HEAD WITHOUT CONTRAST TECHNIQUE: Contiguous axial images were obtained from the base of the skull through the vertex without intravenous contrast. RADIATION DOSE REDUCTION: This exam was performed according to the departmental dose-optimization program which includes automated exposure control, adjustment of the mA and/or kV according to patient size and/or use of iterative reconstruction technique. COMPARISON:  Head CT 08/22/2008 FINDINGS: Brain: 2.4 x 1.7 cm right thalamic hemorrhage with intraventricular extension. There is a extensive periventricular white matter hypodensity, favored to represent sequela of severe chronic microvascular ischemic change. 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 There also age indeterminate infarcts in the left basal ganglia. Vascular: No hyperdense vessel or unexpected calcification. Skull: Normal. Negative for fracture or focal lesion. Sinuses/Orbits: No middle ear or mastoid effusion. Paranasal sinuses are notable for an air-fluid level in the left maxillary sinus, which can be seen in the setting of acute sinusitis. Bilateral lens replacement. Orbits are otherwise unremarkable. Other: None. IMPRESSION: 1. 2.4 x 1.7 cm right thalamic hemorrhage with intraventricular extension. 2. 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 3. Age indeterminate infarcts in the left basal ganglia. Findings were paged to Dr. Iver Nestle on 07/08/2023 at 3:41 p.m. Electronically Signed   By: Lorenza Cambridge M.D.   On: 07/08/2023 15:46    Vitals:   07/09/23 0615 07/09/23 0630 07/09/23 0645 07/09/23 0700  BP: (!) 166/87 (!) 152/72 125/72 138/69  Pulse: (!) 122 (!) 128 (!) 128 (!) 130  Resp: (!) 24 (!) 23 (!) 25 (!) 27  Temp:      TempSrc:      SpO2: 100% 96% 96% 96%  Weight:      Height:  PHYSICAL EXAM General:  Alert, frail, chronically ill-appearing  malnourished elderly Caucasian lady she appears to be in respiratory distress on exam. Psych:  Mood and affect appropriate for situation CV: Tachycardic, irregular Respiratory: Tachypneic with labored breathing at rest, on nasal cannula  NEURO:  Mental Status: She is alert and oriented, gives clear history. Speech/language: No dysarthria or aphasia present.  Naming and repetition intact  Cranial Nerves:  II: PERRL.  Decreased vs inconsistent left visual field III, IV, VI: EOMI. Eyelids elevate symmetrically.  V: Sensation is intact to light touch and symmetrical to face.  VII: left facial droop  VIII: hearing intact to voice. IX, X: Palate elevates symmetrically. Phonation is normal.  [id number] shrug 5/5. XII: tongue is midline without fasciculations. Motor:  5/5 RLE/RUE with no drift.  LUE 3/5 with significant drift and trouble controlling movement LLE 4-/5 proximal, able to lift knee off of and hold leg in bent position.  Tone: is normal and bulk is normal Sensation- decreased sensation noted to left arm. Coordination: Unable to perform FTN or HKS testing on left side. Gait- deferred  Most Recent NIH: 7   ASSESSMENT/PLAN  Intracerebral Hemorrhage:  right thalamic with intraventricular extension.   Etiology:  likely hypertensive, due to location and risk factors. Pending full work up.   CT head   2.4x1.7cm right thalamic hemorrhage with IVE 0.8x3.2 cm parafalcine meningioma Age-indeterminate infarcts in left basal ganglia CTA head & neck  No evidecne of acute extravasation No intracranial LVO Bilateral cavernous ICA aneurysms 14mm on left 4mm on right Follow-up CT: Unchanged hematomas extending into right lateral ventricle Unchanged left anterior meningioma  MRI   Unchanged IPH with IVE Unchanged falx meningioma  2D Echo: PENDING LDL 88 HgbA1c 5.6 VTE prophylaxis - SCDs aspirin 81 mg daily prior to admission, now on No antithrombotic due to ICH Therapy  recommendations:  Pending Disposition:  pending  Acute hypoxic respiratory failure ?  Aspiration pneumonia History of COPD Gold stage IV History of right upper lobe adenocarcinoma s/p radiation CCM consult, appreciate assistance CTA negative for PE, consistent with aspiration Aspiration precautions Continue scheduled bronchodilators Continue IV steroids  Hypertension Home meds:  losartan 100mg , metoprolol 25mg  Improving Cleviprex IV, wean as tolerated Labetalol IV Q2h PRN Blood Pressure Goal: SBP between 130-150 for 24 hours and then less than 160   Hyperlipidemia Home meds:  pravastatin 20mg  LDL 88, goal < 70 Statin to be started once patient is cleared for diet Continue statin at discharge  Diabetes type II Controlled Home meds:  metformin 500mg  HgbA1c 5.6, goal < 7.0 CBGs SSI Recommend close follow-up with PCP  Tobacco Abuse Patient is a former cigarette smoker  Dysphagia Patient has post-stroke dysphagia, SLP consulted    Diet   Diet NPO time specified  SLP consulted Advance diet as tolerated  Other Stroke Risk Factors Advanced Age  Other Active Problems Hx of adenocarcinoma, right upper lung s/p radiation Hx of Breast Cancer  Hospital day # 1   Pt seen by Neuro NP/APP and later by MD. Note/plan to be edited by MD as needed.    Charlotte January, DNP, AGACNP-BC [facility] Please use AMION for contact information & EPIC for messaging.  STROKE MD NOTE :  I have personally obtained history,examined this patient, reviewed notes, independently viewed imaging studies, participated in medical decision making and plan of care.ROS completed by me personally and pertinent positives fully documented  I have made any additions or clarifications directly to the above note. Agree  with note above.  Patient presented with left-sided weakness due to right thalamic hemorrhage with intraventricular extension with no hydrocephalus.  She remains at risk for  significant neurological worsening, hematoma expansion, development of hydrocephalus and respiratory failure.  Continue close neurological observation and strict blood pressure control as per post intracerebral hemorrhage protocol.  She also has severe COPD and appreciate critical care team to help in management of respiratory status and oxygenation.  Patient is DNR patient and family are clearly remarked on intubation cardiac compression.  Continue Cleviprex drip but add as needed IV labetalol and hydralazine with systolic goal between 130-150 for the first 24 hours and then below 160.  DVT and GI prophylaxis.  Physical Occupational Therapy and speech therapy consults.  Keep n.p.o. till cleared by speech therapy.  May need core track tube but will hold off till tomorrow due to tenuous respiratory status.  Long discussion with patient, son and daughter as well as Dr. Merrily Pew critical care medicine and answered questions. This patient is critically ill and at significant risk of neurological worsening, death and care requires constant monitoring of vital signs, hemodynamics,respiratory and cardiac monitoring, extensive review of multiple databases, frequent neurological assessment, discussion with family, other specialists and medical decision making of high complexity.I have made any additions or clarifications directly to the above note.This critical care time does not reflect procedure time, or teaching time or supervisory time of PA/NP/Med Resident etc but could involve care discussion time.  I spent 30 minutes of neurocritical care time  in the care of  this patient.     Delia Heady, MD Medical Director [facility] Pager: [phone] 07/09/2023 3:57 PM   To contact Stroke Continuity provider, please refer to [email or web address]. After hours, contact General Neurology

## 2023-07-10 ENCOUNTER — Other Ambulatory Visit: Payer: Self-pay

## 2023-07-10 DIAGNOSIS — I610 Nontraumatic intracerebral hemorrhage in hemisphere, subcortical: Secondary | ICD-10-CM | POA: Diagnosis not present

## 2023-07-10 DIAGNOSIS — J9601 Acute respiratory failure with hypoxia: Secondary | ICD-10-CM | POA: Diagnosis not present

## 2023-07-10 DIAGNOSIS — E119 Type 2 diabetes mellitus without complications: Secondary | ICD-10-CM | POA: Diagnosis not present

## 2023-07-10 DIAGNOSIS — L8990 Pressure ulcer of unspecified site, unspecified stage: Secondary | ICD-10-CM | POA: Insufficient documentation

## 2023-07-10 DIAGNOSIS — T17908A Unspecified foreign body in respiratory tract, part unspecified causing other injury, initial encounter: Secondary | ICD-10-CM

## 2023-07-10 DIAGNOSIS — I10 Essential (primary) hypertension: Secondary | ICD-10-CM | POA: Diagnosis not present

## 2023-07-10 DIAGNOSIS — E785 Hyperlipidemia, unspecified: Secondary | ICD-10-CM | POA: Diagnosis not present

## 2023-07-10 LAB — BASIC METABOLIC PANEL
Anion gap: 11 (ref 5–15)
BUN: 27 mg/dL — ABNORMAL HIGH (ref 8–23)
CO2: 37 mmol/L — ABNORMAL HIGH (ref 22–32)
Calcium: 10 mg/dL (ref 8.9–10.3)
Chloride: 92 mmol/L — ABNORMAL LOW (ref 98–111)
Creatinine, Ser: 0.64 mg/dL (ref 0.44–1.00)
GFR, Estimated: >60 mL/min (ref >60)
Glucose, Bld: 153 mg/dL — ABNORMAL HIGH (ref 70–99)
Potassium: 3.5 mmol/L (ref 3.5–5.1)
Sodium: 140 mmol/L (ref 135–145)

## 2023-07-10 LAB — CBC
HCT: 28.1 % — ABNORMAL LOW (ref 36.0–46.0)
Hemoglobin: 9 g/dL — ABNORMAL LOW (ref 12.0–15.0)
MCH: 31.1 pg (ref 26.0–34.0)
MCHC: 32 g/dL (ref 30.0–36.0)
MCV: 97.2 fL (ref 80.0–100.0)
Platelets: 305 10*3/uL (ref 150–400)
RBC: 2.89 MIL/uL — ABNORMAL LOW (ref 3.87–5.11)
RDW: 12.9 % (ref 11.5–15.5)
WBC: 17 10*3/uL — ABNORMAL HIGH (ref 4.0–10.5)
nRBC: 0 % (ref 0.0–0.2)

## 2023-07-10 LAB — GLUCOSE, CAPILLARY
Glucose-Capillary: 143 mg/dL — ABNORMAL HIGH (ref 70–99)
Glucose-Capillary: 157 mg/dL — ABNORMAL HIGH (ref 70–99)
Glucose-Capillary: 161 mg/dL — ABNORMAL HIGH (ref 70–99)
Glucose-Capillary: 173 mg/dL — ABNORMAL HIGH (ref 70–99)
Glucose-Capillary: 180 mg/dL — ABNORMAL HIGH (ref 70–99)
Glucose-Capillary: 190 mg/dL — ABNORMAL HIGH (ref 70–99)
Glucose-Capillary: 199 mg/dL — ABNORMAL HIGH (ref 70–99)

## 2023-07-10 LAB — MAGNESIUM
Magnesium: 2.1 mg/dL (ref 1.7–2.4)
Magnesium: 2.2 mg/dL (ref 1.7–2.4)

## 2023-07-10 LAB — PHOSPHORUS
Phosphorus: 2.7 mg/dL (ref 2.5–4.6)
Phosphorus: 3.2 mg/dL (ref 2.5–4.6)

## 2023-07-10 MED ORDER — AYR SALINE NASAL NA GEL
1.0000 | NASAL | Status: DC | PRN
  Filled 2023-07-10: qty 14.1

## 2023-07-10 MED ORDER — IPRATROPIUM-ALBUTEROL 0.5-2.5 (3) MG/3ML IN SOLN
3.0000 mL | Freq: Two times a day (BID) | RESPIRATORY_TRACT | Status: DC
Start: 2023-07-10 — End: 2023-07-11
  Administered 2023-07-10 – 2023-07-11 (×2): 3 mL via RESPIRATORY_TRACT
  Filled 2023-07-10 (×2): qty 3

## 2023-07-10 MED ORDER — SALINE SPRAY 0.65 % NA SOLN
1.0000 | NASAL | Status: DC | PRN
  Filled 2023-07-10: qty 44

## 2023-07-10 MED ORDER — METOPROLOL TARTRATE 5 MG/5ML IV SOLN
5.0000 mg | Freq: Once | INTRAVENOUS | Status: AC
  Administered 2023-07-10: 5 mg via INTRAVENOUS

## 2023-07-10 MED ORDER — AZITHROMYCIN 500 MG PO TABS
500.0000 mg | ORAL_TABLET | Freq: Every day | ORAL | Status: AC
  Administered 2023-07-10 – 2023-07-11 (×2): 500 mg
  Filled 2023-07-10 (×2): qty 1

## 2023-07-10 MED ORDER — SODIUM CHLORIDE 3 % IN NEBU
4.0000 mL | INHALATION_SOLUTION | Freq: Two times a day (BID) | RESPIRATORY_TRACT | Status: AC
  Administered 2023-07-10 – 2023-07-12 (×6): 4 mL via RESPIRATORY_TRACT
  Filled 2023-07-10 (×6): qty 4

## 2023-07-10 MED ORDER — LEVOTHYROXINE SODIUM 50 MCG PO TABS
50.0000 ug | ORAL_TABLET | Freq: Every day | ORAL | Status: DC
Start: 2023-07-11 — End: 2023-07-15
  Administered 2023-07-11 – 2023-07-15 (×5): 50 ug
  Filled 2023-07-10 (×5): qty 1

## 2023-07-10 MED ORDER — NIFEDIPINE ER OSMOTIC RELEASE 60 MG PO TB24
60.0000 mg | ORAL_TABLET | Freq: Every day | ORAL | Status: DC
  Filled 2023-07-10: qty 1

## 2023-07-10 MED ORDER — LOSARTAN POTASSIUM 50 MG PO TABS
100.0000 mg | ORAL_TABLET | Freq: Every day | ORAL | Status: DC
  Administered 2023-07-11 – 2023-07-14 (×4): 100 mg
  Filled 2023-07-10 (×5): qty 2

## 2023-07-10 MED ORDER — SENNOSIDES-DOCUSATE SODIUM 8.6-50 MG PO TABS
1.0000 | ORAL_TABLET | Freq: Two times a day (BID) | ORAL | Status: DC
  Administered 2023-07-10 – 2023-07-13 (×3): 1
  Filled 2023-07-10 (×7): qty 1

## 2023-07-10 MED ORDER — METOPROLOL TARTRATE 5 MG/5ML IV SOLN
INTRAVENOUS | Status: AC
  Filled 2023-07-10: qty 5

## 2023-07-10 NOTE — Plan of Care (Signed)
  Problem: Education: Goal: Knowledge of disease or condition will improve Outcome: Progressing Goal: Knowledge of secondary prevention will improve (MUST DOCUMENT ALL) Outcome: Progressing Goal: Knowledge of patient specific risk factors will improve Loraine Leriche N/A or DELETE if not current risk factor) Outcome: Progressing   Problem: Intracerebral Hemorrhage Tissue Perfusion: Goal: Complications of Intracerebral Hemorrhage will be minimized Outcome: Progressing   Problem: Coping: Goal: Will verbalize positive feelings about self Outcome: Progressing Goal: Will identify appropriate support needs Outcome: Progressing   Problem: Health Behavior/Discharge Planning: Goal: Ability to manage health-related needs will improve Outcome: Progressing Goal: Goals will be collaboratively established with patient/family Outcome: Progressing   Problem: Self-Care: Goal: Ability to participate in self-care as condition permits will improve Outcome: Progressing Goal: Verbalization of feelings and concerns over difficulty with self-care will improve Outcome: Progressing Goal: Ability to communicate needs accurately will improve Outcome: Progressing   Problem: Nutrition: Goal: Risk of aspiration will decrease Outcome: Progressing Goal: Dietary intake will improve Outcome: Progressing   Problem: Education: Goal: Ability to describe self-care measures that may prevent or decrease complications (Diabetes Survival Skills Education) will improve Outcome: Progressing Goal: Individualized Educational Video(s) Outcome: Progressing   Problem: Coping: Goal: Ability to adjust to condition or change in health will improve Outcome: Progressing   Problem: Fluid Volume: Goal: Ability to maintain a balanced intake and output will improve Outcome: Progressing   Problem: Health Behavior/Discharge Planning: Goal: Ability to identify and utilize available resources and services will improve Outcome:  Progressing Goal: Ability to manage health-related needs will improve Outcome: Progressing   Problem: Metabolic: Goal: Ability to maintain appropriate glucose levels will improve Outcome: Progressing   Problem: Nutritional: Goal: Maintenance of adequate nutrition will improve Outcome: Progressing Goal: Progress toward achieving an optimal weight will improve Outcome: Progressing   Problem: Skin Integrity: Goal: Risk for impaired skin integrity will decrease Outcome: Progressing   Problem: Tissue Perfusion: Goal: Adequacy of tissue perfusion will improve Outcome: Progressing   Problem: Education: Goal: Knowledge of General Education information will improve Description: Including pain rating scale, medication(s)/side effects and non-pharmacologic comfort measures Outcome: Progressing   Problem: Health Behavior/Discharge Planning: Goal: Ability to manage health-related needs will improve Outcome: Progressing   Problem: Clinical Measurements: Goal: Ability to maintain clinical measurements within normal limits will improve Outcome: Progressing Goal: Will remain free from infection Outcome: Progressing Goal: Diagnostic test results will improve Outcome: Progressing   Problem: Nutrition: Goal: Adequate nutrition will be maintained Outcome: Progressing   Problem: Coping: Goal: Level of anxiety will decrease Outcome: Progressing   Problem: Elimination: Goal: Will not experience complications related to urinary retention Outcome: Progressing   Problem: Pain Management: Goal: General experience of comfort will improve Outcome: Progressing   Problem: Safety: Goal: Ability to remain free from injury will improve Outcome: Progressing   Problem: Skin Integrity: Goal: Risk for impaired skin integrity will decrease Outcome: Progressing   Problem: Clinical Measurements: Goal: Respiratory complications will improve Outcome: Not Progressing Goal: Cardiovascular  complication will be avoided Outcome: Not Progressing   Problem: Activity: Goal: Risk for activity intolerance will decrease Outcome: Not Progressing   Problem: Elimination: Goal: Will not experience complications related to bowel motility Outcome: Not Progressing

## 2023-07-10 NOTE — Progress Notes (Addendum)
STROKE TEAM PROGRESS NOTE   BRIEF HPI [name] is a 89 y.o. female with PMNH significant for lung/breast cancer s/p radiation, COPD on 24hour oxygen at home, HTN, HLD, hypothyroidism, OA who presented 12/5 d/t left-sided weakness, left facial droop, left sensory deficit.  Imaging showed Right Thalamic ICH with IVE [id number] 12/5 mRs:1 ICH score: 2 No TNK or EVT offered due to ICH.  NIH on Admission: 41  SIGNIFICANT HOSPITAL EVENTS 12/5: Admitted due to right thalamic ICH with I'VE  12/6: CCM consulted due to acute respiratory distress noted after MRI.  DNR status per family's wishes   INTERIM HISTORY/SUBJECTIVE  Son and daughter at bedside.   Intermittent episodes of tachycardia and tachypnea overnight and this morning.  Patient states she feels that her breathing has improved. Discussed with CCM, appreciate assistance with management.   On exam, patient is oriented and can give clear history.  Continued left hemiplegia with decreased sensation on the left arm.   OBJECTIVE  CBC    Component Value Date/Time   WBC 17.0 (H) 07/10/2023 0507   RBC 2.89 (L) 07/10/2023 0507   HGB 9.0 (L) 07/10/2023 0507   HCT 28.1 (L) 07/10/2023 0507   PLT 305 07/10/2023 0507   MCV 97.2 07/10/2023 0507   MCH 31.1 07/10/2023 0507   MCHC 32.0 07/10/2023 0507   RDW 12.9 07/10/2023 0507   LYMPHSABS 0.4 (L) 07/08/2023 1518   MONOABS 0.8 07/08/2023 1518   EOSABS 0.0 07/08/2023 1518   BASOSABS 0.0 07/08/2023 1518    BMET    Component Value Date/Time   NA 140 07/10/2023 0507   K 3.5 07/10/2023 0507   CL 92 (L) 07/10/2023 0507   CO2 37 (H) 07/10/2023 0507   GLUCOSE 153 (H) 07/10/2023 0507   BUN 27 (H) 07/10/2023 0507   BUN 19.9 09/02/2015 1309   CREATININE 0.64 07/10/2023 0507   CREATININE 0.9 09/02/2015 1309   CALCIUM 10.0 07/10/2023 0507   CALCIUM 9.6 07/05/2012 1409   EGFR 61 (L) 09/02/2015 1309   GFRNONAA >60 07/10/2023 0507    IMAGING past 24 hours DG Abd Portable  1V  Result Date: 07/09/2023 CLINICAL DATA:  Feeding tube placement EXAM: PORTABLE ABDOMEN - 1 VIEW COMPARISON:  None Available. FINDINGS: Enteric tube tip overlies the mid stomach. Some air distended bowel in the upper abdomen IMPRESSION: Enteric tube tip overlies the mid stomach. Electronically Signed   By: Jasmine Pang M.D.   On: 07/09/2023 16:52   ECHOCARDIOGRAM COMPLETE  Result Date: 07/09/2023    ECHOCARDIOGRAM REPORT   Patient Name:   Charlotte Harris Date of Exam: 07/09/2023 Medical Rec #:  [id number]          Height:       65.0 in Accession #:    [id number]         Weight:       59.7 lb Date of Birth:  01/07/1932          BSA:          1.184 m Patient Age:    89 years           BP:           128/64 mmHg Patient Gender: F                  HR:           116 bpm. Exam Location:  Inpatient Procedure: 2D Echo, Cardiac Doppler and Color Doppler Indications:  Stroke I63.9  History:         Patient has prior history of Echocardiogram examinations, most                  recent 07/19/2018. COPD; Risk Factors:Former Smoker, Diabetes                  and Hypertension.  Sonographer:     Dondra Prader RVT RCS Referring Phys:  [id number] [facility] Diagnosing Phys: [facility]  Sonographer Comments: Technically challenging study due to limited acoustic windows and Technically difficult study due to poor echo windows. HR from 110-130's IMPRESSIONS  1. Left ventricular ejection fraction, by estimation, is 60 to 65%. The left ventricle has normal function. The left ventricle has no regional wall motion abnormalities. There is mild left ventricular hypertrophy. Indeterminate diastolic filling due to E-A fusion.  2. Right ventricular systolic function is normal. The right ventricular size is normal. There is moderately elevated pulmonary artery systolic pressure. The estimated right ventricular systolic pressure is 48.2 mmHg.  3. The mitral valve is myxomatous. No evidence of mitral valve regurgitation.  4. No  significant AS. There is moderate calcification of the aortic valve. Aortic valve regurgitation is not visualized.  5. The inferior vena cava is dilated in size with >50% respiratory variability, suggesting right atrial pressure of 8 mmHg. Conclusion(s)/Recommendation(s): No intracardiac source of embolism detected on this transthoracic study. Consider a transesophageal echocardiogram to exclude cardiac source of embolism if clinically indicated. FINDINGS  Left Ventricle: Left ventricular ejection fraction, by estimation, is 60 to 65%. The left ventricle has normal function. The left ventricle has no regional wall motion abnormalities. The left ventricular internal cavity size was normal in size. There is  mild left ventricular hypertrophy. Indeterminate diastolic filling due to E-A fusion. Right Ventricle: The right ventricular size is normal. Right ventricular systolic function is normal. There is moderately elevated pulmonary artery systolic pressure. The tricuspid regurgitant velocity is 3.17 m/s, and with an assumed right atrial pressure of 8 mmHg, the estimated right ventricular systolic pressure is 48.2 mmHg. Left Atrium: Left atrial size was normal in size. Right Atrium: Right atrial size was normal in size. Pericardium: There is no evidence of pericardial effusion. Mitral Valve: The mitral valve is myxomatous. No evidence of mitral valve regurgitation. Tricuspid Valve: Tricuspid valve regurgitation is mild. Aortic Valve: No significant AS. There is moderate calcification of the aortic valve. Aortic valve regurgitation is not visualized. Aortic valve mean gradient measures 7.4 mmHg. Aortic valve peak gradient measures 13.8 mmHg. Aortic valve area, by VTI measures 1.68 cm. Pulmonic Valve: Pulmonic valve regurgitation is not visualized. Aorta: The aortic root and ascending aorta are structurally normal, with no evidence of dilitation. Venous: The inferior vena cava is dilated in size with greater than 50%  respiratory variability, suggesting right atrial pressure of 8 mmHg. IAS/Shunts: No atrial level shunt detected by color flow Doppler.  LEFT VENTRICLE PLAX 2D LVIDd:         3.70 cm   Diastology LVIDs:         2.60 cm   LV e' medial:    7.18 cm/s LV PW:         1.10 cm   LV E/e' medial:  15.6 LV IVS:        0.90 cm   LV e' lateral:   15.60 cm/s LVOT diam:     1.80 cm   LV E/e' lateral: 7.2 LV SV:  46 LV SV Index:   39 LVOT Area:     2.54 cm  RIGHT VENTRICLE             IVC RV Basal diam:  3.00 cm     IVC diam: 2.20 cm RV S prime:     13.90 cm/s TAPSE (M-mode): 2.4 cm LEFT ATRIUM             Index        RIGHT ATRIUM           Index LA diam:        2.50 cm 2.11 cm/m   RA Area:     15.40 cm LA Vol (A2C):   26.8 ml 22.64 ml/m  RA Volume:   41.30 ml  34.89 ml/m LA Vol (A4C):   26.9 ml 22.73 ml/m LA Biplane Vol: 28.5 ml 24.08 ml/m  AORTIC VALVE                     PULMONIC VALVE AV Area (Vmax):    1.85 cm      PV Vmax:       1.34 m/s AV Area (Vmean):   1.87 cm      PV Peak grad:  7.2 mmHg AV Area (VTI):     1.68 cm AV Vmax:           185.60 cm/s AV Vmean:          124.300 cm/s AV VTI:            0.272 m AV Peak Grad:      13.8 mmHg AV Mean Grad:      7.4 mmHg LVOT Vmax:         135.20 cm/s LVOT Vmean:        91.140 cm/s LVOT VTI:          0.179 m LVOT/AV VTI ratio: 0.66  AORTA Ao Root diam: 2.60 cm Ao Asc diam:  3.70 cm MITRAL VALVE                TRICUSPID VALVE MV Area (PHT): 5.31 cm     TR Peak grad:   40.2 mmHg MV Decel Time: 143 msec     TR Vmax:        317.00 cm/s MV E velocity: 112.00 cm/s MV A velocity: 133.00 cm/s  SHUNTS MV E/A ratio:  0.84         Systemic VTI:  0.18 m                             Systemic Diam: 1.80 cm Carolan Clines Electronically signed by Carolan Clines Signature Date/Time: 07/09/2023/2:20:52 PM    Final (Updated)     Vitals:   07/10/23 0817 07/10/23 0900 07/10/23 0941 07/10/23 1025  BP:  (!) 160/82 (!) 150/75   Pulse:  (!) 121 (!) 120   Resp:  (!) 24    Temp:       TempSrc:      SpO2: 94% 93%  97%  Weight:      Height:        PHYSICAL EXAM General:  Alert, frail, chronically ill-appearing . CV: Tachycardic, irregular Respiratory: Labored breathing, on nasal cannula.  Tachypnea with any type of activity  NEURO:  Mental Status: She is alert and oriented, gives clear history. Speech/language: No dysarthria or aphasia present.  Naming and repetition intact  Cranial Nerves:  II:  PERRL.  Decreased vs inconsistent left visual field III, IV, VI: EOMI. Eyelids elevate symmetrically.  V: Sensation is intact to light touch and symmetrical to face.  VII: left facial droop, improved to slight droop VIII: hearing intact to voice. IX, X: Palate elevates symmetrically. Phonation is normal.  RJ:JOACZYSA shrug 5/5. XII: tongue is midline without fasciculations. Motor:  5/5 RLE/RUE with no drift.  LUE 4-/5 with drift and trouble controlling movement, 4-/5 grip LLE 4-/5 distal weaker than proximal  Tone: is normal and bulk is normal Sensation- decreased sensation continues to left arm. Coordination: Unable to perform FTN or HKS testing on left side. Gait- deferred  Most Recent NIH: 7   ASSESSMENT/PLAN  ICH:  right thalamic ICH with IVH, etiology likely hypertensive due to location and risk factors.    CT head  2.4x1.7cm right thalamic hemorrhage with I'VE. Age-indeterminate infarcts in left basal ganglia CTA head & neck No evidecne of acute extravasation, no intracranial LVO Follow-up CT: Unchanged hematomas extending into right lateral ventricle MRI  Unchanged IPH with IVE 2D Echo: LVEF 60 to 65%, mild LVH, moderate aortic valve calcification, no shunt LDL 88 HgbA1c 5.6 UDS negative VTE prophylaxis - SQ heparin  aspirin 81 mg daily prior to admission, continue No antithrombotic due to ICH Therapy recommendations:  Pending Disposition:  pending, likely SNF  Acute hypoxic respiratory distress ?  Aspiration pneumonia History of COPD Gold stage  IV History of right upper lobe adenocarcinoma s/p radiation CCM consult, appreciate assistance CTA negative for PE, consistent with aspiration Aspiration precautions Continue scheduled bronchodilators Continue IV steroids  Hypertension Continue Home meds:  losartan 100mg , metoprolol 25mg , nifedipine 30mg  continued Will increase Nifedipine to 60mg  Cleviprex IV, wean off as tolerated Labetalol IV Q2h PRN BP goal: now SBP less than 160  Hyperlipidemia Home meds:  pravastatin 20mg  LDL 88, goal < 70 Increase pravastatin to 40 on discharge Continue statin at discharge  Diabetes type II Controlled Home meds:  metformin 500mg  HgbA1c 5.6, goal < 7.0 CBGs SSI Recommend close follow-up with PCP  Cerebral aneurysms Bilateral cavernous ICA aneurysms, 14mm on left, 4mm on right. No intervention at this time given ICH Follow-up as outpatient  Dysphagia Patient has post-stroke dysphagia SLP consulted  currently n.p.o.  CoreTRak in place On tube feeding  Other Stroke Risk Factors Advanced Age Former smoker  Other Active Problems Hx of adenocarcinoma, right upper lung s/p radiation Hx of Breast Cancer Unchanged meningioma, CT and MRI head 0.8x3.2 cm parafalcine meningioma Tachycardia due to respiratory distress  Hospital day # 2   Pt seen by Neuro NP/APP and later by MD. Note/plan to be edited by MD as needed.    [name], DNP, AGACNP-BC [facility] Please use AMION for contact information & EPIC for messaging.  ATTENDING NOTE: I reviewed above note and agree with the assessment and plan. Pt was seen and examined.   Son and daughter are at the bedside. Mild SOB with tachypnea, but seems go be chronic per pt. Pt reclining in bed, awake, alert, eyes open, orientated to age, place, time and people. No aphasia, fluent language, following all simple commands, no dysarthria. Able to name and repeat. No gaze palsy, tracking bilaterally, visual field full, PERRL.  Left mild facial droop. Tongue midline. LUE 3-/5 with drift to bed within 10 sec. LLE 3-/5 with drift to bed within 5 sec. Sensation decreased on the left and with left sensory neglect, right FTN intact, left FTN ataxia but not out of proportion  to the weakness. gait not tested.   Discussed with CCM Dr. Vassie Loll regarding patient respiratory stress, patient DNR/DNI, no specific treatment at this moment but continue DuoNeb, antibiotics and steroids.  Close monitoring.  Increase p.o. BP meds, trying to wean off Cleviprex.  Cerebral aneurysms will follow-up as outpatient.  PT and OT pending.  For detailed assessment and plan, please refer to above/below as I have made changes wherever appropriate.   Marvel Plan, MD PhD Stroke Neurology 07/10/2023 11:10 AM  This patient is critically ill due to ICH, IVH, respiratory distress, hypertensive emergency and at significant risk of neurological worsening, death form hematoma expansion, brain herniation, respiratory failure, hypertensive encephalopathy. This patient's care requires constant monitoring of vital signs, hemodynamics, respiratory and cardiac monitoring, review of multiple databases, neurological assessment, discussion with family, other specialists and medical decision making of high complexity. I spent 40 minutes of neurocritical care time in the care of this patient. I had long discussion with son and daughter at bedside, updated pt current condition, treatment plan and potential prognosis, and answered all the questions.  They expressed understanding and appreciation.

## 2023-07-10 NOTE — Progress Notes (Signed)
PT Cancellation Note  Patient Details Name: ALISAN VERHOFF MRN: [id number] DOB: 10-30-1931   Cancelled Treatment:    Reason Eval/Treat Not Completed: (P) Patient not medically ready. Pt resting with HR jumping up to 178 bpm. RN notifying MD. PT will plan to hold at this time and follow-up later as time permits.   Virgil Benedict, PT, DPT Acute Rehabilitation Services  Office: [phone]    Bettina Gavia 07/10/2023, 8:34 AM

## 2023-07-10 NOTE — Progress Notes (Signed)
NAME:  Charlotte Harris, MRN:  [id number], DOB:  Jan 09, 1932, LOS: 2 ADMISSION DATE:  07/08/2023, CONSULTATION DATE:  07/09/2023 REFERRING MD: Stroke team, CHIEF COMPLAINT: Acute respiratory distress  History of Present Illness:  Charlotte Harris is a 89 year old female with an extensive past medical history significant for but not limited to severe COPD, right upper lobe lung cancer diagnosed 2011, breast cancer diagnosed 1991, hyperlipidemia, hypertension, and hypothyroidism to the ED 12/6 with left-sided weakness, left facial droop, and left sensory deficits concerning for acute stroke.  Last known normal was around 2230 night prior to admission.  CT head on admission positive for right thymic hemorrhage with IVH and age-indeterminate infarct to the left basal ganglia.  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  Patient was admitted per stroke team.  MRI brain obtained and revealed unchanged right thymic and frontal white matter intraparenchymal hematomas with IVH extension.  After completion of MRI patient was seen in significant respiratory distress with hypoxia.  Concern for aspiration event during MRI.  CCM consulted for further assistance in management.  Pertinent  Medical History  severe COPD, right upper lobe lung cancer diagnosed 2011, breast cancer diagnosed 1991, hyperlipidemia, hypertension, and hypothyroidism   Significant Hospital Events: Including procedures, antibiotic start and stop dates in addition to other pertinent events   12/06 admitted as a code stroke found to have thymic IVH.  Respiratory distress noted after MRI 12/7 intermittent episodes of tachypnea and persistent tachycardia overnight  Interim History / Subjective:  States she feels better this a.m. with improvement in dyspnea  Objective   Blood pressure 139/75, pulse (!) 117, temperature 99 F (37.2 C), temperature source Oral, resp. rate  (!) 26, height 5\' 5"  (1.651 m), weight 33.7 kg, SpO2 94%.    FiO2 (%):  [32 %] 32 %   Intake/Output Summary (Last 24 hours) at 07/10/2023 4098 Last data filed at 07/10/2023 0600 Gross per 24 hour  Intake 896.25 ml  Output 500 ml  Net 396.25 ml   Filed Weights   07/08/23 1447 07/08/23 1641 07/09/23 1000  Weight: 47.2 kg 27.1 kg 33.7 kg    Examination: General: Acute on chronic ill-appearing deconditioned frail elderly female lying in bed in no acute distress HEENT: [place]/AT, MM pink/moist, PERRL,  Neuro: Alert and oriented x 3, nonfocal globally weak CV: s1s2 regular rate and rhythm, no murmur, rubs, or gallops,  PULM: Diminished right, mild tachypnea, work of breathing, on 2.5 L nasal cannula GI: soft, bowel sounds active in all 4 quadrants, non-tender, non-distended Extremities: warm/dry, no edema  Skin: no rashes or lesions  Resolved Hospital Problem list     Assessment & Plan:  Right thalamic ICH -MRI brain 12/6 stable  Bilateral cavernous ICA aneurysms P: Primary management per cardiology Secondary stroke prevention Echo with normal systolic and diastolic function Frequent neurochecks in the ICU PT/OT/SLP  Acute hypoxic respiratory failure secondary to aspiration pneumonia -CTA obtained and negative for PE but consistent with likely aspiration event Severe COPD Gold stage IV -Postbronchodilator FEV1 20% predicted History of right upper lobe adenocarcinoma s/p radiation P: Supplemental oxygen for sat goal greater than 90 Scheduled bronchodilators Add hypertonic nebs And chest PT Nasal spray Continue IV steroids Aspiration precautions  Sinus tachycardia -Likely multifactorial including acute respiratory distress and possible rebound tachycardia given missed p.o. beta-blocker Essential hypertension Hyperlipidemia -Home medications include aspirin, Cozaar, Toprol-XL, and pravastatin P: Home medications resumed 12/6 continue Continuous telemetry  Monitor volume  status  Type 2 diabetes -Home medication includes metformin P: SSI CBG goal 140-180 CBG checks every 4  Best Practice (right click and "Reselect all SmartList Selections" daily)   Diet/type: NPO DVT prophylaxis: heparin injection 5,000 Units Start: 07/09/23 1000 SCD's Start: 07/08/23 1552  Pressure ulcer(s): not present on admission  GI prophylaxis: PPI Lines: N/A Foley:  N/A Code Status:  DNR Last date of multidisciplinary goals of care discussion: Continue current aggressive interventions, update patient and family daily  Critical care time: NA   Yared Barefoot D. Harris, NP-C  Pulmonary & Critical Care Personal contact information can be found on Amion  If no contact or response made please call [phone] 07/10/2023, 9:37 AM

## 2023-07-11 DIAGNOSIS — T17908A Unspecified foreign body in respiratory tract, part unspecified causing other injury, initial encounter: Secondary | ICD-10-CM | POA: Diagnosis not present

## 2023-07-11 DIAGNOSIS — J9621 Acute and chronic respiratory failure with hypoxia: Secondary | ICD-10-CM

## 2023-07-11 DIAGNOSIS — J449 Chronic obstructive pulmonary disease, unspecified: Secondary | ICD-10-CM | POA: Diagnosis not present

## 2023-07-11 DIAGNOSIS — I610 Nontraumatic intracerebral hemorrhage in hemisphere, subcortical: Secondary | ICD-10-CM | POA: Diagnosis not present

## 2023-07-11 DIAGNOSIS — E119 Type 2 diabetes mellitus without complications: Secondary | ICD-10-CM | POA: Diagnosis not present

## 2023-07-11 DIAGNOSIS — E785 Hyperlipidemia, unspecified: Secondary | ICD-10-CM | POA: Diagnosis not present

## 2023-07-11 DIAGNOSIS — I10 Essential (primary) hypertension: Secondary | ICD-10-CM | POA: Diagnosis not present

## 2023-07-11 LAB — BASIC METABOLIC PANEL
Anion gap: 10 (ref 5–15)
BUN: 31 mg/dL — ABNORMAL HIGH (ref 8–23)
CO2: 37 mmol/L — ABNORMAL HIGH (ref 22–32)
Calcium: 9.3 mg/dL (ref 8.9–10.3)
Chloride: 94 mmol/L — ABNORMAL LOW (ref 98–111)
Creatinine, Ser: 0.72 mg/dL (ref 0.44–1.00)
GFR, Estimated: >60 mL/min (ref >60)
Glucose, Bld: 180 mg/dL — ABNORMAL HIGH (ref 70–99)
Potassium: 3.8 mmol/L (ref 3.5–5.1)
Sodium: 141 mmol/L (ref 135–145)

## 2023-07-11 LAB — CBC
HCT: 26.7 % — ABNORMAL LOW (ref 36.0–46.0)
Hemoglobin: 8.3 g/dL — ABNORMAL LOW (ref 12.0–15.0)
MCH: 30.6 pg (ref 26.0–34.0)
MCHC: 31.1 g/dL (ref 30.0–36.0)
MCV: 98.5 fL (ref 80.0–100.0)
Platelets: 298 10*3/uL (ref 150–400)
RBC: 2.71 MIL/uL — ABNORMAL LOW (ref 3.87–5.11)
RDW: 12.9 % (ref 11.5–15.5)
WBC: 19.7 10*3/uL — ABNORMAL HIGH (ref 4.0–10.5)
nRBC: 0 % (ref 0.0–0.2)

## 2023-07-11 LAB — GLUCOSE, CAPILLARY
Glucose-Capillary: 205 mg/dL — ABNORMAL HIGH (ref 70–99)
Glucose-Capillary: 138 mg/dL — ABNORMAL HIGH (ref 70–99)
Glucose-Capillary: 171 mg/dL — ABNORMAL HIGH (ref 70–99)
Glucose-Capillary: 220 mg/dL — ABNORMAL HIGH (ref 70–99)
Glucose-Capillary: 200 mg/dL — ABNORMAL HIGH (ref 70–99)

## 2023-07-11 LAB — PHOSPHORUS: Phosphorus: 2.5 mg/dL (ref 2.5–4.6)

## 2023-07-11 LAB — MAGNESIUM: Magnesium: 2.2 mg/dL (ref 1.7–2.4)

## 2023-07-11 MED ORDER — METHYLPREDNISOLONE SODIUM SUCC 40 MG IJ SOLR
20.0000 mg | Freq: Every day | INTRAMUSCULAR | Status: AC
  Administered 2023-07-12 – 2023-07-13 (×2): 20 mg via INTRAVENOUS
  Filled 2023-07-11 (×2): qty 1

## 2023-07-11 MED ORDER — IPRATROPIUM-ALBUTEROL 0.5-2.5 (3) MG/3ML IN SOLN
3.0000 mL | Freq: Two times a day (BID) | RESPIRATORY_TRACT | Status: DC | PRN
  Administered 2023-07-12: 3 mL via RESPIRATORY_TRACT
  Filled 2023-07-11: qty 3

## 2023-07-11 MED ORDER — PRAVASTATIN SODIUM 40 MG PO TABS
40.0000 mg | ORAL_TABLET | Freq: Every day | ORAL | Status: DC
Start: 2023-07-12 — End: 2023-07-15
  Administered 2023-07-12 – 2023-07-14 (×3): 40 mg
  Filled 2023-07-11 (×3): qty 1

## 2023-07-11 MED ORDER — BUDESONIDE 0.5 MG/2ML IN SUSP
0.5000 mg | Freq: Two times a day (BID) | RESPIRATORY_TRACT | Status: DC
  Administered 2023-07-11 – 2023-07-16 (×10): 0.5 mg via RESPIRATORY_TRACT
  Filled 2023-07-11 (×10): qty 2

## 2023-07-11 MED ORDER — PRAVASTATIN SODIUM 40 MG PO TABS: 40.0000 mg | ORAL_TABLET | Freq: Every day | ORAL | Status: DC

## 2023-07-11 MED ORDER — NIFEDIPINE ER OSMOTIC RELEASE 60 MG PO TB24
90.0000 mg | ORAL_TABLET | Freq: Every day | ORAL | Status: DC
  Administered 2023-07-11 – 2023-07-16 (×5): 90 mg via ORAL
  Filled 2023-07-11 (×7): qty 1

## 2023-07-11 NOTE — Progress Notes (Signed)
Speech Language Pathology Treatment: Dysphagia  Patient Details Name: TRAVON MERRILL MRN: [id number] DOB: 1932/01/02 Today's Date: 07/11/2023 Time: 4098-1191 SLP Time Calculation (min) (ACUTE ONLY): 24 min  Assessment / Plan / Recommendation Clinical Impression  ST follow up for skilled treatment to re assess her swallowing. Per family present patient's breathing is at baseline and not likely to get much better.  However, patient reported she felt it was better but not totally back to baseline.  She is on less O2 now than she normally is at home.    Cranial nerve exam was completed.  Lingual, labial, facial and jaw range of motion and strength appeared to be adequate.  Facial sensation appeared to be intact. However, she reported that sensation was less on the entire left side of her face.    She was presented with ice chips, thin liquids via spoon, cup and straw and pureed material.  Oral transport appeared to be adequate with good oral clearance for presented trials.  Swallow trigger was appreciated to palpation.  Patient with cough following ice chips that was not replicated given other textures.  She was noted to get increasingly short of breath with intake.  Given clinical presentation, patient's known history of COPD and new thalamic stroke MBS will be completed to fully assess swallowing physiology and safety.  Suggest she remain NPO except small sips of water with diligent oral care using a toothbrush and toothpaste 2-3 times per day and meds via cortrak except those that can not be crushed. Those may be given whole in puree pending MBS.     HPI HPI: 89 year old female with prior history of adenocarcinoma of lung, breast cancer, hypertension hyperlipidemia, chronic hypoxic respiratory failure due to severe COPD on home oxygen who presented with right thalamic intraparenchymal hemorrhage with IV      SLP Plan  MBS      Recommendations for follow up therapy are one component of a  multi-disciplinary discharge planning process, led by the attending physician.  Recommendations may be updated based on patient status, additional functional criteria and insurance authorization.    Recommendations  Diet recommendations: NPO Medication Administration: Whole meds with puree Compensations: Slow rate;Small sips/bites                  Oral care QID     Dysphagia, oropharyngeal phase (R13.12)     MBS     Dimas Aguas, MA, CCC-SLP Acute Rehab SLP [phone]  Fleet Contras  07/11/2023, 9:38 AM

## 2023-07-11 NOTE — Plan of Care (Signed)
  Problem: Education: Goal: Knowledge of disease or condition will improve Outcome: Progressing Goal: Knowledge of secondary prevention will improve (MUST DOCUMENT ALL) Outcome: Progressing Goal: Knowledge of patient specific risk factors will improve Loraine Leriche N/A or DELETE if not current risk factor) Outcome: Progressing   Problem: Intracerebral Hemorrhage Tissue Perfusion: Goal: Complications of Intracerebral Hemorrhage will be minimized Outcome: Progressing   Problem: Coping: Goal: Will verbalize positive feelings about self Outcome: Progressing Goal: Will identify appropriate support needs Outcome: Progressing   Problem: Health Behavior/Discharge Planning: Goal: Ability to manage health-related needs will improve Outcome: Progressing Goal: Goals will be collaboratively established with patient/family Outcome: Progressing   Problem: Self-Care: Goal: Ability to participate in self-care as condition permits will improve Outcome: Progressing Goal: Verbalization of feelings and concerns over difficulty with self-care will improve Outcome: Progressing Goal: Ability to communicate needs accurately will improve Outcome: Progressing   Problem: Nutrition: Goal: Risk of aspiration will decrease Outcome: Progressing Goal: Dietary intake will improve Outcome: Progressing   Problem: Education: Goal: Ability to describe self-care measures that may prevent or decrease complications (Diabetes Survival Skills Education) will improve Outcome: Progressing Goal: Individualized Educational Video(s) Outcome: Progressing   Problem: Coping: Goal: Ability to adjust to condition or change in health will improve Outcome: Progressing   Problem: Fluid Volume: Goal: Ability to maintain a balanced intake and output will improve Outcome: Progressing   Problem: Health Behavior/Discharge Planning: Goal: Ability to identify and utilize available resources and services will improve Outcome:  Progressing Goal: Ability to manage health-related needs will improve Outcome: Progressing   Problem: Metabolic: Goal: Ability to maintain appropriate glucose levels will improve Outcome: Progressing   Problem: Nutritional: Goal: Maintenance of adequate nutrition will improve Outcome: Progressing Goal: Progress toward achieving an optimal weight will improve Outcome: Progressing   Problem: Skin Integrity: Goal: Risk for impaired skin integrity will decrease Outcome: Progressing   Problem: Tissue Perfusion: Goal: Adequacy of tissue perfusion will improve Outcome: Progressing   Problem: Education: Goal: Knowledge of General Education information will improve Description: Including pain rating scale, medication(s)/side effects and non-pharmacologic comfort measures Outcome: Progressing   Problem: Health Behavior/Discharge Planning: Goal: Ability to manage health-related needs will improve Outcome: Progressing   Problem: Clinical Measurements: Goal: Ability to maintain clinical measurements within normal limits will improve Outcome: Progressing Goal: Will remain free from infection Outcome: Progressing Goal: Diagnostic test results will improve Outcome: Progressing Goal: Respiratory complications will improve Outcome: Progressing Goal: Cardiovascular complication will be avoided Outcome: Progressing   Problem: Activity: Goal: Risk for activity intolerance will decrease Outcome: Progressing   Problem: Nutrition: Goal: Adequate nutrition will be maintained Outcome: Progressing   Problem: Coping: Goal: Level of anxiety will decrease Outcome: Progressing   Problem: Elimination: Goal: Will not experience complications related to bowel motility Outcome: Progressing Goal: Will not experience complications related to urinary retention Outcome: Progressing   Problem: Pain Management: Goal: General experience of comfort will improve Outcome: Progressing   Problem:  Safety: Goal: Ability to remain free from injury will improve Outcome: Progressing   Problem: Skin Integrity: Goal: Risk for impaired skin integrity will decrease Outcome: Progressing

## 2023-07-11 NOTE — Progress Notes (Addendum)
STROKE TEAM PROGRESS NOTE   BRIEF HPI [name] is a 89 y.o. female with PMNH significant for lung/breast cancer s/p radiation, COPD on 24hour oxygen at home, HTN, HLD, hypothyroidism, OA who presented 12/5 d/t left-sided weakness, left facial droop, left sensory deficit.  Imaging showed Right Thalamic ICH with IVE [id number] 12/5 mRs:1 ICH score: 2 No TNK or EVT offered due to ICH.  NIH on Admission: 49  SIGNIFICANT HOSPITAL EVENTS 12/5: Admitted due to right thalamic ICH with I'VE  12/6: CCM consulted due to acute respiratory distress noted after MRI.  DNR status per family's wishes  INTERIM HISTORY/SUBJECTIVE  Family at bedside.  Plan of care and assessment reviewed thoroughly, all questions answered.  On exam patient is sitting up in chair with some improved breathing.  Patient does fatigue easily with any activity, including talking for a few minutes at a time.  Speech noted fatigue with eating as well and has planned for a modified barium swallow test tomorrow.    We will transfer her out of ICU and to progressive care.    OBJECTIVE  CBC    Component Value Date/Time   WBC 19.7 (H) 07/11/2023 0447   RBC 2.71 (L) 07/11/2023 0447   HGB 8.3 (L) 07/11/2023 0447   HCT 26.7 (L) 07/11/2023 0447   PLT 298 07/11/2023 0447   MCV 98.5 07/11/2023 0447   MCH 30.6 07/11/2023 0447   MCHC 31.1 07/11/2023 0447   RDW 12.9 07/11/2023 0447   LYMPHSABS 0.4 (L) 07/08/2023 1518   MONOABS 0.8 07/08/2023 1518   EOSABS 0.0 07/08/2023 1518   BASOSABS 0.0 07/08/2023 1518    BMET    Component Value Date/Time   NA 141 07/11/2023 0447   K 3.8 07/11/2023 0447   CL 94 (L) 07/11/2023 0447   CO2 37 (H) 07/11/2023 0447   GLUCOSE 180 (H) 07/11/2023 0447   BUN 31 (H) 07/11/2023 0447   BUN 19.9 09/02/2015 1309   CREATININE 0.72 07/11/2023 0447   CREATININE 0.9 09/02/2015 1309   CALCIUM 9.3 07/11/2023 0447   CALCIUM 9.6 07/05/2012 1409   EGFR 61 (L) 09/02/2015 1309   GFRNONAA >60  07/11/2023 0447    IMAGING past 24 hours No results found.  Vitals:   07/11/23 0500 07/11/23 0600 07/11/23 0800 07/11/23 0856  BP: (!) 149/74 (!) 154/98 (!) 163/74   Pulse: (!) 110 (!) 105 (!) 126   Resp: (!) 27 (!) 26 (!) 34   Temp:   99.3 F (37.4 C)   TempSrc:   Oral   SpO2: 93% 92% 97% 97%  Weight:      Height:        PHYSICAL EXAM General:  Alert, chronically ill-appearing . CV: Tachycardic, irregular Respiratory: Labored breathing, on nasal cannula.  Tachypnea, Dyspnea with activity. Tripod positioning.  NEURO:  Mental Status: She is alert and oriented, gives clear history. Speech/language: No dysarthria or aphasia present.  Naming and repetition intact  Cranial Nerves:  II: PERRL.  Decreased vs inconsistent left visual field III, IV, VI: EOMI. Eyelids elevate symmetrically.  V: Sensation is intact to light touch and symmetrical to face.  VII: slight left facial droop VIII: hearing intact to voice. IX, X: Palate elevates symmetrically. Phonation is normal.  AV:[id number] shrug 5/5. XII: tongue is midline without fasciculations. Motor:  5/5 RLE/RUE with no drift.  LUE 4-/5 with drift and decreased coordination, 4-/5 grip LLE 4-/5 distal weaker than proximal  Tone: is normal and bulk is normal Sensation-  decreased sensation continues to left arm. Coordination: Unable to perform FTN or HKS testing on left side. Gait- deferred  Most Recent NIH: 7   ASSESSMENT/PLAN  ICH:  right thalamic ICH with IVH, etiology likely hypertensive due to location and risk factors.    CT head  2.4x1.7cm right thalamic hemorrhage with I'VE. Age-indeterminate infarcts in left basal ganglia CTA head & neck No evidecne of acute extravasation, no intracranial LVO Follow-up CT: Unchanged hematomas extending into right lateral ventricle MRI  Unchanged IPH with IVE 2D Echo: LVEF 60 to 65%, mild LVH, moderate aortic valve calcification, no shunt LDL 88 HgbA1c 5.6 UDS negative VTE  prophylaxis - SQ heparin  aspirin 81 mg daily prior to admission, continue No antithrombotic due to ICH Therapy recommendations:  SNF Disposition:  pending, likely SNF  Acute hypoxic respiratory distress ?  Aspiration pneumonia History of COPD Gold stage IV History of right upper lobe adenocarcinoma s/p radiation CCM consult, appreciate assistance CTA negative for PE, consistent with aspiration Aspiration precautions Continue scheduled bronchodilators Continue IV steroids  Hypertension Continue Home meds:  losartan 100mg , metoprolol 25mg , nifedipine 30mg  continued Will increase Nifedipine to 90mg  Cleviprex IV off  Labetalol IV Q2h PRN BP goal: now SBP less than 160  Hyperlipidemia Home meds:  pravastatin 20mg  LDL 88, goal < 70 Increase pravastatin to 40  Continue statin at discharge  Diabetes type II Controlled Home meds:  metformin 500mg  HgbA1c 5.6, goal < 7.0 CBGs SSI Recommend close follow-up with PCP  Cerebral aneurysms Bilateral cavernous ICA aneurysms, 14mm on left, 4mm on right. No intervention at this time given ICH Follow-up as outpatient  Dysphagia Patient has post-stroke dysphagia SLP consulted  currently n.p.o.  CoreTRak in place On tube feeding MBS tomorrow  Other Stroke Risk Factors Advanced Age Former smoker  Other Active Problems Hx of adenocarcinoma, right upper lung s/p radiation Hx of Breast Cancer Unchanged meningioma, CT and MRI head 0.8x3.2 cm parafalcine meningioma Tachycardia due to respiratory distress  Hospital day # 3   Pt seen by Neuro NP/APP and later by MD. Note/plan to be edited by MD as needed.    Charlotte January, DNP, AGACNP-BC [facility] Please use AMION for contact information & EPIC for messaging.   ATTENDING NOTE: I reviewed above note and agree with the assessment and plan. Pt was seen and examined.   Son and daughter are at bedside.  Patient sitting in chair, awake alert, orientated, answer  questions appropriately but still has short of breath and increased work of breath.  Neuro stable.  BP on the high end, increase nifedipine to 90 mg.  Continue losartan and metoprolol.  CCM on board, continue nebulization and IV steroids.  PT and OT recommend SNF.  For detailed assessment and plan, please refer to above/below as I have made changes wherever appropriate.   Marvel Plan, MD PhD Stroke Neurology 07/11/2023 5:13 PM  This patient is critically ill due to ICH, IVH, respiratory distress, hypertensive emergency and at significant risk of neurological worsening, death form hematoma expansion, brain herniation, respiratory failure, hypertensive encephalopathy. This patient's care requires constant monitoring of vital signs, hemodynamics, respiratory and cardiac monitoring, review of multiple databases, neurological assessment, discussion with family, other specialists and medical decision making of high complexity. I spent 35 minutes of neurocritical care time in the care of this patient. I had long discussion with son and daughter at bedside, updated pt current condition, treatment plan and potential prognosis, and answered all the questions.  They expressed  understanding and appreciation.

## 2023-07-11 NOTE — Evaluation (Signed)
Physical Therapy Evaluation Patient Details Name: Charlotte Harris MRN: [id number] DOB: July 29, 1932 Today's Date: 07/11/2023  History of Present Illness  Pt is an 89 y.o. female who presented 07/08/23 s/p fall and with L-sided weakness. Pt found to have right thalamic and frontal white matter  intraparenchymal hematomas with intraventricular extension. Hospitalization complicated by acute hypoxic respiratory failure secondary to aspiration pneumonia. PMH: adenocarcinoma of lung, breast cancer, COPD, HLD, HTN, hypothyroidism, OA, osteoporosis   Clinical Impression  Pt presents with condition above and deficits mentioned below, see PT Problem List. PTA, she was mod I for functional mobility, holding onto furniture for support. She is only able to ambulate short household distances due to endurance deficits at baseline. It takes her x2 hours to get ready in the morning due to fatiguing quickly and needing to rest often. Pt lives alone in a 1-level house with a level entrance. She has a PCA come MWF from 10 AM - 2 PM and a friend checks on her 1x/week. Pt and her son and daughter report she does not have 24/7 care available to her. Currently, pt is requiring modA for bed mobility, sit to stand transfers, and stand pivot transfers with HHA. She was unable to tolerate gait trials due to endurance deficits this date. She is currently demonstrating deficits in L upper and lower extremity strength, sensation, and coordination (UE > LE) along with deficits in balance and endurance. She either has decreased insight into her deficits or is in denial for her need for more continuous assistance at this time as pt really wants to return home. Discussed her risk for falls and current need for increased and frequent assistance and that a LTC facility may be a good option. At this time, recommending short-term inpatient rehab, < 3 hours/day, to address her deficits and maximize her return to baseline. Will continue to follow  acutely.      If plan is discharge home, recommend the following: Two people to help with walking and/or transfers;A lot of help with bathing/dressing/bathroom;Assistance with cooking/housework;Direct supervision/assist for medications management;Direct supervision/assist for financial management;Assist for transportation   Can travel by private vehicle   No    Equipment Recommendations BSC/3in1;Wheelchair (measurements PT);Wheelchair cushion (measurements PT);Hospital bed  Recommendations for Other Services       Functional Status Assessment Patient has had a recent decline in their functional status and demonstrates the ability to make significant improvements in function in a reasonable and predictable amount of time.     Precautions / Restrictions Precautions Precautions: Fall;Other (comment) Precaution Comments: watch SpO2 & RR (on 4L baseline); Goal SBP < 160 Restrictions Weight Bearing Restrictions: No      Mobility  Bed Mobility Overal bed mobility: Needs Assistance Bed Mobility: Supine to Sit     Supine to sit: Mod assist, HOB elevated, Used rails     General bed mobility comments: ModA needed to guide L leg off EOB and ascend her trunk while scooting her hips to EOB. Pt leans to the L once sitting EOB and needs assistance for balance.    Transfers Overall transfer level: Needs assistance Equipment used: 1 person hand held assist Transfers: Sit to/from Stand, Bed to chair/wheelchair/BSC Sit to Stand: Mod assist Stand pivot transfers: Mod assist         General transfer comment: Pt cued to hold onto therapist anterior to her to stand from EOB, modA to power up and gain balance. ModA needed to guide her hips to the L towards the chair  to transfer to the chair from the bed with HHA. No knee buckling noted    Ambulation/Gait               General Gait Details: deferred, unable to tolerate this date due to poor endurance  Stairs             Wheelchair Mobility     Tilt Bed    Modified Rankin (Stroke Patients Only) Modified Rankin (Stroke Patients Only) Pre-Morbid Rankin Score: Slight disability Modified Rankin: Severe disability     Balance Overall balance assessment: Needs assistance Sitting-balance support: Single extremity supported, Bilateral upper extremity supported, Feet supported Sitting balance-Leahy Scale: Poor Sitting balance - Comments: Pt leans to the L, needing min-modA for static sitting balance and cuing to lean to the R Postural control: Left lateral lean Standing balance support: Bilateral upper extremity supported, During functional activity Standing balance-Leahy Scale: Poor Standing balance comment: Reliant on UE support and modA                             Pertinent Vitals/Pain Pain Assessment Pain Assessment: Faces Faces Pain Scale: Hurts little more Pain Location: generalized discomfort with increased work of breathing Pain Descriptors / Indicators: Discomfort Pain Intervention(s): Limited activity within patient's tolerance, Monitored during session, Repositioned    Home Living Family/patient expects to be discharged to:: Private residence Living Arrangements: Alone Available Help at Discharge: Personal care attendant;Available PRN/intermittently;Friend(s) (PCA MWF from 10 AM - 2 PM; friend checks on her 1x/week) Type of Home: House Home Access: Level entry       Home Layout: One level Home Equipment: Agricultural consultant (2 wheels) Additional Comments: 4L O2 baseline    Prior Function Prior Level of Function : Needs assist             Mobility Comments: Mod I furniture surfing with a shuffling gait pattern, only tolerating short household distances before she fatigues and needs to rest ADLs Comments: PCA does some cooking, but pt does use the microwave and intermittently the oven to warm up food; she has someone come clean the house so she does not do the cleaning;  does not leave the house, and does not drive; manages her own meds and finances; takes her ~2 hours to get ready in the morning     Extremity/Trunk Assessment   Upper Extremity Assessment Upper Extremity Assessment: Defer to OT evaluation    Lower Extremity Assessment Lower Extremity Assessment: LLE deficits/detail LLE Deficits / Details: MMT scores of 3 to 3+ grossly; reports numbness to touch LLE Sensation: decreased light touch LLE Coordination: decreased gross motor    Cervical / Trunk Assessment Cervical / Trunk Assessment: Kyphotic  Communication   Communication Communication: No apparent difficulties  Cognition Arousal: Alert Behavior During Therapy: WFL for tasks assessed/performed Overall Cognitive Status: Impaired/Different from baseline Area of Impairment: Safety/judgement                         Safety/Judgement: Decreased awareness of deficits     General Comments: Potentially poor insight into extent of her deficits. Pt may be at baseline and just wanting to deny she needs assistance as pt desires to return home, but pt stated "I think that went pretty well" after the session but then agreed that she needed a lot of assistance from the PT        General Comments General comments (skin integrity, edema,  etc.): BP stable with SBP < 160 throughout; educated pt and her son and daughter on elevating her L UE at rest, exercises they can perform for both L UE and LE (shoulder flexion < 90') to increase strength, and importance of mobility and sitting up for pulmonary function - they verbalized understanding; began to discuss that pt may benefit from LTC    Exercises     Assessment/Plan    PT Assessment Patient needs continued PT services  PT Problem List Decreased strength;Decreased activity tolerance;Decreased balance;Decreased mobility;Decreased cognition;Decreased coordination;Decreased safety awareness;Cardiopulmonary status limiting activity;Impaired  sensation       PT Treatment Interventions DME instruction;Gait training;Functional mobility training;Therapeutic activities;Therapeutic exercise;Balance training;Neuromuscular re-education;Cognitive remediation;Patient/family education    PT Goals (Current goals can be found in the Care Plan section)  Acute Rehab PT Goals Patient Stated Goal: to return home PT Goal Formulation: With patient/family Time For Goal Achievement: 07/25/23 Potential to Achieve Goals: Good    Frequency Min 1X/week     Co-evaluation               AM-PAC PT "6 Clicks" Mobility  Outcome Measure Help needed turning from your back to your side while in a flat bed without using bedrails?: A Little Help needed moving from lying on your back to sitting on the side of a flat bed without using bedrails?: A Lot Help needed moving to and from a bed to a chair (including a wheelchair)?: A Lot Help needed standing up from a chair using your arms (e.g., wheelchair or bedside chair)?: A Lot Help needed to walk in hospital room?: Total Help needed climbing 3-5 steps with a railing? : Total 6 Click Score: 11    End of Session Equipment Utilized During Treatment: Gait belt;Oxygen Activity Tolerance: Patient limited by fatigue Patient left: in chair;with call bell/phone within reach;with chair alarm set;with family/visitor present Nurse Communication: Mobility status;Need for lift equipment (stedy) PT Visit Diagnosis: Unsteadiness on feet (R26.81);Other abnormalities of gait and mobility (R26.89);Muscle weakness (generalized) (M62.81);Difficulty in walking, not elsewhere classified (R26.2);Other symptoms and signs involving the nervous system (R29.898);Hemiplegia and hemiparesis Hemiplegia - Right/Left: Left Hemiplegia - caused by: Other Nontraumatic intracranial hemorrhage    Time: 8469-6295 PT Time Calculation (min) (ACUTE ONLY): 33 min   Charges:   PT Evaluation $PT Eval Moderate Complexity: 1 Mod PT  Treatments $Therapeutic Activity: 8-22 mins PT General Charges $$ ACUTE PT VISIT: 1 Visit         Virgil Benedict, PT, DPT Acute Rehabilitation Services  Office: [phone]   Bettina Gavia 07/11/2023, 2:20 PM

## 2023-07-11 NOTE — Progress Notes (Signed)
NAME:  Charlotte Harris, MRN:  [id number], DOB:  07/12/1932, LOS: 3 ADMISSION DATE:  07/08/2023, CONSULTATION DATE:  07/09/2023 REFERRING MD: Stroke team, CHIEF COMPLAINT: Acute respiratory distress  History of Present Illness:  Charlotte Harris is a 89 year old female with an extensive past medical history significant for but not limited to severe COPD, right upper lobe lung cancer diagnosed 2011, breast cancer diagnosed 1991, hyperlipidemia, hypertension, and hypothyroidism to the ED 12/6 with left-sided weakness, left facial droop, and left sensory deficits concerning for acute stroke.  Last known normal was around 2230 night prior to admission.  CT head on admission positive for right thymic hemorrhage with IVH and age-indeterminate infarct to the left basal ganglia.  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  Patient was admitted per stroke team.  MRI brain obtained and revealed unchanged right thymic and frontal white matter intraparenchymal hematomas with IVH extension.  After completion of MRI patient was seen in significant respiratory distress with hypoxia.  Concern for aspiration event during MRI.  CCM consulted for further assistance in management.  Pertinent  Medical History  severe COPD, right upper lobe lung cancer diagnosed 2011, breast cancer diagnosed 1991, hyperlipidemia, hypertension, and hypothyroidism   Significant Hospital Events: Including procedures, antibiotic start and stop dates in addition to other pertinent events   12/06 admitted as a code stroke found to have thymic IVH.  Respiratory distress noted after MRI 12/6 intermittent episodes of tachypnea and persistent tachycardia  Interim History / Subjective:   Out of bed to chair with PT Feels that nebs may be too strong. Son and daughter at bedside Remains tachycardic  Objective   Blood pressure (!) 163/74, pulse (!) 126, temperature 99.3  F (37.4 C), temperature source Oral, resp. rate (!) 34, height 5\' 5"  (1.651 m), weight 33.7 kg, SpO2 97%.        Intake/Output Summary (Last 24 hours) at 07/11/2023 1040 Last data filed at 07/11/2023 8295 Gross per 24 hour  Intake 723.58 ml  Output 1100 ml  Net -376.42 ml   Filed Weights   07/08/23 1447 07/08/23 1641 07/09/23 1000  Weight: 47.2 kg 27.1 kg 33.7 kg    Examination: General: Acute on chronic ill-appearing deconditioned frail elderly female in chair in no acute distress HEENT: [place]/AT, MM pink/moist, PERRL,  Neuro: Alert and oriented x 3, nonfocal globally weak CV: s1s2 tacky, no murmur PULM: Decreased breath sounds bilateral, mild accessory muscle use, able to speak in full sentences GI: soft, bowel sounds active in all 4 quadrants, non-tender, non-distended Extremities: warm/dry, no edema  Skin: no rashes or lesions  Labs show normal electrolytes, increase leukocytosis  Resolved Hospital Problem list     Assessment & Plan:  Right thalamic ICH -MRI brain 12/6 stable  Bilateral cavernous ICA aneurysms Echo with normal systolic and diastolic function P: Primary management per stroke team Secondary stroke prevention Frequent neurochecks PT/OT/SLP  Acute hypoxic respiratory failure secondary to aspiration pneumonia -CTA obtained and negative for PE but consistent with likely aspiration event Severe COPD Gold stage IV -Postbronchodilator FEV1 20% predicted History of right upper lobe adenocarcinoma s/p radiation P: Supplemental oxygen for sat goal greater than 90 Scheduled budesonide, will DC Brovana due to tachycardia and palpitations, continue DuoNebs as needed Add hypertonic nebs And chest PT Nasal spray Decrease IV Solu-Medrol to 20 daily Aspiration precautions  Sinus tachycardia -Likely multifactorial including acute respiratory distress and possible rebound tachycardia given missed  p.o. beta-blocker Essential hypertension Hyperlipidemia -Home  medications include aspirin, Cozaar, Toprol-XL, and pravastatin P: Home medications resumed  Continuous telemetry   Type 2 diabetes -Home medication includes metformin P: SSI CBG goal 140-180 CBG checks every 4  Best Practice (right click and "Reselect all SmartList Selections" daily)   Diet/type: NPO DVT prophylaxis: heparin injection 5,000 Units Start: 07/09/23 1000 SCD's Start: 07/08/23 1552  Pressure ulcer(s): not present on admission  GI prophylaxis: PPI Lines: N/A Foley:  N/A Code Status:  DNR Last date of multidisciplinary goals of care discussion: Updated family at bedside 12/8, patient would be agreeable to short-term rehab, long-term needs TBD   Cyril Mourning MD. Tonny Bollman. [facility] Pager : [phone]  If no response to pager , please call [phone] until 7 pm After 7:00 pm call Elink  [phone]    07/11/2023, 10:40 AM

## 2023-07-12 ENCOUNTER — Inpatient Hospital Stay (HOSPITAL_COMMUNITY): Payer: Medicare Other

## 2023-07-12 ENCOUNTER — Telehealth: Payer: Self-pay | Admitting: Physician Assistant

## 2023-07-12 DIAGNOSIS — J9601 Acute respiratory failure with hypoxia: Secondary | ICD-10-CM | POA: Diagnosis not present

## 2023-07-12 DIAGNOSIS — J9621 Acute and chronic respiratory failure with hypoxia: Secondary | ICD-10-CM

## 2023-07-12 DIAGNOSIS — I610 Nontraumatic intracerebral hemorrhage in hemisphere, subcortical: Secondary | ICD-10-CM | POA: Diagnosis not present

## 2023-07-12 LAB — BASIC METABOLIC PANEL
Anion gap: 8 (ref 5–15)
BUN: 34 mg/dL — ABNORMAL HIGH (ref 8–23)
CO2: 39 mmol/L — ABNORMAL HIGH (ref 22–32)
Calcium: 9.2 mg/dL (ref 8.9–10.3)
Chloride: 96 mmol/L — ABNORMAL LOW (ref 98–111)
Creatinine, Ser: 0.64 mg/dL (ref 0.44–1.00)
GFR, Estimated: >60 mL/min (ref >60)
Glucose, Bld: 119 mg/dL — ABNORMAL HIGH (ref 70–99)
Potassium: 3.8 mmol/L (ref 3.5–5.1)
Sodium: 143 mmol/L (ref 135–145)

## 2023-07-12 LAB — CBC
HCT: 28.7 % — ABNORMAL LOW (ref 36.0–46.0)
Hemoglobin: 8.9 g/dL — ABNORMAL LOW (ref 12.0–15.0)
MCH: 31.1 pg (ref 26.0–34.0)
MCHC: 31 g/dL (ref 30.0–36.0)
MCV: 100.3 fL — ABNORMAL HIGH (ref 80.0–100.0)
Platelets: 316 10*3/uL (ref 150–400)
RBC: 2.86 MIL/uL — ABNORMAL LOW (ref 3.87–5.11)
RDW: 12.9 % (ref 11.5–15.5)
WBC: 17.8 10*3/uL — ABNORMAL HIGH (ref 4.0–10.5)
nRBC: 0.1 % (ref 0.0–0.2)

## 2023-07-12 LAB — GLUCOSE, CAPILLARY
Glucose-Capillary: 121 mg/dL — ABNORMAL HIGH (ref 70–99)
Glucose-Capillary: 128 mg/dL — ABNORMAL HIGH (ref 70–99)
Glucose-Capillary: 130 mg/dL — ABNORMAL HIGH (ref 70–99)
Glucose-Capillary: 155 mg/dL — ABNORMAL HIGH (ref 70–99)
Glucose-Capillary: 164 mg/dL — ABNORMAL HIGH (ref 70–99)
Glucose-Capillary: 215 mg/dL — ABNORMAL HIGH (ref 70–99)
Glucose-Capillary: 237 mg/dL — ABNORMAL HIGH (ref 70–99)

## 2023-07-12 MED ORDER — LOPERAMIDE HCL 1 MG/7.5ML PO SUSP
2.0000 mg | ORAL | Status: DC | PRN
  Administered 2023-07-12: 2 mg
  Filled 2023-07-12: qty 15

## 2023-07-12 MED ORDER — METOPROLOL TARTRATE 5 MG/5ML IV SOLN
2.5000 mg | INTRAVENOUS | Status: DC | PRN
  Administered 2023-07-12: 5 mg via INTRAVENOUS
  Filled 2023-07-12: qty 5

## 2023-07-12 MED ORDER — LOPERAMIDE HCL 1 MG/7.5ML PO SUSP
4.0000 mg | Freq: Once | ORAL | Status: AC
  Administered 2023-07-12: 4 mg
  Filled 2023-07-12: qty 30

## 2023-07-12 NOTE — Progress Notes (Signed)
New transfer from the ICU for stroke. AxOx4, 1L [place],cotrack, daughter at bedside. MEWS yellow for HR and BP. Charge and receiving RN made aware.    07/12/23 1847  Vitals  Temp 99.6 F (37.6 C)  Temp Source Oral  BP (!) 161/99  MAP (mmHg) 118  BP Location Left Arm  BP Method Automatic  Patient Position (if appropriate) Lying  Pulse Rate (!) 107  Pulse Rate Source Dinamap  ECG Heart Rate (!) 109  Level of Consciousness  Level of Consciousness Alert  MEWS COLOR  MEWS Score Color Yellow  Oxygen Therapy  SpO2 100 %  O2 Device Nasal Cannula  O2 Flow Rate (L/min) 1 L/min  Pain Assessment  Pain Scale 0-10  Pain Score 0  PCA/Epidural/Spinal Assessment  Respiratory Pattern Regular;Unlabored  Glasgow Coma Scale  Eye Opening 4  Best Verbal Response (NON-intubated) 5  Best Motor Response 6  Glasgow Coma Scale Score 15  MEWS Score  MEWS Temp 0  MEWS Systolic 0  MEWS Pulse 1  MEWS RR 2  MEWS LOC 0  MEWS Score 3

## 2023-07-12 NOTE — Progress Notes (Signed)
Physical Therapy Treatment Patient Details Name: Charlotte Harris MRN: [id number] DOB: 09-15-1931 Today's Date: 07/12/2023   History of Present Illness Pt is an 89 y.o. female who presented 07/08/23 s/p fall and with L-sided weakness. Pt found to have right thalamic and frontal white matter  intraparenchymal hematomas with intraventricular extension. Hospitalization complicated by acute hypoxic respiratory failure secondary to aspiration pneumonia. PMH: adenocarcinoma of lung, breast cancer, COPD, HLD, HTN, hypothyroidism, OA, osteoporosis    PT Comments  Pt progressing slow and steadily, limited by resp failure due to PNA and general weakness.  Emphasis on warm up, transitions to EOB with more assist for scooting with max assist, sit to stand and transfers with degrees of moderate assist.    If plan is discharge home, recommend the following: Two people to help with walking and/or transfers;A lot of help with bathing/dressing/bathroom;Assistance with cooking/housework;Direct supervision/assist for medications management;Direct supervision/assist for financial management;Assist for transportation   Can travel by private vehicle     No  Equipment Recommendations  BSC/3in1;Wheelchair (measurements PT);Wheelchair cushion (measurements PT);Hospital bed    Recommendations for Other Services       Precautions / Restrictions Precautions Precautions: Fall;Other (comment) Precaution Comments: watch SpO2 & RR (on 4L baseline); Goal SBP < 160 Restrictions Weight Bearing Restrictions: No     Mobility  Bed Mobility Overal bed mobility: Needs Assistance Bed Mobility: Rolling, Sidelying to Sit Rolling: Mod assist Sidelying to sit: Max assist       General bed mobility comments: cues for best technique.  mode assist overall to hooklying, rolling and coming up via L elbow and max assist and pt assisting with R UE.    Transfers Overall transfer level: Needs assistance Equipment used: 1  person hand held assist Transfers: Sit to/from Stand, Bed to chair/wheelchair/BSC Sit to Stand: Mod assist Stand pivot transfers: Mod assist (x2 bed to [facility] to recliner)         General transfer comment: face to face assist for standing and 2 transfers with support for weak knees and truncal support for w/shift and pivot.    Ambulation/Gait               General Gait Details: unable this date   Stairs             Wheelchair Mobility     Tilt Bed    Modified Rankin (Stroke Patients Only) Modified Rankin (Stroke Patients Only) Pre-Morbid Rankin Score: Slight disability Modified Rankin: Severe disability     Balance Overall balance assessment: Needs assistance Sitting-balance support: Feet unsupported, Bilateral upper extremity supported Sitting balance-Leahy Scale: Poor Sitting balance - Comments: moderately heavy list to the left.  pt needed external support or propping on L side. Postural control: Left lateral lean Standing balance support: Bilateral upper extremity supported, During functional activity Standing balance-Leahy Scale: Poor Standing balance comment: Reliant on UE support and modA                            Cognition Arousal: Alert Behavior During Therapy: WFL for tasks assessed/performed Overall Cognitive Status:  (NT formally)                                          Exercises Other Exercises Other Exercises: hip/knee flex/ext with graded assist/resistance x 5-10 reps Other Exercises: bicep tricep presses x5 reps with graded  resistance, limited by  respiratory status    General Comments General comments (skin integrity, edema, etc.): 4L O2 Charlotte Harris SpO2 97%, HR tachy @upper  110's to 130's.  Pt's breathing became labored with each activity at 40's RPM with lots of accessory musculatrue      Pertinent Vitals/Pain Pain Assessment Pain Assessment: Faces Faces Pain Scale: Hurts little more Pain Location:  generalized discomfort with increased work of breathing Pain Descriptors / Indicators: Discomfort Pain Intervention(s): Monitored during session    Home Living                          Prior Function            PT Goals (current goals can now be found in the care plan section) Acute Rehab PT Goals Patient Stated Goal: to return home PT Goal Formulation: With patient/family Time For Goal Achievement: 07/25/23 Potential to Achieve Goals: Good Progress towards PT goals: Progressing toward goals    Frequency    Min 1X/week      PT Plan      Co-evaluation              AM-PAC PT "6 Clicks" Mobility   Outcome Measure  Help needed turning from your back to your side while in a flat bed without using bedrails?: A Little Help needed moving from lying on your back to sitting on the side of a flat bed without using bedrails?: A Lot Help needed moving to and from a bed to a chair (including a wheelchair)?: A Lot Help needed standing up from a chair using your arms (e.g., wheelchair or bedside chair)?: A Lot Help needed to walk in hospital room?: Total Help needed climbing 3-5 steps with a railing? : Total 6 Click Score: 11    End of Session   Activity Tolerance: Patient limited by fatigue Patient left: in chair;with call bell/phone within reach;with chair alarm set;with family/visitor present Nurse Communication: Mobility status PT Visit Diagnosis: Other abnormalities of gait and mobility (R26.89);Muscle weakness (generalized) (M62.81);Other symptoms and signs involving the nervous system (R29.898) Hemiplegia - Right/Left: Left Hemiplegia - caused by: Other Nontraumatic intracranial hemorrhage     Time: 7846-9629 PT Time Calculation (min) (ACUTE ONLY): 38 min  Charges:    $Therapeutic Exercise: 8-22 mins $Therapeutic Activity: 8-22 mins $Neuromuscular Re-education: 8-22 mins PT General Charges $$ ACUTE PT VISIT: 1 Visit                      07/12/2023  Jacinto Halim., PT Acute Rehabilitation Services [phone]  (office)   [name] 07/12/2023, 5:38 PM

## 2023-07-12 NOTE — Progress Notes (Signed)
NAME:  Charlotte Harris, MRN:  [id number], DOB:  1932/06/23, LOS: 4 ADMISSION DATE:  07/08/2023, CONSULTATION DATE:  07/09/2023 REFERRING MD: Stroke team, CHIEF COMPLAINT: Acute respiratory distress  History of Present Illness:  Charlotte Harris is a 89 year old female with an extensive past medical history significant for but not limited to severe COPD, right upper lobe lung cancer diagnosed 2011, breast cancer diagnosed 1991, hyperlipidemia, hypertension, and hypothyroidism to the ED 12/6 with left-sided weakness, left facial droop, and left sensory deficits concerning for acute stroke.  Last known normal was around 2230 night prior to admission.  CT head on admission positive for right thymic hemorrhage with IVH and age-indeterminate infarct to the left basal ganglia.  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  Patient was admitted per stroke team.  MRI brain obtained and revealed unchanged right thymic and frontal white matter intraparenchymal hematomas with IVH extension.  After completion of MRI patient was seen in significant respiratory distress with hypoxia.  Concern for aspiration event during MRI.  CCM consulted for further assistance in management.  Pertinent  Medical History  severe COPD, right upper lobe lung cancer diagnosed 2011, breast cancer diagnosed 1991, hyperlipidemia, hypertension, and hypothyroidism   Significant Hospital Events: Including procedures, antibiotic start and stop dates in addition to other pertinent events   12/06 admitted as a code stroke found to have thymic IVH.  Respiratory distress noted after MRI 12/6 intermittent episodes of tachypnea and persistent tachycardia  Interim History / Subjective:  Feels a lot better. Palpitations improved. Has a bit of cough, occasionally feels she needs to loosen some phlegm and get it up but not consistent. Down to 2L O2 via [place] with sats  98%.  Objective   Blood pressure (!) 144/73, pulse (!) 108, temperature 97.9 F (36.6 C), temperature source Axillary, resp. rate (!) 22, height 5\' 5"  (1.651 m), weight 30.7 kg, SpO2 91%.        Intake/Output Summary (Last 24 hours) at 07/12/2023 0826 Last data filed at 07/12/2023 0700 Gross per 24 hour  Intake 905.06 ml  Output 600 ml  Net 305.06 ml   Filed Weights   07/08/23 1641 07/09/23 1000 07/12/23 0500  Weight: 27.1 kg 33.7 kg 30.7 kg    Examination: General: Acute on chronic ill-appearing deconditioned frail elderly female, sitting up in chair, in NAD HEENT: [place]/AT, MM pink/moist, PERRL,  Neuro: Alert and oriented x 3, nonfocal but  globally weak CV: RRR, no M/R/G PULM: Decreased breath sounds bilateral, able to speak in full sentences GI: soft, bowel sounds active in all 4 quadrants, non-tender, non-distended Extremities: warm/dry, no edema  Skin: no rashes or lesions   Assessment & Plan:   Right thalamic ICH -MRI brain 12/6 stable  Bilateral cavernous ICA aneurysms Echo with normal systolic and diastolic function P: Primary management per stroke team Secondary stroke prevention Frequent neurochecks PT/OT/SLP  Acute hypoxic respiratory failure secondary to aspiration pneumonia -CTA obtained and negative for PE but consistent with likely aspiration event Severe COPD Gold stage IV -Postbronchodilator FEV1 20% predicted History of right upper lobe adenocarcinoma s/p radiation P: Supplemental oxygen for sat goal greater than 90, decreased to 2L this AM (baseline 2-3L at home) Scheduled budesonide, PRN DuoNebs Continue to hold Brovana due to tachycardia and palpitations Continue hypertonic nebs, chest PT Add flutter valve Nasal spray Decrease IV Solu-Medrol to 20 daily 2 more days Aspiration precautions Mobilize  Sinus tachycardia -Likely  multifactorial including acute respiratory distress and possible rebound tachycardia given missed p.o.  beta-blocker Essential hypertension Hyperlipidemia -Home medications include aspirin, Cozaar, Toprol-XL, and pravastatin P: Home medications resumed  Continuous telemetry  Type 2 diabetes -Home medication includes metformin P: SSI CBG goal 140-180 CBG checks every 4  Transferring to progressive today. Doing well from pulmonary standpoint. We will arrange outpatient follow up for her in our office in the next month. Please call with questions in interim while she remains hospitalized.   Best Practice (right click and "Reselect all SmartList Selections" daily)   Diet/type: NPO. MBS pending DVT prophylaxis: heparin injection 5,000 Units Start: 07/09/23 1000 SCD's Start: 07/08/23 1552  Pressure ulcer(s): not present on admission  GI prophylaxis: PPI Lines: N/A Foley:  N/A Code Status:  DNR Last date of multidisciplinary goals of care discussion: Updated family at bedside 12/8, patient would be agreeable to short-term rehab, long-term needs TBD   Rutherford Guys, PA - [facility] For pager details, please see AMION or use Epic chat  After 1900, please call ELINK for cross coverage needs 07/12/2023, 8:35 AM

## 2023-07-12 NOTE — Telephone Encounter (Signed)
LVMTCB to schedule hospital follow up.

## 2023-07-12 NOTE — TOC Initial Note (Addendum)
Transition of Care [facility]) - Initial/Assessment Note    Patient Details  Name: Charlotte Harris MRN: [id number] Date of Birth: 03-26-1932  Transition of Care [facility]) CM/SW Contact:    Mearl Latin, LCSW Phone Number: 07/12/2023, 12:38 PM  Clinical Narrative:                 12:38pm-CSW received consult for possible SNF placement at time of discharge. CSW spoke with patient and daughter at bedside. With patient's permission, Daughter spoke with CSW outside room. She shared that patient is hopeful to return home with her aide coming in MWF from 12a-2p but daughter shared that she will not likely be able to return home alone. Her aide, Rueben Bash, works for [employer] and has put daughter in Aeronautical engineer with [facility]. In admissions. CSW left Katie a voicemail to see if they would consider patient as they typically do not accept patients from the hospital. Patient has a long term care policy through [employer] and daughter stated they can pay money up front as needed. CSW will send out referrals for review once cortrak is removed and provide alternative bed offers as available.   3:45 PM-CSW spoke with Florentina Addison at [facility] who confirmed she had spoken with patient's daughter and she is sending the requested info. She is not sure if they will have a bed at [facility] so is also going to loop in [place] as well. They will need to review patient's info but requested CSW hold off until cortrak is removed. Will continue to follow.   Skilled Nursing Rehab Facilities-   [email or web address]   Ratings out of 5 stars (5 the highest)   Name Address  Phone # Quality Care Staffing Health Inspection Overall  [facility] [address] [phone] 2 2 5 5   [facility] [address] [place] [phone] 4 2 4 4   [facility] [address], [place] [phone] 2 1 2 1   [facility] [address], [place] [phone] 3 1 4 3   [facility]  [address], [place] [phone] 4 4 5 5   [facility] [address], [facility] [phone] 3 2 2 2   [facility] [address], [place] [phone] 5 1 2 2   [facility] [phone] [address], [place] [phone] 1 1 3 1   [address] ([place]) [address] [place] [phone] 2 2 2 2   [facility] [address] [place], [place] [phone] 2 2 1 1   [facility] [address], [place] [phone] 3 1 1 1   [facility]igha Bridegroom [address] [place] [phone] 3 3 4 4   [facility] [address] [place] [phone] 2 2 3 3           [facility] [address] [place] [phone] 4 2 1 1   [address], [place] [address], [place] [phone] 1 1 2 1   [facility] [address], [place] [phone] 2 1 4 3   [facility] [address], [place] [phone] 2 1 4 3   [facility] [address] [place] [phone] 2 3 3 3           [address] (no [facility]) [address], [place] [phone] 4 5 5 5   [facility] (No [employer]) [address] [place], [place] [phone] 1 2 4 3   [facility] [address], [place] [phone] 2 1 2 1   [facility] (  No [employer]) [address], [place] [phone] 4 1 5 4   [facility] [address] [facility] [phone] 3 4 2 2   [facility] [address] [place] [phone] 2 1 1 1   [facility] [address] [place] [phone] 4 2 5 5   [facility]  [address] [place] [phone] 2 2 3 3   [facility] [address] [place] [phone] 4 1 1 1   [facility] [address] [place], [place] [phone] 2 2 3 3           [facility] [address], [place] [phone] 2 1 1 1   [facility] [address] [place]  [phone] 3 3 3 3   [facility] (No Humana) [address], [place]  [phone] 2 2 4 4   [facility]) [address], [place] [phone] 2 1 1 1   [facility] [address] [place] [phone] 4 3 5 5   [facility] [address], [place] [phone] 1 1 1 1           [facility] [address] [place], [place] [phone] 5 4 5 5   [facility])  [address] [place] [phone] 1 1 2 1   [facility]) [address] [place] [phone]  2 4 4   [facility] [address], [place] [phone] 3 5 5 5   [address] [place], [place] [phone] 4 2 2 2   [facility]) [address] [place] [phone] 2 1 3 2      Expected Discharge Plan: Skilled Nursing Facility Barriers to Discharge: Continued Medical Work up, SNF Pending bed offer, Insurance Authorization   Patient Goals and CMS Choice Patient states their goals for this hospitalization and ongoing recovery are:: Rehab CMS Medicare.gov Compare Post Acute Care list provided to:: Patient Represenative (must comment) Choice offered to / list presented to : Adult Children [place] ownership interest in [facility]provided to:: Adult Children    Expected Discharge Plan and Services In-house Referral: Clinical Social Work   Post Acute Care Choice: Skilled Nursing Facility Living arrangements for the past 2 months: Apartment                                      Prior Living Arrangements/Services Living arrangements for the past 2 months: Apartment Lives with:: Self Patient language and need for interpreter reviewed:: Yes Do you feel safe going back to the place where you live?: Yes      Need for Family Participation in Patient Care: Yes (Comment) Care giver support system in place?: Yes (comment) Current home services: DME, Homehealth aide Criminal Activity/Legal Involvement Pertinent to Current Situation/Hospitalization: No -  Comment as needed  Activities of Daily Living   ADL Screening (condition at time of admission) Independently performs ADLs?: Yes (appropriate for developmental age) Is the patient deaf or have difficulty hearing?: No Does the patient have difficulty seeing, even when wearing glasses/contacts?: No Does the patient have difficulty concentrating, remembering, or making decisions?: No  Permission Sought/Granted Permission sought to share information with : Facility Medical sales representative, Family Supports Permission granted to share information with : Yes, Verbal Permission Granted  Share Information with NAME: Celie  Permission granted to share info w AGENCY: SNFs  Permission granted to share info w Relationship: Daughter  Permission granted to share info w  Contact Information: [phone]  Emotional Assessment Appearance:: Appears stated age Attitude/Demeanor/Rapport: Engaged Affect (typically observed): Accepting, Pleasant Orientation: : Oriented to Self, Oriented to Place, Oriented to  Time, Oriented to Situation Alcohol / Substance Use: Not Applicable Psych Involvement: No (comment)  Admission diagnosis:  ICH (intracerebral hemorrhage) (HCC) [I61.9] Patient Active Problem List   Diagnosis Date Noted   Acute on chronic hypoxic respiratory failure (HCC) 07/12/2023   Aspiration into airway 07/10/2023   Pressure injury of skin 07/10/2023   Protein-calorie malnutrition, severe 07/09/2023   ICH (intracerebral hemorrhage) (HCC) 07/08/2023   Iron deficiency anemia 05/28/2023   Underweight due to inadequate caloric intake 05/15/2022   DNR (do not resuscitate) 08/11/2018   Solitary pulmonary nodule 07/29/2018   Hypoxemia    Lung mass    Normocytic normochromic anemia 07/19/2018   Type II diabetes mellitus with complication (HCC) 07/19/2018   Mass of upper lobe of left lung    Acute respiratory failure with hypoxia (HCC) 07/18/2018   Weight loss 07/21/2017   Hip pain, right  01/25/2017   Vitamin D deficiency 07/16/2016   Side effects of treatment 11/15/2014   Diabetes (HCC) 07/10/2012   Malignant neoplasm of right upper lobe of lung (HCC) 03/05/2012   Breast cancer (HCC)    History of radiation therapy    Cancer (HCC)    Encounter for long-term (current) use of other medications 07/06/2011   Malignant neoplasm of bronchus and lung (HCC) 07/03/2010   TOBACCO USE, QUIT 07/03/2010   Osteoporosis 07/02/2009   ASYMPTOMATIC POSTMENOPAUSAL STATUS 07/02/2009   PERIPHERAL EDEMA 02/15/2009   Hypothyroidism following radioiodine therapy 06/07/2008   COPD without exacerbation (HCC) 05/17/2008   Mixed hyperlipidemia 04/16/2007   HTN (hypertension) 04/16/2007   BREAST CANCER, HX OF 04/16/2007   History of colonic polyps 04/16/2007   HEMATURIA, HX OF 04/16/2007   PCP:  Karie Georges, MD Pharmacy:   [id number] [place], [place] - [address] [place] [address] Phone: [phone] Fax: [phone]     Social Determinants of Health (SDOH) Social History: SDOH Screenings   Food Insecurity: No Food Insecurity (07/10/2023)  Housing: Patient Declined (07/10/2023)  Transportation Needs: No Transportation Needs (07/10/2023)  Utilities: Not At Risk (07/10/2023)  Alcohol Screen: Low Risk  (03/15/2020)  Depression (PHQ2-9): Low Risk  (05/28/2023)  Financial Resource Strain: Low Risk  (05/10/2022)  Physical Activity: Unknown (05/10/2022)  Recent Concern: Physical Activity - Inactive (05/10/2022)  Social Connections: Unknown (05/10/2022)  Stress: No Stress Concern Present (05/10/2022)  Tobacco Use: Medium Risk (07/08/2023)   SDOH Interventions:     Readmission Risk Interventions     No data to display

## 2023-07-12 NOTE — Progress Notes (Signed)
Modified Barium Swallow Study  Patient Details  Name: Charlotte Harris MRN: [id number] Date of Birth: 1931-12-03  Today's Date: 07/12/2023  Modified Barium Swallow completed.  Full report located under Chart Review in the Imaging Section.  History of Present Illness 89 year old female with prior history of adenocarcinoma of lung, breast cancer, hypertension hyperlipidemia, chronic hypoxic respiratory failure due to severe COPD on home oxygen who presented with right thalamic intraparenchymal hemorrhage with IV.   Clinical Impression Pt demonstrates airway protection throughout study, no signs of stroke-related neuromuscular changes to swallow mechanism. Timing WNL, no residue or weakness. Pt does have a prominent cricopharyngeus, neither pill nor solids lodged there. Esophageal sweep showed some distal stasis with puree, pill. Pt is still easily fatigued and was moderately tachypneic during exam though O2 sats remained stable (did not test all textures due to pt fatigue). Recommend pt resume a regular diet and thin liquids. May want to continue cortrak for supplemented nutrition as pts respiratory endurance at meals may be limited. Factors that may increase risk of adverse event in presence of aspiration Charlotte Harris & Clearance Charlotte Harris 2021): Respiratory or GI disease;Frail or deconditioned  Swallow Evaluation Recommendations Recommendations: PO diet PO Diet Recommendation: Regular;Thin liquids (Level 0) Liquid Administration via: Cup;Straw Medication Administration: Whole meds with liquid Supervision: Staff to assist with self-feeding Swallowing strategies  : Slow rate;Small bites/sips Postural changes: Position pt fully upright for meals;Stay upright 30-60 min after meals      Shakerria Parran, [name] 07/12/2023,2:31 PM

## 2023-07-12 NOTE — Evaluation (Signed)
Occupational Therapy Evaluation Patient Details Name: Charlotte Harris MRN: [id number] DOB: 28-Oct-1931 Today's Date: 07/12/2023   History of Present Illness Pt is an 89 y.o. female who presented 07/08/23 s/p fall and with L-sided weakness. Pt found to have right thalamic and frontal white matter  intraparenchymal hematomas with intraventricular extension. Hospitalization complicated by acute hypoxic respiratory failure secondary to aspiration pneumonia. PMH: adenocarcinoma of lung, breast cancer, COPD, HLD, HTN, hypothyroidism, OA, osteoporosis   Clinical Impression   Pt admitted with ICH with Left side hemiparesis. Pt currently with functional limitations due to the deficits listed below (see OT Problem List). Prior to admit, pt was living alone receiving intermittent assistance from personal care attendant to complete BADL tasks. Pt currently demonstrating L side weakness and neglect requiring increased physical assist for all daily tasks. Patient will benefit from continued inpatient follow up therapy, <3 hours/day.  Pt will benefit from acute skilled OT to increase their safety and independence with ADL and functional mobility for ADL to facilitate discharge. Provided pt with green resistive foam block to use with left hand and educated on activities to complete. OT will continue to follow patient acutely.         If plan is discharge home, recommend the following: A lot of help with walking and/or transfers;Two people to help with bathing/dressing/bathroom;Help with stairs or ramp for entrance;Supervision due to cognitive status;Direct supervision/assist for financial management;Direct supervision/assist for medications management    Functional Status Assessment  Patient has had a recent decline in their functional status and demonstrates the ability to make significant improvements in function in a reasonable and predictable amount of time.  Equipment Recommendations  Other (comment)  (defer to next venue of care)       Precautions / Restrictions Precautions Precautions: Fall;Other (comment) Precaution Comments: watch SpO2 & RR (on 4L baseline); Goal SBP < 160 Restrictions Weight Bearing Restrictions: No      Mobility Bed Mobility    General bed mobility comments: pt up in recliner upon therapy arrival    Transfers    General transfer comment: Pt recently transferred into recliner and cleaned up by nursing demonstrating increased fatigue. T/F deferred at this time. See PT eval for transfer performance.      Balance Overall balance assessment: Needs assistance Sitting-balance support: Feet unsupported, Bilateral upper extremity supported Sitting balance-Leahy Scale: Zero Sitting balance - Comments: Pt unable to maintain upright sitting posture without assist from external positioning techniques. Multiple pillows used while pt seated in recliner due to heavy L lateral lean. Postural control: Left lateral lean      ADL either performed or assessed with clinical judgement   ADL Overall ADL's : Needs assistance/impaired Eating/Feeding: NPO   Grooming: Brushing hair;Moderate assistance;Sitting   Upper Body Bathing: Moderate assistance;Sitting   Lower Body Bathing: Maximal assistance;Sit to/from stand   Upper Body Dressing : Moderate assistance;Sitting   Lower Body Dressing: Maximal assistance;Sit to/from stand   Toilet Transfer: Moderate assistance;Stand-pivot   Toileting- Clothing Manipulation and Hygiene: Total assistance;Bed level       Vision Baseline Vision/History: 1 Wears glasses;6 Macular Degeneration (AMD OS) Ability to See in Adequate Light: 1 Impaired Patient Visual Report: No change from baseline Vision Assessment?: No apparent visual deficits Additional Comments: Vision appears to functional and at baseline.     Perception Perception: Impaired Preception Impairment Details: Inattention/Neglect, Spatial orientation Perception-Other  Comments: L neglect   Praxis Praxis: Impaired Praxis Impairment Details: Motor planning, Organization     Pertinent Vitals/Pain Pain  Assessment Pain Assessment: Faces Pain Score: 0-No pain Faces Pain Scale: Hurts little more     Extremity/Trunk Assessment Upper Extremity Assessment Upper Extremity Assessment: Right hand dominant;LUE deficits/detail LUE Deficits / Details: Strength is age appropriate. Motor ataxia present as pt demonstrates A/ROM. Shoulder A/ROM is ~50-75% range, full elbow, wrist, and hand ROM. LUE:  (no shoulder subluxation noted) LUE Sensation: decreased proprioception LUE Coordination: decreased fine motor;decreased gross motor   Lower Extremity Assessment Lower Extremity Assessment: Defer to PT evaluation   Cervical / Trunk Assessment Cervical / Trunk Assessment: Kyphotic;Other exceptions Cervical / Trunk Exceptions: impaired postural control and strength with poor proprioception   Communication Communication Communication: No apparent difficulties   Cognition Arousal: Alert Behavior During Therapy: WFL for tasks assessed/performed Overall Cognitive Status: Impaired/Different from baseline Area of Impairment: Safety/judgement, Memory, Problem solving        Memory: Decreased short-term memory   Safety/Judgement: Decreased awareness of deficits   Problem Solving: Requires verbal cues General Comments: Pt's daughter was present during evaluation and provided feedback when pt was not providing adequate information regarding time line of prior level of function.     General Comments  4L O2 [place] SpO2 97%, HR tachy throughout session at rest. Pt demonstrated labored mouth and nose breathing. 159/71 (94) BP while seated. Multiple bruising on arms.            Home Living Family/patient expects to be discharged to:: Private residence Living Arrangements: Alone Available Help at Discharge: Personal care attendant;Available PRN/intermittently;Friend(s)  (PCA MWF from 10 AM - 2 PM; friend checks on her 1x/week) Type of Home: House Home Access: Level entry     Home Layout: One level     Bathroom Shower/Tub: Walk-in shower;Tub/shower unit;Sponge bathes at baseline (1 of each; has been sponge bathing sitting at sink for the last 6 months)   Bathroom Toilet: Standard     Home Equipment: Agricultural consultant (2 wheels)   Additional Comments: 4L O2 baseline      Prior Functioning/Environment Prior Level of Function : Needs assist    Mobility Comments: Mod I furniture surfing with a shuffling gait pattern, only tolerating short household distances before she fatigues and needs to rest. Does not use RW at baseline ADLs Comments: PCA does some cooking, but pt does use the microwave and intermittently the oven to warm up food; she has someone come clean the house so she does not do the cleaning; does not leave the house, and does not drive; manages her own meds and finances; Orders groceries online and has them delivered; takes her ~2 hours to get ready in the morning        OT Problem List: Decreased strength;Decreased range of motion;Impaired balance (sitting and/or standing);Decreased activity tolerance;Impaired vision/perception;Decreased coordination;Decreased cognition;Decreased safety awareness;Impaired sensation;Impaired tone;Impaired UE functional use      OT Treatment/Interventions: Self-care/ADL training;Therapeutic exercise;Neuromuscular education;Energy conservation;DME and/or AE instruction;Manual therapy;Modalities;Splinting;Therapeutic activities;Cognitive remediation/compensation;Visual/perceptual remediation/compensation;Patient/family education;Balance training    OT Goals(Current goals can be found in the care plan section) Acute Rehab OT Goals Patient Stated Goal: to rest OT Goal Formulation: Patient unable to participate in goal setting Time For Goal Achievement: 07/26/23 Potential to Achieve Goals: Good  OT Frequency: Min  1X/week       AM-PAC OT "6 Clicks" Daily Activity     Outcome Measure Help from another person eating meals?: Total Help from another person taking care of personal grooming?: A Lot Help from another person toileting, which includes using toliet, bedpan, or urinal?: Total  Help from another person bathing (including washing, rinsing, drying)?: Total Help from another person to put on and taking off regular upper body clothing?: A Lot Help from another person to put on and taking off regular lower body clothing?: Total 6 Click Score: 8   End of Session Equipment Utilized During Treatment: Oxygen Nurse Communication:  (Nursing present during evaluation)  Activity Tolerance: Patient limited by fatigue;Patient tolerated treatment well Patient left: in chair;with chair alarm set;with call bell/phone within reach;with nursing/sitter in room;with family/visitor present  OT Visit Diagnosis: Unsteadiness on feet (R26.81);Muscle weakness (generalized) (M62.81);Hemiplegia and hemiparesis Hemiplegia - Right/Left: Left Hemiplegia - dominant/non-dominant: Non-Dominant Hemiplegia - caused by: Nontraumatic intracerebral hemorrhage                Time: 0922-0952 OT Time Calculation (min): 30 min Charges:  OT General Charges $OT Visit: 1 Visit OT Evaluation $OT Eval High Complexity: 1 High OT Treatments $Therapeutic Activity: 8-22 mins  Limmie Patricia, OTR/L,CBIS  Supplemental OT - [facility] and [facility] Secure Chat Preferred    Barbarann Kelly, Charisse March 07/12/2023, 10:25 AM

## 2023-07-12 NOTE — Progress Notes (Signed)
STROKE TEAM PROGRESS NOTE   BRIEF HPI [name] is a 89 y.o. female with PMNH significant for lung/breast cancer s/p radiation, COPD on 24hour oxygen at home, HTN, HLD, hypothyroidism, OA who presented 12/5 d/t left-sided weakness, left facial droop, left sensory deficit.  Imaging showed Right Thalamic ICH with IVE [id number] 12/5 mRs:1 ICH score: 2 No TNK or EVT offered due to ICH.  NIH on Admission: 63  SIGNIFICANT HOSPITAL EVENTS 12/5: Admitted due to right thalamic ICH with I'VE  12/6: CCM consulted due to acute respiratory distress noted after MRI.  DNR status per family's wishes  INTERIM HISTORY/SUBJECTIVE Daughter is at bedside.  Patient is sitting in chair, still has chronic/baseline shortness of breath, seems tolerating so far.  PT and OT recommend SNF.    OBJECTIVE  CBC    Component Value Date/Time   WBC 17.8 (H) 07/12/2023 0421   RBC 2.86 (L) 07/12/2023 0421   HGB 8.9 (L) 07/12/2023 0421   HCT 28.7 (L) 07/12/2023 0421   PLT 316 07/12/2023 0421   MCV 100.3 (H) 07/12/2023 0421   MCH 31.1 07/12/2023 0421   MCHC 31.0 07/12/2023 0421   RDW 12.9 07/12/2023 0421   LYMPHSABS 0.4 (L) 07/08/2023 1518   MONOABS 0.8 07/08/2023 1518   EOSABS 0.0 07/08/2023 1518   BASOSABS 0.0 07/08/2023 1518    BMET    Component Value Date/Time   NA 143 07/12/2023 0421   K 3.8 07/12/2023 0421   CL 96 (L) 07/12/2023 0421   CO2 39 (H) 07/12/2023 0421   GLUCOSE 119 (H) 07/12/2023 0421   BUN 34 (H) 07/12/2023 0421   BUN 19.9 09/02/2015 1309   CREATININE 0.64 07/12/2023 0421   CREATININE 0.9 09/02/2015 1309   CALCIUM 9.2 07/12/2023 0421   CALCIUM 9.6 07/05/2012 1409   EGFR 61 (L) 09/02/2015 1309   GFRNONAA >60 07/12/2023 0421    IMAGING past 24 hours DG Swallowing Func-Speech Pathology  Result Date: 07/12/2023 Table formatting from the original result was not included. Modified Barium Swallow Study Patient Details Name: Charlotte Harris MRN: [id number] Date of  Birth: Nov 08, 1931 Today's Date: 07/12/2023 HPI/PMH: HPI: 89 year old female with prior history of adenocarcinoma of lung, breast cancer, hypertension hyperlipidemia, chronic hypoxic respiratory failure due to severe COPD on home oxygen who presented with right thalamic intraparenchymal hemorrhage with IV. Clinical Impression: Clinical Impression: Pt demonstrates airway protection throughout study, no signs of stroke-related neuromuscular changes to swallow mechanism. Timing WNL, no residue or weakness. Pt does have a prominent cricopharyngeus, neither pill nor solids lodged there. Esophageal sweep showed some distal stasis with puree, pill. Pt is still easily fatigued and was moderately tachypneic during exam though O2 sats remained stable (did not test all textures due to pt fatigue). Recommend pt resume a regular diet and thin liquids. May want to continue cortrak for supplemented nutrition as pts respiratory endurance at meals may be limited. Factors that may increase risk of adverse event in presence of aspiration Rubye Oaks & Clearance Coots 2021): Factors that may increase risk of adverse event in presence of aspiration Rubye Oaks & Clearance Coots 2021): Respiratory or GI disease; Frail or deconditioned Recommendations/Plan: Swallowing Evaluation Recommendations Swallowing Evaluation Recommendations Recommendations: PO diet PO Diet Recommendation: Regular; Thin liquids (Level 0) Liquid Administration via: Cup; Straw Medication Administration: Whole meds with liquid Supervision: Staff to assist with self-feeding Swallowing strategies  : Slow rate; Small bites/sips Postural changes: Position pt fully upright for meals; Stay upright 30-60 min after meals Treatment Plan Treatment Plan Treatment  recommendations: Therapy as outlined in treatment plan below Follow-up recommendations: Skilled nursing-short term rehab (<3 hours/day) Treatment frequency: Min 2x/week Treatment duration: 1 week Interventions: Aspiration precaution training;  Patient/family education Recommendations Recommendations for follow up therapy are one component of a multi-disciplinary discharge planning process, led by the attending physician.  Recommendations may be updated based on patient status, additional functional criteria and insurance authorization. Assessment: Orofacial Exam: Orofacial Exam Oral Cavity: Oral Hygiene: WFL Oral Cavity - Dentition: Adequate natural dentition Orofacial Anatomy: WFL Oral Motor/Sensory Function: WFL Anatomy: Anatomy: Other (Comment) (calcification in laryngeal cartilage?) Boluses Administered: No data recorded Oral Impairment Domain: Oral Impairment Domain Lip Closure: No labial escape Tongue control during bolus hold: Not tested Bolus preparation/mastication: Timely and efficient chewing and mashing Bolus transport/lingual motion: Brisk tongue motion Oral residue: Complete oral clearance Location of oral residue : N/A Initiation of pharyngeal swallow : Posterior angle of the ramus  Pharyngeal Impairment Domain: Pharyngeal Impairment Domain Soft palate elevation: No bolus between soft palate (SP)/pharyngeal wall (PW) Laryngeal elevation: Complete superior movement of thyroid cartilage with complete approximation of arytenoids to epiglottic petiole Anterior hyoid excursion: Complete anterior movement Epiglottic movement: Partial inversion Laryngeal vestibule closure: Incomplete, narrow column air/contrast in laryngeal vestibule Pharyngeal stripping wave : Present - complete Tongue base retraction: No contrast between tongue base and posterior pharyngeal wall (PPW) Pharyngeal residue: Complete pharyngeal clearance  Esophageal Impairment Domain: No data recorded Pill: No data recorded Penetration/Aspiration Scale Score: Penetration/Aspiration Scale Score 1.  Material does not enter airway: Thin liquids (Level 0); Puree; Solid Compensatory Strategies: Compensatory Strategies Compensatory strategies: No   General Information: Caregiver present:  No  Diet Prior to this Study: NPO   Temperature : Normal   Respiratory Status: Tachypneic   Supplemental O2: Nasal cannula   History of Recent Intubation: No  Behavior/Cognition: Alert; Cooperative; Pleasant mood Self-Feeding Abilities: Needs assist with self-feeding Baseline vocal quality/speech: Normal Volitional Cough: Able to elicit Volitional Swallow: Able to elicit Exam Limitations: No limitations Goal Planning: Prognosis for improved oropharyngeal function: Fair Barriers to Reach Goals: Overall medical prognosis No data recorded No data recorded No data recorded Pain: Pain Assessment Pain Assessment: Faces Pain Score: 0 Faces Pain Scale: 4 Pain Location: generalized discomfort with increased work of breathing Pain Descriptors / Indicators: Discomfort Pain Intervention(s): Limited activity within patient's tolerance; Monitored during session; Repositioned End of Session: Start Time:SLP Start Time (ACUTE ONLY): 1240 Stop Time: SLP Stop Time (ACUTE ONLY): 1258 Time Calculation:SLP Time Calculation (min) (ACUTE ONLY): 18 min Charges: SLP Evaluations $ SLP Speech Visit: 1 Visit SLP Evaluations $MBS Swallow: 1 Procedure $Swallowing Treatment: 1 Procedure SLP visit diagnosis: SLP Visit Diagnosis: Dysphagia, unspecified (R13.10) Past Medical History: Past Medical History: Diagnosis Date  ADENOCARCINOMA, LUNG 07/03/2010  RUL  ASYMPTOMATIC POSTMENOPAUSAL STATUS 07/02/2009  Breast cancer (HCC) 1991  R mastectomy, no chemo or radiation  COLONIC POLYPS, HX OF 04/16/2007  COPD 05/17/2008  HEMATURIA, HX OF 04/16/2007  History of radiation therapy 07/30/09 to 08/09/09  RUL lung  HYPERLIPIDEMIA 04/16/2007  Hypertension   HYPERTENSION 04/16/2007  HYPOTHYROIDISM, POST-RADIATION 06/07/2008  Osteoarthritis   OSTEOPOROSIS 07/02/2009  Wrist fracture, left  Past Surgical History: Past Surgical History: Procedure Laterality Date  BREAST SURGERY    lumpectomy  EYE SURGERY Bilateral   cataracts  LUNG BIOPSY  08/31/11  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& focal  slight atypia  LUNG BIOPSY  05/22/2009  RUL lung=Adenocarcinoma  MASTECTOMY  1991  right  TONSILLECTOMY  1955 [place], [name] 07/12/2023, 2:33 PM   Vitals:  07/12/23 1700 07/12/23 1800 07/12/23 1847 07/12/23 1936  BP: (!) 152/87 (!) 175/95 (!) 161/99 (!) 159/90  Pulse: (!) 113  (!) 107   Resp: (!) 30   20  Temp:   99.6 F (37.6 C) 98.4 F (36.9 C)  TempSrc:   Oral Oral  SpO2: 96%  100% 96%  Weight:      Height:        PHYSICAL EXAM General:  Alert, chronically ill-appearing . CV: Tachycardic, irregular Respiratory: Labored breathing, on nasal cannula.  Tachypnea, Dyspnea with activity. Tripod positioning.  NEURO:  Mental Status: She is alert and oriented, gives clear history. Speech/language: No dysarthria or aphasia present.  Naming and repetition intact  Cranial Nerves:  II: PERRL.  Decreased vs inconsistent left visual field III, IV, VI: EOMI. Eyelids elevate symmetrically.  V: Sensation is intact to light touch and symmetrical to face.  VII: slight left facial droop VIII: hearing intact to voice. IX, X: Palate elevates symmetrically. Phonation is normal.  UJ:[id number] shrug 5/5. XII: tongue is midline without fasciculations. Motor:  5/5 RLE/RUE with no drift.  LUE 4-/5 with drift and decreased coordination, 4-/5 grip LLE 4-/5 distal weaker than proximal  Tone: is normal and bulk is normal Sensation- decreased sensation continues to left arm. Coordination: Unable to perform FTN or HKS testing on left side. Gait- deferred  Most Recent NIH: 7   ASSESSMENT/PLAN  ICH:  right thalamic ICH with IVH, etiology likely hypertensive due to location and risk factors.    CT head  2.4x1.7cm right thalamic hemorrhage with I'VE. Age-indeterminate infarcts in left basal ganglia CTA head & neck No evidecne of acute extravasation, no intracranial LVO Follow-up CT: Unchanged hematomas extending into right lateral ventricle MRI  Unchanged IPH with IVE 2D Echo: LVEF 60 to  65%, mild LVH, moderate aortic valve calcification, no shunt LDL 88 HgbA1c 5.6 UDS negative VTE prophylaxis - SQ heparin  aspirin 81 mg daily prior to admission, continue No antithrombotic due to ICH Therapy recommendations:  SNF Disposition:  pending  Acute hypoxic respiratory distress ?  Aspiration pneumonia History of COPD Gold stage IV History of right upper lobe adenocarcinoma s/p radiation CCM consult, appreciate assistance CTA negative for PE, consistent with aspiration Aspiration precautions Continue scheduled bronchodilators Continue IV steroids  Hypertension Continue Home meds:  losartan 100mg , metoprolol 25mg , nifedipine 30mg   Continued losartan 100mg , metoprolol 25mg  Increased Nifedipine to 90mg  Cleviprex IV off  Labetalol IV Q2h PRN BP goal: now SBP less than 160 Long-term BP goal normotensive  Hyperlipidemia Home meds:  pravastatin 20mg  LDL 88, goal < 70 Increase pravastatin to 40  Continue statin at discharge  Diabetes type II Controlled Home meds:  metformin 500mg  HgbA1c 5.6, goal < 7.0 CBGs SSI Recommend close follow-up with PCP  Cerebral aneurysms Bilateral cavernous ICA aneurysms, 14mm on left, 4mm on right. No intervention at this time given ICH Follow-up as outpatient  Dysphagia Patient has post-stroke dysphagia SLP consulted  currently n.p.o.  CoreTRak in place On tube feeding MBS tomorrow  Other Stroke Risk Factors Advanced Age Former smoker  Other Active Problems Hx of adenocarcinoma, right upper lung s/p radiation Hx of Breast Cancer Unchanged meningioma, CT and MRI head 0.8x3.2 cm parafalcine meningioma Tachycardia due to respiratory distress  Hospital day # 4   Marvel Plan, MD PhD Stroke Neurology 07/12/2023 7:38 PM  This patient is critically ill due to ICH, IVH, respiratory distress, hypertensive emergency and at significant risk of neurological worsening, death form hematoma expansion, brain  herniation, respiratory  failure, hypertensive encephalopathy. This patient's care requires constant monitoring of vital signs, hemodynamics, respiratory and cardiac monitoring, review of multiple databases, neurological assessment, discussion with family, other specialists and medical decision making of high complexity. I spent 30 minutes of neurocritical care time in the care of this patient. I had long discussion with daughter at bedside, updated pt current condition, treatment plan and potential prognosis, and answered all the questions.  She expressed understanding and appreciation.

## 2023-07-13 DIAGNOSIS — J9621 Acute and chronic respiratory failure with hypoxia: Secondary | ICD-10-CM | POA: Diagnosis not present

## 2023-07-13 DIAGNOSIS — E43 Unspecified severe protein-calorie malnutrition: Secondary | ICD-10-CM

## 2023-07-13 DIAGNOSIS — I610 Nontraumatic intracerebral hemorrhage in hemisphere, subcortical: Secondary | ICD-10-CM | POA: Diagnosis not present

## 2023-07-13 LAB — CBC
HCT: 27 % — ABNORMAL LOW (ref 36.0–46.0)
Hemoglobin: 8.3 g/dL — ABNORMAL LOW (ref 12.0–15.0)
MCH: 30.5 pg (ref 26.0–34.0)
MCHC: 30.7 g/dL (ref 30.0–36.0)
MCV: 99.3 fL (ref 80.0–100.0)
Platelets: 322 10*3/uL (ref 150–400)
RBC: 2.72 MIL/uL — ABNORMAL LOW (ref 3.87–5.11)
RDW: 13.1 % (ref 11.5–15.5)
WBC: 14 10*3/uL — ABNORMAL HIGH (ref 4.0–10.5)
nRBC: 0.1 % (ref 0.0–0.2)

## 2023-07-13 LAB — GLUCOSE, CAPILLARY
Glucose-Capillary: 141 mg/dL — ABNORMAL HIGH (ref 70–99)
Glucose-Capillary: 152 mg/dL — ABNORMAL HIGH (ref 70–99)
Glucose-Capillary: 159 mg/dL — ABNORMAL HIGH (ref 70–99)
Glucose-Capillary: 173 mg/dL — ABNORMAL HIGH (ref 70–99)
Glucose-Capillary: 192 mg/dL — ABNORMAL HIGH (ref 70–99)

## 2023-07-13 LAB — BASIC METABOLIC PANEL
Anion gap: 13 (ref 5–15)
BUN: 35 mg/dL — ABNORMAL HIGH (ref 8–23)
CO2: 32 mmol/L (ref 22–32)
Calcium: 9.3 mg/dL (ref 8.9–10.3)
Chloride: 96 mmol/L — ABNORMAL LOW (ref 98–111)
Creatinine, Ser: 0.64 mg/dL (ref 0.44–1.00)
GFR, Estimated: >60 mL/min (ref >60)
Glucose, Bld: 171 mg/dL — ABNORMAL HIGH (ref 70–99)
Potassium: 4 mmol/L (ref 3.5–5.1)
Sodium: 141 mmol/L (ref 135–145)

## 2023-07-13 MED ORDER — INSULIN ASPART 100 UNIT/ML IJ SOLN
0.0000 [IU] | Freq: Three times a day (TID) | INTRAMUSCULAR | Status: DC
  Administered 2023-07-14: 3 [IU] via SUBCUTANEOUS
  Administered 2023-07-15: 2 [IU] via SUBCUTANEOUS

## 2023-07-13 MED ORDER — ENSURE ENLIVE PO LIQD
237.0000 mL | Freq: Two times a day (BID) | ORAL | Status: DC
  Administered 2023-07-14 – 2023-07-15 (×2): 237 mL via ORAL

## 2023-07-13 NOTE — Progress Notes (Signed)
Cpt not done due pt being very anxious

## 2023-07-13 NOTE — Progress Notes (Signed)
STROKE TEAM PROGRESS NOTE   BRIEF HPI [name] is a 89 y.o. female with PMNH significant for lung/breast cancer s/p radiation, COPD on 24hour oxygen at home, HTN, HLD, hypothyroidism, OA who presented 12/5 d/t left-sided weakness, left facial droop, left sensory deficit.  Imaging showed Right Thalamic ICH with IVE [id number] 12/5 mRs:1 ICH score: 2 No TNK or EVT offered due to ICH.  NIH on Admission: 70  SIGNIFICANT HOSPITAL EVENTS 12/5: Admitted due to right thalamic ICH with I'VE  12/6: CCM consulted due to acute respiratory distress noted after MRI.  DNR status per family's wishes  INTERIM HISTORY/SUBJECTIVE Son is at bedside.  Patient is reclining bed, initially taking a nap but arouse with voice. Her breathing has improved and less SOB and respiratory distress today.  Pending SNF.    OBJECTIVE  CBC    Component Value Date/Time   WBC 14.0 (H) 07/13/2023 0447   RBC 2.72 (L) 07/13/2023 0447   HGB 8.3 (L) 07/13/2023 0447   HCT 27.0 (L) 07/13/2023 0447   PLT 322 07/13/2023 0447   MCV 99.3 07/13/2023 0447   MCH 30.5 07/13/2023 0447   MCHC 30.7 07/13/2023 0447   RDW 13.1 07/13/2023 0447   LYMPHSABS 0.4 (L) 07/08/2023 1518   MONOABS 0.8 07/08/2023 1518   EOSABS 0.0 07/08/2023 1518   BASOSABS 0.0 07/08/2023 1518    BMET    Component Value Date/Time   NA 141 07/13/2023 0447   K 4.0 07/13/2023 0447   CL 96 (L) 07/13/2023 0447   CO2 32 07/13/2023 0447   GLUCOSE 171 (H) 07/13/2023 0447   BUN 35 (H) 07/13/2023 0447   BUN 19.9 09/02/2015 1309   CREATININE 0.64 07/13/2023 0447   CREATININE 0.9 09/02/2015 1309   CALCIUM 9.3 07/13/2023 0447   CALCIUM 9.6 07/05/2012 1409   EGFR 61 (L) 09/02/2015 1309   GFRNONAA >60 07/13/2023 0447    IMAGING past 24 hours No results found.  Vitals:   07/13/23 0741 07/13/23 0828 07/13/23 1156 07/13/23 1607  BP: 129/79  (!) 154/73 135/70  Pulse: 100 (!) 102 97 98  Resp: 18 19  17   Temp: 99.2 F (37.3 C)  98 F (36.7  C) 99.3 F (37.4 C)  TempSrc: Oral  Oral Oral  SpO2: 98% 100%  97%  Weight:      Height:        PHYSICAL EXAM General:  Alert, chronically ill-appearing . CV: Tachycardic, irregular Respiratory: Labored breathing improved, on nasal cannula.    NEURO:  Mental Status: She is alert and oriented, gives clear history. Speech/language: No dysarthria or aphasia present.  Naming and repetition intact  Cranial Nerves:  II: PERRL.  Visual fields full III, IV, VI: EOMI. Eyelids elevate symmetrically.  V: Sensation is intact to light touch and symmetrical to face.  VII: slight left facial droop VIII: hearing intact to voice. IX, X: Palate elevates symmetrically. Phonation is normal.  [id number] shrug 5/5. XII: tongue is midline without fasciculations. Motor:  5/5 RLE/RUE with no drift.  LUE 3-/5 with drift to bed within 10 sec. LLE 3-/5 with drift to bed within 5 sec.  Tone: is normal and bulk is normal Sensation- Sensation decreased on the left and with left sensory neglect Coordination: left FTN ataxia but not out of proportion to the weakness  Gait- deferred   ASSESSMENT/PLAN  ICH:  right thalamic ICH with IVH, etiology likely hypertensive due to location and risk factors.    CT head  2.4x1.7cm  right thalamic hemorrhage with I'VE. Age-indeterminate infarcts in left basal ganglia CTA head & neck No evidecne of acute extravasation, no intracranial LVO Follow-up CT: Unchanged hematomas extending into right lateral ventricle MRI  Unchanged IPH with IVE 2D Echo: LVEF 60 to 65%, mild LVH, moderate aortic valve calcification, no shunt LDL 88 HgbA1c 5.6 UDS negative VTE prophylaxis - SQ heparin  aspirin 81 mg daily prior to admission, continue No antithrombotic due to ICH Therapy recommendations:  SNF Disposition:  pending  Acute hypoxic respiratory distress ?  Aspiration pneumonia History of COPD Gold stage IV History of right upper lobe adenocarcinoma s/p radiation CCM  consult, appreciate assistance CTA negative for PE, consistent with aspiration Aspiration precautions Continue scheduled bronchodilators Continue IV steroids  Hypertension Continue Home meds:  losartan 100mg , metoprolol 25mg , nifedipine 30mg   Continued losartan 100mg , metoprolol 25mg  Increased Nifedipine to 90mg  Cleviprex IV off  Labetalol IV Q2h PRN BP goal: now SBP less than 160 Long-term BP goal normotensive  Hyperlipidemia Home meds:  pravastatin 20mg  LDL 88, goal < 70 Increase pravastatin to 40  Continue statin at discharge  Diabetes type II Controlled Home meds:  metformin 500mg  HgbA1c 5.6, goal < 7.0 CBGs SSI Recommend close follow-up with PCP  Cerebral aneurysms Bilateral cavernous ICA aneurysms, 14mm on left, 4mm on right. No intervention at this time given ICH Follow-up as outpatient  Dysphagia Patient has post-stroke dysphagia SLP consulted  currently n.p.o.  CoreTRak in place On tube feeding MBS tomorrow  Other Stroke Risk Factors Advanced Age Former smoker  Other Active Problems Hx of adenocarcinoma, right upper lung s/p radiation Hx of Breast Cancer Unchanged meningioma, CT and MRI head 0.8x3.2 cm parafalcine meningioma Tachycardia due to respiratory distress  Hospital day # 5  Neurology will sign off. Please call with questions. Pt will follow up with stroke clinic NP at [facility] in about 4 weeks. Thanks for the consult.   Marvel Plan, MD PhD Stroke Neurology 07/13/2023 6:06 PM

## 2023-07-13 NOTE — [place] FL2 (Signed)
[place] MEDICAID FL2 LEVEL OF CARE FORM     IDENTIFICATION  Patient Name: Charlotte Harris Birthdate: [birth year] Sex: female Admission Date (Current Location): 07/08/2023  [facility] and [place] Number:  Producer, television/film/video and Address:  [facility] [place], [address], [place], [place] [address]      Provider Number: [id number]  Attending Physician Name and Address:  Narda Bonds, MD  Relative Name and Phone Number:       Current Level of Care: Hospital Recommended Level of Care: Skilled Nursing Facility Prior Approval Number:    Date Approved/Denied:   PASRR Number: [id number]  Discharge Plan: SNF    Current Diagnoses: Patient Active Problem List   Diagnosis Date Noted   Acute on chronic hypoxic respiratory failure (HCC) 07/12/2023   Aspiration into airway 07/10/2023   Pressure injury of skin 07/10/2023   Protein-calorie malnutrition, severe 07/09/2023   ICH (intracerebral hemorrhage) (HCC) 07/08/2023   Iron deficiency anemia 05/28/2023   Underweight due to inadequate caloric intake 05/15/2022   DNR (do not resuscitate) 08/11/2018   Solitary pulmonary nodule 07/29/2018   Hypoxemia    Lung mass    Normocytic normochromic anemia 07/19/2018   Type II diabetes mellitus with complication (HCC) 07/19/2018   Mass of upper lobe of left lung    Acute respiratory failure with hypoxia (HCC) 07/18/2018   Weight loss 07/21/2017   Hip pain, right 01/25/2017   Vitamin D deficiency 07/16/2016   Side effects of treatment 11/15/2014   Diabetes (HCC) 07/10/2012   Malignant neoplasm of right upper lobe of lung (HCC) 03/05/2012   Breast cancer (HCC)    History of radiation therapy    Cancer (HCC)    Encounter for long-term (current) use of other medications 07/06/2011   Malignant neoplasm of bronchus and lung (HCC) 07/03/2010   TOBACCO USE, QUIT 07/03/2010   Osteoporosis 07/02/2009   ASYMPTOMATIC POSTMENOPAUSAL STATUS 07/02/2009   PERIPHERAL EDEMA  02/15/2009   Hypothyroidism following radioiodine therapy 06/07/2008   COPD without exacerbation (HCC) 05/17/2008   Mixed hyperlipidemia 04/16/2007   HTN (hypertension) 04/16/2007   BREAST CANCER, HX OF 04/16/2007   History of colonic polyps 04/16/2007   HEMATURIA, HX OF 04/16/2007    Orientation RESPIRATION BLADDER Height & Weight     Self, Time, Situation, Place  O2 (Elim 2L) Incontinent Weight: 67 lb 10.9 oz (30.7 kg) Height:  5\' 5"  (165.1 cm)  BEHAVIORAL SYMPTOMS/MOOD NEUROLOGICAL BOWEL NUTRITION STATUS      Incontinent Diet (regular)  AMBULATORY STATUS COMMUNICATION OF NEEDS Skin   Extensive Assist Verbally PU Stage and Appropriate Care PU Stage 1 Dressing:  (coccyx: foam dressing, lift every shift to assess and change every 3 days or PRN)                     Personal Care Assistance Level of Assistance  Bathing, Feeding, Dressing Bathing Assistance: Maximum assistance Feeding assistance: Limited assistance Dressing Assistance: Maximum assistance     Functional Limitations Info  Sight Sight Info: Impaired        SPECIAL CARE FACTORS FREQUENCY  PT (By licensed PT), OT (By licensed OT)     PT Frequency: 5x/wk OT Frequency: 5x/wk            Contractures Contractures Info: Not present    Additional Factors Info  Code Status, Allergies, Insulin Sliding Scale Code Status Info: DNR Allergies Info: Crestor (Rosuvastatin Calcium)   Insulin Sliding Scale Info: see DC summary  Current Medications (07/13/2023):  This is the current hospital active medication list Current Facility-Administered Medications  Medication Dose Route Frequency Provider Last Rate Last Admin   acetaminophen (TYLENOL) tablet 650 mg  650 mg Oral Q4H PRN Elmer Picker, NP   650 mg at 07/13/23 0320   Or   acetaminophen (TYLENOL) 160 MG/5ML solution 650 mg  650 mg Per Tube Q4H PRN Elmer Picker, NP   650 mg at 07/11/23 2258   Or   acetaminophen (TYLENOL) suppository 650 mg  650 mg  Rectal Q4H PRN Elmer Picker, NP       budesonide (PULMICORT) nebulizer solution 0.5 mg  0.5 mg Nebulization BID Oretha Milch, MD   0.5 mg at 07/13/23 1610   Chlorhexidine Gluconate Cloth 2 % PADS 6 each  6 each Topical Daily Bhagat, Srishti L, MD   6 each at 07/13/23 0916   feeding supplement (PROSource TF20) liquid 60 mL  60 mL Per Tube Daily Cheri Fowler, MD   60 mL at 07/13/23 0916   heparin injection 5,000 Units  5,000 Units Subcutaneous Q12H Cheri Fowler, MD   5,000 Units at 07/13/23 0912   hydrALAZINE (APRESOLINE) injection 20 mg  20 mg Intravenous Q4H PRN Micki Riley, MD   20 mg at 07/12/23 1932   insulin aspart (novoLOG) injection 0-9 Units  0-9 Units Subcutaneous Q4H Elmer Picker, NP   2 Units at 07/13/23 0800   ipratropium-albuterol (DUONEB) 0.5-2.5 (3) MG/3ML nebulizer solution 3 mL  3 mL Nebulization BID PRN Hetty Blend C, NP   3 mL at 07/12/23 1956   labetalol (NORMODYNE) injection 20 mg  20 mg Intravenous Q2H PRN Micki Riley, MD   20 mg at 07/11/23 9604   levothyroxine (SYNTHROID) tablet 50 mcg  50 mcg Per Tube Q0600 Leander Rams, RPH   50 mcg at 07/13/23 0434   loperamide HCl (IMODIUM) 1 MG/7.5ML suspension 2 mg  2 mg Per Tube PRN Celine Mans, Rahul P, PA-C   2 mg at 07/12/23 1714   losartan (COZAAR) tablet 100 mg  100 mg Per Tube Daily Leander Rams, RPH   100 mg at 07/13/23 5409   metoprolol succinate (TOPROL-XL) 24 hr tablet 25 mg  25 mg Oral Daily Janeann Forehand D, NP   25 mg at 07/13/23 0916   metoprolol tartrate (LOPRESSOR) injection 2.5-5 mg  2.5-5 mg Intravenous Q2H PRN Celine Mans, Rahul P, PA-C   5 mg at 07/12/23 2014   multivitamin with minerals tablet 1 tablet  1 tablet Per Tube Daily Cheri Fowler, MD   1 tablet at 07/13/23 0916   NIFEdipine (PROCARDIA-XL/NIFEDICAL-XL) 24 hr tablet 90 mg  90 mg Oral Daily Hetty Blend C, NP   90 mg at 07/13/23 8119   pravastatin (PRAVACHOL) tablet 40 mg  40 mg Per Tube q1800 Marvel Plan, MD   40 mg at 07/12/23 1716   saline (AYR)  nasal gel with aloe 1 Application  1 Application Each Nare Q4H PRN Janeann Forehand D, NP       senna-docusate (Senokot-S) tablet 1 tablet  1 tablet Per Tube BID Leander Rams, [facility]   1 tablet at 07/13/23 1478   sodium chloride (OCEAN) 0.65 % nasal spray 1 spray  1 spray Each Nare PRN Janeann Forehand D, NP       thiamine (VITAMIN B1) tablet 100 mg  100 mg Per Tube Daily Cheri Fowler, MD   100 mg at 07/13/23 2956     Discharge Medications: Please  see discharge summary for a list of discharge medications.  Relevant Imaging Results:  Relevant Lab Results:   Additional Information SS#: [id number]  Baldemar Lenis, LCSW

## 2023-07-13 NOTE — Progress Notes (Addendum)
PROGRESS NOTE    Charlotte Harris  ZOX:[id number] DOB: 12-27-1931 DOA: 07/08/2023 PCP: Karie Georges, MD   Brief Narrative: Charlotte Harris is a 89 y.o. female with a history of lung cancer, breast cancer, hyperlipidemia, hypertension, severe COPD, chronic respiratory failure, osteoporosis, hypothyroidism.  Patient presented secondary to a fall with left-sided weakness was found to have evidence of a right thalamic ICH.  Patient was admitted by neurology and admitted to the ICU.  No antiplatelets administered secondary to ICH.  Patient managed with strict blood pressure control with goal systolic pressure of less than 130 to 150 mmHg managed on Cleviprex.  Patient transferred out of the ICU on 12/9.  Hypertension managed with nifedipine, losartan, metoprolol hospitalization complicated by acute on chronic respiratory failure secondary to aspiration pneumonia managed with antibiotics and requirement for NG tube placement which is now removed.   Assessment and Plan:  Right thalamic ICH with IVH Likely hypertensive in etiology per neurology. CT head (12/5)) significant for a 2.4 x 1.7 cm right thalamic hemorrhage with intraventricular extension. MRI brain (12/6) confirms right thalamic and frontal white matter intraparenchymal hematomas with intraventricular extension. CTA head and neck (12/5) significant for no LVO or significant stenosis. LDL of 88. Hemoglobin A1C of 5.6%. Transthoracic Echocardiogram significant for LVEF of 60-65% with no intracardiac source of embolism and no atrial level shunt detected. Neurology recommendations for good hypertensive control and no antithrombotic agent secondary to ICH. PT/OT recommendations for SNF.  Bilateral cavernous ICA aneurysms Noted on CTA head/neck. Aneurysms measuring up to 14 mm on left and 4 mm on right. No intervention at this time per neurology secondary to ICH. Recommendation for outpatient follow-up.  Acute on chronic respiratory  failure with hypoxia Patient is on 4 L/min of supplemental oxygen at baseline. Patient with development of respiratory distress this admission concerning for possible aspiration event. Patient weaned to 2 L/min of supplemental oxygen.  Aspiration pneumonia Suggested on CTA chest with evidence of multilobar acute left lung patchy and irregular peribronchial opacity with abnormal thickened and opacified airway. Patient started empirically on Ceftriaxone and azithromycin. -Continue Ceftriaxone and azithromycin  Severe COPD, Gold stage IV Patient follows with pulmonology as an outpatient. -Continue Pulmicort and Duonebs  History of right upper lobe adenocarcinoma S/p radiation.  Sinus tachycardia Presumed multifactorial in setting of respiratory distress and rebound tachycardia. Tachycardia is improved.  Leukocytosis Peak WBC of 19,700. In setting of pneumonia and after receiving steroids. Trending down.  Primary hypertension -Continue nifedipine, metoprolol succinate and losartan  Hyperlipidemia -Continue pravastatin  Hypothyroidism -Continue levothyroxine 50 mcg daily  Diabetes mellitus type 2 Well controlled with last hemoglobin A1C of 5.6%. -Continue SSI  Severe malnutrition NG tube was placed for enteral nutrition.  Speech therapy was consulted for evaluation.  Modified barium swallow was performed on 12/9 with subsequent recommendations for regular diet with thin liquids.  NG tube ordered for removal on 12/10.  Pressure injury Medial coccyx. Present on admission.   DVT prophylaxis: Heparin subcutaneous Code Status:   Code Status: Limited: Do not attempt resuscitation (DNR) -DNR-LIMITED -Do Not Intubate/DNI  Family Communication: Daughter at bedside Disposition Plan: Discharge to SNF when bed is available. Medically stable once feeding tube is removed.   Consultants:  PCCM Neurology/stroke  Procedures:  NG tube  placement  Antimicrobials: Ceftriaxone Azithromycin    Subjective: Patient reports eating well yesterday evening. Eager to have feeding tube removed. No dyspnea apart from baseline dyspnea  Objective: BP (!) 154/73 (BP Location: Right Arm)  Pulse 97   Temp 98 F (36.7 C) (Oral)   Resp 19   Ht 5\' 5"  (1.651 m)   Wt 30.7 kg   SpO2 100%   BMI 11.26 kg/m   Examination:  General exam: Appears calm and comfortable Respiratory system: Diminished, no wheezing, mild accessory muscle usage at times Cardiovascular system: S1 & S2 heard, RRR. Gastrointestinal system: Abdomen is nondistended, soft and nontender. Normal bowel sounds heard. Central nervous system: Alert and oriented. Left sided weakness Psychiatry: Judgement and insight appear normal. Mood & affect appropriate.    Data Reviewed: I have personally reviewed following labs and imaging studies  CBC Lab Results  Component Value Date   WBC 14.0 (H) 07/13/2023   RBC 2.72 (L) 07/13/2023   HGB 8.3 (L) 07/13/2023   HCT 27.0 (L) 07/13/2023   MCV 99.3 07/13/2023   MCH 30.5 07/13/2023   PLT 322 07/13/2023   MCHC 30.7 07/13/2023   RDW 13.1 07/13/2023   LYMPHSABS 0.4 (L) 07/08/2023   MONOABS 0.8 07/08/2023   EOSABS 0.0 07/08/2023   BASOSABS 0.0 07/08/2023     Last metabolic panel Lab Results  Component Value Date   NA 141 07/13/2023   K 4.0 07/13/2023   CL 96 (L) 07/13/2023   CO2 32 07/13/2023   BUN 35 (H) 07/13/2023   CREATININE 0.64 07/13/2023   GLUCOSE 171 (H) 07/13/2023   GFRNONAA >60 07/13/2023   GFRAA NOT CALCULATED 07/20/2018   CALCIUM 9.3 07/13/2023   PHOS 2.5 07/11/2023   PROT 6.9 07/08/2023   ALBUMIN 3.5 07/08/2023   BILITOT 0.7 07/08/2023   ALKPHOS 75 07/08/2023   AST 41 07/08/2023   ALT 16 07/08/2023   ANIONGAP 13 07/13/2023    GFR: Estimated Creatinine Clearance: 23.1 mL/min (by C-G formula based on SCr of 0.64 mg/dL).  Recent Results (from the past 240 hour(s))  MRSA Next Gen by PCR,  Nasal     Status: None   Collection Time: 07/08/23  4:47 PM   Specimen: Nasal Mucosa; Nasal Swab  Result Value Ref Range Status   MRSA by PCR Next Gen NOT DETECTED NOT DETECTED Final    Comment: (NOTE) The GeneXpert MRSA Assay (FDA approved for NASAL specimens only), is one component of a comprehensive MRSA colonization surveillance program. It is not intended to diagnose MRSA infection nor to guide or monitor treatment for MRSA infections. Test performance is not FDA approved in patients less than 74 years old. Performed at [facility], [address] [place], [place] [address]       Radiology Studies: DG Swallowing Func-Speech Pathology  Result Date: 07/12/2023 Table formatting from the original result was not included. Modified Barium Swallow Study Patient Details Name: DEZARIA TRUSS MRN: [id number] Date of Birth: [birth year] Today's Date: 07/12/2023 HPI/PMH: HPI: 89 year old female with prior history of adenocarcinoma of lung, breast cancer, hypertension hyperlipidemia, chronic hypoxic respiratory failure due to severe COPD on home oxygen who presented with right thalamic intraparenchymal hemorrhage with IV. Clinical Impression: Clinical Impression: Pt demonstrates airway protection throughout study, no signs of stroke-related neuromuscular changes to swallow mechanism. Timing WNL, no residue or weakness. Pt does have a prominent cricopharyngeus, neither pill nor solids lodged there. Esophageal sweep showed some distal stasis with puree, pill. Pt is still easily fatigued and was moderately tachypneic during exam though O2 sats remained stable (did not test all textures due to pt fatigue). Recommend pt resume a regular diet and thin liquids. May want to continue cortrak for supplemented  nutrition as pts respiratory endurance at meals may be limited. Factors that may increase risk of adverse event in presence of aspiration Rubye Oaks & Clearance Coots 2021): Factors that may increase risk of  adverse event in presence of aspiration Rubye Oaks & Clearance Coots 2021): Respiratory or GI disease; Frail or deconditioned Recommendations/Plan: Swallowing Evaluation Recommendations Swallowing Evaluation Recommendations Recommendations: PO diet PO Diet Recommendation: Regular; Thin liquids (Level 0) Liquid Administration via: Cup; Straw Medication Administration: Whole meds with liquid Supervision: Staff to assist with self-feeding Swallowing strategies  : Slow rate; Small bites/sips Postural changes: Position pt fully upright for meals; Stay upright 30-60 min after meals Treatment Plan Treatment Plan Treatment recommendations: Therapy as outlined in treatment plan below Follow-up recommendations: Skilled nursing-short term rehab (<3 hours/day) Treatment frequency: Min 2x/week Treatment duration: 1 week Interventions: Aspiration precaution training; Patient/family education Recommendations Recommendations for follow up therapy are one component of a multi-disciplinary discharge planning process, led by the attending physician.  Recommendations may be updated based on patient status, additional functional criteria and insurance authorization. Assessment: Orofacial Exam: Orofacial Exam Oral Cavity: Oral Hygiene: WFL Oral Cavity - Dentition: Adequate natural dentition Orofacial Anatomy: WFL Oral Motor/Sensory Function: WFL Anatomy: Anatomy: Other (Comment) (calcification in laryngeal cartilage?) Boluses Administered: No data recorded Oral Impairment Domain: Oral Impairment Domain Lip Closure: No labial escape Tongue control during bolus hold: Not tested Bolus preparation/mastication: Timely and efficient chewing and mashing Bolus transport/lingual motion: Brisk tongue motion Oral residue: Complete oral clearance Location of oral residue : N/A Initiation of pharyngeal swallow : Posterior angle of the ramus  Pharyngeal Impairment Domain: Pharyngeal Impairment Domain Soft palate elevation: No bolus between soft palate  (SP)/pharyngeal wall (PW) Laryngeal elevation: Complete superior movement of thyroid cartilage with complete approximation of arytenoids to epiglottic petiole Anterior hyoid excursion: Complete anterior movement Epiglottic movement: Partial inversion Laryngeal vestibule closure: Incomplete, narrow column air/contrast in laryngeal vestibule Pharyngeal stripping wave : Present - complete Tongue base retraction: No contrast between tongue base and posterior pharyngeal wall (PPW) Pharyngeal residue: Complete pharyngeal clearance  Esophageal Impairment Domain: No data recorded Pill: No data recorded Penetration/Aspiration Scale Score: Penetration/Aspiration Scale Score 1.  Material does not enter airway: Thin liquids (Level 0); Puree; Solid Compensatory Strategies: Compensatory Strategies Compensatory strategies: No   General Information: Caregiver present: No  Diet Prior to this Study: NPO   Temperature : Normal   Respiratory Status: Tachypneic   Supplemental O2: Nasal cannula   History of Recent Intubation: No  Behavior/Cognition: Alert; Cooperative; Pleasant mood Self-Feeding Abilities: Needs assist with self-feeding Baseline vocal quality/speech: Normal Volitional Cough: Able to elicit Volitional Swallow: Able to elicit Exam Limitations: No limitations Goal Planning: Prognosis for improved oropharyngeal function: Fair Barriers to Reach Goals: Overall medical prognosis No data recorded No data recorded No data recorded Pain: Pain Assessment Pain Assessment: Faces Pain Score: 0 Faces Pain Scale: 4 Pain Location: generalized discomfort with increased work of breathing Pain Descriptors / Indicators: Discomfort Pain Intervention(s): Limited activity within patient's tolerance; Monitored during session; Repositioned End of Session: Start Time:SLP Start Time (ACUTE ONLY): 1240 Stop Time: SLP Stop Time (ACUTE ONLY): 1258 Time Calculation:SLP Time Calculation (min) (ACUTE ONLY): 18 min Charges: SLP Evaluations $ SLP Speech  Visit: 1 Visit SLP Evaluations $MBS Swallow: 1 Procedure $Swallowing Treatment: 1 Procedure SLP visit diagnosis: SLP Visit Diagnosis: Dysphagia, unspecified (R13.10) Past Medical History: Past Medical History: Diagnosis Date  ADENOCARCINOMA, LUNG 07/03/2010  RUL  ASYMPTOMATIC POSTMENOPAUSAL STATUS 07/02/2009  Breast cancer (HCC) 1991  R mastectomy, no chemo or radiation  COLONIC POLYPS, HX OF 04/16/2007  COPD 05/17/2008  HEMATURIA, HX OF 04/16/2007  History of radiation therapy 07/30/09 to 08/09/09  RUL lung  HYPERLIPIDEMIA 04/16/2007  Hypertension   HYPERTENSION 04/16/2007  HYPOTHYROIDISM, POST-RADIATION 06/07/2008  Osteoarthritis   OSTEOPOROSIS 07/02/2009  Wrist fracture, left  Past Surgical History: Past Surgical History: Procedure Laterality Date  BREAST SURGERY    lumpectomy  EYE SURGERY Bilateral   cataracts  LUNG BIOPSY  08/31/11  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  LUNG BIOPSY  05/22/2009  RUL lung=Adenocarcinoma  MASTECTOMY  1991  right  TONSILLECTOMY  1955 [place], [name] 07/12/2023, 2:33 PM     LOS: 5 days    Jacquelin Hawking, MD [facility] 07/13/2023, 2:39 PM   If 7PM-7AM, please contact night-coverage [email or web address]

## 2023-07-13 NOTE — Hospital Course (Addendum)
Charlotte Harris is a 89 y.o. female with a history of lung cancer, breast cancer, hyperlipidemia, hypertension, severe COPD, chronic respiratory failure, osteoporosis, hypothyroidism.  Patient presented secondary to a fall with left-sided weakness was found to have evidence of a right thalamic ICH.  Patient was admitted by neurology and admitted to the ICU.  No antiplatelets administered secondary to ICH.  Patient managed with strict blood pressure control with goal systolic pressure of less than 130 to 150 mmHg managed on Cleviprex.  Patient transferred out of the ICU on 12/9.  Hypertension managed with nifedipine, losartan, metoprolol hospitalization complicated by acute on chronic respiratory failure secondary to aspiration pneumonia managed with antibiotics and requirement for NG tube placement which is now removed. At this time she is tolerating diet mentating well alert awake, she is deconditioned weak and will need extensive rehabilitation and is being discharged to skilled nursing facility 07/16/23

## 2023-07-13 NOTE — Plan of Care (Signed)
Conts on stroke protocol, tolerating oral feeds, NGT and TF discontinued. Plan for SNF.   Problem: Education: Goal: Knowledge of disease or condition will improve Outcome: Progressing Goal: Knowledge of secondary prevention will improve (MUST DOCUMENT ALL) Outcome: Progressing Goal: Knowledge of patient specific risk factors will improve Loraine Leriche N/A or DELETE if not current risk factor) Outcome: Progressing   Problem: Intracerebral Hemorrhage Tissue Perfusion: Goal: Complications of Intracerebral Hemorrhage will be minimized Outcome: Progressing   Problem: Coping: Goal: Will verbalize positive feelings about self Outcome: Progressing Goal: Will identify appropriate support needs Outcome: Progressing   Problem: Health Behavior/Discharge Planning: Goal: Ability to manage health-related needs will improve Outcome: Progressing Goal: Goals will be collaboratively established with patient/family Outcome: Progressing   Problem: Self-Care: Goal: Ability to participate in self-care as condition permits will improve Outcome: Progressing Goal: Verbalization of feelings and concerns over difficulty with self-care will improve Outcome: Progressing Goal: Ability to communicate needs accurately will improve Outcome: Progressing   Problem: Nutrition: Goal: Risk of aspiration will decrease Outcome: Progressing Goal: Dietary intake will improve Outcome: Progressing   Problem: Education: Goal: Ability to describe self-care measures that may prevent or decrease complications (Diabetes Survival Skills Education) will improve Outcome: Progressing Goal: Individualized Educational Video(s) Outcome: Progressing   Problem: Coping: Goal: Ability to adjust to condition or change in health will improve Outcome: Progressing   Problem: Fluid Volume: Goal: Ability to maintain a balanced intake and output will improve Outcome: Progressing   Problem: Health Behavior/Discharge Planning: Goal:  Ability to identify and utilize available resources and services will improve Outcome: Progressing Goal: Ability to manage health-related needs will improve Outcome: Progressing   Problem: Metabolic: Goal: Ability to maintain appropriate glucose levels will improve Outcome: Progressing   Problem: Nutritional: Goal: Maintenance of adequate nutrition will improve Outcome: Progressing Goal: Progress toward achieving an optimal weight will improve Outcome: Progressing   Problem: Skin Integrity: Goal: Risk for impaired skin integrity will decrease Outcome: Progressing   Problem: Tissue Perfusion: Goal: Adequacy of tissue perfusion will improve Outcome: Progressing   Problem: Education: Goal: Knowledge of General Education information will improve Description: Including pain rating scale, medication(s)/side effects and non-pharmacologic comfort measures Outcome: Progressing   Problem: Health Behavior/Discharge Planning: Goal: Ability to manage health-related needs will improve Outcome: Progressing   Problem: Clinical Measurements: Goal: Ability to maintain clinical measurements within normal limits will improve Outcome: Progressing Goal: Will remain free from infection Outcome: Progressing Goal: Diagnostic test results will improve Outcome: Progressing Goal: Respiratory complications will improve Outcome: Progressing Goal: Cardiovascular complication will be avoided Outcome: Progressing   Problem: Activity: Goal: Risk for activity intolerance will decrease Outcome: Progressing   Problem: Nutrition: Goal: Adequate nutrition will be maintained Outcome: Progressing   Problem: Coping: Goal: Level of anxiety will decrease Outcome: Progressing   Problem: Elimination: Goal: Will not experience complications related to bowel motility Outcome: Progressing Goal: Will not experience complications related to urinary retention Outcome: Progressing   Problem: Pain  Management: Goal: General experience of comfort will improve Outcome: Progressing   Problem: Safety: Goal: Ability to remain free from injury will improve Outcome: Progressing   Problem: Skin Integrity: Goal: Risk for impaired skin integrity will decrease Outcome: Progressing

## 2023-07-13 NOTE — TOC Progression Note (Signed)
Transition of Care [facility]) - Progression Note    Patient Details  Name: Charlotte Harris MRN: [id number] Date of Birth: November 16, 1931  [facility] CM/SW Contact  Baldemar Lenis, [place] Phone Number: 07/13/2023, 3:30 PM  Clinical Narrative:   CSW met with patient and daughter, Deeann Saint, at bedside per request. Celie wanted to ensure that CSW had information on discharge plans from ICU transfer, and CSW reassured patient and daughter that we would just continue the plan for [facility]. CSW explained barrier of feeding tube, and encouraged daughter to ask MD questions about how to make progress for removing the tube. CSW answered other questions about discharge to SNF. Daughter going to [facility] this afternoon to discuss financial paperwork.  CSW spoke with Florentina Addison at [facility] to provide update on patient transfer. [facility] to review the patient's information after cortrak has been removed. CSW updated by MD and daughter that cortrak is being removed today. CSW completed referral and sent to [facility] for review. CSW to follow.    Expected Discharge Plan: Skilled Nursing Facility Barriers to Discharge: Continued Medical Work up, SNF Pending bed offer, English as a second language teacher  Expected Discharge Plan and Services In-house Referral: Clinical Social Work   Post Acute Care Choice: Skilled Nursing Facility Living arrangements for the past 2 months: Apartment                                       Social Determinants of Health (SDOH) Interventions SDOH Screenings   Food Insecurity: No Food Insecurity (07/10/2023)  Housing: Patient Declined (07/10/2023)  Transportation Needs: No Transportation Needs (07/10/2023)  Utilities: Not At Risk (07/10/2023)  Alcohol Screen: Low Risk  (03/15/2020)  Depression (PHQ2-9): Low Risk  (05/28/2023)  Financial Resource Strain: Low Risk  (05/10/2022)  Physical Activity: Unknown (05/10/2022)  Recent Concern: Physical  Activity - Inactive (05/10/2022)  Social Connections: Unknown (05/10/2022)  Stress: No Stress Concern Present (05/10/2022)  Tobacco Use: Medium Risk (07/08/2023)    Readmission Risk Interventions     No data to display

## 2023-07-13 NOTE — Progress Notes (Signed)
Nutrition Follow-up  DOCUMENTATION CODES:   Severe malnutrition in context of chronic illness, Underweight  INTERVENTION:   -Continue regular diet with encouragement.  -Provide Ensure Enlive po BID, each supplement provides 350 kcal and 20 grams of protein.  -Continue MVI/Minerals-1 Tab daily, thiamine 100 mg daily.  NUTRITION DIAGNOSIS:   Severe Malnutrition related to chronic illness (severe COPD) as evidenced by severe fat depletion, severe muscle depletion.  -ongoing  GOAL:   Patient will meet greater than or equal to 90% of their needs  -progressing  MONITOR:   TF tolerance  REASON FOR ASSESSMENT:   Follow up  ASSESSMENT:   Pt with PMH of lung cancer, breast cancer, HTN, hyperlipidemia, severe COPD on home O2 admitted with R thalamic IPH with IVH.  12/9-MBSS with SLP recommended regular diet with thin liquids 12/10-feeding tube discontinued  Intakes recorded 60% x 1 meal. Feeding tube discontinued today. Patient is please to have feeding tube removed. Tolerates all foods at baseline. Notes appetite is not good as usual. She takes Boost very vanilla twice daily at home. She is willing to try Ensure, family will bring in Boost if she does not care for the former. Weights fluctuating since admit, up 3.6 kg from initial, continue to monitor.  Medications reviewed and include novolog SS Q4 hrs, synthroid, MVI/Minerals-1 Tab daily, Senokot-S, thiamine 100 mg daily.  Labs: CBG 128-237 past 24 hrs  Diet Order:   Diet Order             Diet regular Room service appropriate? Yes; Fluid consistency: Thin  Diet effective now                   EDUCATION NEEDS:   Education needs have been addressed  Skin:  Skin Assessment: Skin Integrity Issues: Skin Integrity Issues:: Stage I Stage I: coccyx  Last BM:  07/12/23, type 7-large  Height:   Ht Readings from Last 1 Encounters:  07/08/23 5\' 5"  (1.651 m)    Weight:   Wt Readings from Last 1 Encounters:   07/12/23 30.7 kg    Ideal Body Weight:     BMI:  Body mass index is 11.26 kg/m.  Estimated Nutritional Needs:   Kcal:  1200-1400  Protein:  55-75 grams  Fluid:  >1.5 L/day    Alvino Chapel, RDLD Clinical Dietitian If unable to reach, please contact "RD Inpatient" secure chat group between 8 am-4 pm daily"

## 2023-07-14 DIAGNOSIS — I610 Nontraumatic intracerebral hemorrhage in hemisphere, subcortical: Secondary | ICD-10-CM | POA: Diagnosis not present

## 2023-07-14 LAB — CBC
HCT: 28.1 % — ABNORMAL LOW (ref 36.0–46.0)
Hemoglobin: 8.5 g/dL — ABNORMAL LOW (ref 12.0–15.0)
MCH: 30.4 pg (ref 26.0–34.0)
MCHC: 30.2 g/dL (ref 30.0–36.0)
MCV: 100.4 fL — ABNORMAL HIGH (ref 80.0–100.0)
Platelets: 315 10*3/uL (ref 150–400)
RBC: 2.8 MIL/uL — ABNORMAL LOW (ref 3.87–5.11)
RDW: 13 % (ref 11.5–15.5)
WBC: 12.9 10*3/uL — ABNORMAL HIGH (ref 4.0–10.5)
nRBC: 0 % (ref 0.0–0.2)

## 2023-07-14 LAB — GLUCOSE, CAPILLARY
Glucose-Capillary: 106 mg/dL — ABNORMAL HIGH (ref 70–99)
Glucose-Capillary: 113 mg/dL — ABNORMAL HIGH (ref 70–99)
Glucose-Capillary: 125 mg/dL — ABNORMAL HIGH (ref 70–99)
Glucose-Capillary: 248 mg/dL — ABNORMAL HIGH (ref 70–99)
Glucose-Capillary: 90 mg/dL (ref 70–99)
Glucose-Capillary: 93 mg/dL (ref 70–99)

## 2023-07-14 LAB — BASIC METABOLIC PANEL
Anion gap: 5 (ref 5–15)
BUN: 24 mg/dL — ABNORMAL HIGH (ref 8–23)
CO2: 36 mmol/L — ABNORMAL HIGH (ref 22–32)
Calcium: 9.2 mg/dL (ref 8.9–10.3)
Chloride: 98 mmol/L (ref 98–111)
Creatinine, Ser: 0.84 mg/dL (ref 0.44–1.00)
GFR, Estimated: >60 mL/min (ref >60)
Glucose, Bld: 138 mg/dL — ABNORMAL HIGH (ref 70–99)
Potassium: 3.8 mmol/L (ref 3.5–5.1)
Sodium: 139 mmol/L (ref 135–145)

## 2023-07-14 MED ORDER — NIFEDIPINE ER OSMOTIC RELEASE 90 MG PO TB24: 90.0000 mg | ORAL_TABLET | Freq: Every day | ORAL | Status: DC

## 2023-07-14 MED ORDER — INFLUENZA VAC A&B SURF ANT ADJ 0.5 ML IM SUSY
0.5000 mL | PREFILLED_SYRINGE | INTRAMUSCULAR | Status: AC
  Administered 2023-07-15: 0.5 mL via INTRAMUSCULAR
  Filled 2023-07-14: qty 0.5

## 2023-07-14 MED ORDER — ONDANSETRON HCL 4 MG/2ML IJ SOLN
4.0000 mg | Freq: Four times a day (QID) | INTRAMUSCULAR | Status: DC | PRN
  Administered 2023-07-14 – 2023-07-15 (×2): 4 mg via INTRAVENOUS
  Filled 2023-07-14 (×2): qty 2

## 2023-07-14 MED ORDER — PRAVASTATIN SODIUM 40 MG PO TABS: 20.0000 mg | ORAL_TABLET | Freq: Every day | ORAL | Status: DC

## 2023-07-14 MED ORDER — PRAVASTATIN SODIUM 40 MG PO TABS: 40.0000 mg | ORAL_TABLET | Freq: Every day | ORAL | Status: DC

## 2023-07-14 NOTE — Plan of Care (Signed)
  Problem: Education: Goal: Knowledge of disease or condition will improve Outcome: Progressing Goal: Knowledge of secondary prevention will improve (MUST DOCUMENT ALL) Outcome: Progressing Goal: Knowledge of patient specific risk factors will improve Loraine Leriche N/A or DELETE if not current risk factor) Outcome: Progressing   Problem: Intracerebral Hemorrhage Tissue Perfusion: Goal: Complications of Intracerebral Hemorrhage will be minimized Outcome: Progressing   Problem: Coping: Goal: Will verbalize positive feelings about self Outcome: Progressing Goal: Will identify appropriate support needs Outcome: Progressing   Problem: Health Behavior/Discharge Planning: Goal: Ability to manage health-related needs will improve Outcome: Progressing Goal: Goals will be collaboratively established with patient/family Outcome: Progressing   Problem: Self-Care: Goal: Ability to participate in self-care as condition permits will improve Outcome: Progressing Goal: Verbalization of feelings and concerns over difficulty with self-care will improve Outcome: Progressing Goal: Ability to communicate needs accurately will improve Outcome: Progressing   Problem: Nutrition: Goal: Risk of aspiration will decrease Outcome: Progressing Goal: Dietary intake will improve Outcome: Progressing   Problem: Education: Goal: Ability to describe self-care measures that may prevent or decrease complications (Diabetes Survival Skills Education) will improve Outcome: Progressing Goal: Individualized Educational Video(s) Outcome: Progressing   Problem: Coping: Goal: Ability to adjust to condition or change in health will improve Outcome: Progressing   Problem: Fluid Volume: Goal: Ability to maintain a balanced intake and output will improve Outcome: Progressing   Problem: Health Behavior/Discharge Planning: Goal: Ability to identify and utilize available resources and services will improve Outcome:  Progressing Goal: Ability to manage health-related needs will improve Outcome: Progressing   Problem: Metabolic: Goal: Ability to maintain appropriate glucose levels will improve Outcome: Progressing   Problem: Nutritional: Goal: Maintenance of adequate nutrition will improve Outcome: Progressing Goal: Progress toward achieving an optimal weight will improve Outcome: Progressing   Problem: Skin Integrity: Goal: Risk for impaired skin integrity will decrease Outcome: Progressing   Problem: Tissue Perfusion: Goal: Adequacy of tissue perfusion will improve Outcome: Progressing   Problem: Education: Goal: Knowledge of General Education information will improve Description: Including pain rating scale, medication(s)/side effects and non-pharmacologic comfort measures Outcome: Progressing   Problem: Health Behavior/Discharge Planning: Goal: Ability to manage health-related needs will improve Outcome: Progressing   Problem: Clinical Measurements: Goal: Ability to maintain clinical measurements within normal limits will improve Outcome: Progressing Goal: Will remain free from infection Outcome: Progressing Goal: Diagnostic test results will improve Outcome: Progressing Goal: Respiratory complications will improve Outcome: Progressing Goal: Cardiovascular complication will be avoided Outcome: Progressing   Problem: Activity: Goal: Risk for activity intolerance will decrease Outcome: Progressing   Problem: Nutrition: Goal: Adequate nutrition will be maintained Outcome: Progressing   Problem: Coping: Goal: Level of anxiety will decrease Outcome: Progressing   Problem: Elimination: Goal: Will not experience complications related to bowel motility Outcome: Progressing Goal: Will not experience complications related to urinary retention Outcome: Progressing   Problem: Pain Management: Goal: General experience of comfort will improve Outcome: Progressing   Problem:  Safety: Goal: Ability to remain free from injury will improve Outcome: Progressing   Problem: Skin Integrity: Goal: Risk for impaired skin integrity will decrease Outcome: Progressing

## 2023-07-14 NOTE — Progress Notes (Signed)
Physical Therapy Treatment Patient Details Name: Charlotte Harris MRN: [id number] DOB: 1931/10/24 Today's Date: 07/14/2023   History of Present Illness Pt is an 89 y.o. female who presented 07/08/23 s/p fall and with L-sided weakness. Pt found to have right thalamic and frontal white matter  intraparenchymal hematomas with intraventricular extension. Hospitalization complicated by acute hypoxic respiratory failure secondary to aspiration pneumonia. PMH: adenocarcinoma of lung, breast cancer, COPD, HLD, HTN, hypothyroidism, OA, osteoporosis    PT Comments  Pt resting in bed on arrival, pleasant and agreeable to session with steady progress towards acute goals. Pt requiring mod A to come to sitting EOB with cues for technique. Increased time spent working on righting and midline sitting with pt able to reach outside BOS to R and return to midline as well as prop sit on R and return to midline. Pt able to stand pivot to recliner with mod A with support at trunk for extension and assist for weight shifting. Current plan remains appropriate to address deficits and maximize functional independence and decrease caregiver burden. Pt continues to benefit from skilled PT services to progress toward functional mobility goals.     If plan is discharge home, recommend the following: Two people to help with walking and/or transfers;A lot of help with bathing/dressing/bathroom;Assistance with cooking/housework;Direct supervision/assist for medications management;Direct supervision/assist for financial management;Assist for transportation   Can travel by private vehicle     No  Equipment Recommendations  BSC/3in1;Wheelchair (measurements PT);Wheelchair cushion (measurements PT);Hospital bed    Recommendations for Other Services       Precautions / Restrictions Precautions Precautions: Fall;Other (comment) Precaution Comments: watch SpO2 & RR (on 4L baseline); Goal SBP < 160 Restrictions Weight Bearing  Restrictions: No     Mobility  Bed Mobility Overal bed mobility: Needs Assistance Bed Mobility: Supine to Sit   Sidelying to sit: Mod assist       General bed mobility comments: cues for best technique. mod A to bring LEs to and off EOB and to eleavte trunk    Transfers Overall transfer level: Needs assistance Equipment used: 1 person hand held assist Transfers: Sit to/from Stand, Bed to chair/wheelchair/BSC Sit to Stand: Mod assist Stand pivot transfers: Mod assist         General transfer comment: face to face assist for standing with truncal support for w/shift and pivot.    Ambulation/Gait               General Gait Details: unable this date   Stairs             Wheelchair Mobility     Tilt Bed    Modified Rankin (Stroke Patients Only) Modified Rankin (Stroke Patients Only) Pre-Morbid Rankin Score: Slight disability Modified Rankin: Severe disability     Balance Overall balance assessment: Needs assistance Sitting-balance support: Feet unsupported, Bilateral upper extremity supported Sitting balance-Leahy Scale: Poor Sitting balance - Comments: moderately heavy list to the left.  pt needed external support and cues for shift weight to R, worked on reaching outside BOS to R to facilitate crossing midline Postural control: Left lateral lean Standing balance support: Bilateral upper extremity supported, During functional activity Standing balance-Leahy Scale: Poor Standing balance comment: Reliant on UE support and modA                            Cognition Arousal: Alert Behavior During Therapy: WFL for tasks assessed/performed Overall Cognitive Status:  (NT formally) Area of  Impairment: Safety/judgement, Memory, Problem solving                     Memory: Decreased short-term memory   Safety/Judgement: Decreased awareness of deficits   Problem Solving: Requires verbal cues          Exercises      General  Comments General comments (skin integrity, edema, etc.): SpO2 stable throughout,HR 110s-120s, labored breathing with mobility      Pertinent Vitals/Pain Pain Assessment Pain Assessment: Faces Faces Pain Scale: Hurts little more Pain Location: generalized discomfort with increased work of breathing Pain Descriptors / Indicators: Discomfort Pain Intervention(s): Monitored during session, Limited activity within patient's tolerance, Repositioned    Home Living                          Prior Function            PT Goals (current goals can now be found in the care plan section) Acute Rehab PT Goals Patient Stated Goal: to return home PT Goal Formulation: With patient/family Time For Goal Achievement: 07/25/23 Progress towards PT goals: Progressing toward goals    Frequency    Min 1X/week      PT Plan      Co-evaluation              AM-PAC PT "6 Clicks" Mobility   Outcome Measure  Help needed turning from your back to your side while in a flat bed without using bedrails?: A Little Help needed moving from lying on your back to sitting on the side of a flat bed without using bedrails?: A Lot Help needed moving to and from a bed to a chair (including a wheelchair)?: A Lot Help needed standing up from a chair using your arms (e.g., wheelchair or bedside chair)?: A Lot Help needed to walk in hospital room?: Total Help needed climbing 3-5 steps with a railing? : Total 6 Click Score: 11    End of Session Equipment Utilized During Treatment: Oxygen Activity Tolerance: Patient limited by fatigue Patient left: in chair;with call bell/phone within reach;with chair alarm set Nurse Communication: Mobility status PT Visit Diagnosis: Other abnormalities of gait and mobility (R26.89);Muscle weakness (generalized) (M62.81);Other symptoms and signs involving the nervous system (R29.898) Hemiplegia - Right/Left: Left Hemiplegia - caused by: Other Nontraumatic  intracranial hemorrhage     Time: 1435-1459 PT Time Calculation (min) (ACUTE ONLY): 24 min  Charges:    $Therapeutic Activity: 23-37 mins PT General Charges $$ ACUTE PT VISIT: 1 Visit                     Tommi Crepeau R. PTA Acute Rehabilitation Services Office: [phone]   Catalina Antigua 07/14/2023, 4:05 PM

## 2023-07-14 NOTE — Progress Notes (Signed)
SLP Cancellation Note  Patient Details Name: YIHAN LEANOS MRN: [id number] DOB: 1932/06/10   Cancelled treatment:       Reason Eval/Treat Not Completed: Fatigue/lethargy limiting ability to participate. Pt sleeping soundly. Was feeling nauseous this am and RN medicated. COrtrak out. Pt otherwise tolerating diet. Respiratory function more stable. No need for further acute SLP given normal swallowing on MBS   Avalene Sealy, [name] 07/14/2023, 9:02 AM

## 2023-07-14 NOTE — Care Management Important Message (Signed)
Important Message  Patient Details  Name: Charlotte Harris MRN: [id number] Date of Birth: 05/29/1932   Important Message Given:  Yes - Medicare IM     Dorena Bodo 07/14/2023, 3:12 PM

## 2023-07-14 NOTE — Progress Notes (Signed)
PROGRESS NOTE Charlotte Harris  JYN:[id number] DOB: 03-06-1932 DOA: 07/08/2023 PCP: Karie Georges, MD  Brief Narrative/Hospital Course: Charlotte Harris is a 89 y.o. female with a history of lung cancer, breast cancer, hyperlipidemia, hypertension, severe COPD, chronic respiratory failure, osteoporosis, hypothyroidism.  Patient presented secondary to a fall with left-sided weakness was found to have evidence of a right thalamic ICH.  Patient was admitted by neurology and admitted to the ICU.  No antiplatelets administered secondary to ICH.  Patient managed with strict blood pressure control with goal systolic pressure of less than 130 to 150 mmHg managed on Cleviprex.  Patient transferred out of the ICU on 12/9.  Hypertension managed with nifedipine, losartan, metoprolol hospitalization complicated by acute on chronic respiratory failure secondary to aspiration pneumonia managed with antibiotics and requirement for NG tube placement which is now removed.    Subjective: Patient seen and examined Resting comfortably alert and oriented Normally lives home alone waiting for rehab   Assessment and Plan: Principal Problem:   ICH (intracerebral hemorrhage) (HCC) Active Problems:   COPD without exacerbation (HCC)   Acute respiratory failure with hypoxia (HCC)   Protein-calorie malnutrition, severe   Aspiration into airway   Pressure injury of skin   Acute on chronic hypoxic respiratory failure (HCC)    ICH right thalamic ICH with IVH Etiology likely hypertensive due to location and risk factors. Patient was managed in ICU, seen by neurology, completed stroke workup CTA head and neck no LVO, MRI unchanged IPH with IV, echo EF 60 to 65% LDL 88 A1c 5.6 UDS negative.  On aspirin 81 prior to admission now no thrombotic due to ICH, awaiting for placement  Dysphagia: Severe malnutrition w/ BMI 11: Patient was getting NG tube feeding MBS obtained 12/9 and tolerating diet removed NG tube  12/10  HTN: Off Celebrex, continue to adjust meds currently on losartan 100 Toprol 25 nifedipine 90, and prns  Cerebral aneurysm bilateral cavernous ICA aneurysm -no intervention at this time follow-up outpatient   T2DM-A1c 5.6 at goal: Continue sliding scale insulin Recent Labs  Lab 07/13/23 1606 07/13/23 2045 07/13/23 2348 07/14/23 0453 07/14/23 0803  GLUCAP 173* 192* 93 125* 113*    HLD-LDL at 88 goal less than 70-continue on increase pravastatin dose from 20 to 40 mg.  Aspiration pneumonia Acute on chronic respiratory with hypoxia secondary to above: On 4 L nasal cannula-weaned down to 2 L.  Continue antibiotics to complete the course continue aspiration precaution  Severe COPD Gold stage IV: Follow-up pulmonary as outpatient continue bronchodilators Pulmicort  History of right upper lobe adenocarcinoma  s/p radiation  Hypothyroidism: Continue Synthroid  Coccyx stage I pressure injury POA Wound care as below Pressure Injury 07/08/23 Coccyx Medial Stage 1 -  Intact skin with non-blanchable redness of a localized area usually over a bony prominence. (Active)  07/08/23 1727  Location: Coccyx  Location Orientation: Medial  Staging: Stage 1 -  Intact skin with non-blanchable redness of a localized area usually over a bony prominence.  Wound Description (Comments):   Present on Admission: Yes  Dressing Type Foam - Lift dressing to assess site every shift 07/13/23 2000   DVT prophylaxis: heparin injection 5,000 Units Start: 07/09/23 1000 SCD's Start: 07/08/23 1552 Code Status:   Code Status: Limited: Do not attempt resuscitation (DNR) -DNR-LIMITED -Do Not Intubate/DNI  Family Communication: plan of care discussed with patient at bedside. Patient status is: Remains hospitalized because of severity of illness Level of care: Progressive   Dispo: The  patient is from: home alone            Anticipated disposition: awaiting rehab  Objective: Vitals last 24 hrs: Vitals:    07/13/23 2045 07/13/23 2350 07/14/23 0450 07/14/23 0808  BP: 138/68 139/77 (!) 152/67 (!) 154/77  Pulse: 94 91 100 95  Resp: 12 14  16   Temp: 98.6 F (37 C) 98.7 F (37.1 C) 98.3 F (36.8 C) 98.6 F (37 C)  TempSrc: Oral Oral Oral Oral  SpO2: 98% 99% 98% 99%  Weight:      Height:       Weight change:   Physical Examination: General exam: alert awake,at baseline, older than stated age HEENT:Oral mucosa moist, Ear/Nose WNL grossly Respiratory system: Bilaterally clear BS,no use of accessory muscle Cardiovascular system: S1 & S2 +, No JVD. Gastrointestinal system: Abdomen soft,NT,ND, BS+ Nervous System: Alert, awake, moving all extremities,and following commands. Extremities: LE edema neg,distal peripheral pulses palpable and warm.  Skin: No rashes,no icterus. MSK: Normal muscle bulk,tone, power   Medications reviewed:  Scheduled Meds:  budesonide (PULMICORT) nebulizer solution  0.5 mg Nebulization BID   Chlorhexidine Gluconate Cloth  6 each Topical Daily   feeding supplement  237 mL Oral BID BM   heparin injection (subcutaneous)  5,000 Units Subcutaneous Q12H   [START ON 07/15/2023] influenza vaccine adjuvanted  0.5 mL Intramuscular Tomorrow-1000   insulin aspart  0-9 Units Subcutaneous TID WC   levothyroxine  50 mcg Per Tube Q0600   losartan  100 mg Per Tube Daily   metoprolol succinate  25 mg Oral Daily   multivitamin with minerals  1 tablet Per Tube Daily   NIFEdipine  90 mg Oral Daily   pravastatin  40 mg Per Tube q1800   senna-docusate  1 tablet Per Tube BID   thiamine  100 mg Per Tube Daily   Continuous Infusions:   Diet Order             Diet regular Room service appropriate? Yes; Fluid consistency: Thin  Diet effective now                    Intake/Output Summary (Last 24 hours) at 07/14/2023 1130 Last data filed at 07/14/2023 0519 Gross per 24 hour  Intake --  Output 1700 ml  Net -1700 ml   Net IO Since Admission: -601.43 mL [07/14/23 1130]   Wt Readings from Last 3 Encounters:  07/12/23 30.7 kg  05/28/23 47.2 kg  09/18/22 47.9 kg     Unresulted Labs (From admission, onward)     Start     Ordered   07/10/23 0500  CBC  Daily,   R     Question:  Specimen collection method  Answer:  Lab=Lab collect   07/09/23 1049          Data Reviewed: I have personally reviewed following labs and imaging studies CBC: Recent Labs  Lab 07/08/23 1518 07/08/23 1538 07/10/23 0507 07/11/23 0447 07/12/23 0421 07/13/23 0447 07/14/23 0539  WBC 12.2*  --  17.0* 19.7* 17.8* 14.0* 12.9*  NEUTROABS 10.9*  --   --   --   --   --   --   HGB 9.7*   < > 9.0* 8.3* 8.9* 8.3* 8.5*  HCT 32.7*   < > 28.1* 26.7* 28.7* 27.0* 28.1*  MCV 100.6*  --  97.2 98.5 100.3* 99.3 100.4*  PLT 262  --  305 298 316 322 315   < > = values in this  interval not displayed.   Basic Metabolic Panel:  Recent Labs  Lab 07/09/23 1728 07/10/23 0507 07/10/23 1705 07/11/23 0447 07/12/23 0421 07/13/23 0447 07/14/23 0539  NA  --  140  --  141 143 141 139  K  --  3.5  --  3.8 3.8 4.0 3.8  CL  --  92*  --  94* 96* 96* 98  CO2  --  37*  --  37* 39* 32 36*  GLUCOSE  --  153*  --  180* 119* 171* 138*  BUN  --  27*  --  31* 34* 35* 24*  CREATININE  --  0.64  --  0.72 0.64 0.64 0.84  CALCIUM  --  10.0  --  9.3 9.2 9.3 9.2  MG 1.9 2.1 2.2 2.2  --   --   --   PHOS 2.8 2.7 3.2 2.5  --   --   --    GFR: Estimated Creatinine Clearance: 22 mL/min (by C-G formula based on SCr of 0.84 mg/dL). Liver Function Tests:  Recent Labs  Lab 07/08/23 1518  AST 41  ALT 16  ALKPHOS 75  BILITOT 0.7  PROT 6.9  ALBUMIN 3.5   No results for input(s): "LIPASE", "AMYLASE" in the last 168 hours. No results for input(s): "AMMONIA" in the last 168 hours. Coagulation Profile:  Recent Labs  Lab 07/08/23 1518  INR 1.0   No results for input(s): "PROBNP" in the last 168 hours.  No results for input(s): "HGBA1C" in the last 72 hours. Recent Labs  Lab 07/13/23 1606 07/13/23 2045  07/13/23 2348 07/14/23 0453 07/14/23 0803  GLUCAP 173* 192* 93 125* 113*   No results for input(s): "CHOL", "HDL", "LDLCALC", "TRIG", "CHOLHDL", "LDLDIRECT" in the last 72 hours. No results for input(s): "TSH", "T4TOTAL", "FREET4", "T3FREE", "THYROIDAB" in the last 72 hours. Sepsis Labs: No results for input(s): "PROCALCITON", "LATICACIDVEN" in the last 168 hours. Recent Results (from the past 240 hour(s))  MRSA Next Gen by PCR, Nasal     Status: None   Collection Time: 07/08/23  4:47 PM   Specimen: Nasal Mucosa; Nasal Swab  Result Value Ref Range Status   MRSA by PCR Next Gen NOT DETECTED NOT DETECTED Final    Comment: (NOTE) The GeneXpert MRSA Assay (FDA approved for NASAL specimens only), is one component of a comprehensive MRSA colonization surveillance program. It is not intended to diagnose MRSA infection nor to guide or monitor treatment for MRSA infections. Test performance is not FDA approved in patients less than 5 years old. Performed at [facility], [address], [place], [place] [address]     Antimicrobials/Microbiology: Anti-infectives (From admission, onward)    Start     Dose/Rate Route Frequency Ordered Stop   07/10/23 1045  azithromycin (ZITHROMAX) tablet 500 mg        500 mg Per Tube Daily 07/10/23 0948 07/11/23 0947   07/09/23 1000  cefTRIAXone (ROCEPHIN) 2 g in sodium chloride 0.9 % 100 mL IVPB        2 g 200 mL/hr over 30 Minutes Intravenous Every 24 hours 07/09/23 0847 07/13/23 0943   07/09/23 1000  azithromycin (ZITHROMAX) 500 mg in sodium chloride 0.9 % 250 mL IVPB  Status:  Discontinued        500 mg 250 mL/hr over 60 Minutes Intravenous Every 24 hours 07/09/23 0847 07/10/23 0948      No results found for: "SDES", "SPECREQUEST", "CULT", "REPTSTATUS"   Radiology Studies: DG Swallowing  Func-Speech Pathology  Result Date: 07/12/2023 Table formatting from the original result was not included. Modified Barium Swallow Study Patient Details  Name: Charlotte Harris MRN: [id number] Date of Birth: 22-Dec-1931 Today's Date: 07/12/2023 HPI/PMH: HPI: 89 year old female with prior history of adenocarcinoma of lung, breast cancer, hypertension hyperlipidemia, chronic hypoxic respiratory failure due to severe COPD on home oxygen who presented with right thalamic intraparenchymal hemorrhage with IV. Clinical Impression: Clinical Impression: Pt demonstrates airway protection throughout study, no signs of stroke-related neuromuscular changes to swallow mechanism. Timing WNL, no residue or weakness. Pt does have a prominent cricopharyngeus, neither pill nor solids lodged there. Esophageal sweep showed some distal stasis with puree, pill. Pt is still easily fatigued and was moderately tachypneic during exam though O2 sats remained stable (did not test all textures due to pt fatigue). Recommend pt resume a regular diet and thin liquids. May want to continue cortrak for supplemented nutrition as pts respiratory endurance at meals may be limited. Factors that may increase risk of adverse event in presence of aspiration Rubye Oaks & Clearance Coots 2021): Factors that may increase risk of adverse event in presence of aspiration Rubye Oaks & Clearance Coots 2021): Respiratory or GI disease; Frail or deconditioned Recommendations/Plan: Swallowing Evaluation Recommendations Swallowing Evaluation Recommendations Recommendations: PO diet PO Diet Recommendation: Regular; Thin liquids (Level 0) Liquid Administration via: Cup; Straw Medication Administration: Whole meds with liquid Supervision: Staff to assist with self-feeding Swallowing strategies  : Slow rate; Small bites/sips Postural changes: Position pt fully upright for meals; Stay upright 30-60 min after meals Treatment Plan Treatment Plan Treatment recommendations: Therapy as outlined in treatment plan below Follow-up recommendations: Skilled nursing-short term rehab (<3 hours/day) Treatment frequency: Min 2x/week Treatment duration: 1 week  Interventions: Aspiration precaution training; Patient/family education Recommendations Recommendations for follow up therapy are one component of a multi-disciplinary discharge planning process, led by the attending physician.  Recommendations may be updated based on patient status, additional functional criteria and insurance authorization. Assessment: Orofacial Exam: Orofacial Exam Oral Cavity: Oral Hygiene: WFL Oral Cavity - Dentition: Adequate natural dentition Orofacial Anatomy: WFL Oral Motor/Sensory Function: WFL Anatomy: Anatomy: Other (Comment) (calcification in laryngeal cartilage?) Boluses Administered: No data recorded Oral Impairment Domain: Oral Impairment Domain Lip Closure: No labial escape Tongue control during bolus hold: Not tested Bolus preparation/mastication: Timely and efficient chewing and mashing Bolus transport/lingual motion: Brisk tongue motion Oral residue: Complete oral clearance Location of oral residue : N/A Initiation of pharyngeal swallow : Posterior angle of the ramus  Pharyngeal Impairment Domain: Pharyngeal Impairment Domain Soft palate elevation: No bolus between soft palate (SP)/pharyngeal wall (PW) Laryngeal elevation: Complete superior movement of thyroid cartilage with complete approximation of arytenoids to epiglottic petiole Anterior hyoid excursion: Complete anterior movement Epiglottic movement: Partial inversion Laryngeal vestibule closure: Incomplete, narrow column air/contrast in laryngeal vestibule Pharyngeal stripping wave : Present - complete Tongue base retraction: No contrast between tongue base and posterior pharyngeal wall (PPW) Pharyngeal residue: Complete pharyngeal clearance  Esophageal Impairment Domain: No data recorded Pill: No data recorded Penetration/Aspiration Scale Score: Penetration/Aspiration Scale Score 1.  Material does not enter airway: Thin liquids (Level 0); Puree; Solid Compensatory Strategies: Compensatory Strategies Compensatory  strategies: No   General Information: Caregiver present: No  Diet Prior to this Study: NPO   Temperature : Normal   Respiratory Status: Tachypneic   Supplemental O2: Nasal cannula   History of Recent Intubation: No  Behavior/Cognition: Alert; Cooperative; Pleasant mood Self-Feeding Abilities: Needs assist with self-feeding Baseline vocal quality/speech: Normal Volitional Cough: Able to elicit Volitional  Swallow: Able to elicit Exam Limitations: No limitations Goal Planning: Prognosis for improved oropharyngeal function: Fair Barriers to Reach Goals: Overall medical prognosis No data recorded No data recorded No data recorded Pain: Pain Assessment Pain Assessment: Faces Pain Score: 0 Faces Pain Scale: 4 Pain Location: generalized discomfort with increased work of breathing Pain Descriptors / Indicators: Discomfort Pain Intervention(s): Limited activity within patient's tolerance; Monitored during session; Repositioned End of Session: Start Time:SLP Start Time (ACUTE ONLY): 1240 Stop Time: SLP Stop Time (ACUTE ONLY): 1258 Time Calculation:SLP Time Calculation (min) (ACUTE ONLY): 18 min Charges: SLP Evaluations $ SLP Speech Visit: 1 Visit SLP Evaluations $MBS Swallow: 1 Procedure $Swallowing Treatment: 1 Procedure SLP visit diagnosis: SLP Visit Diagnosis: Dysphagia, unspecified (R13.10) Past Medical History: Past Medical History: Diagnosis Date  ADENOCARCINOMA, LUNG 07/03/2010  RUL  ASYMPTOMATIC POSTMENOPAUSAL STATUS 07/02/2009  Breast cancer (HCC) 1991  R mastectomy, no chemo or radiation  COLONIC POLYPS, HX OF 04/16/2007  COPD 05/17/2008  HEMATURIA, HX OF 04/16/2007  History of radiation therapy 07/30/09 to 08/09/09  RUL lung  HYPERLIPIDEMIA 04/16/2007  Hypertension   HYPERTENSION 04/16/2007  HYPOTHYROIDISM, POST-RADIATION 06/07/2008  Osteoarthritis   OSTEOPOROSIS 07/02/2009  Wrist fracture, left  Past Surgical History: Past Surgical History: Procedure Laterality Date  BREAST SURGERY    lumpectomy  EYE SURGERY Bilateral    cataracts  LUNG BIOPSY  08/31/11  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  LUNG BIOPSY  05/22/2009  RUL lung=Adenocarcinoma  MASTECTOMY  1991  right  TONSILLECTOMY  1955 [place], [name] 07/12/2023, 2:33 PM    LOS: 6 days   Total time spent in review of labs and imaging, patient evaluation, formulation of plan, documentation and communication with family: 35 minutes  Lanae Boast, MD [facility]  07/14/2023, 11:30 AM

## 2023-07-15 DIAGNOSIS — I610 Nontraumatic intracerebral hemorrhage in hemisphere, subcortical: Secondary | ICD-10-CM | POA: Diagnosis not present

## 2023-07-15 LAB — CBC
HCT: 28.1 % — ABNORMAL LOW (ref 36.0–46.0)
Hemoglobin: 8.5 g/dL — ABNORMAL LOW (ref 12.0–15.0)
MCH: 30.4 pg (ref 26.0–34.0)
MCHC: 30.2 g/dL (ref 30.0–36.0)
MCV: 100.4 fL — ABNORMAL HIGH (ref 80.0–100.0)
Platelets: 368 10*3/uL (ref 150–400)
RBC: 2.8 MIL/uL — ABNORMAL LOW (ref 3.87–5.11)
RDW: 12.9 % (ref 11.5–15.5)
WBC: 13.4 10*3/uL — ABNORMAL HIGH (ref 4.0–10.5)
nRBC: 0 % (ref 0.0–0.2)

## 2023-07-15 LAB — GLUCOSE, CAPILLARY
Glucose-Capillary: 98 mg/dL (ref 70–99)
Glucose-Capillary: 174 mg/dL — ABNORMAL HIGH (ref 70–99)
Glucose-Capillary: 120 mg/dL — ABNORMAL HIGH (ref 70–99)
Glucose-Capillary: 159 mg/dL — ABNORMAL HIGH (ref 70–99)

## 2023-07-15 LAB — TROPONIN I (HIGH SENSITIVITY): Troponin I (High Sensitivity): 13 ng/L (ref <18)

## 2023-07-15 MED ORDER — ENSURE ENLIVE PO LIQD
237.0000 mL | Freq: Three times a day (TID) | ORAL | Status: DC
  Administered 2023-07-15 – 2023-07-16 (×2): 237 mL via ORAL

## 2023-07-15 MED ORDER — LOPERAMIDE HCL 1 MG/7.5ML PO SUSP: 2.0000 mg | ORAL | Status: DC | PRN

## 2023-07-15 MED ORDER — ADULT MULTIVITAMIN W/MINERALS CH
1.0000 | ORAL_TABLET | Freq: Every day | ORAL | Status: DC
  Administered 2023-07-15 – 2023-07-16 (×2): 1 via ORAL
  Filled 2023-07-15: qty 1

## 2023-07-15 MED ORDER — SENNOSIDES-DOCUSATE SODIUM 8.6-50 MG PO TABS
1.0000 | ORAL_TABLET | Freq: Two times a day (BID) | ORAL | Status: DC
  Administered 2023-07-15 (×2): 1 via ORAL
  Filled 2023-07-15 (×2): qty 1

## 2023-07-15 MED ORDER — ACETAMINOPHEN 160 MG/5ML PO SOLN: 650.0000 mg | ORAL | Status: DC | PRN

## 2023-07-15 MED ORDER — ACETAMINOPHEN 325 MG PO TABS: 650.0000 mg | ORAL_TABLET | ORAL | Status: DC | PRN

## 2023-07-15 MED ORDER — PRAVASTATIN SODIUM 40 MG PO TABS
40.0000 mg | ORAL_TABLET | Freq: Every day | ORAL | Status: DC
  Administered 2023-07-15: 40 mg via ORAL
  Filled 2023-07-15: qty 1

## 2023-07-15 MED ORDER — ACETAMINOPHEN 650 MG RE SUPP: 650.0000 mg | RECTAL | Status: DC | PRN

## 2023-07-15 MED ORDER — LEVOTHYROXINE SODIUM 50 MCG PO TABS
50.0000 ug | ORAL_TABLET | Freq: Every day | ORAL | Status: DC
  Administered 2023-07-16: 50 ug via ORAL
  Filled 2023-07-15: qty 1

## 2023-07-15 MED ORDER — THIAMINE MONONITRATE 100 MG PO TABS
100.0000 mg | ORAL_TABLET | Freq: Every day | ORAL | Status: AC
  Administered 2023-07-15: 100 mg via ORAL
  Filled 2023-07-15: qty 1

## 2023-07-15 MED ORDER — LOSARTAN POTASSIUM 50 MG PO TABS
100.0000 mg | ORAL_TABLET | Freq: Every day | ORAL | Status: DC
Start: 2023-07-15 — End: 2023-07-16
  Administered 2023-07-15 – 2023-07-16 (×2): 100 mg via ORAL
  Filled 2023-07-15: qty 2

## 2023-07-15 NOTE — Plan of Care (Signed)
  Problem: Education: Goal: Knowledge of disease or condition will improve Outcome: Progressing Goal: Knowledge of secondary prevention will improve (MUST DOCUMENT ALL) Outcome: Progressing Goal: Knowledge of patient specific risk factors will improve Loraine Leriche N/A or DELETE if not current risk factor) Outcome: Progressing   Problem: Intracerebral Hemorrhage Tissue Perfusion: Goal: Complications of Intracerebral Hemorrhage will be minimized Outcome: Progressing   Problem: Coping: Goal: Will verbalize positive feelings about self Outcome: Progressing Goal: Will identify appropriate support needs Outcome: Progressing   Problem: Health Behavior/Discharge Planning: Goal: Ability to manage health-related needs will improve Outcome: Progressing Goal: Goals will be collaboratively established with patient/family Outcome: Progressing   Problem: Self-Care: Goal: Ability to participate in self-care as condition permits will improve Outcome: Progressing Goal: Verbalization of feelings and concerns over difficulty with self-care will improve Outcome: Progressing Goal: Ability to communicate needs accurately will improve Outcome: Progressing   Problem: Nutrition: Goal: Risk of aspiration will decrease Outcome: Progressing Goal: Dietary intake will improve Outcome: Progressing   Problem: Education: Goal: Ability to describe self-care measures that may prevent or decrease complications (Diabetes Survival Skills Education) will improve Outcome: Progressing Goal: Individualized Educational Video(s) Outcome: Progressing   Problem: Coping: Goal: Ability to adjust to condition or change in health will improve Outcome: Progressing   Problem: Fluid Volume: Goal: Ability to maintain a balanced intake and output will improve Outcome: Progressing   Problem: Health Behavior/Discharge Planning: Goal: Ability to identify and utilize available resources and services will improve Outcome:  Progressing Goal: Ability to manage health-related needs will improve Outcome: Progressing   Problem: Metabolic: Goal: Ability to maintain appropriate glucose levels will improve Outcome: Progressing   Problem: Nutritional: Goal: Maintenance of adequate nutrition will improve Outcome: Progressing Goal: Progress toward achieving an optimal weight will improve Outcome: Progressing   Problem: Skin Integrity: Goal: Risk for impaired skin integrity will decrease Outcome: Progressing   Problem: Tissue Perfusion: Goal: Adequacy of tissue perfusion will improve Outcome: Progressing   Problem: Education: Goal: Knowledge of General Education information will improve Description: Including pain rating scale, medication(s)/side effects and non-pharmacologic comfort measures Outcome: Progressing   Problem: Health Behavior/Discharge Planning: Goal: Ability to manage health-related needs will improve Outcome: Progressing   Problem: Clinical Measurements: Goal: Ability to maintain clinical measurements within normal limits will improve Outcome: Progressing Goal: Will remain free from infection Outcome: Progressing Goal: Diagnostic test results will improve Outcome: Progressing Goal: Respiratory complications will improve Outcome: Progressing Goal: Cardiovascular complication will be avoided Outcome: Progressing   Problem: Activity: Goal: Risk for activity intolerance will decrease Outcome: Progressing   Problem: Nutrition: Goal: Adequate nutrition will be maintained Outcome: Progressing   Problem: Coping: Goal: Level of anxiety will decrease Outcome: Progressing   Problem: Elimination: Goal: Will not experience complications related to bowel motility Outcome: Progressing Goal: Will not experience complications related to urinary retention Outcome: Progressing   Problem: Pain Management: Goal: General experience of comfort will improve Outcome: Progressing   Problem:  Safety: Goal: Ability to remain free from injury will improve Outcome: Progressing   Problem: Skin Integrity: Goal: Risk for impaired skin integrity will decrease Outcome: Progressing

## 2023-07-15 NOTE — Progress Notes (Signed)
PROGRESS NOTE Charlotte Harris  ZOX:[id number] DOB: February 26, 1932 DOA: 07/08/2023 PCP: Karie Georges, MD  Brief Narrative/Hospital Course: Charlotte Harris is a 89 y.o. female with a history of lung cancer, breast cancer, hyperlipidemia, hypertension, severe COPD, chronic respiratory failure, osteoporosis, hypothyroidism.  Patient presented secondary to a fall with left-sided weakness was found to have evidence of a right thalamic ICH.  Patient was admitted by neurology and admitted to the ICU.  No antiplatelets administered secondary to ICH.  Patient managed with strict blood pressure control with goal systolic pressure of less than 130 to 150 mmHg managed on Cleviprex.  Patient transferred out of the ICU on 12/9.  Hypertension managed with nifedipine, losartan, metoprolol hospitalization complicated by acute on chronic respiratory failure secondary to aspiration pneumonia managed with antibiotics and requirement for NG tube placement which is now removed.    Subjective: Patient seen and examined this morning  Alert awake resting comfortably denies any chest pain    Assessment and Plan: Principal Problem:   ICH (intracerebral hemorrhage) (HCC) Active Problems:   COPD without exacerbation (HCC)   Acute respiratory failure with hypoxia (HCC)   Protein-calorie malnutrition, severe   Aspiration into airway   Pressure injury of skin   Acute on chronic hypoxic respiratory failure (HCC)   ICH right thalamic ICH with IVH Etiology likely hypertensive due to location and risk factors. Patient was managed in ICU, seen by neurology, completed stroke workup CTA head and neck no LVO, MRI unchanged IPH with IV, echo EF 60 to 65% LDL 88 A1c 5.6 UDS negative.  on aspirin 81 prior to admission now no thrombotic due to ICH, awaiting for placement to SNF at this time  Dysphagia: Severe malnutrition w/ BMI 11: Patient was getting NG tube feeding MBS obtained 12/9 and diet restarted tolerating so far, NG  tube removed 12/10.    HTN: Off Celebrex, continue to adjust meds currently on losartan 100 Toprol 25 nifedipine 90, and prns  Cerebral aneurysm: bilateral cavernous ICA aneurysm -no intervention at this time follow-up outpatient   T2DM-A1c 5.6 at goal: Well-controlled, continue sliding scale insulin Recent Labs  Lab 07/14/23 1230 07/14/23 1604 07/14/23 2146 07/15/23 0600 07/15/23 1102  GLUCAP 248* 106* 90 98 174*    HLD-LDL at 88 goal less than 70-continue on increase pravastatin dose from 20 to 40 mg.  Aspiration pneumonia Acute on chronic respiratory with hypoxia secondary to above: On 4 L nasal cannula-weaned down to 2 L.  Continue antibiotics to complete the course continue aspiration precaution  Severe COPD Gold stage IV: Follow-up pulmonary as outpatient continue bronchodilators Pulmicort  History of right upper lobe adenocarcinoma  s/p radiation  Hypothyroidism: Continue Synthroid  ?ST changes on telemetry but EKG unremarkable, troponin negative  Coccyx stage I pressure injury POA Wound care as below Pressure Injury 07/08/23 Coccyx Medial Stage 1 -  Intact skin with non-blanchable redness of a localized area usually over a bony prominence. (Active)  07/08/23 1727  Location: Coccyx  Location Orientation: Medial  Staging: Stage 1 -  Intact skin with non-blanchable redness of a localized area usually over a bony prominence.  Wound Description (Comments):   Present on Admission: Yes  Dressing Type Foam - Lift dressing to assess site every shift 07/13/23 2000   DVT prophylaxis: heparin injection 5,000 Units Start: 07/09/23 1000 SCD's Start: 07/08/23 1552 Code Status:   Code Status: Limited: Do not attempt resuscitation (DNR) -DNR-LIMITED -Do Not Intubate/DNI  Family Communication: plan of care discussed with patient  at bedside. Patient status is: Remains hospitalized because of severity of illness Level of care: Progressive   Dispo: The patient is from: home  alone            Anticipated disposition: awaiting rehab  Objective: Vitals last 24 hrs: Vitals:   07/14/23 2059 07/14/23 2300 07/15/23 0719 07/15/23 1101  BP: (!) 127/57 117/69 117/76 (!) 128/57  Pulse: 96 (!) 106 98 92  Resp: 18 17 18 18   Temp: 98.7 F (37.1 C) 99.1 F (37.3 C) 98.4 F (36.9 C) (!) 97.5 F (36.4 C)  TempSrc: Oral Oral Oral Oral  SpO2: 95% 98% 98% 91%  Weight:      Height:       Weight change:   Physical Examination: General exam: alert awake, oriented HEENT:Oral mucosa moist, Ear/Nose WNL grossly Respiratory system: Bilaterally clear BS,no use of accessory muscle Cardiovascular system: S1 & S2 +, No JVD. Gastrointestinal system: Abdomen soft,NT,ND, BS+ Nervous System: Alert, awake, moving all extremities,and following commands. Extremities: LE edema neg,distal peripheral pulses palpable and warm.  Skin: No rashes,no icterus. MSK: Normal muscle bulk,tone, power   Medications reviewed:  Scheduled Meds:  budesonide (PULMICORT) nebulizer solution  0.5 mg Nebulization BID   Chlorhexidine Gluconate Cloth  6 each Topical Daily   feeding supplement  237 mL Oral BID BM   heparin injection (subcutaneous)  5,000 Units Subcutaneous Q12H   insulin aspart  0-9 Units Subcutaneous TID WC   [START ON 07/16/2023] levothyroxine  50 mcg Oral Q0600   losartan  100 mg Oral Daily   metoprolol succinate  25 mg Oral Daily   multivitamin with minerals  1 tablet Oral Daily   NIFEdipine  90 mg Oral Daily   pravastatin  40 mg Oral q1800   senna-docusate  1 tablet Oral BID   Continuous Infusions:   Diet Order             Diet - low sodium heart healthy           Diet regular Room service appropriate? Yes; Fluid consistency: Thin  Diet effective now                    Intake/Output Summary (Last 24 hours) at 07/15/2023 1132 Last data filed at 07/15/2023 1000 Gross per 24 hour  Intake --  Output 200 ml  Net -200 ml   Net IO Since Admission: -801.43 mL  [07/15/23 1132]  Wt Readings from Last 3 Encounters:  07/12/23 30.7 kg  05/28/23 47.2 kg  09/18/22 47.9 kg     Unresulted Labs (From admission, onward)     Start     Ordered   07/10/23 0500  CBC  Daily,   R     Question:  Specimen collection method  Answer:  Lab=Lab collect   07/09/23 1049          Data Reviewed: I have personally reviewed following labs and imaging studies CBC: Recent Labs  Lab 07/08/23 1518 07/08/23 1538 07/11/23 0447 07/12/23 0421 07/13/23 0447 07/14/23 0539 07/15/23 0650  WBC 12.2*   < > 19.7* 17.8* 14.0* 12.9* 13.4*  NEUTROABS 10.9*  --   --   --   --   --   --   HGB 9.7*   < > 8.3* 8.9* 8.3* 8.5* 8.5*  HCT 32.7*   < > 26.7* 28.7* 27.0* 28.1* 28.1*  MCV 100.6*   < > 98.5 100.3* 99.3 100.4* 100.4*  PLT 262   < >  298 316 322 315 368   < > = values in this interval not displayed.   Basic Metabolic Panel:  Recent Labs  Lab 07/09/23 1728 07/10/23 0507 07/10/23 1705 07/11/23 0447 07/12/23 0421 07/13/23 0447 07/14/23 0539  NA  --  140  --  141 143 141 139  K  --  3.5  --  3.8 3.8 4.0 3.8  CL  --  92*  --  94* 96* 96* 98  CO2  --  37*  --  37* 39* 32 36*  GLUCOSE  --  153*  --  180* 119* 171* 138*  BUN  --  27*  --  31* 34* 35* 24*  CREATININE  --  0.64  --  0.72 0.64 0.64 0.84  CALCIUM  --  10.0  --  9.3 9.2 9.3 9.2  MG 1.9 2.1 2.2 2.2  --   --   --   PHOS 2.8 2.7 3.2 2.5  --   --   --    GFR: Estimated Creatinine Clearance: 22 mL/min (by C-G formula based on SCr of 0.84 mg/dL). Liver Function Tests:  Recent Labs  Lab 07/08/23 1518  AST 41  ALT 16  ALKPHOS 75  BILITOT 0.7  PROT 6.9  ALBUMIN 3.5   No results for input(s): "LIPASE", "AMYLASE" in the last 168 hours. No results for input(s): "AMMONIA" in the last 168 hours. Coagulation Profile:  Recent Labs  Lab 07/08/23 1518  INR 1.0   No results for input(s): "PROBNP" in the last 168 hours.  No results for input(s): "HGBA1C" in the last 72 hours. Recent Labs  Lab  07/14/23 1230 07/14/23 1604 07/14/23 2146 07/15/23 0600 07/15/23 1102  GLUCAP 248* 106* 90 98 174*   No results for input(s): "CHOL", "HDL", "LDLCALC", "TRIG", "CHOLHDL", "LDLDIRECT" in the last 72 hours. No results for input(s): "TSH", "T4TOTAL", "FREET4", "T3FREE", "THYROIDAB" in the last 72 hours. Sepsis Labs: No results for input(s): "PROCALCITON", "LATICACIDVEN" in the last 168 hours. Recent Results (from the past 240 hours)  MRSA Next Gen by PCR, Nasal     Status: None   Collection Time: 07/08/23  4:47 PM   Specimen: Nasal Mucosa; Nasal Swab  Result Value Ref Range Status   MRSA by PCR Next Gen NOT DETECTED NOT DETECTED Final    Comment: (NOTE) The GeneXpert MRSA Assay (FDA approved for NASAL specimens only), is one component of a comprehensive MRSA colonization surveillance program. It is not intended to diagnose MRSA infection nor to guide or monitor treatment for MRSA infections. Test performance is not FDA approved in patients less than 31 years old. Performed at [facility], [address], [place], [place] [address]     Antimicrobials/Microbiology: Anti-infectives (From admission, onward)    Start     Dose/Rate Route Frequency Ordered Stop   07/10/23 1045  azithromycin (ZITHROMAX) tablet 500 mg        500 mg Per Tube Daily 07/10/23 0948 07/11/23 0947   07/09/23 1000  cefTRIAXone (ROCEPHIN) 2 g in sodium chloride 0.9 % 100 mL IVPB        2 g 200 mL/hr over 30 Minutes Intravenous Every 24 hours 07/09/23 0847 07/13/23 0943   07/09/23 1000  azithromycin (ZITHROMAX) 500 mg in sodium chloride 0.9 % 250 mL IVPB  Status:  Discontinued        500 mg 250 mL/hr over 60 Minutes Intravenous Every 24 hours 07/09/23 0847 07/10/23 0948      No  results found for: "SDES", "SPECREQUEST", "CULT", "REPTSTATUS"   Radiology Studies: No results found.   LOS: 7 days   Total time spent in review of labs and imaging, patient evaluation, formulation of plan, documentation and  communication with family: 35 minutes  Lanae Boast, MD [facility]  07/15/2023, 11:32 AM

## 2023-07-15 NOTE — Progress Notes (Signed)
Nutrition Follow-up  DOCUMENTATION CODES:   Severe malnutrition in context of chronic illness, Underweight  INTERVENTION:   - Continue regular diet and encourage PO intake  - Change to house trays due to pt not ordering meals  - Continue Ensure Enlive po TID, each supplement provides 350 kcal and 20 grams of protein. - Continue MVI/Minerals daily and  thiamine 100 mg daily. - Provide Magic cup TID with meals, each supplement provides 290 kcal and 9 grams of protein  NUTRITION DIAGNOSIS:   Severe Malnutrition related to chronic illness (severe COPD) as evidenced by severe fat depletion, severe muscle depletion.  - still applicable   GOAL:   Patient will meet greater than or equal to 90% of their needs  - Progressing   MONITOR:   TF tolerance  REASON FOR ASSESSMENT:   Consult Enteral/tube feeding initiation and management  ASSESSMENT:   Pt with PMH of lung cancer, breast cancer, HTN, hyperlipidemia, severe COPD on home O2 admitted with R thalamic IPH with IVH.  12/9-MBSS with SLP recommended regular diet with thin liquids 12/9 - Regular diet 12/10-feeding tube discontinued  Pt resting on visit, was able to wake up briefly to answer some questions then went back to sleep. RN or son help pt with eating. RN reports pt has had a poor appetite but has been drinking her Ensures/Boost. Burgess Estelle she reports pt had a muffin and a bowl of potato soup. This morning she had yogurt with fruit and a boost.  Pt did not order lunch, RN ordered tomato soup and grilled cheese for lunch. Placed pt on house trays. Pt was willing to try Magic cups, prefers chocolate.   Pt's usual weight is 107 lbs. Per weight history, pt has been losing weight since November of this year. Pt's weight has fluctuated during admission, but she is still considerably underweight.   Admit weight: 27.1 kg Current weight: 30.7 kg   07/12/23 30.7 kg  05/28/23 47.2 kg  09/18/22 47.9 kg  05/14/22 45.4 kg     Average Meal Intake: 12/10: 60% intake x 1 recorded meals  Nutritionally Relevant Medications: Scheduled Meds:  insulin aspart  0-9 Units Subcutaneous TID WC   [START ON 07/16/2023] levothyroxine  50 mcg Oral Q0600   metoprolol succinate  25 mg Oral Daily   multivitamin with minerals  1 tablet Oral Daily   senna-docusate  1 tablet Oral BID   thiamine  100 mg Oral Daily    Labs Reviewed: No pertinent labs  CBG ranges from 90-248 mg/dL over the last 24 hours  Diet Order:   Diet Order             Diet regular Room service appropriate? Yes with Assist; Fluid consistency: Thin  Diet effective now           Diet - low sodium heart healthy                   EDUCATION NEEDS:   Education needs have been addressed  Skin:  Skin Assessment: Skin Integrity Issues: Skin Integrity Issues:: Stage I Stage I: coccyx  Last BM:  07/12/2023, type 7, large  Height:   Ht Readings from Last 1 Encounters:  07/08/23 5\' 5"  (1.651 m)    Weight:   Wt Readings from Last 1 Encounters:  07/12/23 30.7 kg    Ideal Body Weight:  56.82 kg  BMI:  Body mass index is 11.26 kg/m.  Estimated Nutritional Needs:   Kcal:  1200-1400  Protein:  55-75 grams  Fluid:  >1.5 L/day   Elliot Dally, RD Registered Dietitian  See Amion for more information

## 2023-07-16 DIAGNOSIS — I610 Nontraumatic intracerebral hemorrhage in hemisphere, subcortical: Secondary | ICD-10-CM | POA: Diagnosis not present

## 2023-07-16 DIAGNOSIS — I69854 Hemiplegia and hemiparesis following other cerebrovascular disease affecting left non-dominant side: Secondary | ICD-10-CM | POA: Diagnosis not present

## 2023-07-16 DIAGNOSIS — R29898 Other symptoms and signs involving the musculoskeletal system: Secondary | ICD-10-CM | POA: Diagnosis not present

## 2023-07-16 DIAGNOSIS — M6281 Muscle weakness (generalized): Secondary | ICD-10-CM | POA: Diagnosis not present

## 2023-07-16 DIAGNOSIS — R279 Unspecified lack of coordination: Secondary | ICD-10-CM | POA: Diagnosis not present

## 2023-07-16 LAB — CBC
HCT: 27.5 % — ABNORMAL LOW (ref 36.0–46.0)
Hemoglobin: 8.3 g/dL — ABNORMAL LOW (ref 12.0–15.0)
MCH: 30.5 pg (ref 26.0–34.0)
MCHC: 30.2 g/dL (ref 30.0–36.0)
MCV: 101.1 fL — ABNORMAL HIGH (ref 80.0–100.0)
Platelets: 336 10*3/uL (ref 150–400)
RBC: 2.72 MIL/uL — ABNORMAL LOW (ref 3.87–5.11)
RDW: 12.8 % (ref 11.5–15.5)
WBC: 14.1 10*3/uL — ABNORMAL HIGH (ref 4.0–10.5)
nRBC: 0 % (ref 0.0–0.2)

## 2023-07-16 LAB — GLUCOSE, CAPILLARY
Glucose-Capillary: 105 mg/dL — ABNORMAL HIGH (ref 70–99)
Glucose-Capillary: 197 mg/dL — ABNORMAL HIGH (ref 70–99)

## 2023-07-16 MED ORDER — ENSURE ENLIVE PO LIQD: 237.0000 mL | Freq: Three times a day (TID) | ORAL | Status: DC

## 2023-07-16 MED ORDER — SENNOSIDES-DOCUSATE SODIUM 8.6-50 MG PO TABS: 1.0000 | ORAL_TABLET | Freq: Every evening | ORAL | Status: DC | PRN

## 2023-07-16 NOTE — Progress Notes (Signed)
Occupational Therapy Treatment Patient Details Name: Charlotte Harris MRN: [id number] DOB: 12-Feb-1932 Today's Date: 07/16/2023   History of present illness Pt is an 89 y.o. female who presented 07/08/23 s/p fall and with L-sided weakness. Pt found to have right thalamic and frontal white matter  intraparenchymal hematomas with intraventricular extension. Hospitalization complicated by acute hypoxic respiratory failure secondary to aspiration pneumonia. PMH: adenocarcinoma of lung, breast cancer, COPD, HLD, HTN, hypothyroidism, OA, osteoporosis   OT comments  Pt in bed upon therapy arrival and agreeable to participate in OT treatment session. Session focused on bed mobility, sitting balance and core stability, and sit<>stand transition. Pt continues to have a heavy preference on left lateral lean and intermittent posterior lean requiring increased physical assist to maintain. Frequent rest breaks were necessary throughout session during sitting balance task due to muscle fatigue. Pt reports that plan is to d/c to SNF this afternoon. Son present at end of session and updated on pt's performance during session.       If plan is discharge home, recommend the following:  Two people to help with bathing/dressing/bathroom;Help with stairs or ramp for entrance;Supervision due to cognitive status;Direct supervision/assist for financial management;Direct supervision/assist for medications management;Two people to help with walking and/or transfers   Equipment Recommendations  Other (comment) (defer to next venue of care)       Precautions / Restrictions Precautions Precautions: Fall;Other (comment) Precaution Comments: watch SpO2 & RR (on 4L baseline); Goal SBP < 160 Restrictions Weight Bearing Restrictions Per Provider Order: No       Mobility Bed Mobility Overal bed mobility: Needs Assistance Bed Mobility: Supine to Sit, Rolling, Sit to Supine Rolling: Min assist, Used rails   Supine to  sit: Mod assist, HOB elevated, Used rails Sit to supine: Max assist   General bed mobility comments: VC for sequencing and technique provided. Required more assist transitioning from sit to supine.    Transfers Overall transfer level: Needs assistance Equipment used: 1 person hand held assist Transfers: Sit to/from Stand Sit to Stand: Mod assist, From elevated surface           General transfer comment: face to face assist provided while standing. VC provided for body awareness and to look up over therapist's shoulder to improve standing posture. Left knee blocked to prevent buckling. Pt provided with physical assist to weight shift onto RLE to allow her to abduct LLE to side step towards HOB. LLE knee blocked and assist to weight shift provided once more to allow pt to step RLE in towards LLE.     Balance Overall balance assessment: Needs assistance Sitting-balance support: Feet supported, Bilateral upper extremity supported Sitting balance-Leahy Scale: Zero Sitting balance - Comments: While sitting on EOB, pt focusing on static sitting balance and body awareness with verbal/tactile cues and physical assist to activate core muscles to  maintain sitting balance. Pt demonstrated decreased core stability and endurance while being unable to maintain upright posture for more than 10 seconds at a time. Postural control: Left lateral lean, Posterior lean Standing balance support: Bilateral upper extremity supported Standing balance-Leahy Scale: Zero Standing balance comment: requires external support to maintain        ADL either performed or assessed with clinical judgement      Cognition Arousal: Alert Behavior During Therapy: WFL for tasks assessed/performed Overall Cognitive Status: Impaired/Different from baseline Area of Impairment: Memory, Problem solving, Following commands, Safety/judgement        Memory: Decreased short-term memory Following Commands: Follows one step  commands consistently, Follows one step commands with increased time Safety/Judgement: Decreased awareness of safety, Decreased awareness of deficits   Problem Solving: Slow processing, Requires verbal cues                General Comments SpO2 stable during session    Pertinent Vitals/ Pain       Pain Assessment Pain Assessment: Faces Faces Pain Scale: Hurts a little bit Pain Location: generalized discomfort during mobility and movement Pain Descriptors / Indicators: Discomfort, Grimacing Pain Intervention(s): Limited activity within patient's tolerance, Monitored during session, Repositioned         Frequency  Min 1X/week        Progress Toward Goals  OT Goals(current goals can now be found in the care plan section)  Progress towards OT goals: Progressing toward goals      AM-PAC OT "6 Clicks" Daily Activity     Outcome Measure   Help from another person eating meals?: A Lot Help from another person taking care of personal grooming?: A Lot Help from another person toileting, which includes using toliet, bedpan, or urinal?: Total Help from another person bathing (including washing, rinsing, drying)?: A Lot   Help from another person to put on and taking off regular lower body clothing?: Total 6 Click Score: 8    End of Session Equipment Utilized During Treatment: Oxygen  OT Visit Diagnosis: Unsteadiness on feet (R26.81);Muscle weakness (generalized) (M62.81);Hemiplegia and hemiparesis Hemiplegia - Right/Left: Left Hemiplegia - dominant/non-dominant: Non-Dominant Hemiplegia - caused by: Nontraumatic intracerebral hemorrhage   Activity Tolerance Patient tolerated treatment well   Patient Left in bed;with call bell/phone within reach;with bed alarm set;with family/visitor present           Time: 0981-1914 OT Time Calculation (min): 29 min  Charges: OT General Charges $OT Visit: 1 Visit OT Treatments $Neuromuscular Re-education: 23-37 mins  Limmie Patricia, OTR/L,CBIS  Supplemental OT - [facility] and [facility] Secure Chat Preferred    Mandell Pangborn, Charisse March 07/16/2023, 12:54 PM

## 2023-07-16 NOTE — Plan of Care (Signed)
  Problem: Coping: Goal: Will verbalize positive feelings about self Outcome: Progressing Goal: Will identify appropriate support needs Outcome: Progressing   Problem: Nutrition: Goal: Risk of aspiration will decrease Outcome: Progressing Goal: Dietary intake will improve Outcome: Progressing   Problem: Skin Integrity: Goal: Risk for impaired skin integrity will decrease Outcome: Progressing   Problem: Elimination: Goal: Will not experience complications related to bowel motility Outcome: Progressing Goal: Will not experience complications related to urinary retention Outcome: Progressing   Problem: Safety: Goal: Ability to remain free from injury will improve Outcome: Progressing

## 2023-07-16 NOTE — TOC Progression Note (Signed)
Transition of Care [facility]) - Progression Note    Patient Details  Name: Charlotte Harris MRN: [id number] Date of Birth: Dec 22, 1931  [facility] of [facility]) CM/SW Contact  Baldemar Lenis, [place] Phone Number: 07/16/2023, 10:25 AM  Clinical Narrative:   CSW worked on SNF placement throughout the day today. CSW met with son, Luan Pulling, to discuss concerns about possible admission into [facility] as there's been delays on getting approval. CSW spoke with Admissions at [facility] and they are working on approvals, suggested a possible rehab admission to allow for time to complete approval paperwork.   CSW discussed with family, and looked into other possible options. [facility] is full, [facility] has a waitlist, [facility] has a waitlist, but [facility] would be able to admit on Monday, if needed. Patient and family in agreement.   CSW received a call at the end of the day that [facility] has approved the patient and can admit tomorrow. CSW to follow.    Expected Discharge Plan: Skilled Nursing Facility Barriers to Discharge: Continued Medical Work up, SNF Pending bed offer, Insurance Authorization  Expected Discharge Plan and Services In-house Referral: Clinical Social Work   Post Acute Care Choice: Skilled Nursing Facility Living arrangements for the past 2 months: Apartment Expected Discharge Date: 07/16/23                                     Social Determinants of Health (SDOH) Interventions SDOH Screenings   Food Insecurity: No Food Insecurity (07/10/2023)  Housing: Patient Declined (07/10/2023)  Transportation Needs: No Transportation Needs (07/10/2023)  Utilities: Not At Risk (07/10/2023)  Alcohol Screen: Low Risk  (03/15/2020)  Depression (PHQ2-9): Low Risk  (05/28/2023)  Financial Resource Strain: Low Risk  (05/10/2022)  Physical Activity: Unknown (05/10/2022)  Recent Concern: Physical Activity - Inactive (05/10/2022)  Social Connections: Unknown (05/10/2022)   Stress: No Stress Concern Present (05/10/2022)  Tobacco Use: Medium Risk (07/08/2023)    Readmission Risk Interventions     No data to display

## 2023-07-16 NOTE — TOC Transition Note (Signed)
Transition of Care [facility]) - Discharge Note   Patient Details  Name: Charlotte Harris MRN: [id number] Date of Birth: [birth year]  Transition of Care [facility]) CM/SW Contact:  Baldemar Lenis, LCSW Phone Number: 07/16/2023, 11:39 AM   Clinical Narrative:   CSW updated MD about plan for discharge today, patient is medically stable. CSW confirmed with [facility] that they are ready to admit, requesting transport for 11:30 so that the room can be ready for patient. Son, Luan Pulling, updated and in agreement. Transport arranged with [facility] for 11:30.  Nurse to call report to [phone], [phone].    Final next level of care: Skilled Nursing Facility Barriers to Discharge: Barriers Resolved   Patient Goals and CMS Choice Patient states their goals for this hospitalization and ongoing recovery are:: Rehab CMS Medicare.gov Compare Post Acute Care list provided to:: Patient Represenative (must comment) Choice offered to / list presented to : Adult Children [place] ownership interest in [facility].provided to:: Adult Children    Discharge Placement              Patient [facility] bed at: [facility] Patient to be transferred to facility by: [facility] Name of family member notified: Rich Patient and family notified of of transfer: 07/16/23  Discharge Plan and Services Additional resources added to the After Visit Summary for   In-house Referral: Clinical Social Work   Post Acute Care Choice: Skilled Nursing Facility                               Social Drivers of Health (SDOH) Interventions SDOH Screenings   Food Insecurity: No Food Insecurity (07/10/2023)  Housing: Patient Declined (07/10/2023)  Transportation Needs: No Transportation Needs (07/10/2023)  Utilities: Not At Risk (07/10/2023)  Alcohol Screen: Low Risk  (03/15/2020)  Depression (PHQ2-9): Low Risk  (05/28/2023)  Financial Resource Strain: Low Risk  (05/10/2022)  Physical Activity: Unknown (05/10/2022)   Recent Concern: Physical Activity - Inactive (05/10/2022)  Social Connections: Unknown (05/10/2022)  Stress: No Stress Concern Present (05/10/2022)  Tobacco Use: Medium Risk (07/08/2023)     Readmission Risk Interventions     No data to display

## 2023-07-16 NOTE — TOC Progression Note (Signed)
Transition of Care [facility]) - Progression Note    Patient Details  Name: Charlotte Harris MRN: [id number] Date of Birth: 1932/06/04  Transition of Care [facility]) CM/SW Contact  Baldemar Lenis, [place] Phone Number: 07/16/2023, 10:15 AM  Clinical Narrative:   CSW met with patient's son, Luan Pulling, and spoke with [facility] about patient's admission. Family has completed financial paperwork, and CSW answered medical questions. Awaiting final approval for admission to [facility]. CSW to follow.    Expected Discharge Plan: Skilled Nursing Facility Barriers to Discharge: Continued Medical Work up, SNF Pending bed offer, Insurance Authorization  Expected Discharge Plan and Services In-house Referral: Clinical Social Work   Post Acute Care Choice: Skilled Nursing Facility Living arrangements for the past 2 months: Apartment Expected Discharge Date: 07/16/23                                     Social Determinants of Health (SDOH) Interventions SDOH Screenings   Food Insecurity: No Food Insecurity (07/10/2023)  Housing: Patient Declined (07/10/2023)  Transportation Needs: No Transportation Needs (07/10/2023)  Utilities: Not At Risk (07/10/2023)  Alcohol Screen: Low Risk  (03/15/2020)  Depression (PHQ2-9): Low Risk  (05/28/2023)  Financial Resource Strain: Low Risk  (05/10/2022)  Physical Activity: Unknown (05/10/2022)  Recent Concern: Physical Activity - Inactive (05/10/2022)  Social Connections: Unknown (05/10/2022)  Stress: No Stress Concern Present (05/10/2022)  Tobacco Use: Medium Risk (07/08/2023)    Readmission Risk Interventions     No data to display

## 2023-07-16 NOTE — Discharge Summary (Signed)
Physician Discharge Summary  Charlotte Harris VHQ:[id number] DOB: Oct 17, 1931 DOA: 07/08/2023  PCP: Karie Georges, MD  Admit date: 07/08/2023 Discharge date: 07/16/2023 Recommendations for Outpatient Follow-up:  Follow up with PCP in 1 weeks-call for appointment Please obtain BMP/CBC in one week  Discharge Dispo: SNF Discharge Condition: Stable Code Status:   Code Status: Limited: Do not attempt resuscitation (DNR) -DNR-LIMITED -Do Not Intubate/DNI  Diet recommendation:  Diet Order             Diet regular Room service appropriate? Yes with Assist; Fluid consistency: Thin  Diet effective now           Diet - low sodium heart healthy                    Brief/Interim Summary: Charlotte Harris is a 89 y.o. female with a history of lung cancer, breast cancer, hyperlipidemia, hypertension, severe COPD, chronic respiratory failure, osteoporosis, hypothyroidism.  Patient presented secondary to a fall with left-sided weakness was found to have evidence of a right thalamic ICH.  Patient was admitted by neurology and admitted to the ICU.  No antiplatelets administered secondary to ICH.  Patient managed with strict blood pressure control with goal systolic pressure of less than 130 to 150 mmHg managed on Cleviprex.  Patient transferred out of the ICU on 12/9.  Hypertension managed with nifedipine, losartan, metoprolol hospitalization complicated by acute on chronic respiratory failure secondary to aspiration pneumonia managed with antibiotics and requirement for NG tube placement which is now removed. At this time she is tolerating diet mentating well alert awake, she is deconditioned weak and will need extensive rehabilitation and is being discharged to skilled nursing facility 07/16/23    Discharge Diagnoses:  Principal Problem:   ICH (intracerebral hemorrhage) (HCC) Active Problems:   COPD without exacerbation (HCC)   Acute respiratory failure with hypoxia (HCC)   Protein-calorie  malnutrition, severe   Aspiration into airway   Pressure injury of skin   Acute on chronic hypoxic respiratory failure (HCC)   ICH right thalamic ICH with IVH Etiology likely hypertensive due to location and risk factors. Patient was managed in ICU, seen by neurology, completed stroke workup CTA head and neck no LVO, MRI unchanged IPH with IV, echo EF 60 to 65% LDL 88 A1c 5.6 UDS negative.  on aspirin 81 prior to admission now no thrombotic due to ICH, awaiting for placement to SNF at this time  Dysphagia: Severe malnutrition w/ BMI 11: Patient was getting NG tube feeding MBS obtained 12/9 and diet restarted tolerating so far, NG tube removed 12/10.  Tolerating diet  HTN: Off Celebrex, continue to adjust meds currently on losartan 100 Toprol 25 nifedipine 90, and prns  Cerebral aneurysm: bilateral cavernous ICA aneurysm -no intervention at this time follow-up outpatient   T2DM-A1c 5.6 at goal: Well-controlled cbg on ssi here Recent Labs  Lab 07/15/23 0600 07/15/23 1102 07/15/23 1611 07/15/23 2101 07/16/23 0620  GLUCAP 98 174* 120* 159* 105*    HLD-LDL at 88 goal less than 70-continue on increase pravastatin dose from 20 to 40 mg.  Aspiration pneumonia Acute on chronic respiratory with hypoxia secondary to above: On 4 L nasal cannula-weaned down to 2 L.  Completed antibiotics course, continue aspiration precaution  Severe COPD Gold stage IV: Follow-up pulmonary as outpatient continue bronchodilators Pulmicort  History of right upper lobe adenocarcinoma  s/p radiation  Hypothyroidism: Continue Synthroid  ?ST changes on telemetry but EKG unremarkable, troponin negative  Coccyx stage  I pressure injury POA Wound care as below Pressure Injury 07/08/23 Coccyx Medial Stage 1 -  Intact skin with non-blanchable redness of a localized area usually over a bony prominence. (Active)  07/08/23 1727  Location: Coccyx  Location Orientation: Medial  Staging: Stage 1 -  Intact skin  with non-blanchable redness of a localized area usually over a bony prominence.  Wound Description (Comments):   Present on Admission: Yes  Dressing Type Foam - Lift dressing to assess site every shift 07/13/23 2000    Consults: Critical care Neurology Subjective: Alert awake oriented eager to go to rehab today  Discharge Exam: Vitals:   07/16/23 0415 07/16/23 0802  BP: (!) 106/51 (!) 119/59  Pulse: 86 94  Resp: 17 16  Temp: 98.2 F (36.8 C) 98.6 F (37 C)  SpO2: 98% 99%   General: Pt is alert, awake, not in acute distress Cardiovascular: RRR, S1/S2 +, no rubs, no gallops Respiratory: CTA bilaterally, no wheezing, no rhonchi Abdominal: Soft, NT, ND, bowel sounds + Extremities: no edema, no cyanosis  Discharge Instructions  Discharge Instructions     Ambulatory referral to Neurology   Complete by: As directed    Follow up with stroke clinic NP (Jessica Vanschaick or Darrol Angel, if both not available, consider Manson Allan, or Ahern) at [facility] in about 4-6 weeks. Thanks.   Diet - low sodium heart healthy   Complete by: As directed    Discharge instructions   Complete by: As directed    Please call call MD or return to ER for similar or worsening recurring problem that brought you to hospital or if any fever,nausea/vomiting,abdominal pain, uncontrolled pain, chest pain,  shortness of breath or any other alarming symptoms.  Please follow-up your doctor as instructed in a week time and call the office for appointment.  Please avoid alcohol, smoking, or any other illicit substance and maintain healthy habits including taking your regular medications as prescribed.  You were cared for by a hospitalist during your hospital stay. If you have any questions about your discharge medications or the care you received while you were in the hospital after you are discharged, you can call the unit and ask to speak with the hospitalist on call if the hospitalist that took care of you  is not available.  Once you are discharged, your primary care physician will handle any further medical issues. Please note that NO REFILLS for any discharge medications will be authorized once you are discharged, as it is imperative that you return to your primary care physician (or establish a relationship with a primary care physician if you do not have one) for your aftercare needs so that they can reassess your need for medications and monitor your lab values   Discharge instructions   Complete by: As directed    Avoid NSAIDs aspirin Please call call MD or return to ER for similar or worsening recurring problem that brought you to hospital or if any fever,nausea/vomiting,abdominal pain, uncontrolled pain, chest pain,  shortness of breath or any other alarming symptoms.  Please follow-up your doctor as instructed in a week time and call the office for appointment.  Please avoid alcohol, smoking, or any other illicit substance and maintain healthy habits including taking your regular medications as prescribed.  You were cared for by a hospitalist during your hospital stay. If you have any questions about your discharge medications or the care you received while you were in the hospital after you are discharged, you can  call the unit and ask to speak with the hospitalist on call if the hospitalist that took care of you is not available.  Once you are discharged, your primary care physician will handle any further medical issues. Please note that NO REFILLS for any discharge medications will be authorized once you are discharged, as it is imperative that you return to your primary care physician (or establish a relationship with a primary care physician if you do not have one) for your aftercare needs so that they can reassess your need for medications and monitor your lab values   Discharge wound care:   Complete by: As directed    ntact skin with non-blanchable redness of a localized area usually  over a bony prominence.   Increase activity slowly   Complete by: As directed    No wound care   Complete by: As directed       Allergies as of 07/16/2023       Reactions   Crestor [rosuvastatin Calcium]    cramping        Medication List     STOP taking these medications    aspirin EC 81 MG tablet       TAKE these medications    acetaminophen 500 MG tablet Commonly known as: TYLENOL Take 500 mg by mouth every 6 (six) hours as needed for headache (pain).   albuterol 108 (90 Base) MCG/ACT inhaler Commonly known as: VENTOLIN HFA Inhale 2 puffs into the lungs every 6 (six) hours as needed for wheezing or shortness of breath.   budesonide 0.25 MG/2ML nebulizer solution Commonly known as: PULMICORT USE (0.25 MG TOTAL) BY NEBULIZATION TWICE A DAY.   CALCIUM 1000 + D PO Take 2 tablets by mouth daily.   feeding supplement Liqd Take 237 mLs by mouth 3 (three) times daily after meals.   FERROUS SULFATE PO Take 1 tablet by mouth daily.   levothyroxine 50 MCG tablet Commonly known as: SYNTHROID TAKE 1 TABLET BY MOUTH EVERY DAY BEFORE BREAKFAST   losartan 100 MG tablet Commonly known as: COZAAR Take 1 tablet (100 mg total) by mouth daily.   metFORMIN 500 MG 24 hr tablet Commonly known as: GLUCOPHAGE-XR Take 1 tablet (500 mg total) by mouth daily.   metoprolol succinate 25 MG 24 hr tablet Commonly known as: TOPROL-XL Take 1 tablet (25 mg total) by mouth daily.   Nebulizer Compressor Kit PLEASE DISPENSE 1 NEBULIZER MACHINE AND SUPPLIES DX: J44.9   NIFEdipine 90 MG 24 hr tablet Commonly known as: PROCARDIA XL/NIFEDICAL-XL Take 1 tablet (90 mg total) by mouth daily. What changed:  medication strength how much to take   pravastatin 40 MG tablet Commonly known as: PRAVACHOL Place 1 tablet (40 mg total) into feeding tube daily at 6 PM. What changed:  medication strength how much to take how to take this when to take this   senna-docusate 8.6-50 MG  tablet Commonly known as: Senokot-S Take 1 tablet by mouth at bedtime as needed for mild constipation.   Stiolto Respimat 2.5-2.5 MCG/ACT Aers Generic drug: Tiotropium Bromide-Olodaterol 2 puffs once daily What changed:  how much to take how to take this when to take this additional instructions   vitamin B-12 100 MCG tablet Commonly known as: CYANOCOBALAMIN Take 100 mcg by mouth daily.               Discharge Care Instructions  (From admission, onward)           Start  Ordered   07/14/23 0000  Discharge wound care:       Comments: ntact skin with non-blanchable redness of a localized area usually over a bony prominence.   07/14/23 1134            Follow-up Information     [facility]. Schedule an appointment as soon as possible for a visit in 1 month(s).   Specialty: Neurology Why: stroke clinic Contact information: [address] [place] [address] [phone]        Karie Georges, MD Follow up in 1 week(s).   Specialty: Family Medicine Contact information: [address] [place] [address] [phone]         Karie Georges, MD Follow up in 1 week(s).   Specialty: Family Medicine Contact information: [address] [place] [address] [phone]                Allergies  Allergen Reactions   Crestor [Rosuvastatin Calcium]     cramping    The results of significant diagnostics from this hospitalization (including imaging, microbiology, ancillary and laboratory) are listed below for reference.    Microbiology: Recent Results (from the past 240 hours)  MRSA Next Gen by PCR, Nasal     Status: None   Collection Time: 07/08/23  4:47 PM   Specimen: Nasal Mucosa; Nasal Swab  Result Value Ref Range Status   MRSA by PCR Next Gen NOT DETECTED NOT DETECTED Final    Comment: (NOTE) The GeneXpert MRSA Assay (FDA approved for NASAL specimens  only), is one component of a comprehensive MRSA colonization surveillance program. It is not intended to diagnose MRSA infection nor to guide or monitor treatment for MRSA infections. Test performance is not FDA approved in patients less than 23 years old. Performed at [facility], [address] [place], [place] [address]     Procedures/Studies: DG Swallowing Func-Speech Pathology Result Date: 07/12/2023 Table formatting from the original result was not included. Modified Barium Swallow Study Patient Details Name: Charlotte Harris MRN: [id number] Date of Birth: September 14, 1931 Today's Date: 07/12/2023 HPI/PMH: HPI: 89 year old female with prior history of adenocarcinoma of lung, breast cancer, hypertension hyperlipidemia, chronic hypoxic respiratory failure due to severe COPD on home oxygen who presented with right thalamic intraparenchymal hemorrhage with IV. Clinical Impression: Clinical Impression: Pt demonstrates airway protection throughout study, no signs of stroke-related neuromuscular changes to swallow mechanism. Timing WNL, no residue or weakness. Pt does have a prominent cricopharyngeus, neither pill nor solids lodged there. Esophageal sweep showed some distal stasis with puree, pill. Pt is still easily fatigued and was moderately tachypneic during exam though O2 sats remained stable (did not test all textures due to pt fatigue). Recommend pt resume a regular diet and thin liquids. May want to continue cortrak for supplemented nutrition as pts respiratory endurance at meals may be limited. Factors that may increase risk of adverse event in presence of aspiration Rubye Oaks & Clearance Coots 2021): Factors that may increase risk of adverse event in presence of aspiration Rubye Oaks & Clearance Coots 2021): Respiratory or GI disease; Frail or deconditioned Recommendations/Plan: Swallowing Evaluation Recommendations Swallowing Evaluation Recommendations Recommendations: PO diet PO Diet Recommendation: Regular; Thin  liquids (Level 0) Liquid Administration via: Cup; Straw Medication Administration: Whole meds with liquid Supervision: Staff to assist with self-feeding Swallowing strategies  : Slow rate; Small bites/sips Postural changes: Position pt fully upright for meals; Stay upright 30-60 min after meals Treatment Plan Treatment  Plan Treatment recommendations: Therapy as outlined in treatment plan below Follow-up recommendations: Skilled nursing-short term rehab (<3 hours/day) Treatment frequency: Min 2x/week Treatment duration: 1 week Interventions: Aspiration precaution training; Patient/family education Recommendations Recommendations for follow up therapy are one component of a multi-disciplinary discharge planning process, led by the attending physician.  Recommendations may be updated based on patient status, additional functional criteria and insurance authorization. Assessment: Orofacial Exam: Orofacial Exam Oral Cavity: Oral Hygiene: WFL Oral Cavity - Dentition: Adequate natural dentition Orofacial Anatomy: WFL Oral Motor/Sensory Function: WFL Anatomy: Anatomy: Other (Comment) (calcification in laryngeal cartilage?) Boluses Administered: No data recorded Oral Impairment Domain: Oral Impairment Domain Lip Closure: No labial escape Tongue control during bolus hold: Not tested Bolus preparation/mastication: Timely and efficient chewing and mashing Bolus transport/lingual motion: Brisk tongue motion Oral residue: Complete oral clearance Location of oral residue : N/A Initiation of pharyngeal swallow : Posterior angle of the ramus  Pharyngeal Impairment Domain: Pharyngeal Impairment Domain Soft palate elevation: No bolus between soft palate (SP)/pharyngeal wall (PW) Laryngeal elevation: Complete superior movement of thyroid cartilage with complete approximation of arytenoids to epiglottic petiole Anterior hyoid excursion: Complete anterior movement Epiglottic movement: Partial inversion Laryngeal vestibule closure:  Incomplete, narrow column air/contrast in laryngeal vestibule Pharyngeal stripping wave : Present - complete Tongue base retraction: No contrast between tongue base and posterior pharyngeal wall (PPW) Pharyngeal residue: Complete pharyngeal clearance  Esophageal Impairment Domain: No data recorded Pill: No data recorded Penetration/Aspiration Scale Score: Penetration/Aspiration Scale Score 1.  Material does not enter airway: Thin liquids (Level 0); Puree; Solid Compensatory Strategies: Compensatory Strategies Compensatory strategies: No   General Information: Caregiver present: No  Diet Prior to this Study: NPO   Temperature : Normal   Respiratory Status: Tachypneic   Supplemental O2: Nasal cannula   History of Recent Intubation: No  Behavior/Cognition: Alert; Cooperative; Pleasant mood Self-Feeding Abilities: Needs assist with self-feeding Baseline vocal quality/speech: Normal Volitional Cough: Able to elicit Volitional Swallow: Able to elicit Exam Limitations: No limitations Goal Planning: Prognosis for improved oropharyngeal function: Fair Barriers to Reach Goals: Overall medical prognosis No data recorded No data recorded No data recorded Pain: Pain Assessment Pain Assessment: Faces Pain Score: 0 Faces Pain Scale: 4 Pain Location: generalized discomfort with increased work of breathing Pain Descriptors / Indicators: Discomfort Pain Intervention(s): Limited activity within patient's tolerance; Monitored during session; Repositioned End of Session: Start Time:SLP Start Time (ACUTE ONLY): 1240 Stop Time: SLP Stop Time (ACUTE ONLY): 1258 Time Calculation:SLP Time Calculation (min) (ACUTE ONLY): 18 min Charges: SLP Evaluations $ SLP Speech Visit: 1 Visit SLP Evaluations $MBS Swallow: 1 Procedure $Swallowing Treatment: 1 Procedure SLP visit diagnosis: SLP Visit Diagnosis: Dysphagia, unspecified (R13.10) Past Medical History: Past Medical History: Diagnosis Date  ADENOCARCINOMA, LUNG 07/03/2010  RUL  ASYMPTOMATIC  POSTMENOPAUSAL STATUS 07/02/2009  Breast cancer (HCC) 1991  R mastectomy, no chemo or radiation  COLONIC POLYPS, HX OF 04/16/2007  COPD 05/17/2008  HEMATURIA, HX OF 04/16/2007  History of radiation therapy 07/30/09 to 08/09/09  RUL lung  HYPERLIPIDEMIA 04/16/2007  Hypertension   HYPERTENSION 04/16/2007  HYPOTHYROIDISM, POST-RADIATION 06/07/2008  Osteoarthritis   OSTEOPOROSIS 07/02/2009  Wrist fracture, left  Past Surgical History: Past Surgical History: Procedure Laterality Date  BREAST SURGERY    lumpectomy  EYE SURGERY Bilateral   cataracts  LUNG BIOPSY  08/31/11  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  LUNG BIOPSY  05/22/2009  RUL lung=Adenocarcinoma  MASTECTOMY  1991  right  TONSILLECTOMY  1955 [place], [name] 07/12/2023, 2:33 PM  DG  Abd Portable 1V Result Date: 07/09/2023 CLINICAL DATA:  Feeding tube placement EXAM: PORTABLE ABDOMEN - 1 VIEW COMPARISON:  None Available. FINDINGS: Enteric tube tip overlies the mid stomach. Some air distended bowel in the upper abdomen IMPRESSION: Enteric tube tip overlies the mid stomach. Electronically Signed   By: Jasmine Pang M.D.   On: 07/09/2023 16:52   ECHOCARDIOGRAM COMPLETE Result Date: 07/09/2023    ECHOCARDIOGRAM REPORT   Patient Name:   Charlotte Harris Date of Exam: 07/09/2023 Medical Rec #:  [id number]          Height:       65.0 in Accession #:    [id number]         Weight:       59.7 lb Date of Birth:  [birth year]          BSA:          1.184 m Patient Age:    89 years           BP:           128/64 mmHg Patient Gender: F                  HR:           116 bpm. Exam Location:  Inpatient Procedure: 2D Echo, Cardiac Doppler and Color Doppler Indications:     Stroke I63.9  History:         Patient has prior history of Echocardiogram examinations, most                  recent 07/19/2018. COPD; Risk Factors:Former Smoker, Diabetes                  and Hypertension.  Sonographer:     Dondra Prader RVT RCS Referring Phys:  [id number] [facility] Diagnosing Phys: [facility]  Sonographer Comments: Technically challenging study due to limited acoustic windows and Technically difficult study due to poor echo windows. HR from 110-130's IMPRESSIONS  1. Left ventricular ejection fraction, by estimation, is 60 to 65%. The left ventricle has normal function. The left ventricle has no regional wall motion abnormalities. There is mild left ventricular hypertrophy. Indeterminate diastolic filling due to E-A fusion.  2. Right ventricular systolic function is normal. The right ventricular size is normal. There is moderately elevated pulmonary artery systolic pressure. The estimated right ventricular systolic pressure is 48.2 mmHg.  3. The mitral valve is myxomatous. No evidence of mitral valve regurgitation.  4. No significant AS. There is moderate calcification of the aortic valve. Aortic valve regurgitation is not visualized.  5. The inferior vena cava is dilated in size with >50% respiratory variability, suggesting right atrial pressure of 8 mmHg. Conclusion(s)/Recommendation(s): No intracardiac source of embolism detected on this transthoracic study. Consider a transesophageal echocardiogram to exclude cardiac source of embolism if clinically indicated. FINDINGS  Left Ventricle: Left ventricular ejection fraction, by estimation, is 60 to 65%. The left ventricle has normal function. The left ventricle has no regional wall motion abnormalities. The left ventricular internal cavity size was normal in size. There is  mild left ventricular hypertrophy. Indeterminate diastolic filling due to E-A fusion. Right Ventricle: The right ventricular size is normal. Right ventricular systolic function is normal. There is moderately elevated pulmonary artery systolic pressure. The tricuspid regurgitant velocity is 3.17 m/s, and with an assumed right atrial pressure of 8 mmHg, the estimated right ventricular systolic pressure is 48.2 mmHg. Left Atrium: Left atrial size was  normal in size. Right Atrium:  Right atrial size was normal in size. Pericardium: There is no evidence of pericardial effusion. Mitral Valve: The mitral valve is myxomatous. No evidence of mitral valve regurgitation. Tricuspid Valve: Tricuspid valve regurgitation is mild. Aortic Valve: No significant AS. There is moderate calcification of the aortic valve. Aortic valve regurgitation is not visualized. Aortic valve mean gradient measures 7.4 mmHg. Aortic valve peak gradient measures 13.8 mmHg. Aortic valve area, by VTI measures 1.68 cm. Pulmonic Valve: Pulmonic valve regurgitation is not visualized. Aorta: The aortic root and ascending aorta are structurally normal, with no evidence of dilitation. Venous: The inferior vena cava is dilated in size with greater than 50% respiratory variability, suggesting right atrial pressure of 8 mmHg. IAS/Shunts: No atrial level shunt detected by color flow Doppler.  LEFT VENTRICLE PLAX 2D LVIDd:         3.70 cm   Diastology LVIDs:         2.60 cm   LV e' medial:    7.18 cm/s LV PW:         1.10 cm   LV E/e' medial:  15.6 LV IVS:        0.90 cm   LV e' lateral:   15.60 cm/s LVOT diam:     1.80 cm   LV E/e' lateral: 7.2 LV SV:         46 LV SV Index:   39 LVOT Area:     2.54 cm  RIGHT VENTRICLE             IVC RV Basal diam:  3.00 cm     IVC diam: 2.20 cm RV S prime:     13.90 cm/s TAPSE (M-mode): 2.4 cm LEFT ATRIUM             Index        RIGHT ATRIUM           Index LA diam:        2.50 cm 2.11 cm/m   RA Area:     15.40 cm LA Vol (A2C):   26.8 ml 22.64 ml/m  RA Volume:   41.30 ml  34.89 ml/m LA Vol (A4C):   26.9 ml 22.73 ml/m LA Biplane Vol: 28.5 ml 24.08 ml/m  AORTIC VALVE                     PULMONIC VALVE AV Area (Vmax):    1.85 cm      PV Vmax:       1.34 m/s AV Area (Vmean):   1.87 cm      PV Peak grad:  7.2 mmHg AV Area (VTI):     1.68 cm AV Vmax:           185.60 cm/s AV Vmean:          124.300 cm/s AV VTI:            0.272 m AV Peak Grad:      13.8 mmHg AV Mean Grad:      7.4 mmHg LVOT Vmax:          135.20 cm/s LVOT Vmean:        91.140 cm/s LVOT VTI:          0.179 m LVOT/AV VTI ratio: 0.66  AORTA Ao Root diam: 2.60 cm Ao Asc diam:  3.70 cm MITRAL VALVE                TRICUSPID  VALVE MV Area (PHT): 5.31 cm     TR Peak grad:   40.2 mmHg MV Decel Time: 143 msec     TR Vmax:        317.00 cm/s MV E velocity: 112.00 cm/s MV A velocity: 133.00 cm/s  SHUNTS MV E/A ratio:  0.84         Systemic VTI:  0.18 m                             Systemic Diam: 1.80 cm Carolan Clines Electronically signed by Carolan Clines Signature Date/Time: 07/09/2023/2:20:52 PM    Final (Updated)    CT Angio Chest Pulmonary Embolism (PE) W or WO Contrast Result Date: 07/09/2023 CLINICAL DATA:  89 year old female with intracranial hemorrhage. High probability of pulmonary embolus. EXAM: CT ANGIOGRAPHY CHEST WITH CONTRAST TECHNIQUE: Multidetector CT imaging of the chest was performed using the standard protocol during bolus administration of intravenous contrast. Multiplanar CT image reconstructions and MIPs were obtained to evaluate the vascular anatomy. RADIATION DOSE REDUCTION: This exam was performed according to the departmental dose-optimization program which includes automated exposure control, adjustment of the mA and/or kV according to patient size and/or use of iterative reconstruction technique. CONTRAST:  67mL OMNIPAQUE IOHEXOL 350 MG/ML SOLN COMPARISON:  CTA chest 07/18/2018. FINDINGS: Cardiovascular: Adequate contrast bolus timing in the pulmonary arterial tree. Mild respiratory motion. Some central pulmonary artery enlargement bilaterally. No pulmonary artery filling defect identified. Extensive Calcified aortic atherosclerosis. Extensive calcified coronary artery atherosclerosis and/or stents. Heart size stable since 2019 at the upper limits of normal. No pericardial effusion. Mediastinum/Nodes: No mediastinal mass or lymphadenopathy. Lungs/Pleura: Chronic emphysema. Bulky chronic anterior right lung architectural  distortion, atelectasis or scarring in association with abnormal right anterior 2nd rib there, no change from 2019. Layering secretions in the trachea now series 8, image 48 and widespread patchy and irregular left lung multilobar peribronchial opacity which is most pronounced in the lower lobe. The lobar bronchi are abnormally thickened and/or opacified there. No similar acute changes in the right lung. No pleural effusion. Upper Abdomen: Negative visible noncontrast liver, spleen, stomach. Chronic exophytic right renal simple fluid density cyst (no follow-up imaging recommended). Musculoskeletal: Stable visualized osseous structures. Chronically abnormal but stable right anterior 2nd rib. Underlying osteopenia. Dextroconvex scoliosis. No acute or suspicious osseous lesion identified. Review of the MIP images confirms the above findings. IMPRESSION: 1. Negative for acute pulmonary embolus. 2. Emphysema (ICD10-J43.9). Multilobar acute left lung patchy and irregular peribronchial opacity with abnormally thickened and opacified airways, some retained secretions in the trachea. Top differential considerations are multilobar Aspiration (favored in this setting), Bronchopneumonia. No pleural effusion. 3. Chronic severe but stable right anterior 2nd rib and subjacent right lung parenchymal changes since 2019, compatible with benign or post treatment etiology. 4. Aortic Atherosclerosis (ICD10-I70.0). Coronary artery atherosclerosis. Electronically Signed   By: Odessa Fleming M.D.   On: 07/09/2023 10:42   MR BRAIN W WO CONTRAST Result Date: 07/09/2023 CLINICAL DATA:  Hemorrhagic stroke EXAM: MRI HEAD WITHOUT AND WITH CONTRAST TECHNIQUE: Multiplanar, multiecho pulse sequences of the brain and surrounding structures were obtained without and with intravenous contrast. CONTRAST:  5mL GADAVIST GADOBUTROL 1 MMOL/ML IV SOLN COMPARISON:  Head CT 07/09/2023 FINDINGS: Brain: Unchanged right thalamic and frontal white matter  intraparenchymal hematomas with intraventricular extension. Magnetic susceptibility effects from the blood limit the assessment for acute ischemia on diffusion-weighted imaging. No abnormal diffusion restriction elsewhere within the brain. Chronic microhemorrhage  in the left frontal lobe. There is confluent white matter hyperintense T2-weighted signal consistent with chronic ischemic microangiopathy. Unchanged anterior parafalcine meningioma. There is no abnormal contrast enhancement within the brain. Vascular: Normal flow voids Skull and upper cervical spine: Normal marrow signal. Sinuses/Orbits: Negative. Other: None. IMPRESSION: 1. Unchanged right thalamic and frontal white matter intraparenchymal hematomas with intraventricular extension. 2. No acute intracranial abnormality. 3. Unchanged anterior parafalcine meningioma. Electronically Signed   By: Deatra Robinson M.D.   On: 07/09/2023 04:07   CT HEAD WO CONTRAST ( ) Result Date: 07/09/2023 CLINICAL DATA:  Hemorrhage follow-up EXAM: CT HEAD WITHOUT CONTRAST TECHNIQUE: Contiguous axial images were obtained from the base of the skull through the vertex without intravenous contrast. RADIATION DOSE REDUCTION: This exam was performed according to the departmental dose-optimization program which includes automated exposure control, adjustment of the mA and/or kV according to patient size and/or use of iterative reconstruction technique. COMPARISON:  07/08/2023 head CT FINDINGS: Brain: Unchanged multifocal hemorrhage with intraparenchymal hematomas in the right frontal white matter and right thalamus, extending into the right lateral ventricle. Left anterior para falcine meningioma is unchanged. Size and configuration of the ventricles is unchanged. There is mass effect on the right lateral ventricle but no midline shift. Vascular: Carotid and vertebral artery atherosclerosis at the skull base. Skull: Negative Sinuses/Orbits: Paranasal sinuses are clear. No mastoid  effusion. Normal orbits. Other: None. IMPRESSION: 1. Unchanged intraparenchymal hematomas in the right frontal white matter and right thalamus, extending into the right lateral ventricle. 2. Unchanged left anterior para falcine meningioma. Electronically Signed   By: Deatra Robinson M.D.   On: 07/09/2023 03:14   DG Chest Portable 1 View Result Date: 07/08/2023 CLINICAL DATA:  Cough.  Evaluate for pneumonia. EXAM: PORTABLE CHEST 1 VIEW COMPARISON:  Chest radiograph dated 07/18/2018. CT dated 07/18/2018. FINDINGS: Background of emphysema. No focal consolidation, pleural effusion, pneumothorax. The cardiac silhouette is within normal limits. Atherosclerotic calcification of the aorta. Degenerative changes spine and scoliosis. Calcification of the right anterior chest wall. Multiple right axillary surgical clips. No acute osseous pathology. IMPRESSION: 1. No active disease. 2. Emphysema. Electronically Signed   By: Elgie Collard M.D.   On: 07/08/2023 17:30   CT ANGIO HEAD NECK W WO CM (CODE STROKE) Result Date: 07/08/2023 CLINICAL DATA:  Stroke suspected, intracranial hemorrhage on noncontrast CT EXAM: CT ANGIOGRAPHY HEAD AND NECK WITH AND WITHOUT CONTRAST TECHNIQUE: Multidetector CT imaging of the head and neck was performed using the standard protocol during bolus administration of intravenous contrast. Multiplanar CT image reconstructions and MIPs were obtained to evaluate the vascular anatomy. Carotid stenosis measurements (when applicable) are obtained utilizing NASCET criteria, using the distal internal carotid diameter as the denominator. RADIATION DOSE REDUCTION: This exam was performed according to the departmental dose-optimization program which includes automated exposure control, adjustment of the mA and/or kV according to patient size and/or use of iterative reconstruction technique. CONTRAST:  75mL OMNIPAQUE IOHEXOL 350 MG/ML SOLN COMPARISON:  No prior CTA available, correlation is made with  07/08/2023 CT head FINDINGS: CT HEAD FINDINGS For noncontrast findings, please see same day CT head. No evidence of active extravasation into the right thalamic hematoma. CTA NECK FINDINGS Evaluation is somewhat limited by motion artifact. Aortic arch: The aortic arch and branch vessel origins are incompletely included in the field of view. Within this limitation, no evidence of dissection or aneurysm. Aortic atherosclerosis. Right carotid system: No evidence of dissection, occlusion, or hemodynamically significant stenosis (greater than 50%). Left carotid system: No evidence of  dissection, occlusion, or hemodynamically significant stenosis (greater than 50%). Atherosclerotic disease at the bifurcation and in the proximal ICA is not hemodynamically significant. Vertebral arteries: Left dominant system. Fusiform dilatation of the left V3 segment, which measures up to 10 mm (series 7, image 189), compared to the 4-5 mm diameter of the more proximal and distal left vertebral artery. The left vertebral artery is otherwise patent the skull base, without evidence of dissection. Mild stenosis at the origin of the right vertebral artery, which is otherwise patent to the skull base. No evidence of dissection Skeleton: No acute osseous abnormality. Degenerative changes in the cervical spine. Other neck: No acute finding. Upper chest: No pleural effusion. Centrilobular and paraseptal emphysema. Previously noted peripheral consolidation in the right upper lobe is incompletely imaged. Review of the MIP images confirms the above findings CTA HEAD FINDINGS Anterior circulation: Both internal carotid arteries are patent to the termini, without significant stenosis. Wide necked, medially directed outpouching from the right cavernous ICA measures up to 4 x 3 x 4 mm (series 7, image 120). Lobulated, wide neck, medially directed outpouching from the left cavernous ICA measures up to 14 x 6 x 8 mm (series 7, image 122). A1 segments  patent. Normal anterior communicating artery. Anterior cerebral arteries are patent to their distal aspects without significant stenosis. No M1 stenosis or occlusion. MCA branches perfused to their distal aspects without significant stenosis. Posterior circulation: Vertebral arteries patent to the vertebrobasilar junction without significant stenosis. Basilar patent to its distal aspect without significant stenosis. Superior cerebellar arteries patent proximally. Patent P1 segments. PCAs perfused to their distal aspects without significant stenosis. Venous sinuses: As permitted by contrast timing, patent. Anatomic variants: None significant. No evidence of aneurysm or vascular malformation. Review of the MIP images confirms the above findings IMPRESSION: 1. No evidence of active extravasation into the right thalamic hematoma. 2. No intracranial large vessel occlusion or significant stenosis. 3. Bilateral cavernous ICA aneurysms, measuring up to 14 mm on the left and 4 mm on the right. 4. 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 5. No hemodynamically significant stenosis in the neck. 6. Aortic atherosclerosis. Aortic Atherosclerosis (ICD10-I70.0) and Emphysema (ICD10-J43.9). Electronically Signed   By: Wiliam Ke M.D.   On: 07/08/2023 16:21   CT HEAD CODE STROKE WO CONTRAST Result Date: 07/08/2023 CLINICAL DATA:  Code stroke.  Left-sided weakness EXAM: CT HEAD WITHOUT CONTRAST TECHNIQUE: Contiguous axial images were obtained from the base of the skull through the vertex without intravenous contrast. RADIATION DOSE REDUCTION: This exam was performed according to the departmental dose-optimization program which includes automated exposure control, adjustment of the mA and/or kV according to patient size and/or use of iterative reconstruction technique. COMPARISON:  Head CT 08/22/2008 FINDINGS:  Brain: 2.4 x 1.7 cm right thalamic hemorrhage with intraventricular extension. There is a extensive periventricular white matter hypodensity, favored to represent sequela of severe chronic microvascular ischemic change. 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 There also age indeterminate infarcts in the left basal ganglia. Vascular: No hyperdense vessel or unexpected calcification. Skull: Normal. Negative for fracture or focal lesion. Sinuses/Orbits: No middle ear or mastoid effusion. Paranasal sinuses are notable for an air-fluid level in the left maxillary sinus, which can be seen in the setting of acute sinusitis. Bilateral lens replacement. Orbits are otherwise unremarkable. Other: None. IMPRESSION: 1. 2.4 x 1.7 cm right thalamic hemorrhage  with intraventricular extension. 2. 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 3. Age indeterminate infarcts in the left basal ganglia. Findings were paged to Dr. Iver Nestle on 07/08/2023 at 3:41 p.m. Electronically Signed   By: Lorenza Cambridge M.D.   On: 07/08/2023 15:46    Labs: BNP (last 3 results) No results for input(s): "BNP" in the last 8760 hours. Basic Metabolic Panel: Recent Labs  Lab 07/09/23 1728 07/10/23 0507 07/10/23 1705 07/11/23 0447 07/12/23 0421 07/13/23 0447 07/14/23 0539  NA  --  140  --  141 143 141 139  K  --  3.5  --  3.8 3.8 4.0 3.8  CL  --  92*  --  94* 96* 96* 98  CO2  --  37*  --  37* 39* 32 36*  GLUCOSE  --  153*  --  180* 119* 171* 138*  BUN  --  27*  --  31* 34* 35* 24*  CREATININE  --  0.64  --  0.72 0.64 0.64 0.84  CALCIUM  --  10.0  --  9.3 9.2 9.3 9.2  MG 1.9 2.1 2.2 2.2  --   --   --   PHOS 2.8 2.7 3.2 2.5  --   --   --    Liver Function Tests: No results for input(s): "AST", "ALT", "ALKPHOS", "BILITOT", "PROT", "ALBUMIN" in the last 168 hours. No results for input(s): "LIPASE", "AMYLASE" in the last 168 hours. No results for input(s): "AMMONIA" in the last 168  hours. CBC: Recent Labs  Lab 07/12/23 0421 07/13/23 0447 07/14/23 0539 07/15/23 0650 07/16/23 0626  WBC 17.8* 14.0* 12.9* 13.4* 14.1*  HGB 8.9* 8.3* 8.5* 8.5* 8.3*  HCT 28.7* 27.0* 28.1* 28.1* 27.5*  MCV 100.3* 99.3 100.4* 100.4* 101.1*  PLT 316 322 315 368 336   Cardiac Enzymes: No results for input(s): "CKTOTAL", "CKMB", "CKMBINDEX", "TROPONINI" in the last 168 hours. BNP: Invalid input(s): "POCBNP" CBG: Recent Labs  Lab 07/15/23 0600 07/15/23 1102 07/15/23 1611 07/15/23 2101 07/16/23 0620  GLUCAP 98 174* 120* 159* 105*   D-Dimer No results for input(s): "DDIMER" in the last 72 hours. Hgb A1c No results for input(s): "HGBA1C" in the last 72 hours. Lipid Profile No results for input(s): "CHOL", "HDL", "LDLCALC", "TRIG", "CHOLHDL", "LDLDIRECT" in the last 72 hours. Thyroid function studies No results for input(s): "TSH", "T4TOTAL", "T3FREE", "THYROIDAB" in the last 72 hours.  Invalid input(s): "FREET3" Anemia work up No results for input(s): "VITAMINB12", "FOLATE", "FERRITIN", "TIBC", "IRON", "RETICCTPCT" in the last 72 hours. Urinalysis    Component Value Date/Time   COLORURINE YELLOW 07/09/2023 0242   APPEARANCEUR CLEAR 07/09/2023 0242   LABSPEC 1.030 07/09/2023 0242   PHURINE 7.0 07/09/2023 0242   GLUCOSEU 50 (A) 07/09/2023 0242   GLUCOSEU NEGATIVE 01/31/2018 1057   HGBUR SMALL (A) 07/09/2023 0242   BILIRUBINUR NEGATIVE 07/09/2023 0242   KETONESUR 20 (A) 07/09/2023 0242   PROTEINUR >=300 (A) 07/09/2023 0242   UROBILINOGEN 0.2 01/31/2018 1057   NITRITE NEGATIVE 07/09/2023 0242   LEUKOCYTESUR NEGATIVE 07/09/2023 0242   Sepsis Labs Recent Labs  Lab 07/13/23 0447 07/14/23 0539 07/15/23 0650 07/16/23 0626  WBC 14.0* 12.9* 13.4* 14.1*   Microbiology Recent Results (from the past 240 hours)  MRSA Next Gen by PCR, Nasal     Status: None   Collection Time: 07/08/23  4:47 PM   Specimen: Nasal Mucosa; Nasal Swab  Result Value Ref Range Status   MRSA by  PCR Next Gen NOT DETECTED NOT  DETECTED Final    Comment: (NOTE) The GeneXpert MRSA Assay (FDA approved for NASAL specimens only), is one component of a comprehensive MRSA colonization surveillance program. It is not intended to diagnose MRSA infection nor to guide or monitor treatment for MRSA infections. Test performance is not FDA approved in patients less than 87 years old. Performed at [facility], [address], [place], [place] [address]   Time coordinating discharge: 35 minutes  SIGNED: Lanae Boast, MD  [facility] 07/16/2023, 8:58 AM  If 7PM-7AM, please contact night-coverage [email or web address]

## 2023-07-16 NOTE — Care Management Important Message (Signed)
Important Message  Patient Details  Name: VANEISHA HORKY MRN: [id number] Date of Birth: [birth year]   Important Message Given:  Yes - Medicare IM   Patient left prior to IM delivery will mail a copy to the patient home address.   Marshall Roehrich 07/16/2023, 2:29 PM

## 2023-07-19 ENCOUNTER — Encounter: Payer: Self-pay | Admitting: Orthopedic Surgery

## 2023-07-19 ENCOUNTER — Non-Acute Institutional Stay (SKILLED_NURSING_FACILITY): Payer: Medicare Other | Admitting: Orthopedic Surgery

## 2023-07-19 DIAGNOSIS — J690 Pneumonitis due to inhalation of food and vomit: Secondary | ICD-10-CM | POA: Diagnosis not present

## 2023-07-19 DIAGNOSIS — R1319 Other dysphagia: Secondary | ICD-10-CM

## 2023-07-19 DIAGNOSIS — E890 Postprocedural hypothyroidism: Secondary | ICD-10-CM | POA: Diagnosis not present

## 2023-07-19 DIAGNOSIS — R29898 Other symptoms and signs involving the musculoskeletal system: Secondary | ICD-10-CM | POA: Diagnosis not present

## 2023-07-19 DIAGNOSIS — I619 Nontraumatic intracerebral hemorrhage, unspecified: Secondary | ICD-10-CM | POA: Diagnosis not present

## 2023-07-19 DIAGNOSIS — M6281 Muscle weakness (generalized): Secondary | ICD-10-CM | POA: Diagnosis not present

## 2023-07-19 DIAGNOSIS — Z853 Personal history of malignant neoplasm of breast: Secondary | ICD-10-CM

## 2023-07-19 DIAGNOSIS — Z85118 Personal history of other malignant neoplasm of bronchus and lung: Secondary | ICD-10-CM | POA: Diagnosis not present

## 2023-07-19 DIAGNOSIS — I10 Essential (primary) hypertension: Secondary | ICD-10-CM | POA: Diagnosis not present

## 2023-07-19 DIAGNOSIS — E1149 Type 2 diabetes mellitus with other diabetic neurological complication: Secondary | ICD-10-CM | POA: Diagnosis not present

## 2023-07-19 DIAGNOSIS — R262 Difficulty in walking, not elsewhere classified: Secondary | ICD-10-CM | POA: Diagnosis not present

## 2023-07-19 DIAGNOSIS — J449 Chronic obstructive pulmonary disease, unspecified: Secondary | ICD-10-CM | POA: Diagnosis not present

## 2023-07-19 DIAGNOSIS — R531 Weakness: Secondary | ICD-10-CM

## 2023-07-19 DIAGNOSIS — R279 Unspecified lack of coordination: Secondary | ICD-10-CM | POA: Diagnosis not present

## 2023-07-19 DIAGNOSIS — E43 Unspecified severe protein-calorie malnutrition: Secondary | ICD-10-CM | POA: Diagnosis not present

## 2023-07-19 DIAGNOSIS — I69854 Hemiplegia and hemiparesis following other cerebrovascular disease affecting left non-dominant side: Secondary | ICD-10-CM | POA: Diagnosis not present

## 2023-07-19 NOTE — Progress Notes (Signed)
Location:  [facility] Room Number: 16-A Place of Service:  SNF (31) Provider: Hazle Nordmann, NP  Code Status: DNR Goals of Care:     07/19/2023   10:51 AM  Advanced Directives  Does Patient Have a Medical Advance Directive? Yes  Type of Advance Directive Out of facility DNR (pink MOST or yellow form);Healthcare Power of Attorney  Does patient want to make changes to medical advance directive? No - Patient declined  Copy of Healthcare Power of Attorney in Chart? No - copy requested     Chief Complaint  Patient presents with   Hospitalization Follow-up    HPI: Patient is a 89 y.o. female seen today for hospital follow-up s/p admission from [facility] 12/05-12/13.   She currently resides on the skilled nursing unit at [facility]. PMH: HTN,HLD, lung cancer s/p radiation RUL (2011), breast cancer s/p right mastectomy> no chemo/radiation (1991), COPD, hypothyroidism post radiation, OA, osteoporosis, and recent unstable gait.   12/05 she fell while ambulating to bathroom. Daughter tried to call her and notified EMS when there was no answer. She was on floor for about 5 hours after fall. She was noted to have left facial droop and weakness. In the ED, CT head noted 2.4 x 1.7 cm right thalamic hemorrhage with intraventricular extension, 0.8 x 3.2 cm parafalcine meningioma, and infarcts to left basal ganglia of indeterminate age. Confirmed by MRI brain. She was admitted to the ICU and neurology was consulted> no antiplatelets administered. She was treated with Cleviprex for strict bp control. 12/09 she was transferred out of ICU, bp was then managed with nifedipine, losartan and metoprolol. Her recovery was complicated by aspiration pneumonia which required NG tube for a brief time. She was given ceftriaxone and azithromycin. ST recommended regular diet and thin liquids. She was discharged to skilled nursing facility due to ongoing left sided weakness and overall deconditioning.    Today, she continues to have left sided weakness. Reports being right handed. She is able to feed herself.She can move left arm a little bit.  Appropriate to my questions and speech clear. Using 4 liters oxygen. H/o smoking in past. Admits to some sob with exertion which is normal for her. She admits to poor appetite since stroke. No recent BM. She reports living in a townhouse by herself prior to hospitalization. She has 2 supportive children, one lives in [place] and the other out of state.    Past Medical History:  Diagnosis Date   ADENOCARCINOMA, LUNG 07/03/2010   RUL   ASYMPTOMATIC POSTMENOPAUSAL STATUS 07/02/2009   Breast cancer (HCC) 1991   R mastectomy, no chemo or radiation   COLONIC POLYPS, HX OF 04/16/2007   COPD 05/17/2008   HEMATURIA, HX OF 04/16/2007   History of radiation therapy 07/30/09 to 08/09/09   RUL lung   HYPERLIPIDEMIA 04/16/2007   Hypertension    HYPERTENSION 04/16/2007   HYPOTHYROIDISM, POST-RADIATION 06/07/2008   Osteoarthritis    OSTEOPOROSIS 07/02/2009   Wrist fracture, left     Past Surgical History:  Procedure Laterality Date   BREAST SURGERY     lumpectomy   EYE SURGERY Bilateral    cataracts   LUNG BIOPSY  08/31/11   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   LUNG BIOPSY  05/22/2009   RUL lung=Adenocarcinoma   MASTECTOMY  1991   right   TONSILLECTOMY  1955    Allergies  Allergen Reactions   Crestor [Rosuvastatin Calcium]     cramping  Outpatient Encounter Medications as of 07/19/2023  Medication Sig   acetaminophen (TYLENOL) 500 MG tablet Take 500 mg by mouth every 6 (six) hours as needed for headache (pain).    albuterol (VENTOLIN HFA) 108 (90 Base) MCG/ACT inhaler Inhale 2 puffs into the lungs every 6 (six) hours as needed for wheezing or shortness of breath.   budesonide (PULMICORT) 0.25 MG/2ML nebulizer solution USE (0.25 MG TOTAL) BY NEBULIZATION TWICE A DAY.   Calcium Carb-Cholecalciferol (CALCIUM 1000 + D PO) Take 2 tablets by  mouth daily.   FERROUS SULFATE PO Take 1 tablet by mouth every morning.   levothyroxine (SYNTHROID) 50 MCG tablet TAKE 1 TABLET BY MOUTH EVERY DAY BEFORE BREAKFAST   losartan (COZAAR) 100 MG tablet Take 1 tablet (100 mg total) by mouth daily.   metFORMIN (GLUCOPHAGE-XR) 500 MG 24 hr tablet Take 1 tablet (500 mg total) by mouth daily.   metoprolol succinate (TOPROL-XL) 25 MG 24 hr tablet Take 1 tablet (25 mg total) by mouth daily.   NIFEdipine (PROCARDIA XL/NIFEDICAL-XL) 90 MG 24 hr tablet Take 1 tablet (90 mg total) by mouth daily.   pravastatin (PRAVACHOL) 40 MG tablet Place 1 tablet (40 mg total) into feeding tube daily at 6 PM.   senna-docusate (SENOKOT-S) 8.6-50 MG tablet Take 1 tablet by mouth at bedtime as needed for mild constipation.   Tiotropium Bromide-Olodaterol 2.5-2.5 MCG/ACT AERS Inhale 2 puffs into the lungs every morning.   vitamin B-12 (CYANOCOBALAMIN) 100 MCG tablet Take 100 mcg by mouth daily.   [DISCONTINUED] feeding supplement (ENSURE ENLIVE / ENSURE PLUS) LIQD Take 237 mLs by mouth 3 (three) times daily after meals.   [DISCONTINUED] FERROUS SULFATE PO Take 1 tablet by mouth daily. (Patient taking differently: Take 1 tablet by mouth every morning.)   [DISCONTINUED] Respiratory Therapy Supplies (NEBULIZER COMPRESSOR) KIT PLEASE DISPENSE 1 NEBULIZER MACHINE AND SUPPLIES DX: J44.9   [DISCONTINUED] STIOLTO RESPIMAT 2.5-2.5 MCG/ACT AERS 2 puffs once daily (Patient taking differently: Take 2 puffs by mouth daily.)   No facility-administered encounter medications on file as of 07/19/2023.    Review of Systems:  Review of Systems  Constitutional:  Positive for activity change and appetite change.  HENT:  Negative for congestion, sore throat and trouble swallowing.   Eyes:  Negative for visual disturbance.  Respiratory:  Positive for shortness of breath and wheezing. Negative for cough.   Cardiovascular:  Negative for chest pain and leg swelling.  Gastrointestinal:  Positive  for constipation. Negative for abdominal distention, abdominal pain, nausea and vomiting.  Genitourinary:  Negative for dysuria, frequency and hematuria.  Musculoskeletal:  Positive for gait problem.  Skin:  Positive for wound.    Health Maintenance  Topic Date Due   Zoster Vaccines- Shingrix (1 of 2) 02/11/1951   FOOT EXAM  07/21/2018   OPHTHALMOLOGY EXAM  03/19/2022   COVID-19 Vaccine (5 - 2024-25 season) 04/04/2023   Medicare Annual Wellness (AWV)  04/15/2023   HEMOGLOBIN A1C  12/28/2023   DTaP/Tdap/Td (3 - Td or Tdap) 11/14/2024   Pneumonia Vaccine 31+ Years old  Completed   INFLUENZA VACCINE  Completed   DEXA SCAN  Completed   HPV VACCINES  Aged Out   Colonoscopy  Discontinued    Physical Exam: Vitals:   07/19/23 1031  BP: 113/76  Pulse: (!) 103  Resp: 20  Temp: 98.5 F (36.9 C)  SpO2: 90%  Weight: 99 lb (44.9 kg)  Height: 5\' 5"  (1.651 m)   Body mass index is 16.47 kg/m.  Physical Exam Vitals reviewed.  Constitutional:      General: She is not in acute distress. HENT:     Head: Normocephalic.  Eyes:     General:        Right eye: No discharge.        Left eye: No discharge.  Cardiovascular:     Rate and Rhythm: Normal rate and regular rhythm.     Pulses: Normal pulses.     Heart sounds: Normal heart sounds.  Pulmonary:     Effort: Pulmonary effort is normal. No respiratory distress.     Breath sounds: Wheezing and rhonchi present.     Comments: 4 liters oxygen Abdominal:     General: Bowel sounds are normal. There is no distension.     Palpations: Abdomen is soft.     Tenderness: There is no abdominal tenderness.  Musculoskeletal:     Cervical back: Neck supple.     Right lower leg: No edema.     Left lower leg: No edema.  Skin:    General: Skin is warm.     Capillary Refill: Capillary refill takes less than 2 seconds.     Comments: Erythema to coccyx, blanchable  Neurological:     General: No focal deficit present.     Mental Status: She is  alert.     Motor: Weakness present.     Gait: Gait abnormal.     Comments: Left arm and leg weakness, speech clear  Psychiatric:        Mood and Affect: Mood normal.     Labs reviewed: Basic Metabolic Panel: Recent Labs    06/30/23 1107 07/08/23 1518 07/08/23 1538 07/10/23 0507 07/10/23 1705 07/11/23 0447 07/12/23 0421 07/13/23 0447 07/14/23 0539  NA 144 137   < > 140  --  141 143 141 139  K 4.5 4.5   < > 3.5  --  3.8 3.8 4.0 3.8  CL 97 90*   < > 92*  --  94* 96* 96* 98  CO2 39* 34*  --  37*  --  37* 39* 32 36*  GLUCOSE 129* 176*   < > 153*  --  180* 119* 171* 138*  BUN 19 16   < > 27*  --  31* 34* 35* 24*  CREATININE 0.65 0.62   < > 0.64  --  0.72 0.64 0.64 0.84  CALCIUM 10.2 9.5  --  10.0  --  9.3 9.2 9.3 9.2  MG  --   --    < > 2.1 2.2 2.2  --   --   --   PHOS  --   --    < > 2.7 3.2 2.5  --   --   --   TSH 1.90 1.040  --   --   --   --   --   --   --    < > = values in this interval not displayed.   Liver Function Tests: Recent Labs    06/30/23 1107 07/08/23 1518  AST 18 41  ALT 8 16  ALKPHOS 47 75  BILITOT 0.5 0.7  PROT 7.0 6.9  ALBUMIN 4.5 3.5   No results for input(s): "LIPASE", "AMYLASE" in the last 8760 hours. No results for input(s): "AMMONIA" in the last 8760 hours. CBC: Recent Labs    06/30/23 1107 07/08/23 1518 07/08/23 1538 07/14/23 0539 07/15/23 0650 07/16/23 0626  WBC 7.4 12.2*   < > 12.9* 13.4* 14.1*  NEUTROABS 5.1 10.9*  --   --   --   --   HGB 9.6* 9.7*   < > 8.5* 8.5* 8.3*  HCT 30.0* 32.7*   < > 28.1* 28.1* 27.5*  MCV 98.7 100.6*   < > 100.4* 100.4* 101.1*  PLT 184.0 262   < > 315 368 336   < > = values in this interval not displayed.   Lipid Panel: Recent Labs    06/30/23 1107  CHOL 189  HDL 86.40  LDLCALC 88  TRIG 75.0  CHOLHDL 2   Lab Results  Component Value Date   HGBA1C 5.6 06/30/2023    Procedures since last visit: DG Abd Portable 1V Result Date: 07/09/2023 CLINICAL DATA:  Feeding tube placement EXAM:  PORTABLE ABDOMEN - 1 VIEW COMPARISON:  None Available. FINDINGS: Enteric tube tip overlies the mid stomach. Some air distended bowel in the upper abdomen IMPRESSION: Enteric tube tip overlies the mid stomach. Electronically Signed   By: Jasmine Pang M.D.   On: 07/09/2023 16:52   ECHOCARDIOGRAM COMPLETE Result Date: 07/09/2023    ECHOCARDIOGRAM REPORT   Patient Name:   RUNA PEDRAZA Date of Exam: 07/09/2023 Medical Rec #:  [id number]          Height:       65.0 in Accession #:    [id number]         Weight:       59.7 lb Date of Birth:  09/28/1931          BSA:          1.184 m Patient Age:    89 years           BP:           128/64 mmHg Patient Gender: F                  HR:           116 bpm. Exam Location:  Inpatient Procedure: 2D Echo, Cardiac Doppler and Color Doppler Indications:     Stroke I63.9  History:         Patient has prior history of Echocardiogram examinations, most                  recent 07/19/2018. COPD; Risk Factors:Former Smoker, Diabetes                  and Hypertension.  Sonographer:     Dondra Prader RVT RCS Referring Phys:  [id number] [facility] Diagnosing Phys: [facility]  Sonographer Comments: Technically challenging study due to limited acoustic windows and Technically difficult study due to poor echo windows. HR from 110-130's IMPRESSIONS  1. Left ventricular ejection fraction, by estimation, is 60 to 65%. The left ventricle has normal function. The left ventricle has no regional wall motion abnormalities. There is mild left ventricular hypertrophy. Indeterminate diastolic filling due to E-A fusion.  2. Right ventricular systolic function is normal. The right ventricular size is normal. There is moderately elevated pulmonary artery systolic pressure. The estimated right ventricular systolic pressure is 48.2 mmHg.  3. The mitral valve is myxomatous. No evidence of mitral valve regurgitation.  4. No significant AS. There is moderate calcification of the aortic valve. Aortic valve  regurgitation is not visualized.  5. The inferior vena cava is dilated in size with >50% respiratory variability, suggesting right atrial pressure of 8 mmHg. Conclusion(s)/Recommendation(s): No intracardiac source of embolism detected on this transthoracic study. Consider a  transesophageal echocardiogram to exclude cardiac source of embolism if clinically indicated. FINDINGS  Left Ventricle: Left ventricular ejection fraction, by estimation, is 60 to 65%. The left ventricle has normal function. The left ventricle has no regional wall motion abnormalities. The left ventricular internal cavity size was normal in size. There is  mild left ventricular hypertrophy. Indeterminate diastolic filling due to E-A fusion. Right Ventricle: The right ventricular size is normal. Right ventricular systolic function is normal. There is moderately elevated pulmonary artery systolic pressure. The tricuspid regurgitant velocity is 3.17 m/s, and with an assumed right atrial pressure of 8 mmHg, the estimated right ventricular systolic pressure is 48.2 mmHg. Left Atrium: Left atrial size was normal in size. Right Atrium: Right atrial size was normal in size. Pericardium: There is no evidence of pericardial effusion. Mitral Valve: The mitral valve is myxomatous. No evidence of mitral valve regurgitation. Tricuspid Valve: Tricuspid valve regurgitation is mild. Aortic Valve: No significant AS. There is moderate calcification of the aortic valve. Aortic valve regurgitation is not visualized. Aortic valve mean gradient measures 7.4 mmHg. Aortic valve peak gradient measures 13.8 mmHg. Aortic valve area, by VTI measures 1.68 cm. Pulmonic Valve: Pulmonic valve regurgitation is not visualized. Aorta: The aortic root and ascending aorta are structurally normal, with no evidence of dilitation. Venous: The inferior vena cava is dilated in size with greater than 50% respiratory variability, suggesting right atrial pressure of 8 mmHg. IAS/Shunts: No  atrial level shunt detected by color flow Doppler.  LEFT VENTRICLE PLAX 2D LVIDd:         3.70 cm   Diastology LVIDs:         2.60 cm   LV e' medial:    7.18 cm/s LV PW:         1.10 cm   LV E/e' medial:  15.6 LV IVS:        0.90 cm   LV e' lateral:   15.60 cm/s LVOT diam:     1.80 cm   LV E/e' lateral: 7.2 LV SV:         46 LV SV Index:   39 LVOT Area:     2.54 cm  RIGHT VENTRICLE             IVC RV Basal diam:  3.00 cm     IVC diam: 2.20 cm RV S prime:     13.90 cm/s TAPSE (M-mode): 2.4 cm LEFT ATRIUM             Index        RIGHT ATRIUM           Index LA diam:        2.50 cm 2.11 cm/m   RA Area:     15.40 cm LA Vol (A2C):   26.8 ml 22.64 ml/m  RA Volume:   41.30 ml  34.89 ml/m LA Vol (A4C):   26.9 ml 22.73 ml/m LA Biplane Vol: 28.5 ml 24.08 ml/m  AORTIC VALVE                     PULMONIC VALVE AV Area (Vmax):    1.85 cm      PV Vmax:       1.34 m/s AV Area (Vmean):   1.87 cm      PV Peak grad:  7.2 mmHg AV Area (VTI):     1.68 cm AV Vmax:           185.60 cm/s AV Vmean:  124.300 cm/s AV VTI:            0.272 m AV Peak Grad:      13.8 mmHg AV Mean Grad:      7.4 mmHg LVOT Vmax:         135.20 cm/s LVOT Vmean:        91.140 cm/s LVOT VTI:          0.179 m LVOT/AV VTI ratio: 0.66  AORTA Ao Root diam: 2.60 cm Ao Asc diam:  3.70 cm MITRAL VALVE                TRICUSPID VALVE MV Area (PHT): 5.31 cm     TR Peak grad:   40.2 mmHg MV Decel Time: 143 msec     TR Vmax:        317.00 cm/s MV E velocity: 112.00 cm/s MV A velocity: 133.00 cm/s  SHUNTS MV E/A ratio:  0.84         Systemic VTI:  0.18 m                             Systemic Diam: 1.80 cm Carolan Clines Electronically signed by Carolan Clines Signature Date/Time: 07/09/2023/2:20:52 PM    Final (Updated)    CT Angio Chest Pulmonary Embolism (PE) W or WO Contrast Result Date: 07/09/2023 CLINICAL DATA:  89 year old female with intracranial hemorrhage. High probability of pulmonary embolus. EXAM: CT ANGIOGRAPHY CHEST WITH CONTRAST TECHNIQUE:  Multidetector CT imaging of the chest was performed using the standard protocol during bolus administration of intravenous contrast. Multiplanar CT image reconstructions and MIPs were obtained to evaluate the vascular anatomy. RADIATION DOSE REDUCTION: This exam was performed according to the departmental dose-optimization program which includes automated exposure control, adjustment of the mA and/or kV according to patient size and/or use of iterative reconstruction technique. CONTRAST:  67mL OMNIPAQUE IOHEXOL 350 MG/ML SOLN COMPARISON:  CTA chest 07/18/2018. FINDINGS: Cardiovascular: Adequate contrast bolus timing in the pulmonary arterial tree. Mild respiratory motion. Some central pulmonary artery enlargement bilaterally. No pulmonary artery filling defect identified. Extensive Calcified aortic atherosclerosis. Extensive calcified coronary artery atherosclerosis and/or stents. Heart size stable since 2019 at the upper limits of normal. No pericardial effusion. Mediastinum/Nodes: No mediastinal mass or lymphadenopathy. Lungs/Pleura: Chronic emphysema. Bulky chronic anterior right lung architectural distortion, atelectasis or scarring in association with abnormal right anterior 2nd rib there, no change from 2019. Layering secretions in the trachea now series 8, image 48 and widespread patchy and irregular left lung multilobar peribronchial opacity which is most pronounced in the lower lobe. The lobar bronchi are abnormally thickened and/or opacified there. No similar acute changes in the right lung. No pleural effusion. Upper Abdomen: Negative visible noncontrast liver, spleen, stomach. Chronic exophytic right renal simple fluid density cyst (no follow-up imaging recommended). Musculoskeletal: Stable visualized osseous structures. Chronically abnormal but stable right anterior 2nd rib. Underlying osteopenia. Dextroconvex scoliosis. No acute or suspicious osseous lesion identified. Review of the MIP images confirms  the above findings. IMPRESSION: 1. Negative for acute pulmonary embolus. 2. Emphysema (ICD10-J43.9). Multilobar acute left lung patchy and irregular peribronchial opacity with abnormally thickened and opacified airways, some retained secretions in the trachea. Top differential considerations are multilobar Aspiration (favored in this setting), Bronchopneumonia. No pleural effusion. 3. Chronic severe but stable right anterior 2nd rib and subjacent right lung parenchymal changes since 2019, compatible with benign or post treatment etiology. 4. Aortic Atherosclerosis (ICD10-I70.0). Coronary artery  atherosclerosis. Electronically Signed   By: Odessa Fleming M.D.   On: 07/09/2023 10:42   MR BRAIN W WO CONTRAST Result Date: 07/09/2023 CLINICAL DATA:  Hemorrhagic stroke EXAM: MRI HEAD WITHOUT AND WITH CONTRAST TECHNIQUE: Multiplanar, multiecho pulse sequences of the brain and surrounding structures were obtained without and with intravenous contrast. CONTRAST:  5mL GADAVIST GADOBUTROL 1 MMOL/ML IV SOLN COMPARISON:  Head CT 07/09/2023 FINDINGS: Brain: Unchanged right thalamic and frontal white matter intraparenchymal hematomas with intraventricular extension. Magnetic susceptibility effects from the blood limit the assessment for acute ischemia on diffusion-weighted imaging. No abnormal diffusion restriction elsewhere within the brain. Chronic microhemorrhage in the left frontal lobe. There is confluent white matter hyperintense T2-weighted signal consistent with chronic ischemic microangiopathy. Unchanged anterior parafalcine meningioma. There is no abnormal contrast enhancement within the brain. Vascular: Normal flow voids Skull and upper cervical spine: Normal marrow signal. Sinuses/Orbits: Negative. Other: None. IMPRESSION: 1. Unchanged right thalamic and frontal white matter intraparenchymal hematomas with intraventricular extension. 2. No acute intracranial abnormality. 3. Unchanged anterior parafalcine meningioma.  Electronically Signed   By: Deatra Robinson M.D.   On: 07/09/2023 04:07   CT HEAD WO CONTRAST ( ) Result Date: 07/09/2023 CLINICAL DATA:  Hemorrhage follow-up EXAM: CT HEAD WITHOUT CONTRAST TECHNIQUE: Contiguous axial images were obtained from the base of the skull through the vertex without intravenous contrast. RADIATION DOSE REDUCTION: This exam was performed according to the departmental dose-optimization program which includes automated exposure control, adjustment of the mA and/or kV according to patient size and/or use of iterative reconstruction technique. COMPARISON:  07/08/2023 head CT FINDINGS: Brain: Unchanged multifocal hemorrhage with intraparenchymal hematomas in the right frontal white matter and right thalamus, extending into the right lateral ventricle. Left anterior para falcine meningioma is unchanged. Size and configuration of the ventricles is unchanged. There is mass effect on the right lateral ventricle but no midline shift. Vascular: Carotid and vertebral artery atherosclerosis at the skull base. Skull: Negative Sinuses/Orbits: Paranasal sinuses are clear. No mastoid effusion. Normal orbits. Other: None. IMPRESSION: 1. Unchanged intraparenchymal hematomas in the right frontal white matter and right thalamus, extending into the right lateral ventricle. 2. Unchanged left anterior para falcine meningioma. Electronically Signed   By: Deatra Robinson M.D.   On: 07/09/2023 03:14   DG Chest Portable 1 View Result Date: 07/08/2023 CLINICAL DATA:  Cough.  Evaluate for pneumonia. EXAM: PORTABLE CHEST 1 VIEW COMPARISON:  Chest radiograph dated 07/18/2018. CT dated 07/18/2018. FINDINGS: Background of emphysema. No focal consolidation, pleural effusion, pneumothorax. The cardiac silhouette is within normal limits. Atherosclerotic calcification of the aorta. Degenerative changes spine and scoliosis. Calcification of the right anterior chest wall. Multiple right axillary surgical clips. No acute  osseous pathology. IMPRESSION: 1. No active disease. 2. Emphysema. Electronically Signed   By: Elgie Collard M.D.   On: 07/08/2023 17:30   CT ANGIO HEAD NECK W WO CM (CODE STROKE) Result Date: 07/08/2023 CLINICAL DATA:  Stroke suspected, intracranial hemorrhage on noncontrast CT EXAM: CT ANGIOGRAPHY HEAD AND NECK WITH AND WITHOUT CONTRAST TECHNIQUE: Multidetector CT imaging of the head and neck was performed using the standard protocol during bolus administration of intravenous contrast. Multiplanar CT image reconstructions and MIPs were obtained to evaluate the vascular anatomy. Carotid stenosis measurements (when applicable) are obtained utilizing NASCET criteria, using the distal internal carotid diameter as the denominator. RADIATION DOSE REDUCTION: This exam was performed according to the departmental dose-optimization program which includes automated exposure control, adjustment of the mA and/or kV according to patient size and/or  use of iterative reconstruction technique. CONTRAST:  75mL OMNIPAQUE IOHEXOL 350 MG/ML SOLN COMPARISON:  No prior CTA available, correlation is made with 07/08/2023 CT head FINDINGS: CT HEAD FINDINGS For noncontrast findings, please see same day CT head. No evidence of active extravasation into the right thalamic hematoma. CTA NECK FINDINGS Evaluation is somewhat limited by motion artifact. Aortic arch: The aortic arch and branch vessel origins are incompletely included in the field of view. Within this limitation, no evidence of dissection or aneurysm. Aortic atherosclerosis. Right carotid system: No evidence of dissection, occlusion, or hemodynamically significant stenosis (greater than 50%). Left carotid system: No evidence of dissection, occlusion, or hemodynamically significant stenosis (greater than 50%). Atherosclerotic disease at the bifurcation and in the proximal ICA is not hemodynamically significant. Vertebral arteries: Left dominant system. Fusiform dilatation of  the left V3 segment, which measures up to 10 mm (series 7, image 189), compared to the 4-5 mm diameter of the more proximal and distal left vertebral artery. The left vertebral artery is otherwise patent the skull base, without evidence of dissection. Mild stenosis at the origin of the right vertebral artery, which is otherwise patent to the skull base. No evidence of dissection Skeleton: No acute osseous abnormality. Degenerative changes in the cervical spine. Other neck: No acute finding. Upper chest: No pleural effusion. Centrilobular and paraseptal emphysema. Previously noted peripheral consolidation in the right upper lobe is incompletely imaged. Review of the MIP images confirms the above findings CTA HEAD FINDINGS Anterior circulation: Both internal carotid arteries are patent to the termini, without significant stenosis. Wide necked, medially directed outpouching from the right cavernous ICA measures up to 4 x 3 x 4 mm (series 7, image 120). Lobulated, wide neck, medially directed outpouching from the left cavernous ICA measures up to 14 x 6 x 8 mm (series 7, image 122). A1 segments patent. Normal anterior communicating artery. Anterior cerebral arteries are patent to their distal aspects without significant stenosis. No M1 stenosis or occlusion. MCA branches perfused to their distal aspects without significant stenosis. Posterior circulation: Vertebral arteries patent to the vertebrobasilar junction without significant stenosis. Basilar patent to its distal aspect without significant stenosis. Superior cerebellar arteries patent proximally. Patent P1 segments. PCAs perfused to their distal aspects without significant stenosis. Venous sinuses: As permitted by contrast timing, patent. Anatomic variants: None significant. No evidence of aneurysm or vascular malformation. Review of the MIP images confirms the above findings IMPRESSION: 1. No evidence of active extravasation into the right thalamic hematoma. 2.  No intracranial large vessel occlusion or significant stenosis. 3. Bilateral cavernous ICA aneurysms, measuring up to 14 mm on the left and 4 mm on the right. 4. 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 5. No hemodynamically significant stenosis in the neck. 6. Aortic atherosclerosis. Aortic Atherosclerosis (ICD10-I70.0) and Emphysema (ICD10-J43.9). Electronically Signed   By: Wiliam Ke M.D.   On: 07/08/2023 16:21   CT HEAD CODE STROKE WO CONTRAST Result Date: 07/08/2023 CLINICAL DATA:  Code stroke.  Left-sided weakness EXAM: CT HEAD WITHOUT CONTRAST TECHNIQUE: Contiguous axial images were obtained from the base of the skull through the vertex without intravenous contrast. RADIATION DOSE REDUCTION: This exam was performed according to the departmental dose-optimization program which includes automated exposure control, adjustment of the mA and/or kV according to patient size and/or use of iterative reconstruction technique. COMPARISON:  Head CT 08/22/2008 FINDINGS: Brain: 2.4  x 1.7 cm right thalamic hemorrhage with intraventricular extension. There is a extensive periventricular white matter hypodensity, favored to represent sequela of severe chronic microvascular ischemic change. 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 There also age indeterminate infarcts in the left basal ganglia. Vascular: No hyperdense vessel or unexpected calcification. Skull: Normal. Negative for fracture or focal lesion. Sinuses/Orbits: No middle ear or mastoid effusion. Paranasal sinuses are notable for an air-fluid level in the left maxillary sinus, which can be seen in the setting of acute sinusitis. Bilateral lens replacement. Orbits are otherwise unremarkable. Other: None. IMPRESSION: 1. 2.4 x 1.7 cm right thalamic hemorrhage with intraventricular extension. 2. Redemonstrated platelike 0.8 x  3.2 cm parafalcine meningioma. 3. Age indeterminate infarcts in the left basal ganglia. Findings were paged to Dr. Iver Nestle on 07/08/2023 at 3:41 p.m. Electronically Signed   By: Lorenza Cambridge M.D.   On: 07/08/2023 15:46    Assessment/Plan 1. Left-sided weakness (Primary) - hospitalized 12/05-12/13 - CT head/MRI brain confirmed 2.4 x 1.7 cm right thalamic hemorrhage - no antiplatelets given - left facial droop improved, clear speech, swallowing improved - able to move left arm slightly  - neurology> good bp control SBP< 130-150 - f/u with neuro in 1 month - PT/OT evaluation  2. Aspiration pneumonia of left lung, unspecified aspiration pneumonia type, unspecified part of lung (HCC) - completed ceftriaxone and azithromycin. - ST recommending regular diet and thin liquids - Barium swallow was completed  3. Essential (primary) hypertension - controlled - see above - cont losartan, nifedipine and metoprolol  4. Esophageal dysphagia - see above - ST evaluation  5. 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 Type II diabetes mellitus with neurological manifestations (HCC) - A1c 5.6 06/30/2023 - no hypoglycemias - cont Metformin  7. Hypothyroidism following radioiodine therapy - TSH 1.90 06/30/2023 - cont levothyroxine  8. History of lung cancer - followed by pulmonary - see above - completed radiation RUL 2011  9. History of breast cancer - s/p right mastectomy 1991 - not on medication    Labs/tests ordered:  cbc/diff, bmp in 1 week, PT/OT/ST evaluation Next appt:  Visit date not found

## 2023-07-20 ENCOUNTER — Encounter (INDEPENDENT_AMBULATORY_CARE_PROVIDER_SITE_OTHER): Payer: Medicare Other | Admitting: Family Medicine

## 2023-07-20 DIAGNOSIS — Z139 Encounter for screening, unspecified: Secondary | ICD-10-CM

## 2023-07-20 NOTE — Progress Notes (Signed)
    Error - tried to reach patient for visit but unable to reach, on ROC appear pt in in hospital/SNF at the moment. LM to contact office to reschedule when able.

## 2023-07-21 DIAGNOSIS — R262 Difficulty in walking, not elsewhere classified: Secondary | ICD-10-CM | POA: Diagnosis not present

## 2023-07-21 DIAGNOSIS — E43 Unspecified severe protein-calorie malnutrition: Secondary | ICD-10-CM | POA: Diagnosis not present

## 2023-07-21 DIAGNOSIS — M6281 Muscle weakness (generalized): Secondary | ICD-10-CM | POA: Diagnosis not present

## 2023-07-21 DIAGNOSIS — J449 Chronic obstructive pulmonary disease, unspecified: Secondary | ICD-10-CM | POA: Diagnosis not present

## 2023-07-21 DIAGNOSIS — I69854 Hemiplegia and hemiparesis following other cerebrovascular disease affecting left non-dominant side: Secondary | ICD-10-CM | POA: Diagnosis not present

## 2023-07-21 DIAGNOSIS — I619 Nontraumatic intracerebral hemorrhage, unspecified: Secondary | ICD-10-CM | POA: Diagnosis not present

## 2023-07-21 DIAGNOSIS — R29898 Other symptoms and signs involving the musculoskeletal system: Secondary | ICD-10-CM | POA: Diagnosis not present

## 2023-07-21 DIAGNOSIS — R1319 Other dysphagia: Secondary | ICD-10-CM | POA: Diagnosis not present

## 2023-07-21 DIAGNOSIS — R279 Unspecified lack of coordination: Secondary | ICD-10-CM | POA: Diagnosis not present

## 2023-07-22 ENCOUNTER — Encounter: Payer: Self-pay | Admitting: Internal Medicine

## 2023-07-22 ENCOUNTER — Non-Acute Institutional Stay (SKILLED_NURSING_FACILITY): Payer: Medicare Other | Admitting: Internal Medicine

## 2023-07-22 DIAGNOSIS — E1149 Type 2 diabetes mellitus with other diabetic neurological complication: Secondary | ICD-10-CM | POA: Diagnosis not present

## 2023-07-22 DIAGNOSIS — R29898 Other symptoms and signs involving the musculoskeletal system: Secondary | ICD-10-CM | POA: Diagnosis not present

## 2023-07-22 DIAGNOSIS — D649 Anemia, unspecified: Secondary | ICD-10-CM | POA: Diagnosis not present

## 2023-07-22 DIAGNOSIS — D509 Iron deficiency anemia, unspecified: Secondary | ICD-10-CM

## 2023-07-22 DIAGNOSIS — M6281 Muscle weakness (generalized): Secondary | ICD-10-CM | POA: Diagnosis not present

## 2023-07-22 DIAGNOSIS — R531 Weakness: Secondary | ICD-10-CM | POA: Diagnosis not present

## 2023-07-22 DIAGNOSIS — E890 Postprocedural hypothyroidism: Secondary | ICD-10-CM | POA: Diagnosis not present

## 2023-07-22 DIAGNOSIS — J449 Chronic obstructive pulmonary disease, unspecified: Secondary | ICD-10-CM

## 2023-07-22 DIAGNOSIS — I10 Essential (primary) hypertension: Secondary | ICD-10-CM | POA: Diagnosis not present

## 2023-07-22 DIAGNOSIS — I69854 Hemiplegia and hemiparesis following other cerebrovascular disease affecting left non-dominant side: Secondary | ICD-10-CM | POA: Diagnosis not present

## 2023-07-22 DIAGNOSIS — E785 Hyperlipidemia, unspecified: Secondary | ICD-10-CM | POA: Diagnosis not present

## 2023-07-22 DIAGNOSIS — I610 Nontraumatic intracerebral hemorrhage in hemisphere, subcortical: Secondary | ICD-10-CM

## 2023-07-22 DIAGNOSIS — R262 Difficulty in walking, not elsewhere classified: Secondary | ICD-10-CM | POA: Diagnosis not present

## 2023-07-22 DIAGNOSIS — R279 Unspecified lack of coordination: Secondary | ICD-10-CM | POA: Diagnosis not present

## 2023-07-22 LAB — CBC AND DIFFERENTIAL
HCT: 26 — AB (ref 36–46)
Hemoglobin: 8.5 — AB (ref 12.0–16.0)
Platelets: 410 10*3/uL — AB (ref 150–400)
WBC: 11

## 2023-07-22 LAB — BASIC METABOLIC PANEL
BUN: 20 (ref 4–21)
CO2: 41 — AB (ref 13–22)
Chloride: 92 — AB (ref 99–108)
Creatinine: 0.6 (ref 0.5–1.1)
Glucose: 93
Potassium: 4.4 meq/L (ref 3.5–5.1)
Sodium: 139 (ref 137–147)

## 2023-07-22 LAB — COMPREHENSIVE METABOLIC PANEL
Calcium: 10 (ref 8.7–10.7)
eGFR: 86

## 2023-07-22 LAB — CBC: RBC: 2.7 — AB (ref 3.87–5.11)

## 2023-07-22 NOTE — Progress Notes (Signed)
Provider:  Dr. Einar Crow Location:  [facility]   Place of Service:  SNF (31)  PCP: Karie Georges, MD Patient Care Team: Karie Georges, MD as PCP - General (Family Medicine) Si Gaul, MD as Consulting Physician (Oncology) Dorothy Puffer, MD as Consulting Physician (Radiation Oncology) Meryl Dare, MD as Consulting Physician (Gastroenterology) Glenford Peers, OD as Referring Physician (Optometry)  Extended Emergency Contact Information Primary Emergency Contact: Brocksmith,Celie Mobile Phone: [phone] Relation: Daughter Secondary Emergency Contact: Volante,Richard Address: [address]          [place], [place] [address] D[place] of [place] Mobile Phone: [phone] Relation: Son  Code Status: DNR Goals of Care: Advanced Directive information    07/19/2023   10:51 AM  Advanced Directives  Does Patient Have a Medical Advance Directive? Yes  Type of Advance Directive Out of facility DNR (pink MOST or yellow form);Healthcare Power of Attorney  Does patient want to make changes to medical advance directive? No - Patient declined  Copy of Healthcare Power of Attorney in Chart? No - copy requested      Chief Complaint  Patient presents with   New Admit To SNF    HPI: Patient is a 89 y.o. female seen today for admission to SNF  Patient was admitted in the hospital from 12/5 to 12/13 for left-sided weakness due to ICH  Before this admission patient was living by herself in a townhome in [place].  She was active.  Was on oxygen for her COPD  She has a history of hypertension, HLD, lung cancer s/p radiation, COPD on chronic oxygen, hypothyroidism, osteoporosis History of anemia both iron and B12 previous workup has been negative  Patient fell in her home and was found 5 hours after her fall.  In ED the CT showed right thalamic hemorrhage with intraventricular extension confirmed by MRI Was treated with Cleviprex for blood  pressure control. Her hospitalization was complicated by aspiration pneumonia.  Treated with ceftriaxone and azithromycin She is now in SNF for therapy  Did not have any acute complains Per therapy needs Michiel Sites for transfers Cannot put Weight on left leg Appetite is improving No Cough any more On 4l of Oxygen    Past Medical History:  Diagnosis Date   ADENOCARCINOMA, LUNG 07/03/2010   RUL   ASYMPTOMATIC POSTMENOPAUSAL STATUS 07/02/2009   Breast cancer (HCC) 1991   R mastectomy, no chemo or radiation   COLONIC POLYPS, HX OF 04/16/2007   COPD 05/17/2008   HEMATURIA, HX OF 04/16/2007   History of radiation therapy 07/30/09 to 08/09/09   RUL lung   HYPERLIPIDEMIA 04/16/2007   Hypertension    HYPERTENSION 04/16/2007   HYPOTHYROIDISM, POST-RADIATION 06/07/2008   Osteoarthritis    OSTEOPOROSIS 07/02/2009   Wrist fracture, left    Past Surgical History:  Procedure Laterality Date   BREAST SURGERY     lumpectomy   EYE SURGERY Bilateral    cataracts   LUNG BIOPSY  08/31/11   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   LUNG BIOPSY  05/22/2009   RUL lung=Adenocarcinoma   MASTECTOMY  1991   right   TONSILLECTOMY  1955    reports that she quit smoking about 13 years ago. Her smoking use included cigarettes. She started smoking about 64 years ago. She has a 50 pack-year smoking history. She has never used smokeless tobacco. She reports current alcohol use of about 2.0 standard drinks of alcohol per week. She reports that she does not use drugs. Social  History   Socioeconomic History   Marital status: Widowed    Spouse name: Not on file   Number of children: Not on file   Years of education: Not on file   Highest education level: 12th grade  Occupational History   Occupation: Retired (worked Public librarian)    Employer: RETIRED  Tobacco Use   Smoking status: Former    Current packs/day: 0.00    Average packs/day: 1 pack/day for 50.0 years (50.0 ttl pk-yrs)    Types: Cigarettes     Start date: 08/04/1959    Quit date: 08/03/2009    Years since quitting: 13.9   Smokeless tobacco: Never  Vaping Use   Vaping status: Never Used  Substance and Sexual Activity   Alcohol use: Yes    Alcohol/week: 2.0 standard drinks of alcohol    Types: 2 Glasses of wine per week   Drug use: No   Sexual activity: Not on file  Other Topics Concern   Not on file  Social History Narrative   Widowed 2005   Quit smoking 2010   No other exposures, no TB hx   Social Drivers of Corporate investment banker Strain: Low Risk  (05/10/2022)   Overall Financial Resource Strain (CARDIA)    Difficulty of Paying Living Expenses: Not hard at all  Food Insecurity: No Food Insecurity (07/10/2023)   Hunger Vital Sign    Worried About Running Out of Food in the Last Year: Never true    Ran Out of Food in the Last Year: Never true  Transportation Needs: No Transportation Needs (07/10/2023)   PRAPARE - Administrator, Civil Service (Medical): No    Lack of Transportation (Non-Medical): No  Physical Activity: Unknown (05/10/2022)   Exercise Vital Sign    Days of Exercise per Week: 0 days    Minutes of Exercise per Session: Not on file  Recent Concern: Physical Activity - Inactive (05/10/2022)   Exercise Vital Sign    Days of Exercise per Week: 0 days    Minutes of Exercise per Session: 0 min  Stress: No Stress Concern Present (05/10/2022)   [employer] of Occupational Health - Occupational Stress Questionnaire    Feeling of Stress : Not at all  Social Connections: Unknown (05/10/2022)   Social Connection and Isolation Panel [NHANES]    Frequency of Communication with Friends and Family: Twice a week    Frequency of Social Gatherings with Friends and Family: Patient declined    Attends Religious Services: Patient declined    Database administrator or Organizations: No    Attends Engineer, structural: Not on file    Marital Status: Widowed  Intimate Partner Violence: Unknown  (07/10/2023)   Humiliation, Afraid, Rape, and Kick questionnaire    Fear of Current or Ex-Partner: No    Emotionally Abused: Not on file    Physically Abused: No    Sexually Abused: No    Functional Status Survey:    Family History  Problem Relation Age of Onset   Heart attack Mother 54   Heart disease Mother    Cancer Sister        "throat" cancer   Heart disease Father    Cancer Brother        blood   Heart disease Sister    Cancer Brother        blood   Heart disease Other        CAD   Hypertension Other  Colon cancer Neg Hx    Stomach cancer Neg Hx     Health Maintenance  Topic Date Due   Zoster Vaccines- Shingrix (1 of 2) 02/11/1951   FOOT EXAM  07/21/2018   OPHTHALMOLOGY EXAM  03/19/2022   COVID-19 Vaccine (5 - 2024-25 season) 04/04/2023   Medicare Annual Wellness (AWV)  04/15/2023   HEMOGLOBIN A1C  12/28/2023   DTaP/Tdap/Td (3 - Td or Tdap) 11/14/2024   Pneumonia Vaccine 73+ Years old  Completed   INFLUENZA VACCINE  Completed   DEXA SCAN  Completed   HPV VACCINES  Aged Out   Colonoscopy  Discontinued    Allergies  Allergen Reactions   Crestor [Rosuvastatin Calcium]     cramping    Outpatient Encounter Medications as of 07/22/2023  Medication Sig   acetaminophen (TYLENOL) 500 MG tablet Take 500 mg by mouth every 6 (six) hours as needed for headache (pain).    albuterol (VENTOLIN HFA) 108 (90 Base) MCG/ACT inhaler Inhale 2 puffs into the lungs every 6 (six) hours as needed for wheezing or shortness of breath.   budesonide (PULMICORT) 0.25 MG/2ML nebulizer solution USE (0.25 MG TOTAL) BY NEBULIZATION TWICE A DAY.   Calcium Carb-Cholecalciferol (CALCIUM 1000 + D PO) Take 2 tablets by mouth daily.   FERROUS SULFATE PO Take 1 tablet by mouth every morning.   levothyroxine (SYNTHROID) 50 MCG tablet TAKE 1 TABLET BY MOUTH EVERY DAY BEFORE BREAKFAST   losartan (COZAAR) 100 MG tablet Take 1 tablet (100 mg total) by mouth daily.   metFORMIN  (GLUCOPHAGE-XR) 500 MG 24 hr tablet Take 1 tablet (500 mg total) by mouth daily.   metoprolol succinate (TOPROL-XL) 25 MG 24 hr tablet Take 1 tablet (25 mg total) by mouth daily.   NIFEdipine (PROCARDIA XL/NIFEDICAL-XL) 90 MG 24 hr tablet Take 1 tablet (90 mg total) by mouth daily.   pravastatin (PRAVACHOL) 40 MG tablet Place 1 tablet (40 mg total) into feeding tube daily at 6 PM.   senna-docusate (SENOKOT-S) 8.6-50 MG tablet Take 1 tablet by mouth at bedtime as needed for mild constipation.   Tiotropium Bromide-Olodaterol 2.5-2.5 MCG/ACT AERS Inhale 2 puffs into the lungs every morning.   vitamin B-12 (CYANOCOBALAMIN) 100 MCG tablet Take 100 mcg by mouth daily.   No facility-administered encounter medications on file as of 07/22/2023.    Review of Systems  Constitutional:  Positive for activity change. Negative for appetite change.  HENT: Negative.    Respiratory:  Negative for cough and shortness of breath.   Cardiovascular:  Negative for leg swelling.  Gastrointestinal:  Negative for constipation.  Genitourinary: Negative.   Musculoskeletal:  Positive for gait problem. Negative for arthralgias and myalgias.  Skin: Negative.   Neurological:  Positive for weakness. Negative for dizziness.  Psychiatric/Behavioral:  Negative for confusion, dysphoric mood and sleep disturbance.     Vitals:   07/22/23 1312  BP: 112/69  Pulse: 98  Resp: (!) 22  Temp: (!) 97.5 F (36.4 C)  SpO2: 98%  Weight: 93 lb 8 oz (42.4 kg)   Body mass index is 15.56 kg/m. Physical Exam Vitals reviewed.  Constitutional:      Comments: Very frail  HENT:     Head: Normocephalic.     Nose: Nose normal.     Mouth/Throat:     Mouth: Mucous membranes are moist.     Pharynx: Oropharynx is clear.  Eyes:     Pupils: Pupils are equal, round, and reactive to light.  Cardiovascular:  Rate and Rhythm: Normal rate and regular rhythm.     Pulses: Normal pulses.     Heart sounds: Normal heart sounds. No murmur  heard. Pulmonary:     Effort: Pulmonary effort is normal.     Breath sounds: Normal breath sounds.  Abdominal:     General: Abdomen is flat. Bowel sounds are normal.     Palpations: Abdomen is soft.  Musculoskeletal:        General: No swelling.     Cervical back: Neck supple.  Skin:    General: Skin is warm.  Neurological:     Mental Status: She is alert and oriented to person, place, and time.     Comments: Left sided weakness 3/5 in both Upper and LE  Psychiatric:        Mood and Affect: Mood normal.        Thought Content: Thought content normal.     Labs reviewed: Basic Metabolic Panel: Recent Labs    07/10/23 0507 07/10/23 1705 07/11/23 0447 07/12/23 0421 07/13/23 0447 07/14/23 0539 07/22/23 0000  NA 140  --  141 143 141 139 139  K 3.5  --  3.8 3.8 4.0 3.8 4.4  CL 92*  --  94* 96* 96* 98 92*  CO2 37*  --  37* 39* 32 36* 41*  GLUCOSE 153*  --  180* 119* 171* 138*  --   BUN 27*  --  31* 34* 35* 24* 20  CREATININE 0.64  --  0.72 0.64 0.64 0.84 0.6  CALCIUM 10.0  --  9.3 9.2 9.3 9.2 10.0  MG 2.1 2.2 2.2  --   --   --   --   PHOS 2.7 3.2 2.5  --   --   --   --    Liver Function Tests: Recent Labs    06/30/23 1107 07/08/23 1518  AST 18 41  ALT 8 16  ALKPHOS 47 75  BILITOT 0.5 0.7  PROT 7.0 6.9  ALBUMIN 4.5 3.5   No results for input(s): "LIPASE", "AMYLASE" in the last 8760 hours. No results for input(s): "AMMONIA" in the last 8760 hours. CBC: Recent Labs    06/30/23 1107 07/08/23 1518 07/08/23 1538 07/14/23 0539 07/15/23 0650 07/16/23 0626 07/22/23 0000  WBC 7.4 12.2*   < > 12.9* 13.4* 14.1* 11.0  NEUTROABS 5.1 10.9*  --   --   --   --   --   HGB 9.6* 9.7*   < > 8.5* 8.5* 8.3* 8.5*  HCT 30.0* 32.7*   < > 28.1* 28.1* 27.5* 26*  MCV 98.7 100.6*   < > 100.4* 100.4* 101.1*  --   PLT 184.0 262   < > 315 368 336 410*   < > = values in this interval not displayed.   Cardiac Enzymes: Recent Labs    07/08/23 1518  CKTOTAL 452*   BNP: Invalid  input(s): "POCBNP" Lab Results  Component Value Date   HGBA1C 5.6 06/30/2023   Lab Results  Component Value Date   TSH 1.040 07/08/2023   Lab Results  Component Value Date   VITAMINB12 849 02/28/2021   Lab Results  Component Value Date   FOLATE 13.6 02/28/2021   Lab Results  Component Value Date   IRON 51 03/06/2021   TIBC 338 03/06/2021   FERRITIN 15.1 03/06/2021    Imaging and Procedures obtained prior to SNF admission: DG Abd Portable 1V Result Date: 07/09/2023 CLINICAL DATA:  Feeding tube placement EXAM:  PORTABLE ABDOMEN - 1 VIEW COMPARISON:  None Available. FINDINGS: Enteric tube tip overlies the mid stomach. Some air distended bowel in the upper abdomen IMPRESSION: Enteric tube tip overlies the mid stomach. Electronically Signed   By: Jasmine Pang M.D.   On: 07/09/2023 16:52   ECHOCARDIOGRAM COMPLETE Result Date: 07/09/2023    ECHOCARDIOGRAM REPORT   Patient Name:   MAYLENA ZAHLER Date of Exam: 07/09/2023 Medical Rec #:  [id number]          Height:       65.0 in Accession #:    [id number]         Weight:       59.7 lb Date of Birth:  1931/09/26          BSA:          1.184 m Patient Age:    89 years           BP:           128/64 mmHg Patient Gender: F                  HR:           116 bpm. Exam Location:  Inpatient Procedure: 2D Echo, Cardiac Doppler and Color Doppler Indications:     Stroke I63.9  History:         Patient has prior history of Echocardiogram examinations, most                  recent 07/19/2018. COPD; Risk Factors:Former Smoker, Diabetes                  and Hypertension.  Sonographer:     Dondra Prader RVT RCS Referring Phys:  [id number] [facility] Diagnosing Phys: [facility]  Sonographer Comments: Technically challenging study due to limited acoustic windows and Technically difficult study due to poor echo windows. HR from 110-130's IMPRESSIONS  1. Left ventricular ejection fraction, by estimation, is 60 to 65%. The left ventricle has normal function. The left  ventricle has no regional wall motion abnormalities. There is mild left ventricular hypertrophy. Indeterminate diastolic filling due to E-A fusion.  2. Right ventricular systolic function is normal. The right ventricular size is normal. There is moderately elevated pulmonary artery systolic pressure. The estimated right ventricular systolic pressure is 48.2 mmHg.  3. The mitral valve is myxomatous. No evidence of mitral valve regurgitation.  4. No significant AS. There is moderate calcification of the aortic valve. Aortic valve regurgitation is not visualized.  5. The inferior vena cava is dilated in size with >50% respiratory variability, suggesting right atrial pressure of 8 mmHg. Conclusion(s)/Recommendation(s): No intracardiac source of embolism detected on this transthoracic study. Consider a transesophageal echocardiogram to exclude cardiac source of embolism if clinically indicated. FINDINGS  Left Ventricle: Left ventricular ejection fraction, by estimation, is 60 to 65%. The left ventricle has normal function. The left ventricle has no regional wall motion abnormalities. The left ventricular internal cavity size was normal in size. There is  mild left ventricular hypertrophy. Indeterminate diastolic filling due to E-A fusion. Right Ventricle: The right ventricular size is normal. Right ventricular systolic function is normal. There is moderately elevated pulmonary artery systolic pressure. The tricuspid regurgitant velocity is 3.17 m/s, and with an assumed right atrial pressure of 8 mmHg, the estimated right ventricular systolic pressure is 48.2 mmHg. Left Atrium: Left atrial size was normal in size. Right Atrium: Right atrial size was normal in size. Pericardium:  There is no evidence of pericardial effusion. Mitral Valve: The mitral valve is myxomatous. No evidence of mitral valve regurgitation. Tricuspid Valve: Tricuspid valve regurgitation is mild. Aortic Valve: No significant AS. There is moderate  calcification of the aortic valve. Aortic valve regurgitation is not visualized. Aortic valve mean gradient measures 7.4 mmHg. Aortic valve peak gradient measures 13.8 mmHg. Aortic valve area, by VTI measures 1.68 cm. Pulmonic Valve: Pulmonic valve regurgitation is not visualized. Aorta: The aortic root and ascending aorta are structurally normal, with no evidence of dilitation. Venous: The inferior vena cava is dilated in size with greater than 50% respiratory variability, suggesting right atrial pressure of 8 mmHg. IAS/Shunts: No atrial level shunt detected by color flow Doppler.  LEFT VENTRICLE PLAX 2D LVIDd:         3.70 cm   Diastology LVIDs:         2.60 cm   LV e' medial:    7.18 cm/s LV PW:         1.10 cm   LV E/e' medial:  15.6 LV IVS:        0.90 cm   LV e' lateral:   15.60 cm/s LVOT diam:     1.80 cm   LV E/e' lateral: 7.2 LV SV:         46 LV SV Index:   39 LVOT Area:     2.54 cm  RIGHT VENTRICLE             IVC RV Basal diam:  3.00 cm     IVC diam: 2.20 cm RV S prime:     13.90 cm/s TAPSE (M-mode): 2.4 cm LEFT ATRIUM             Index        RIGHT ATRIUM           Index LA diam:        2.50 cm 2.11 cm/m   RA Area:     15.40 cm LA Vol (A2C):   26.8 ml 22.64 ml/m  RA Volume:   41.30 ml  34.89 ml/m LA Vol (A4C):   26.9 ml 22.73 ml/m LA Biplane Vol: 28.5 ml 24.08 ml/m  AORTIC VALVE                     PULMONIC VALVE AV Area (Vmax):    1.85 cm      PV Vmax:       1.34 m/s AV Area (Vmean):   1.87 cm      PV Peak grad:  7.2 mmHg AV Area (VTI):     1.68 cm AV Vmax:           185.60 cm/s AV Vmean:          124.300 cm/s AV VTI:            0.272 m AV Peak Grad:      13.8 mmHg AV Mean Grad:      7.4 mmHg LVOT Vmax:         135.20 cm/s LVOT Vmean:        91.140 cm/s LVOT VTI:          0.179 m LVOT/AV VTI ratio: 0.66  AORTA Ao Root diam: 2.60 cm Ao Asc diam:  3.70 cm MITRAL VALVE                TRICUSPID VALVE MV Area (PHT): 5.31 cm     TR Peak grad:  40.2 mmHg MV Decel Time: 143 msec     TR Vmax:         317.00 cm/s MV E velocity: 112.00 cm/s MV A velocity: 133.00 cm/s  SHUNTS MV E/A ratio:  0.84         Systemic VTI:  0.18 m                             Systemic Diam: 1.80 cm Carolan Clines Electronically signed by Carolan Clines Signature Date/Time: 07/09/2023/2:20:52 PM    Final (Updated)    CT Angio Chest Pulmonary Embolism (PE) W or WO Contrast Result Date: 07/09/2023 CLINICAL DATA:  89 year old female with intracranial hemorrhage. High probability of pulmonary embolus. EXAM: CT ANGIOGRAPHY CHEST WITH CONTRAST TECHNIQUE: Multidetector CT imaging of the chest was performed using the standard protocol during bolus administration of intravenous contrast. Multiplanar CT image reconstructions and MIPs were obtained to evaluate the vascular anatomy. RADIATION DOSE REDUCTION: This exam was performed according to the departmental dose-optimization program which includes automated exposure control, adjustment of the mA and/or kV according to patient size and/or use of iterative reconstruction technique. CONTRAST:  67mL OMNIPAQUE IOHEXOL 350 MG/ML SOLN COMPARISON:  CTA chest 07/18/2018. FINDINGS: Cardiovascular: Adequate contrast bolus timing in the pulmonary arterial tree. Mild respiratory motion. Some central pulmonary artery enlargement bilaterally. No pulmonary artery filling defect identified. Extensive Calcified aortic atherosclerosis. Extensive calcified coronary artery atherosclerosis and/or stents. Heart size stable since 2019 at the upper limits of normal. No pericardial effusion. Mediastinum/Nodes: No mediastinal mass or lymphadenopathy. Lungs/Pleura: Chronic emphysema. Bulky chronic anterior right lung architectural distortion, atelectasis or scarring in association with abnormal right anterior 2nd rib there, no change from 2019. Layering secretions in the trachea now series 8, image 48 and widespread patchy and irregular left lung multilobar peribronchial opacity which is most pronounced in the lower lobe. The  lobar bronchi are abnormally thickened and/or opacified there. No similar acute changes in the right lung. No pleural effusion. Upper Abdomen: Negative visible noncontrast liver, spleen, stomach. Chronic exophytic right renal simple fluid density cyst (no follow-up imaging recommended). Musculoskeletal: Stable visualized osseous structures. Chronically abnormal but stable right anterior 2nd rib. Underlying osteopenia. Dextroconvex scoliosis. No acute or suspicious osseous lesion identified. Review of the MIP images confirms the above findings. IMPRESSION: 1. Negative for acute pulmonary embolus. 2. Emphysema (ICD10-J43.9). Multilobar acute left lung patchy and irregular peribronchial opacity with abnormally thickened and opacified airways, some retained secretions in the trachea. Top differential considerations are multilobar Aspiration (favored in this setting), Bronchopneumonia. No pleural effusion. 3. Chronic severe but stable right anterior 2nd rib and subjacent right lung parenchymal changes since 2019, compatible with benign or post treatment etiology. 4. Aortic Atherosclerosis (ICD10-I70.0). Coronary artery atherosclerosis. Electronically Signed   By: Odessa Fleming M.D.   On: 07/09/2023 10:42   MR BRAIN W WO CONTRAST Result Date: 07/09/2023 CLINICAL DATA:  Hemorrhagic stroke EXAM: MRI HEAD WITHOUT AND WITH CONTRAST TECHNIQUE: Multiplanar, multiecho pulse sequences of the brain and surrounding structures were obtained without and with intravenous contrast. CONTRAST:  5mL GADAVIST GADOBUTROL 1 MMOL/ML IV SOLN COMPARISON:  Head CT 07/09/2023 FINDINGS: Brain: Unchanged right thalamic and frontal white matter intraparenchymal hematomas with intraventricular extension. Magnetic susceptibility effects from the blood limit the assessment for acute ischemia on diffusion-weighted imaging. No abnormal diffusion restriction elsewhere within the brain. Chronic microhemorrhage in the left frontal lobe. There is confluent  white matter hyperintense T2-weighted signal consistent  with chronic ischemic microangiopathy. Unchanged anterior parafalcine meningioma. There is no abnormal contrast enhancement within the brain. Vascular: Normal flow voids Skull and upper cervical spine: Normal marrow signal. Sinuses/Orbits: Negative. Other: None. IMPRESSION: 1. Unchanged right thalamic and frontal white matter intraparenchymal hematomas with intraventricular extension. 2. No acute intracranial abnormality. 3. Unchanged anterior parafalcine meningioma. Electronically Signed   By: Deatra Robinson M.D.   On: 07/09/2023 04:07   CT HEAD WO CONTRAST ( ) Result Date: 07/09/2023 CLINICAL DATA:  Hemorrhage follow-up EXAM: CT HEAD WITHOUT CONTRAST TECHNIQUE: Contiguous axial images were obtained from the base of the skull through the vertex without intravenous contrast. RADIATION DOSE REDUCTION: This exam was performed according to the departmental dose-optimization program which includes automated exposure control, adjustment of the mA and/or kV according to patient size and/or use of iterative reconstruction technique. COMPARISON:  07/08/2023 head CT FINDINGS: Brain: Unchanged multifocal hemorrhage with intraparenchymal hematomas in the right frontal white matter and right thalamus, extending into the right lateral ventricle. Left anterior para falcine meningioma is unchanged. Size and configuration of the ventricles is unchanged. There is mass effect on the right lateral ventricle but no midline shift. Vascular: Carotid and vertebral artery atherosclerosis at the skull base. Skull: Negative Sinuses/Orbits: Paranasal sinuses are clear. No mastoid effusion. Normal orbits. Other: None. IMPRESSION: 1. Unchanged intraparenchymal hematomas in the right frontal white matter and right thalamus, extending into the right lateral ventricle. 2. Unchanged left anterior para falcine meningioma. Electronically Signed   By: Deatra Robinson M.D.   On: 07/09/2023 03:14    DG Chest Portable 1 View Result Date: 07/08/2023 CLINICAL DATA:  Cough.  Evaluate for pneumonia. EXAM: PORTABLE CHEST 1 VIEW COMPARISON:  Chest radiograph dated 07/18/2018. CT dated 07/18/2018. FINDINGS: Background of emphysema. No focal consolidation, pleural effusion, pneumothorax. The cardiac silhouette is within normal limits. Atherosclerotic calcification of the aorta. Degenerative changes spine and scoliosis. Calcification of the right anterior chest wall. Multiple right axillary surgical clips. No acute osseous pathology. IMPRESSION: 1. No active disease. 2. Emphysema. Electronically Signed   By: Elgie Collard M.D.   On: 07/08/2023 17:30   CT ANGIO HEAD NECK W WO CM (CODE STROKE) Result Date: 07/08/2023 CLINICAL DATA:  Stroke suspected, intracranial hemorrhage on noncontrast CT EXAM: CT ANGIOGRAPHY HEAD AND NECK WITH AND WITHOUT CONTRAST TECHNIQUE: Multidetector CT imaging of the head and neck was performed using the standard protocol during bolus administration of intravenous contrast. Multiplanar CT image reconstructions and MIPs were obtained to evaluate the vascular anatomy. Carotid stenosis measurements (when applicable) are obtained utilizing NASCET criteria, using the distal internal carotid diameter as the denominator. RADIATION DOSE REDUCTION: This exam was performed according to the departmental dose-optimization program which includes automated exposure control, adjustment of the mA and/or kV according to patient size and/or use of iterative reconstruction technique. CONTRAST:  75mL OMNIPAQUE IOHEXOL 350 MG/ML SOLN COMPARISON:  No prior CTA available, correlation is made with 07/08/2023 CT head FINDINGS: CT HEAD FINDINGS For noncontrast findings, please see same day CT head. No evidence of active extravasation into the right thalamic hematoma. CTA NECK FINDINGS Evaluation is somewhat limited by motion artifact. Aortic arch: The aortic arch and branch vessel origins are incompletely  included in the field of view. Within this limitation, no evidence of dissection or aneurysm. Aortic atherosclerosis. Right carotid system: No evidence of dissection, occlusion, or hemodynamically significant stenosis (greater than 50%). Left carotid system: No evidence of dissection, occlusion, or hemodynamically significant stenosis (greater than 50%). Atherosclerotic disease at the bifurcation  and in the proximal ICA is not hemodynamically significant. Vertebral arteries: Left dominant system. Fusiform dilatation of the left V3 segment, which measures up to 10 mm (series 7, image 189), compared to the 4-5 mm diameter of the more proximal and distal left vertebral artery. The left vertebral artery is otherwise patent the skull base, without evidence of dissection. Mild stenosis at the origin of the right vertebral artery, which is otherwise patent to the skull base. No evidence of dissection Skeleton: No acute osseous abnormality. Degenerative changes in the cervical spine. Other neck: No acute finding. Upper chest: No pleural effusion. Centrilobular and paraseptal emphysema. Previously noted peripheral consolidation in the right upper lobe is incompletely imaged. Review of the MIP images confirms the above findings CTA HEAD FINDINGS Anterior circulation: Both internal carotid arteries are patent to the termini, without significant stenosis. Wide necked, medially directed outpouching from the right cavernous ICA measures up to 4 x 3 x 4 mm (series 7, image 120). Lobulated, wide neck, medially directed outpouching from the left cavernous ICA measures up to 14 x 6 x 8 mm (series 7, image 122). A1 segments patent. Normal anterior communicating artery. Anterior cerebral arteries are patent to their distal aspects without significant stenosis. No M1 stenosis or occlusion. MCA branches perfused to their distal aspects without significant stenosis. Posterior circulation: Vertebral arteries patent to the vertebrobasilar  junction without significant stenosis. Basilar patent to its distal aspect without significant stenosis. Superior cerebellar arteries patent proximally. Patent P1 segments. PCAs perfused to their distal aspects without significant stenosis. Venous sinuses: As permitted by contrast timing, patent. Anatomic variants: None significant. No evidence of aneurysm or vascular malformation. Review of the MIP images confirms the above findings IMPRESSION: 1. No evidence of active extravasation into the right thalamic hematoma. 2. No intracranial large vessel occlusion or significant stenosis. 3. Bilateral cavernous ICA aneurysms, measuring up to 14 mm on the left and 4 mm on the right. 4. 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 5. No hemodynamically significant stenosis in the neck. 6. Aortic atherosclerosis. Aortic Atherosclerosis (ICD10-I70.0) and Emphysema (ICD10-J43.9). Electronically Signed   By: Wiliam Ke M.D.   On: 07/08/2023 16:21   CT HEAD CODE STROKE WO CONTRAST Result Date: 07/08/2023 CLINICAL DATA:  Code stroke.  Left-sided weakness EXAM: CT HEAD WITHOUT CONTRAST TECHNIQUE: Contiguous axial images were obtained from the base of the skull through the vertex without intravenous contrast. RADIATION DOSE REDUCTION: This exam was performed according to the departmental dose-optimization program which includes automated exposure control, adjustment of the mA and/or kV according to patient size and/or use of iterative reconstruction technique. COMPARISON:  Head CT 08/22/2008 FINDINGS: Brain: 2.4 x 1.7 cm right thalamic hemorrhage with intraventricular extension. There is a extensive periventricular white matter hypodensity, favored to represent sequela of severe chronic microvascular ischemic change. 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 There also age indeterminate infarcts  in the left basal ganglia. Vascular: No hyperdense vessel or unexpected calcification. Skull: Normal. Negative for fracture or focal lesion. Sinuses/Orbits: No middle ear or mastoid effusion. Paranasal sinuses are notable for an air-fluid level in the left maxillary sinus, which can be seen in the setting of acute sinusitis. Bilateral lens replacement. Orbits are otherwise unremarkable. Other: None. IMPRESSION: 1. 2.4 x 1.7 cm right thalamic hemorrhage with intraventricular extension. 2. 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 3. Age  indeterminate infarcts in the left basal ganglia. Findings were paged to Dr. Iver Nestle on 07/08/2023 at 3:41 p.m. Electronically Signed   By: Lorenza Cambridge M.D.   On: 07/08/2023 15:46    Assessment/Plan 1. Nontraumatic subcortical hemorrhage of right cerebral hemisphere (HCC) (Primary) Strict BP control On statin Therapy  Skilled level of care  2. Left-sided weakness Due to Above Therapy   3. Essential (primary) hypertension On Nifedipine, Cozaar, Metoprolol   4. Stage 4 very severe COPD by GOLD classification (HCC) On her baseline oxygen Also on Pulmicort and Stiolto  5. Iron deficiency anemia, unspecified iron deficiency anemia type On Iron Hgb done today was low but stable Continue to monitor Not candidate for more work due to her COPD  6. Hypothyroidism following radioiodine therapy TSH normal in hospital  7. Type II diabetes mellitus with neurological manifestations (HCC) On Metformin A1C was good CBGS in good range   8. Hyperlipidemia, unspecified hyperlipidemia type On statin LDL good in hospital    Family/ staff Communication:   Labs/tests ordered:

## 2023-07-23 DIAGNOSIS — R279 Unspecified lack of coordination: Secondary | ICD-10-CM | POA: Diagnosis not present

## 2023-07-23 DIAGNOSIS — R262 Difficulty in walking, not elsewhere classified: Secondary | ICD-10-CM | POA: Diagnosis not present

## 2023-07-23 DIAGNOSIS — I619 Nontraumatic intracerebral hemorrhage, unspecified: Secondary | ICD-10-CM | POA: Diagnosis not present

## 2023-07-23 DIAGNOSIS — R29898 Other symptoms and signs involving the musculoskeletal system: Secondary | ICD-10-CM | POA: Diagnosis not present

## 2023-07-23 DIAGNOSIS — I69854 Hemiplegia and hemiparesis following other cerebrovascular disease affecting left non-dominant side: Secondary | ICD-10-CM | POA: Diagnosis not present

## 2023-07-23 DIAGNOSIS — M6281 Muscle weakness (generalized): Secondary | ICD-10-CM | POA: Diagnosis not present

## 2023-07-23 DIAGNOSIS — E43 Unspecified severe protein-calorie malnutrition: Secondary | ICD-10-CM | POA: Diagnosis not present

## 2023-07-23 DIAGNOSIS — J449 Chronic obstructive pulmonary disease, unspecified: Secondary | ICD-10-CM | POA: Diagnosis not present

## 2023-07-23 DIAGNOSIS — R1319 Other dysphagia: Secondary | ICD-10-CM | POA: Diagnosis not present

## 2023-07-25 DIAGNOSIS — R29898 Other symptoms and signs involving the musculoskeletal system: Secondary | ICD-10-CM | POA: Diagnosis not present

## 2023-07-25 DIAGNOSIS — M6281 Muscle weakness (generalized): Secondary | ICD-10-CM | POA: Diagnosis not present

## 2023-07-25 DIAGNOSIS — I69854 Hemiplegia and hemiparesis following other cerebrovascular disease affecting left non-dominant side: Secondary | ICD-10-CM | POA: Diagnosis not present

## 2023-07-25 DIAGNOSIS — R279 Unspecified lack of coordination: Secondary | ICD-10-CM | POA: Diagnosis not present

## 2023-07-26 ENCOUNTER — Encounter: Payer: Self-pay | Admitting: Orthopedic Surgery

## 2023-07-26 ENCOUNTER — Non-Acute Institutional Stay (SKILLED_NURSING_FACILITY): Payer: Medicare Other | Admitting: Orthopedic Surgery

## 2023-07-26 DIAGNOSIS — J449 Chronic obstructive pulmonary disease, unspecified: Secondary | ICD-10-CM | POA: Diagnosis not present

## 2023-07-26 DIAGNOSIS — E43 Unspecified severe protein-calorie malnutrition: Secondary | ICD-10-CM | POA: Diagnosis not present

## 2023-07-26 DIAGNOSIS — R262 Difficulty in walking, not elsewhere classified: Secondary | ICD-10-CM | POA: Diagnosis not present

## 2023-07-26 DIAGNOSIS — M6281 Muscle weakness (generalized): Secondary | ICD-10-CM | POA: Diagnosis not present

## 2023-07-26 DIAGNOSIS — I69854 Hemiplegia and hemiparesis following other cerebrovascular disease affecting left non-dominant side: Secondary | ICD-10-CM | POA: Diagnosis not present

## 2023-07-26 DIAGNOSIS — F419 Anxiety disorder, unspecified: Secondary | ICD-10-CM | POA: Diagnosis not present

## 2023-07-26 DIAGNOSIS — R1319 Other dysphagia: Secondary | ICD-10-CM | POA: Diagnosis not present

## 2023-07-26 DIAGNOSIS — I619 Nontraumatic intracerebral hemorrhage, unspecified: Secondary | ICD-10-CM | POA: Diagnosis not present

## 2023-07-26 DIAGNOSIS — I610 Nontraumatic intracerebral hemorrhage in hemisphere, subcortical: Secondary | ICD-10-CM | POA: Diagnosis not present

## 2023-07-26 DIAGNOSIS — R29898 Other symptoms and signs involving the musculoskeletal system: Secondary | ICD-10-CM | POA: Diagnosis not present

## 2023-07-26 DIAGNOSIS — R531 Weakness: Secondary | ICD-10-CM

## 2023-07-26 MED ORDER — HYDROXYZINE PAMOATE 25 MG PO CAPS: 25.0000 mg | ORAL_CAPSULE | Freq: Two times a day (BID) | ORAL | Status: DC | PRN

## 2023-07-26 NOTE — Progress Notes (Signed)
Location:   [facility]  Nursing Home Room Number: 16-A Place of Service:  SNF [phone]) Provider:  Hazle Nordmann, NP  PCP: Karie Georges, MD  Patient Care Team: Karie Georges, MD as PCP - General (Family Medicine) Si Gaul, MD as Consulting Physician (Oncology) Dorothy Puffer, MD as Consulting Physician (Radiation Oncology) Meryl Dare, MD as Consulting Physician (Gastroenterology) Glenford Peers, OD as Referring Physician (Optometry)  Extended Emergency Contact Information Primary Emergency Contact: Shareef,Celie Mobile Phone: [phone] Relation: Daughter Secondary Emergency Contact: Schwalb,Richard Address: [address]          [place], [place] [address] [place] of [place] Phone: [phone] Relation: Son  Code Status:  DNR Goals of care: Advanced Directive information    07/26/2023   10:12 AM  Advanced Directives  Does Patient Have a Medical Advance Directive? Yes  Type of Estate agent of [place];Out of facility DNR (pink MOST or yellow form)  Does patient want to make changes to medical advance directive? No - Patient declined  Copy of Healthcare Power of Attorney in Chart? Yes - validated most recent copy scanned in chart (See row information)     Chief Complaint  Patient presents with   Acute Visit    Anxiety     HPI:  Pt is a 89 y.o. female seen today for acute visit due to increased anxiety.  She currently resides on the skilled nursing unit at [facility]. PMH: HTN,HLD, lung cancer s/p radiation RUL (2011), breast cancer s/p right mastectomy> no chemo/radiation (1991), COPD, hypothyroidism post radiation, OA, osteoporosis, and recent unstable gait.   12/21 & 12/22 she had episode of increased anxiety. Both episodes occurred around midnight. She felt she could not breathe. Nursing gave nebulizer during both episodes. O2 sats 98% on 4 liters oxygen. She admits to increased anxiety since  recent stroke and move to skilled nursing facility. Afebrile. Vitals stable.   Hospitalized 12/05-12/13 for left sided weakness due to ICH. CT head showed right thalamic hemorrhage with intravascular extension> confirmed by MRI. Enrolled in PT/OT. Remains on nifedipine, cozaar and metoprolol for BP control and statin.   Past Medical History:  Diagnosis Date   ADENOCARCINOMA, LUNG 07/03/2010   RUL   ASYMPTOMATIC POSTMENOPAUSAL STATUS 07/02/2009   Breast cancer (HCC) 1991   R mastectomy, no chemo or radiation   COLONIC POLYPS, HX OF 04/16/2007   COPD 05/17/2008   HEMATURIA, HX OF 04/16/2007   History of radiation therapy 07/30/09 to 08/09/09   RUL lung   HYPERLIPIDEMIA 04/16/2007   Hypertension    HYPERTENSION 04/16/2007   HYPOTHYROIDISM, POST-RADIATION 06/07/2008   Osteoarthritis    OSTEOPOROSIS 07/02/2009   Wrist fracture, left    Past Surgical History:  Procedure Laterality Date   BREAST SURGERY     lumpectomy   EYE SURGERY Bilateral    cataracts   LUNG BIOPSY  08/31/11   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   LUNG BIOPSY  05/22/2009   RUL lung=Adenocarcinoma   MASTECTOMY  1991   right   TONSILLECTOMY  1955    Allergies  Allergen Reactions   Crestor [Rosuvastatin Calcium]     cramping    Allergies as of 07/26/2023       Reactions   Crestor [rosuvastatin Calcium]    cramping        Medication List        Accurate as of July 26, 2023 11:02 AM. If you have any questions, ask your  nurse or doctor.          acetaminophen 500 MG tablet Commonly known as: TYLENOL Take 500 mg by mouth every 6 (six) hours as needed for headache (pain).   albuterol 108 (90 Base) MCG/ACT inhaler Commonly known as: VENTOLIN HFA Inhale 2 puffs into the lungs every 6 (six) hours as needed for wheezing or shortness of breath.   budesonide 0.25 MG/2ML nebulizer solution Commonly known as: PULMICORT USE (0.25 MG TOTAL) BY NEBULIZATION TWICE A DAY.   CALCIUM 1000 + D  PO Take 2 tablets by mouth daily.   FERROUS SULFATE PO Take 1 tablet by mouth every morning.   levothyroxine 50 MCG tablet Commonly known as: SYNTHROID TAKE 1 TABLET BY MOUTH EVERY DAY BEFORE BREAKFAST   losartan 100 MG tablet Commonly known as: COZAAR Take 1 tablet (100 mg total) by mouth daily.   metFORMIN 500 MG 24 hr tablet Commonly known as: GLUCOPHAGE-XR Take 1 tablet (500 mg total) by mouth daily.   metoprolol succinate 25 MG 24 hr tablet Commonly known as: TOPROL-XL Take 1 tablet (25 mg total) by mouth daily.   NIFEdipine 90 MG 24 hr tablet Commonly known as: PROCARDIA XL/NIFEDICAL-XL Take 1 tablet (90 mg total) by mouth daily.   pravastatin 40 MG tablet Commonly known as: PRAVACHOL Place 1 tablet (40 mg total) into feeding tube daily at 6 PM.   PRO-STAT PO Take 30 mLs by mouth daily.   senna-docusate 8.6-50 MG tablet Commonly known as: Senokot-S Take 1 tablet by mouth at bedtime as needed for mild constipation.   Tiotropium Bromide-Olodaterol 2.5-2.5 MCG/ACT Aers Inhale 2 puffs into the lungs every morning.   vitamin B-12 100 MCG tablet Commonly known as: CYANOCOBALAMIN Take 100 mcg by mouth daily.   zinc oxide 20 % ointment Apply 1 Application topically as needed for irritation.        Review of Systems  Constitutional:  Positive for activity change. Negative for appetite change.  HENT:  Negative for sore throat and trouble swallowing.   Respiratory:  Positive for shortness of breath and wheezing. Negative for cough.   Cardiovascular:  Negative for chest pain and leg swelling.  Gastrointestinal:  Negative for abdominal distention and abdominal pain.  Genitourinary:  Negative for dysuria.  Musculoskeletal:  Positive for gait problem.  Skin:  Negative for wound.  Neurological:  Positive for weakness. Negative for dizziness and headaches.  Psychiatric/Behavioral:  Negative for confusion, dysphoric mood and sleep disturbance. The patient is  nervous/anxious.     Immunization History  Administered Date(s) Administered   Fluad Quad(high Dose 65+) 05/26/2019, 05/14/2022   Fluad Trivalent(High Dose 65+) 07/15/2023   Influenza Whole 06/03/2009, 05/03/2010   Influenza-Unspecified 04/04/2011, 05/03/2013, 05/03/2014, 04/19/2017, 04/17/2018   PFIZER(Purple Top)SARS-COV-2 Vaccination 08/26/2019, 09/16/2019, 08/01/2020, 03/02/2021   Pneumococcal Conjugate-13 08/16/2014   Pneumococcal Polysaccharide-23 06/04/1997, 07/03/2010   Td 01/01/1998   Tdap 11/15/2014   Zoster, Live 02/13/2014   Pertinent  Health Maintenance Due  Topic Date Due   FOOT EXAM  07/21/2018   OPHTHALMOLOGY EXAM  03/19/2022   HEMOGLOBIN A1C  12/28/2023   INFLUENZA VACCINE  Completed   DEXA SCAN  Completed   Colonoscopy  Discontinued      03/25/2021    1:54 PM 04/08/2022    2:19 PM 04/14/2022    1:34 PM 05/28/2023    1:31 PM 07/19/2023    4:42 PM  Fall Risk  Falls in the past year? 0 0 0 0 1  Was there an  injury with Fall? 0  0 0 0  Fall Risk Category Calculator 0  0 0 1  Fall Risk Category (Retired) Low  Low    (RETIRED) Patient Fall Risk Level   Low fall risk    Patient at Risk for Falls Due to Impaired vision  Medication side effect Impaired mobility History of fall(s);Impaired balance/gait;Impaired mobility  Fall risk Follow up Falls prevention discussed  Falls prevention discussed;Education provided;Falls evaluation completed Falls evaluation completed Falls evaluation completed;Education provided;Falls prevention discussed   Functional Status Survey:    Vitals:   07/26/23 1005  BP: (!) 105/56  Pulse: 88  Resp: 17  Temp: 98.7 F (37.1 C)  SpO2: 95%  Weight: 93 lb 8 oz (42.4 kg)  Height: 5\' 5"  (1.651 m)   Body mass index is 15.56 kg/m. Physical Exam Vitals reviewed.  Constitutional:      General: She is not in acute distress.    Comments: Frail  Eyes:     General:        Right eye: No discharge.        Left eye: No discharge.   Cardiovascular:     Rate and Rhythm: Normal rate and regular rhythm.     Pulses: Normal pulses.     Heart sounds: Normal heart sounds.  Pulmonary:     Effort: Pulmonary effort is normal.     Breath sounds: Normal breath sounds.  Abdominal:     General: Bowel sounds are normal.     Palpations: Abdomen is soft.  Musculoskeletal:     Cervical back: Neck supple.     Right lower leg: No edema.     Left lower leg: No edema.  Skin:    General: Skin is warm.     Capillary Refill: Capillary refill takes less than 2 seconds.  Neurological:     General: No focal deficit present.     Mental Status: She is alert.     Comments: Left sided weakness  Psychiatric:        Mood and Affect: Mood normal.     Labs reviewed: Recent Labs    07/10/23 0507 07/10/23 1705 07/11/23 0447 07/12/23 0421 07/13/23 0447 07/14/23 0539 07/22/23 0000  NA 140  --  141 143 141 139 139  K 3.5  --  3.8 3.8 4.0 3.8 4.4  CL 92*  --  94* 96* 96* 98 92*  CO2 37*  --  37* 39* 32 36* 41*  GLUCOSE 153*  --  180* 119* 171* 138*  --   BUN 27*  --  31* 34* 35* 24* 20  CREATININE 0.64  --  0.72 0.64 0.64 0.84 0.6  CALCIUM 10.0  --  9.3 9.2 9.3 9.2 10.0  MG 2.1 2.2 2.2  --   --   --   --   PHOS 2.7 3.2 2.5  --   --   --   --    Recent Labs    06/30/23 1107 07/08/23 1518  AST 18 41  ALT 8 16  ALKPHOS 47 75  BILITOT 0.5 0.7  PROT 7.0 6.9  ALBUMIN 4.5 3.5   Recent Labs    06/30/23 1107 07/08/23 1518 07/08/23 1538 07/14/23 0539 07/15/23 0650 07/16/23 0626 07/22/23 0000  WBC 7.4 12.2*   < > 12.9* 13.4* 14.1* 11.0  NEUTROABS 5.1 10.9*  --   --   --   --   --   HGB 9.6* 9.7*   < > 8.5* 8.5*  8.3* 8.5*  HCT 30.0* 32.7*   < > 28.1* 28.1* 27.5* 26*  MCV 98.7 100.6*   < > 100.4* 100.4* 101.1*  --   PLT 184.0 262   < > 315 368 336 410*   < > = values in this interval not displayed.   Lab Results  Component Value Date   TSH 1.040 07/08/2023   Lab Results  Component Value Date   HGBA1C 5.6 06/30/2023    Lab Results  Component Value Date   CHOL 189 06/30/2023   HDL 86.40 06/30/2023   LDLCALC 88 06/30/2023   LDLDIRECT 155.6 07/11/2013   TRIG 75.0 06/30/2023   CHOLHDL 2 06/30/2023    Significant Diagnostic Results in last 30 days:  DG Swallowing Func-Speech Pathology Result Date: 07/12/2023 Table formatting from the original result was not included. Modified Barium Swallow Study Patient Details Name: MEGGEN ANKENBAUER MRN: [id number] Date of Birth: 03-21-1932 Today's Date: 07/12/2023 HPI/PMH: HPI: 89 year old female with prior history of adenocarcinoma of lung, breast cancer, hypertension hyperlipidemia, chronic hypoxic respiratory failure due to severe COPD on home oxygen who presented with right thalamic intraparenchymal hemorrhage with IV. Clinical Impression: Clinical Impression: Pt demonstrates airway protection throughout study, no signs of stroke-related neuromuscular changes to swallow mechanism. Timing WNL, no residue or weakness. Pt does have a prominent cricopharyngeus, neither pill nor solids lodged there. Esophageal sweep showed some distal stasis with puree, pill. Pt is still easily fatigued and was moderately tachypneic during exam though O2 sats remained stable (did not test all textures due to pt fatigue). Recommend pt resume a regular diet and thin liquids. May want to continue cortrak for supplemented nutrition as pts respiratory endurance at meals may be limited. Factors that may increase risk of adverse event in presence of aspiration Rubye Oaks & Clearance Coots 2021): Factors that may increase risk of adverse event in presence of aspiration Rubye Oaks & Clearance Coots 2021): Respiratory or GI disease; Frail or deconditioned Recommendations/Plan: Swallowing Evaluation Recommendations Swallowing Evaluation Recommendations Recommendations: PO diet PO Diet Recommendation: Regular; Thin liquids (Level 0) Liquid Administration via: Cup; Straw Medication Administration: Whole meds with liquid Supervision:  Staff to assist with self-feeding Swallowing strategies  : Slow rate; Small bites/sips Postural changes: Position pt fully upright for meals; Stay upright 30-60 min after meals Treatment Plan Treatment Plan Treatment recommendations: Therapy as outlined in treatment plan below Follow-up recommendations: Skilled nursing-short term rehab (<3 hours/day) Treatment frequency: Min 2x/week Treatment duration: 1 week Interventions: Aspiration precaution training; Patient/family education Recommendations Recommendations for follow up therapy are one component of a multi-disciplinary discharge planning process, led by the attending physician.  Recommendations may be updated based on patient status, additional functional criteria and insurance authorization. Assessment: Orofacial Exam: Orofacial Exam Oral Cavity: Oral Hygiene: WFL Oral Cavity - Dentition: Adequate natural dentition Orofacial Anatomy: WFL Oral Motor/Sensory Function: WFL Anatomy: Anatomy: Other (Comment) (calcification in laryngeal cartilage?) Boluses Administered: No data recorded Oral Impairment Domain: Oral Impairment Domain Lip Closure: No labial escape Tongue control during bolus hold: Not tested Bolus preparation/mastication: Timely and efficient chewing and mashing Bolus transport/lingual motion: Brisk tongue motion Oral residue: Complete oral clearance Location of oral residue : N/A Initiation of pharyngeal swallow : Posterior angle of the ramus  Pharyngeal Impairment Domain: Pharyngeal Impairment Domain Soft palate elevation: No bolus between soft palate (SP)/pharyngeal wall (PW) Laryngeal elevation: Complete superior movement of thyroid cartilage with complete approximation of arytenoids to epiglottic petiole Anterior hyoid excursion: Complete anterior movement Epiglottic movement: Partial inversion Laryngeal vestibule closure: Incomplete, narrow  column air/contrast in laryngeal vestibule Pharyngeal stripping wave : Present - complete Tongue base  retraction: No contrast between tongue base and posterior pharyngeal wall (PPW) Pharyngeal residue: Complete pharyngeal clearance  Esophageal Impairment Domain: No data recorded Pill: No data recorded Penetration/Aspiration Scale Score: Penetration/Aspiration Scale Score 1.  Material does not enter airway: Thin liquids (Level 0); Puree; Solid Compensatory Strategies: Compensatory Strategies Compensatory strategies: No   General Information: Caregiver present: No  Diet Prior to this Study: NPO   Temperature : Normal   Respiratory Status: Tachypneic   Supplemental O2: Nasal cannula   History of Recent Intubation: No  Behavior/Cognition: Alert; Cooperative; Pleasant mood Self-Feeding Abilities: Needs assist with self-feeding Baseline vocal quality/speech: Normal Volitional Cough: Able to elicit Volitional Swallow: Able to elicit Exam Limitations: No limitations Goal Planning: Prognosis for improved oropharyngeal function: Fair Barriers to Reach Goals: Overall medical prognosis No data recorded No data recorded No data recorded Pain: Pain Assessment Pain Assessment: Faces Pain Score: 0 Faces Pain Scale: 4 Pain Location: generalized discomfort with increased work of breathing Pain Descriptors / Indicators: Discomfort Pain Intervention(s): Limited activity within patient's tolerance; Monitored during session; Repositioned End of Session: Start Time:SLP Start Time (ACUTE ONLY): 1240 Stop Time: SLP Stop Time (ACUTE ONLY): 1258 Time Calculation:SLP Time Calculation (min) (ACUTE ONLY): 18 min Charges: SLP Evaluations $ SLP Speech Visit: 1 Visit SLP Evaluations $MBS Swallow: 1 Procedure $Swallowing Treatment: 1 Procedure SLP visit diagnosis: SLP Visit Diagnosis: Dysphagia, unspecified (R13.10) Past Medical History: Past Medical History: Diagnosis Date  ADENOCARCINOMA, LUNG 07/03/2010  RUL  ASYMPTOMATIC POSTMENOPAUSAL STATUS 07/02/2009  Breast cancer (HCC) 1991  R mastectomy, no chemo or radiation  COLONIC POLYPS, HX OF  04/16/2007  COPD 05/17/2008  HEMATURIA, HX OF 04/16/2007  History of radiation therapy 07/30/09 to 08/09/09  RUL lung  HYPERLIPIDEMIA 04/16/2007  Hypertension   HYPERTENSION 04/16/2007  HYPOTHYROIDISM, POST-RADIATION 06/07/2008  Osteoarthritis   OSTEOPOROSIS 07/02/2009  Wrist fracture, left  Past Surgical History: Past Surgical History: Procedure Laterality Date  BREAST SURGERY    lumpectomy  EYE SURGERY Bilateral   cataracts  LUNG BIOPSY  08/31/11  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  LUNG BIOPSY  05/22/2009  RUL lung=Adenocarcinoma  MASTECTOMY  1991  right  TONSILLECTOMY  1955 [place], [name] 07/12/2023, 2:33 PM  DG Abd Portable 1V Result Date: 07/09/2023 CLINICAL DATA:  Feeding tube placement EXAM: PORTABLE ABDOMEN - 1 VIEW COMPARISON:  None Available. FINDINGS: Enteric tube tip overlies the mid stomach. Some air distended bowel in the upper abdomen IMPRESSION: Enteric tube tip overlies the mid stomach. Electronically Signed   By: Jasmine Pang M.D.   On: 07/09/2023 16:52   ECHOCARDIOGRAM COMPLETE Result Date: 07/09/2023    ECHOCARDIOGRAM REPORT   Patient Name:   MARYHELEN MCELENEY Date of Exam: 07/09/2023 Medical Rec #:  [id number]          Height:       65.0 in Accession #:    [id number]         Weight:       59.7 lb Date of Birth:  1932-03-23          BSA:          1.184 m Patient Age:    89 years           BP:           128/64 mmHg Patient Gender: F  HR:           116 bpm. Exam Location:  Inpatient Procedure: 2D Echo, Cardiac Doppler and Color Doppler Indications:     Stroke I63.9  History:         Patient has prior history of Echocardiogram examinations, most                  recent 07/19/2018. COPD; Risk Factors:Former Smoker, Diabetes                  and Hypertension.  Sonographer:     Dondra Prader RVT RCS Referring Phys:  [id number] [facility] Diagnosing Phys: [facility]  Sonographer Comments: Technically challenging study due to limited acoustic windows and Technically difficult  study due to poor echo windows. HR from 110-130's IMPRESSIONS  1. Left ventricular ejection fraction, by estimation, is 60 to 65%. The left ventricle has normal function. The left ventricle has no regional wall motion abnormalities. There is mild left ventricular hypertrophy. Indeterminate diastolic filling due to E-A fusion.  2. Right ventricular systolic function is normal. The right ventricular size is normal. There is moderately elevated pulmonary artery systolic pressure. The estimated right ventricular systolic pressure is 48.2 mmHg.  3. The mitral valve is myxomatous. No evidence of mitral valve regurgitation.  4. No significant AS. There is moderate calcification of the aortic valve. Aortic valve regurgitation is not visualized.  5. The inferior vena cava is dilated in size with >50% respiratory variability, suggesting right atrial pressure of 8 mmHg. Conclusion(s)/Recommendation(s): No intracardiac source of embolism detected on this transthoracic study. Consider a transesophageal echocardiogram to exclude cardiac source of embolism if clinically indicated. FINDINGS  Left Ventricle: Left ventricular ejection fraction, by estimation, is 60 to 65%. The left ventricle has normal function. The left ventricle has no regional wall motion abnormalities. The left ventricular internal cavity size was normal in size. There is  mild left ventricular hypertrophy. Indeterminate diastolic filling due to E-A fusion. Right Ventricle: The right ventricular size is normal. Right ventricular systolic function is normal. There is moderately elevated pulmonary artery systolic pressure. The tricuspid regurgitant velocity is 3.17 m/s, and with an assumed right atrial pressure of 8 mmHg, the estimated right ventricular systolic pressure is 48.2 mmHg. Left Atrium: Left atrial size was normal in size. Right Atrium: Right atrial size was normal in size. Pericardium: There is no evidence of pericardial effusion. Mitral Valve: The  mitral valve is myxomatous. No evidence of mitral valve regurgitation. Tricuspid Valve: Tricuspid valve regurgitation is mild. Aortic Valve: No significant AS. There is moderate calcification of the aortic valve. Aortic valve regurgitation is not visualized. Aortic valve mean gradient measures 7.4 mmHg. Aortic valve peak gradient measures 13.8 mmHg. Aortic valve area, by VTI measures 1.68 cm. Pulmonic Valve: Pulmonic valve regurgitation is not visualized. Aorta: The aortic root and ascending aorta are structurally normal, with no evidence of dilitation. Venous: The inferior vena cava is dilated in size with greater than 50% respiratory variability, suggesting right atrial pressure of 8 mmHg. IAS/Shunts: No atrial level shunt detected by color flow Doppler.  LEFT VENTRICLE PLAX 2D LVIDd:         3.70 cm   Diastology LVIDs:         2.60 cm   LV e' medial:    7.18 cm/s LV PW:         1.10 cm   LV E/e' medial:  15.6 LV IVS:        0.90 cm  LV e' lateral:   15.60 cm/s LVOT diam:     1.80 cm   LV E/e' lateral: 7.2 LV SV:         46 LV SV Index:   39 LVOT Area:     2.54 cm  RIGHT VENTRICLE             IVC RV Basal diam:  3.00 cm     IVC diam: 2.20 cm RV S prime:     13.90 cm/s TAPSE (M-mode): 2.4 cm LEFT ATRIUM             Index        RIGHT ATRIUM           Index LA diam:        2.50 cm 2.11 cm/m   RA Area:     15.40 cm LA Vol (A2C):   26.8 ml 22.64 ml/m  RA Volume:   41.30 ml  34.89 ml/m LA Vol (A4C):   26.9 ml 22.73 ml/m LA Biplane Vol: 28.5 ml 24.08 ml/m  AORTIC VALVE                     PULMONIC VALVE AV Area (Vmax):    1.85 cm      PV Vmax:       1.34 m/s AV Area (Vmean):   1.87 cm      PV Peak grad:  7.2 mmHg AV Area (VTI):     1.68 cm AV Vmax:           185.60 cm/s AV Vmean:          124.300 cm/s AV VTI:            0.272 m AV Peak Grad:      13.8 mmHg AV Mean Grad:      7.4 mmHg LVOT Vmax:         135.20 cm/s LVOT Vmean:        91.140 cm/s LVOT VTI:          0.179 m LVOT/AV VTI ratio: 0.66  AORTA Ao  Root diam: 2.60 cm Ao Asc diam:  3.70 cm MITRAL VALVE                TRICUSPID VALVE MV Area (PHT): 5.31 cm     TR Peak grad:   40.2 mmHg MV Decel Time: 143 msec     TR Vmax:        317.00 cm/s MV E velocity: 112.00 cm/s MV A velocity: 133.00 cm/s  SHUNTS MV E/A ratio:  0.84         Systemic VTI:  0.18 m                             Systemic Diam: 1.80 cm Carolan Clines Electronically signed by Carolan Clines Signature Date/Time: 07/09/2023/2:20:52 PM    Final (Updated)    CT Angio Chest Pulmonary Embolism (PE) W or WO Contrast Result Date: 07/09/2023 CLINICAL DATA:  89 year old female with intracranial hemorrhage. High probability of pulmonary embolus. EXAM: CT ANGIOGRAPHY CHEST WITH CONTRAST TECHNIQUE: Multidetector CT imaging of the chest was performed using the standard protocol during bolus administration of intravenous contrast. Multiplanar CT image reconstructions and MIPs were obtained to evaluate the vascular anatomy. RADIATION DOSE REDUCTION: This exam was performed according to the departmental dose-optimization program which includes automated exposure control, adjustment of the mA and/or kV according  to patient size and/or use of iterative reconstruction technique. CONTRAST:  67mL OMNIPAQUE IOHEXOL 350 MG/ML SOLN COMPARISON:  CTA chest 07/18/2018. FINDINGS: Cardiovascular: Adequate contrast bolus timing in the pulmonary arterial tree. Mild respiratory motion. Some central pulmonary artery enlargement bilaterally. No pulmonary artery filling defect identified. Extensive Calcified aortic atherosclerosis. Extensive calcified coronary artery atherosclerosis and/or stents. Heart size stable since 2019 at the upper limits of normal. No pericardial effusion. Mediastinum/Nodes: No mediastinal mass or lymphadenopathy. Lungs/Pleura: Chronic emphysema. Bulky chronic anterior right lung architectural distortion, atelectasis or scarring in association with abnormal right anterior 2nd rib there, no change from 2019.  Layering secretions in the trachea now series 8, image 48 and widespread patchy and irregular left lung multilobar peribronchial opacity which is most pronounced in the lower lobe. The lobar bronchi are abnormally thickened and/or opacified there. No similar acute changes in the right lung. No pleural effusion. Upper Abdomen: Negative visible noncontrast liver, spleen, stomach. Chronic exophytic right renal simple fluid density cyst (no follow-up imaging recommended). Musculoskeletal: Stable visualized osseous structures. Chronically abnormal but stable right anterior 2nd rib. Underlying osteopenia. Dextroconvex scoliosis. No acute or suspicious osseous lesion identified. Review of the MIP images confirms the above findings. IMPRESSION: 1. Negative for acute pulmonary embolus. 2. Emphysema (ICD10-J43.9). Multilobar acute left lung patchy and irregular peribronchial opacity with abnormally thickened and opacified airways, some retained secretions in the trachea. Top differential considerations are multilobar Aspiration (favored in this setting), Bronchopneumonia. No pleural effusion. 3. Chronic severe but stable right anterior 2nd rib and subjacent right lung parenchymal changes since 2019, compatible with benign or post treatment etiology. 4. Aortic Atherosclerosis (ICD10-I70.0). Coronary artery atherosclerosis. Electronically Signed   By: Odessa Fleming M.D.   On: 07/09/2023 10:42   MR BRAIN W WO CONTRAST Result Date: 07/09/2023 CLINICAL DATA:  Hemorrhagic stroke EXAM: MRI HEAD WITHOUT AND WITH CONTRAST TECHNIQUE: Multiplanar, multiecho pulse sequences of the brain and surrounding structures were obtained without and with intravenous contrast. CONTRAST:  5mL GADAVIST GADOBUTROL 1 MMOL/ML IV SOLN COMPARISON:  Head CT 07/09/2023 FINDINGS: Brain: Unchanged right thalamic and frontal white matter intraparenchymal hematomas with intraventricular extension. Magnetic susceptibility effects from the blood limit the assessment  for acute ischemia on diffusion-weighted imaging. No abnormal diffusion restriction elsewhere within the brain. Chronic microhemorrhage in the left frontal lobe. There is confluent white matter hyperintense T2-weighted signal consistent with chronic ischemic microangiopathy. Unchanged anterior parafalcine meningioma. There is no abnormal contrast enhancement within the brain. Vascular: Normal flow voids Skull and upper cervical spine: Normal marrow signal. Sinuses/Orbits: Negative. Other: None. IMPRESSION: 1. Unchanged right thalamic and frontal white matter intraparenchymal hematomas with intraventricular extension. 2. No acute intracranial abnormality. 3. Unchanged anterior parafalcine meningioma. Electronically Signed   By: Deatra Robinson M.D.   On: 07/09/2023 04:07   CT HEAD WO CONTRAST ( ) Result Date: 07/09/2023 CLINICAL DATA:  Hemorrhage follow-up EXAM: CT HEAD WITHOUT CONTRAST TECHNIQUE: Contiguous axial images were obtained from the base of the skull through the vertex without intravenous contrast. RADIATION DOSE REDUCTION: This exam was performed according to the departmental dose-optimization program which includes automated exposure control, adjustment of the mA and/or kV according to patient size and/or use of iterative reconstruction technique. COMPARISON:  07/08/2023 head CT FINDINGS: Brain: Unchanged multifocal hemorrhage with intraparenchymal hematomas in the right frontal white matter and right thalamus, extending into the right lateral ventricle. Left anterior para falcine meningioma is unchanged. Size and configuration of the ventricles is unchanged. There is mass effect on the right lateral ventricle  but no midline shift. Vascular: Carotid and vertebral artery atherosclerosis at the skull base. Skull: Negative Sinuses/Orbits: Paranasal sinuses are clear. No mastoid effusion. Normal orbits. Other: None. IMPRESSION: 1. Unchanged intraparenchymal hematomas in the right frontal white matter and  right thalamus, extending into the right lateral ventricle. 2. Unchanged left anterior para falcine meningioma. Electronically Signed   By: Deatra Robinson M.D.   On: 07/09/2023 03:14   DG Chest Portable 1 View Result Date: 07/08/2023 CLINICAL DATA:  Cough.  Evaluate for pneumonia. EXAM: PORTABLE CHEST 1 VIEW COMPARISON:  Chest radiograph dated 07/18/2018. CT dated 07/18/2018. FINDINGS: Background of emphysema. No focal consolidation, pleural effusion, pneumothorax. The cardiac silhouette is within normal limits. Atherosclerotic calcification of the aorta. Degenerative changes spine and scoliosis. Calcification of the right anterior chest wall. Multiple right axillary surgical clips. No acute osseous pathology. IMPRESSION: 1. No active disease. 2. Emphysema. Electronically Signed   By: Elgie Collard M.D.   On: 07/08/2023 17:30   CT ANGIO HEAD NECK W WO CM (CODE STROKE) Result Date: 07/08/2023 CLINICAL DATA:  Stroke suspected, intracranial hemorrhage on noncontrast CT EXAM: CT ANGIOGRAPHY HEAD AND NECK WITH AND WITHOUT CONTRAST TECHNIQUE: Multidetector CT imaging of the head and neck was performed using the standard protocol during bolus administration of intravenous contrast. Multiplanar CT image reconstructions and MIPs were obtained to evaluate the vascular anatomy. Carotid stenosis measurements (when applicable) are obtained utilizing NASCET criteria, using the distal internal carotid diameter as the denominator. RADIATION DOSE REDUCTION: This exam was performed according to the departmental dose-optimization program which includes automated exposure control, adjustment of the mA and/or kV according to patient size and/or use of iterative reconstruction technique. CONTRAST:  75mL OMNIPAQUE IOHEXOL 350 MG/ML SOLN COMPARISON:  No prior CTA available, correlation is made with 07/08/2023 CT head FINDINGS: CT HEAD FINDINGS For noncontrast findings, please see same day CT head. No evidence of active  extravasation into the right thalamic hematoma. CTA NECK FINDINGS Evaluation is somewhat limited by motion artifact. Aortic arch: The aortic arch and branch vessel origins are incompletely included in the field of view. Within this limitation, no evidence of dissection or aneurysm. Aortic atherosclerosis. Right carotid system: No evidence of dissection, occlusion, or hemodynamically significant stenosis (greater than 50%). Left carotid system: No evidence of dissection, occlusion, or hemodynamically significant stenosis (greater than 50%). Atherosclerotic disease at the bifurcation and in the proximal ICA is not hemodynamically significant. Vertebral arteries: Left dominant system. Fusiform dilatation of the left V3 segment, which measures up to 10 mm (series 7, image 189), compared to the 4-5 mm diameter of the more proximal and distal left vertebral artery. The left vertebral artery is otherwise patent the skull base, without evidence of dissection. Mild stenosis at the origin of the right vertebral artery, which is otherwise patent to the skull base. No evidence of dissection Skeleton: No acute osseous abnormality. Degenerative changes in the cervical spine. Other neck: No acute finding. Upper chest: No pleural effusion. Centrilobular and paraseptal emphysema. Previously noted peripheral consolidation in the right upper lobe is incompletely imaged. Review of the MIP images confirms the above findings CTA HEAD FINDINGS Anterior circulation: Both internal carotid arteries are patent to the termini, without significant stenosis. Wide necked, medially directed outpouching from the right cavernous ICA measures up to 4 x 3 x 4 mm (series 7, image 120). Lobulated, wide neck, medially directed outpouching from the left cavernous ICA measures up to 14 x 6 x 8 mm (series 7, image 122). A1 segments patent.  Normal anterior communicating artery. Anterior cerebral arteries are patent to their distal aspects without significant  stenosis. No M1 stenosis or occlusion. MCA branches perfused to their distal aspects without significant stenosis. Posterior circulation: Vertebral arteries patent to the vertebrobasilar junction without significant stenosis. Basilar patent to its distal aspect without significant stenosis. Superior cerebellar arteries patent proximally. Patent P1 segments. PCAs perfused to their distal aspects without significant stenosis. Venous sinuses: As permitted by contrast timing, patent. Anatomic variants: None significant. No evidence of aneurysm or vascular malformation. Review of the MIP images confirms the above findings IMPRESSION: 1. No evidence of active extravasation into the right thalamic hematoma. 2. No intracranial large vessel occlusion or significant stenosis. 3. Bilateral cavernous ICA aneurysms, measuring up to 14 mm on the left and 4 mm on the right. 4. 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 5. No hemodynamically significant stenosis in the neck. 6. Aortic atherosclerosis. Aortic Atherosclerosis (ICD10-I70.0) and Emphysema (ICD10-J43.9). Electronically Signed   By: Wiliam Ke M.D.   On: 07/08/2023 16:21   CT HEAD CODE STROKE WO CONTRAST Result Date: 07/08/2023 CLINICAL DATA:  Code stroke.  Left-sided weakness EXAM: CT HEAD WITHOUT CONTRAST TECHNIQUE: Contiguous axial images were obtained from the base of the skull through the vertex without intravenous contrast. RADIATION DOSE REDUCTION: This exam was performed according to the departmental dose-optimization program which includes automated exposure control, adjustment of the mA and/or kV according to patient size and/or use of iterative reconstruction technique. COMPARISON:  Head CT 08/22/2008 FINDINGS: Brain: 2.4 x 1.7 cm right thalamic hemorrhage with intraventricular extension. There is a extensive periventricular white matter  hypodensity, favored to represent sequela of severe chronic microvascular ischemic change. 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 There also age indeterminate infarcts in the left basal ganglia. Vascular: No hyperdense vessel or unexpected calcification. Skull: Normal. Negative for fracture or focal lesion. Sinuses/Orbits: No middle ear or mastoid effusion. Paranasal sinuses are notable for an air-fluid level in the left maxillary sinus, which can be seen in the setting of acute sinusitis. Bilateral lens replacement. Orbits are otherwise unremarkable. Other: None. IMPRESSION: 1. 2.4 x 1.7 cm right thalamic hemorrhage with intraventricular extension. 2. 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 3. Age indeterminate infarcts in the left basal ganglia. Findings were paged to Dr. Iver Nestle on 07/08/2023 at 3:41 p.m. Electronically Signed   By: Lorenza Cambridge M.D.   On: 07/08/2023 15:46    Assessment/Plan: 1. Anxiety (Primary) - noted 12/21 & 12/22 - increased anxiety and trouble breathing at night - ? Anxiety versus COPD/sob - O2 sats 98% during each episode - will trial hydroxyzine prn - if episodes occur more often, recommend Zoloft - hydrOXYzine (VISTARIL) 25 MG capsule; Take 1 capsule (25 mg total) by mouth 2 (two) times daily as needed.  2. Nontraumatic subcortical hemorrhage of right cerebral hemisphere [facility]) - hospitalized 12/05-12/13 - CT head right thalamic hemorrhage with intraventricular extension> confirmed by MRI - strict BP control - cont nifedipine, losartan and metoprolol - cont statin  - cont PT/OT  3. Left-sided weakness - now in SNF - see above   Family/ staff Communication: plan discussed with patient and nurse  Labs/tests ordered: none

## 2023-07-27 DIAGNOSIS — M6281 Muscle weakness (generalized): Secondary | ICD-10-CM | POA: Diagnosis not present

## 2023-07-27 DIAGNOSIS — I69854 Hemiplegia and hemiparesis following other cerebrovascular disease affecting left non-dominant side: Secondary | ICD-10-CM | POA: Diagnosis not present

## 2023-07-27 DIAGNOSIS — R262 Difficulty in walking, not elsewhere classified: Secondary | ICD-10-CM | POA: Diagnosis not present

## 2023-07-27 DIAGNOSIS — R29898 Other symptoms and signs involving the musculoskeletal system: Secondary | ICD-10-CM | POA: Diagnosis not present

## 2023-07-29 DIAGNOSIS — M6281 Muscle weakness (generalized): Secondary | ICD-10-CM | POA: Diagnosis not present

## 2023-07-29 DIAGNOSIS — R279 Unspecified lack of coordination: Secondary | ICD-10-CM | POA: Diagnosis not present

## 2023-07-29 DIAGNOSIS — R1319 Other dysphagia: Secondary | ICD-10-CM | POA: Diagnosis not present

## 2023-07-29 DIAGNOSIS — J449 Chronic obstructive pulmonary disease, unspecified: Secondary | ICD-10-CM | POA: Diagnosis not present

## 2023-07-29 DIAGNOSIS — E43 Unspecified severe protein-calorie malnutrition: Secondary | ICD-10-CM | POA: Diagnosis not present

## 2023-07-29 DIAGNOSIS — R262 Difficulty in walking, not elsewhere classified: Secondary | ICD-10-CM | POA: Diagnosis not present

## 2023-07-29 DIAGNOSIS — I619 Nontraumatic intracerebral hemorrhage, unspecified: Secondary | ICD-10-CM | POA: Diagnosis not present

## 2023-07-29 DIAGNOSIS — I69854 Hemiplegia and hemiparesis following other cerebrovascular disease affecting left non-dominant side: Secondary | ICD-10-CM | POA: Diagnosis not present

## 2023-07-29 DIAGNOSIS — R29898 Other symptoms and signs involving the musculoskeletal system: Secondary | ICD-10-CM | POA: Diagnosis not present

## 2023-07-30 ENCOUNTER — Non-Acute Institutional Stay (SKILLED_NURSING_FACILITY): Payer: Medicare Other | Admitting: Orthopedic Surgery

## 2023-07-30 ENCOUNTER — Encounter: Payer: Self-pay | Admitting: Orthopedic Surgery

## 2023-07-30 DIAGNOSIS — I619 Nontraumatic intracerebral hemorrhage, unspecified: Secondary | ICD-10-CM | POA: Diagnosis not present

## 2023-07-30 DIAGNOSIS — R1319 Other dysphagia: Secondary | ICD-10-CM | POA: Diagnosis not present

## 2023-07-30 DIAGNOSIS — Z0000 Encounter for general adult medical examination without abnormal findings: Secondary | ICD-10-CM

## 2023-07-30 DIAGNOSIS — J449 Chronic obstructive pulmonary disease, unspecified: Secondary | ICD-10-CM | POA: Diagnosis not present

## 2023-07-30 DIAGNOSIS — E43 Unspecified severe protein-calorie malnutrition: Secondary | ICD-10-CM | POA: Diagnosis not present

## 2023-07-30 DIAGNOSIS — M6281 Muscle weakness (generalized): Secondary | ICD-10-CM | POA: Diagnosis not present

## 2023-07-30 NOTE — Patient Instructions (Signed)
  Charlotte Harris , Thank you for taking time to come for your Medicare Wellness Visit. I appreciate your ongoing commitment to your health goals. Please review the following plan we discussed and let me know if I can assist you in the future.   These are the goals we discussed:  Goals      Patient Stated     I will continue to drink water      Patient Stated     Gain weight      Patient Stated     04/14/2022, keep on living        This is a list of the screening recommended for you and due dates:  Health Maintenance  Topic Date Due   Complete foot exam   07/21/2018   Eye exam for diabetics  03/19/2022   COVID-19 Vaccine (5 - 2024-25 season) 08/15/2023*   Zoster (Shingles) Vaccine (1 of 2) 10/28/2023*   Hemoglobin A1C  12/28/2023   Medicare Annual Wellness Visit  07/29/2024   DTaP/Tdap/Td vaccine (3 - Td or Tdap) 11/14/2024   Pneumonia Vaccine  Completed   Flu Shot  Completed   DEXA scan (bone density measurement)  Completed   HPV Vaccine  Aged Out   Colon Cancer Screening  Discontinued  *Topic was postponed. The date shown is not the original due date.   MMSE 28/30. Recently admitted to SNF at [facility] due to stroke with left sided weakness. Nonambulatory at this time. Does not want future covid vaccines or Shingrix. She may need podiatry consult in future.

## 2023-07-30 NOTE — Progress Notes (Signed)
Subjective:   Charlotte Harris is a 89 y.o. female who presents for Medicare Annual (Subsequent) preventive examination.  Visit Complete: In person  Patient Medicare AWV questionnaire was completed by the patient on 07/30/2023; I have confirmed that all information answered by patient is correct and no changes since this date.  Cardiac Risk Factors include: advanced age (>2men, >20 women);sedentary lifestyle;hypertension;dyslipidemia;diabetes mellitus     Objective:    Today's Vitals   07/30/23 1022 07/30/23 1048  BP: 118/61   Pulse: 93   Resp: 19   Temp: (!) 97.2 F (36.2 C)   SpO2: 96%   Weight: 97 lb 1.6 oz (44 kg)   Height: 5\' 5"  (1.651 m)   PainSc:  0-No pain   Body mass index is 16.16 kg/m.     07/30/2023   10:38 AM 07/26/2023   10:12 AM 07/19/2023   10:51 AM 07/08/2023    2:47 PM 04/14/2022    1:33 PM 03/25/2021    1:53 PM 03/15/2020   11:22 AM  Advanced Directives  Does Patient Have a Medical Advance Directive? Yes Yes Yes Unable to assess, patient is non-responsive or altered mental status Yes Yes Yes  Type of Advance Directive Healthcare Power of [place];Out of facility DNR (pink MOST or yellow form) Healthcare Power of [place];Out of facility DNR (pink MOST or yellow form) Out of facility DNR (pink MOST or yellow form);Healthcare Power of [employer] of [place];Living will Healthcare Power of [employer] of [place];Living will  Does patient want to make changes to medical advance directive? No - Patient declined No - Patient declined No - Patient declined    No - Patient declined  Copy of Healthcare Power of Attorney in Chart? Yes - validated most recent copy scanned in chart (See row information) Yes - validated most recent copy scanned in chart (See row information) No - copy requested  Yes - validated most recent copy scanned in chart (See row information) Yes - validated most recent copy scanned in chart (See row information)  Yes - validated most recent copy scanned in chart (See row information)    Current Medications (verified) Outpatient Encounter Medications as of 07/30/2023  Medication Sig   acetaminophen (TYLENOL) 500 MG tablet Take 500 mg by mouth every 6 (six) hours as needed for headache (pain).    albuterol (VENTOLIN HFA) 108 (90 Base) MCG/ACT inhaler Inhale 2 puffs into the lungs every 6 (six) hours as needed for wheezing or shortness of breath.   Amino Acids-Protein Hydrolys (PRO-STAT PO) Take 30 mLs by mouth daily.   budesonide (PULMICORT) 0.25 MG/2ML nebulizer solution USE (0.25 MG TOTAL) BY NEBULIZATION TWICE A DAY.   Calcium Carb-Cholecalciferol (CALCIUM 1000 + D PO) Take 2 tablets by mouth daily.   FERROUS SULFATE PO Take 1 tablet by mouth every morning.   hydrOXYzine (VISTARIL) 25 MG capsule Take 1 capsule (25 mg total) by mouth 2 (two) times daily as needed.   levothyroxine (SYNTHROID) 50 MCG tablet TAKE 1 TABLET BY MOUTH EVERY DAY BEFORE BREAKFAST   losartan (COZAAR) 100 MG tablet Take 1 tablet (100 mg total) by mouth daily.   metFORMIN (GLUCOPHAGE-XR) 500 MG 24 hr tablet Take 1 tablet (500 mg total) by mouth daily.   metoprolol succinate (TOPROL-XL) 25 MG 24 hr tablet Take 1 tablet (25 mg total) by mouth daily.   NIFEdipine (PROCARDIA XL/NIFEDICAL-XL) 90 MG 24 hr tablet Take 1 tablet (90 mg total) by mouth daily.   pravastatin (  PRAVACHOL) 40 MG tablet Place 1 tablet (40 mg total) into feeding tube daily at 6 PM.   senna-docusate (SENOKOT-S) 8.6-50 MG tablet Take 1 tablet by mouth as needed for mild constipation.   Tiotropium Bromide-Olodaterol 2.5-2.5 MCG/ACT AERS Inhale 2 puffs into the lungs every morning.   vitamin B-12 (CYANOCOBALAMIN) 100 MCG tablet Take 100 mcg by mouth daily.   zinc oxide 20 % ointment Apply 1 Application topically as needed for irritation.   [DISCONTINUED] senna-docusate (SENOKOT-S) 8.6-50 MG tablet Take 1 tablet by mouth at bedtime as needed for mild constipation.  (Patient taking differently: Take 1 tablet by mouth as needed for mild constipation.)   No facility-administered encounter medications on file as of 07/30/2023.    Allergies (verified) Crestor [rosuvastatin calcium]   History: Past Medical History:  Diagnosis Date   ADENOCARCINOMA, LUNG 07/03/2010   RUL   ASYMPTOMATIC POSTMENOPAUSAL STATUS 07/02/2009   Breast cancer (HCC) 1991   R mastectomy, no chemo or radiation   COLONIC POLYPS, HX OF 04/16/2007   COPD 05/17/2008   HEMATURIA, HX OF 04/16/2007   History of radiation therapy 07/30/09 to 08/09/09   RUL lung   HYPERLIPIDEMIA 04/16/2007   Hypertension    HYPERTENSION 04/16/2007   HYPOTHYROIDISM, POST-RADIATION 06/07/2008   Osteoarthritis    OSTEOPOROSIS 07/02/2009   Wrist fracture, left    Past Surgical History:  Procedure Laterality Date   BREAST SURGERY     lumpectomy   EYE SURGERY Bilateral    cataracts   LUNG BIOPSY  08/31/11   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   LUNG BIOPSY  05/22/2009   RUL lung=Adenocarcinoma   MASTECTOMY  1991   right   TONSILLECTOMY  1955   Family History  Problem Relation Age of Onset   Heart attack Mother 36   Heart disease Mother    Cancer Sister        "throat" cancer   Heart disease Father    Cancer Brother        blood   Heart disease Sister    Cancer Brother        blood   Heart disease Other        CAD   Hypertension Other    Colon cancer Neg Hx    Stomach cancer Neg Hx    Social History   Socioeconomic History   Marital status: Widowed    Spouse name: Not on file   Number of children: Not on file   Years of education: Not on file   Highest education level: 12th grade  Occupational History   Occupation: Retired (worked Public librarian)    Employer: RETIRED  Tobacco Use   Smoking status: Former    Current packs/day: 0.00    Average packs/day: 1 pack/day for 50.0 years (50.0 ttl pk-yrs)    Types: Cigarettes    Start date: 08/04/1959    Quit date: 08/03/2009     Years since quitting: 13.9   Smokeless tobacco: Never  Vaping Use   Vaping status: Never Used  Substance and Sexual Activity   Alcohol use: Yes    Alcohol/week: 2.0 standard drinks of alcohol    Types: 2 Glasses of wine per week   Drug use: No   Sexual activity: Not on file  Other Topics Concern   Not on file  Social History Narrative   Widowed 2005   Quit smoking 2010   No other exposures, no TB hx   Social Drivers of Health  Financial Resource Strain: Low Risk  (05/10/2022)   Overall Financial Resource Strain (CARDIA)    Difficulty of Paying Living Expenses: Not hard at all  Food Insecurity: No Food Insecurity (07/30/2023)   Hunger Vital Sign    Worried About Running Out of Food in the Last Year: Never true    Ran Out of Food in the Last Year: Never true  Transportation Needs: No Transportation Needs (07/30/2023)   PRAPARE - Administrator, Civil Service (Medical): No    Lack of Transportation (Non-Medical): No  Physical Activity: Unknown (05/10/2022)   Exercise Vital Sign    Days of Exercise per Week: 0 days    Minutes of Exercise per Session: Not on file  Recent Concern: Physical Activity - Inactive (05/10/2022)   Exercise Vital Sign    Days of Exercise per Week: 0 days    Minutes of Exercise per Session: 0 min  Stress: No Stress Concern Present (05/10/2022)   [employer] of Occupational Health - Occupational Stress Questionnaire    Feeling of Stress : Not at all  Social Connections: Unknown (07/30/2023)   Social Connection and Isolation Panel [NHANES]    Frequency of Communication with Friends and Family: Twice a week    Frequency of Social Gatherings with Friends and Family: Patient declined    Attends Religious Services: More than 4 times per year    Active Member of [employer] or Organizations: No    Attends Banker Meetings: Never    Marital Status: Widowed    Tobacco Counseling Counseling given: Not Answered   Clinical  Intake:  Pre-visit preparation completed: Yes  Pain : No/denies pain Pain Score: 0-No pain     BMI - recorded: 16.16 Nutritional Status: BMI <19  Underweight Nutritional Risks: None Diabetes: Yes CBG done?: No Did pt. bring in CBG monitor from home?: No  How often do you need to have someone help you when you read instructions, pamphlets, or other written materials from your doctor or pharmacy?: 2 - Rarely What is the last grade level you completed in school?: High school  Interpreter Needed?: No      Activities of Daily Living    07/30/2023   11:06 AM 07/10/2023    1:55 PM  In your present state of health, do you have any difficulty performing the following activities:  Hearing? 0 0  Vision? 0 0  Difficulty concentrating or making decisions? 0 0  Walking or climbing stairs? 1   Dressing or bathing? 1   Doing errands, shopping? 1   Preparing Food and eating ? Y   Using the Toilet? Y   In the past six months, have you accidently leaked urine? Y   Do you have problems with loss of bowel control? N   Managing your Medications? Y   Managing your Finances? Y   Housekeeping or managing your Housekeeping? Y     Patient Care Team: Karie Georges, MD as PCP - General (Family Medicine) Si Gaul, MD as Consulting Physician (Oncology) Dorothy Puffer, MD as Consulting Physician (Radiation Oncology) Meryl Dare, MD as Consulting Physician (Gastroenterology) Glenford Peers, OD as Referring Physician (Optometry)  Indicate any recent Medical Services you may have received from other than [facility] providers in the past year (date may be approximate).     Assessment:   This is a routine wellness examination for [place].  Hearing/Vision screen No results found.   Goals Addressed  This Visit's Progress    Patient Stated   On track    I will continue to drink water      Patient Stated   Not on track    Gain weight      Patient Stated   On  track    04/14/2022, keep on living       Depression Screen    07/30/2023   11:08 AM 07/19/2023    4:43 PM 05/28/2023    1:31 PM 04/14/2022    1:34 PM 03/25/2021    1:52 PM 03/15/2020   11:23 AM 12/07/2019    1:13 PM  PHQ 2/9 Scores  PHQ - 2 Score 2 2 3  0 0 0 0  PHQ- 9 Score 3 4 3    0     Fall Risk    07/30/2023   11:09 AM 07/26/2023   11:20 AM 07/19/2023    4:42 PM 05/28/2023    1:31 PM 04/14/2022    1:34 PM  Fall Risk   Falls in the past year? 1 1 1  0 0  Number falls in past yr: 0 0 0 0 0  Injury with Fall? 1 0 0 0 0  Risk for fall due to : History of fall(s);Impaired balance/gait;Impaired mobility History of fall(s);Impaired balance/gait;Impaired mobility History of fall(s);Impaired balance/gait;Impaired mobility Impaired mobility Medication side effect  Follow up Falls evaluation completed;Education provided;Falls prevention discussed Falls evaluation completed;Education provided;Falls prevention discussed Falls evaluation completed;Education provided;Falls prevention discussed Falls evaluation completed Falls prevention discussed;Education provided;Falls evaluation completed    MEDICARE RISK AT HOME: Medicare Risk at Home Any stairs in or around the home?: No If so, are there any without handrails?: No Home free of loose throw rugs in walkways, pet beds, electrical cords, etc?: Yes Adequate lighting in your home to reduce risk of falls?: Yes Life alert?: No Use of a cane, walker or w/c?: Yes Grab bars in the bathroom?: Yes Shower chair or bench in shower?: Yes Elevated toilet seat or a handicapped toilet?: Yes  TIMED UP AND GO:  Was the test performed?  No    Cognitive Function:    07/30/2023   10:52 AM  MMSE - Mini Mental State Exam  Orientation to time 5  Orientation to Place 5  Registration 3  Attention/ Calculation 5  Recall 3  Language- name 2 objects 2  Language- repeat 1  Language- follow 3 step command 3  Language- read & follow direction 1   Write a sentence 0  Copy design 0  Total score 28        04/14/2022    1:36 PM 03/25/2021    1:56 PM 03/15/2020   11:28 AM  6CIT Screen  What Year? 0 points 0 points 0 points  What month? 0 points 0 points 0 points  What time? 0 points 0 points 0 points  Count back from 20 0 points 0 points 0 points  Months in reverse 0 points 0 points 0 points  Repeat phrase 0 points 0 points 0 points  Total Score 0 points 0 points 0 points    Immunizations Immunization History  Administered Date(s) Administered   Fluad Quad(high Dose 65+) 05/26/2019, 05/14/2022   Fluad Trivalent(High Dose 65+) 07/15/2023   Influenza Whole 06/03/2009, 05/03/2010   Influenza-Unspecified 04/04/2011, 05/03/2013, 05/03/2014, 04/19/2017, 04/17/2018   PFIZER(Purple Top)SARS-COV-2 Vaccination 08/26/2019, 09/16/2019, 08/01/2020, 03/02/2021   Pneumococcal Conjugate-13 08/16/2014   Pneumococcal Polysaccharide-23 06/04/1997, 07/03/2010   Td 01/01/1998   Tdap 11/15/2014   Zoster,  Live 02/13/2014    TDAP status: Up to date  Flu Vaccine status: Up to date  Pneumococcal vaccine status: Up to date  Covid-19 vaccine status: Completed vaccines  Qualifies for Shingles Vaccine? Yes   Zostavax completed Yes   Shingrix Completed?: No.    Education has been provided regarding the importance of this vaccine. Patient has been advised to call insurance company to determine out of pocket expense if they have not yet received this vaccine. Advised may also receive vaccine at local pharmacy or Health Dept. Verbalized acceptance and understanding.  Screening Tests Health Maintenance  Topic Date Due   FOOT EXAM  07/21/2018   OPHTHALMOLOGY EXAM  03/19/2022   COVID-19 Vaccine (5 - 2024-25 season) 08/15/2023 (Originally 04/04/2023)   Zoster Vaccines- Shingrix (1 of 2) 10/28/2023 (Originally 02/11/1951)   HEMOGLOBIN A1C  12/28/2023   Medicare Annual Wellness (AWV)  07/29/2024   DTaP/Tdap/Td (3 - Td or Tdap) 11/14/2024   Pneumonia  Vaccine 66+ Years old  Completed   INFLUENZA VACCINE  Completed   DEXA SCAN  Completed   HPV VACCINES  Aged Out   Colonoscopy  Discontinued    Health Maintenance  Health Maintenance Due  Topic Date Due   FOOT EXAM  07/21/2018   OPHTHALMOLOGY EXAM  03/19/2022    Colorectal cancer screening: No longer required.   Mammogram status: No longer required due to advanced age.  Bone Density status: Completed 2019. Results reflect: Bone density results: OSTEOPOROSIS. Repeat every 2> refused years.  Lung Cancer Screening: (Low Dose CT Chest recommended if Age 105-80 years, 20 pack-year currently smoking OR have quit w/in 15years.) does not qualify.   Lung Cancer Screening Referral: No due to advanced age  Additional Screening:  Hepatitis C Screening: does not qualify; Completed   Vision Screening: Recommended annual ophthalmology exams for early detection of glaucoma and other disorders of the eye. Is the patient up to date with their annual eye exam?  No  Who is the provider or what is the name of the office in which the patient attends annual eye exams? Dr. Harriette Bouillon not seen for a year If pt is not established with a provider, would they like to be referred to a provider to establish care? No .   Dental Screening: Recommended annual dental exams for proper oral hygiene  Diabetic Foot Exam: Diabetic Foot Exam: Completed 07/30/2023  Community Resource Referral / Chronic Care Management: CRR required this visit?  No   CCM required this visit?  No     Plan:     I have personally reviewed and noted the following in the patient's chart:   Medical and social history Use of alcohol, tobacco or illicit drugs  Current medications and supplements including opioid prescriptions. Patient is not currently taking opioid prescriptions. Functional ability and status Nutritional status Physical activity Advanced directives List of other physicians Hospitalizations, surgeries, and ER  visits in previous 12 months Vitals Screenings to include cognitive, depression, and falls Referrals and appointments  In addition, I have reviewed and discussed with patient certain preventive protocols, quality metrics, and best practice recommendations. A written personalized care plan for preventive services as well as general preventive health recommendations were provided to patient.     Octavia Heir, NP   07/30/2023   After Visit Summary: (MyChart) Due to this being a telephonic visit, the after visit summary with patients personalized plan was offered to patient via MyChart   Nurse Notes: MMSE 28/30. Recently admitted to SNF  at [facility] due to stroke with left sided weakness. Nonambulatory at this time. Does not want future covid vaccines or Shingrix. She may need podiatry consult in future.

## 2023-07-31 DIAGNOSIS — R279 Unspecified lack of coordination: Secondary | ICD-10-CM | POA: Diagnosis not present

## 2023-07-31 DIAGNOSIS — R29898 Other symptoms and signs involving the musculoskeletal system: Secondary | ICD-10-CM | POA: Diagnosis not present

## 2023-07-31 DIAGNOSIS — M6281 Muscle weakness (generalized): Secondary | ICD-10-CM | POA: Diagnosis not present

## 2023-07-31 DIAGNOSIS — I69854 Hemiplegia and hemiparesis following other cerebrovascular disease affecting left non-dominant side: Secondary | ICD-10-CM | POA: Diagnosis not present

## 2023-08-02 ENCOUNTER — Telehealth: Payer: Self-pay | Admitting: Adult Health

## 2023-08-02 DIAGNOSIS — M6281 Muscle weakness (generalized): Secondary | ICD-10-CM | POA: Diagnosis not present

## 2023-08-02 DIAGNOSIS — E43 Unspecified severe protein-calorie malnutrition: Secondary | ICD-10-CM | POA: Diagnosis not present

## 2023-08-02 DIAGNOSIS — I619 Nontraumatic intracerebral hemorrhage, unspecified: Secondary | ICD-10-CM | POA: Diagnosis not present

## 2023-08-02 DIAGNOSIS — I69854 Hemiplegia and hemiparesis following other cerebrovascular disease affecting left non-dominant side: Secondary | ICD-10-CM | POA: Diagnosis not present

## 2023-08-02 DIAGNOSIS — R1319 Other dysphagia: Secondary | ICD-10-CM | POA: Diagnosis not present

## 2023-08-02 DIAGNOSIS — R279 Unspecified lack of coordination: Secondary | ICD-10-CM | POA: Diagnosis not present

## 2023-08-02 DIAGNOSIS — R29898 Other symptoms and signs involving the musculoskeletal system: Secondary | ICD-10-CM | POA: Diagnosis not present

## 2023-08-02 DIAGNOSIS — R262 Difficulty in walking, not elsewhere classified: Secondary | ICD-10-CM | POA: Diagnosis not present

## 2023-08-02 DIAGNOSIS — J449 Chronic obstructive pulmonary disease, unspecified: Secondary | ICD-10-CM | POA: Diagnosis not present

## 2023-08-02 NOTE — Telephone Encounter (Signed)
Called son to offer sooner appt. He stated that he would call the facility to see if the sooner appt would be ok to take. Offered 09/19/22 @1 :15pm with Shanda Bumps

## 2023-08-02 NOTE — Telephone Encounter (Signed)
Pt's son states pt is hard to get up in the morning and is requesting a afternoon appt. She is currently scheduled for 08/26/23 at 9:15am. Requesting call back to possible get her in a afternoon slot.

## 2023-08-03 DIAGNOSIS — I619 Nontraumatic intracerebral hemorrhage, unspecified: Secondary | ICD-10-CM | POA: Diagnosis not present

## 2023-08-03 DIAGNOSIS — M6281 Muscle weakness (generalized): Secondary | ICD-10-CM | POA: Diagnosis not present

## 2023-08-03 DIAGNOSIS — J449 Chronic obstructive pulmonary disease, unspecified: Secondary | ICD-10-CM | POA: Diagnosis not present

## 2023-08-03 DIAGNOSIS — R1319 Other dysphagia: Secondary | ICD-10-CM | POA: Diagnosis not present

## 2023-08-03 DIAGNOSIS — E43 Unspecified severe protein-calorie malnutrition: Secondary | ICD-10-CM | POA: Diagnosis not present

## 2023-08-04 DIAGNOSIS — R262 Difficulty in walking, not elsewhere classified: Secondary | ICD-10-CM | POA: Diagnosis not present

## 2023-08-04 DIAGNOSIS — I69854 Hemiplegia and hemiparesis following other cerebrovascular disease affecting left non-dominant side: Secondary | ICD-10-CM | POA: Diagnosis not present

## 2023-08-04 DIAGNOSIS — M6281 Muscle weakness (generalized): Secondary | ICD-10-CM | POA: Diagnosis not present

## 2023-08-04 DIAGNOSIS — R29898 Other symptoms and signs involving the musculoskeletal system: Secondary | ICD-10-CM | POA: Diagnosis not present

## 2023-08-04 DIAGNOSIS — R279 Unspecified lack of coordination: Secondary | ICD-10-CM | POA: Diagnosis not present

## 2023-08-05 NOTE — Telephone Encounter (Signed)
 Pts son called to r/s appt to an afternoon appt. R/s

## 2023-08-06 DIAGNOSIS — I69854 Hemiplegia and hemiparesis following other cerebrovascular disease affecting left non-dominant side: Secondary | ICD-10-CM | POA: Diagnosis not present

## 2023-08-06 DIAGNOSIS — R1319 Other dysphagia: Secondary | ICD-10-CM | POA: Diagnosis not present

## 2023-08-06 DIAGNOSIS — J449 Chronic obstructive pulmonary disease, unspecified: Secondary | ICD-10-CM | POA: Diagnosis not present

## 2023-08-06 DIAGNOSIS — M6281 Muscle weakness (generalized): Secondary | ICD-10-CM | POA: Diagnosis not present

## 2023-08-06 DIAGNOSIS — R262 Difficulty in walking, not elsewhere classified: Secondary | ICD-10-CM | POA: Diagnosis not present

## 2023-08-06 DIAGNOSIS — I619 Nontraumatic intracerebral hemorrhage, unspecified: Secondary | ICD-10-CM | POA: Diagnosis not present

## 2023-08-06 DIAGNOSIS — R29898 Other symptoms and signs involving the musculoskeletal system: Secondary | ICD-10-CM | POA: Diagnosis not present

## 2023-08-06 DIAGNOSIS — E43 Unspecified severe protein-calorie malnutrition: Secondary | ICD-10-CM | POA: Diagnosis not present

## 2023-08-09 DIAGNOSIS — M6281 Muscle weakness (generalized): Secondary | ICD-10-CM | POA: Diagnosis not present

## 2023-08-09 DIAGNOSIS — R279 Unspecified lack of coordination: Secondary | ICD-10-CM | POA: Diagnosis not present

## 2023-08-09 DIAGNOSIS — E43 Unspecified severe protein-calorie malnutrition: Secondary | ICD-10-CM | POA: Diagnosis not present

## 2023-08-09 DIAGNOSIS — R29898 Other symptoms and signs involving the musculoskeletal system: Secondary | ICD-10-CM | POA: Diagnosis not present

## 2023-08-09 DIAGNOSIS — R1319 Other dysphagia: Secondary | ICD-10-CM | POA: Diagnosis not present

## 2023-08-09 DIAGNOSIS — R262 Difficulty in walking, not elsewhere classified: Secondary | ICD-10-CM | POA: Diagnosis not present

## 2023-08-09 DIAGNOSIS — I69854 Hemiplegia and hemiparesis following other cerebrovascular disease affecting left non-dominant side: Secondary | ICD-10-CM | POA: Diagnosis not present

## 2023-08-09 DIAGNOSIS — J449 Chronic obstructive pulmonary disease, unspecified: Secondary | ICD-10-CM | POA: Diagnosis not present

## 2023-08-09 DIAGNOSIS — I619 Nontraumatic intracerebral hemorrhage, unspecified: Secondary | ICD-10-CM | POA: Diagnosis not present

## 2023-08-10 DIAGNOSIS — R1319 Other dysphagia: Secondary | ICD-10-CM | POA: Diagnosis not present

## 2023-08-10 DIAGNOSIS — R29898 Other symptoms and signs involving the musculoskeletal system: Secondary | ICD-10-CM | POA: Diagnosis not present

## 2023-08-10 DIAGNOSIS — E43 Unspecified severe protein-calorie malnutrition: Secondary | ICD-10-CM | POA: Diagnosis not present

## 2023-08-10 DIAGNOSIS — I619 Nontraumatic intracerebral hemorrhage, unspecified: Secondary | ICD-10-CM | POA: Diagnosis not present

## 2023-08-10 DIAGNOSIS — M6281 Muscle weakness (generalized): Secondary | ICD-10-CM | POA: Diagnosis not present

## 2023-08-10 DIAGNOSIS — R279 Unspecified lack of coordination: Secondary | ICD-10-CM | POA: Diagnosis not present

## 2023-08-10 DIAGNOSIS — J449 Chronic obstructive pulmonary disease, unspecified: Secondary | ICD-10-CM | POA: Diagnosis not present

## 2023-08-10 DIAGNOSIS — I69854 Hemiplegia and hemiparesis following other cerebrovascular disease affecting left non-dominant side: Secondary | ICD-10-CM | POA: Diagnosis not present

## 2023-08-11 DIAGNOSIS — R29898 Other symptoms and signs involving the musculoskeletal system: Secondary | ICD-10-CM | POA: Diagnosis not present

## 2023-08-11 DIAGNOSIS — R262 Difficulty in walking, not elsewhere classified: Secondary | ICD-10-CM | POA: Diagnosis not present

## 2023-08-11 DIAGNOSIS — M6281 Muscle weakness (generalized): Secondary | ICD-10-CM | POA: Diagnosis not present

## 2023-08-11 DIAGNOSIS — I69854 Hemiplegia and hemiparesis following other cerebrovascular disease affecting left non-dominant side: Secondary | ICD-10-CM | POA: Diagnosis not present

## 2023-08-12 DIAGNOSIS — I69854 Hemiplegia and hemiparesis following other cerebrovascular disease affecting left non-dominant side: Secondary | ICD-10-CM | POA: Diagnosis not present

## 2023-08-12 DIAGNOSIS — I619 Nontraumatic intracerebral hemorrhage, unspecified: Secondary | ICD-10-CM | POA: Diagnosis not present

## 2023-08-12 DIAGNOSIS — R29898 Other symptoms and signs involving the musculoskeletal system: Secondary | ICD-10-CM | POA: Diagnosis not present

## 2023-08-12 DIAGNOSIS — R1319 Other dysphagia: Secondary | ICD-10-CM | POA: Diagnosis not present

## 2023-08-12 DIAGNOSIS — M6281 Muscle weakness (generalized): Secondary | ICD-10-CM | POA: Diagnosis not present

## 2023-08-12 DIAGNOSIS — J449 Chronic obstructive pulmonary disease, unspecified: Secondary | ICD-10-CM | POA: Diagnosis not present

## 2023-08-12 DIAGNOSIS — E43 Unspecified severe protein-calorie malnutrition: Secondary | ICD-10-CM | POA: Diagnosis not present

## 2023-08-12 DIAGNOSIS — R279 Unspecified lack of coordination: Secondary | ICD-10-CM | POA: Diagnosis not present

## 2023-08-13 DIAGNOSIS — I69854 Hemiplegia and hemiparesis following other cerebrovascular disease affecting left non-dominant side: Secondary | ICD-10-CM | POA: Diagnosis not present

## 2023-08-13 DIAGNOSIS — M6281 Muscle weakness (generalized): Secondary | ICD-10-CM | POA: Diagnosis not present

## 2023-08-13 DIAGNOSIS — R262 Difficulty in walking, not elsewhere classified: Secondary | ICD-10-CM | POA: Diagnosis not present

## 2023-08-13 DIAGNOSIS — R29898 Other symptoms and signs involving the musculoskeletal system: Secondary | ICD-10-CM | POA: Diagnosis not present

## 2023-08-16 DIAGNOSIS — R29898 Other symptoms and signs involving the musculoskeletal system: Secondary | ICD-10-CM | POA: Diagnosis not present

## 2023-08-16 DIAGNOSIS — I619 Nontraumatic intracerebral hemorrhage, unspecified: Secondary | ICD-10-CM | POA: Diagnosis not present

## 2023-08-16 DIAGNOSIS — J449 Chronic obstructive pulmonary disease, unspecified: Secondary | ICD-10-CM | POA: Diagnosis not present

## 2023-08-16 DIAGNOSIS — I69854 Hemiplegia and hemiparesis following other cerebrovascular disease affecting left non-dominant side: Secondary | ICD-10-CM | POA: Diagnosis not present

## 2023-08-16 DIAGNOSIS — R262 Difficulty in walking, not elsewhere classified: Secondary | ICD-10-CM | POA: Diagnosis not present

## 2023-08-16 DIAGNOSIS — R279 Unspecified lack of coordination: Secondary | ICD-10-CM | POA: Diagnosis not present

## 2023-08-16 DIAGNOSIS — M6281 Muscle weakness (generalized): Secondary | ICD-10-CM | POA: Diagnosis not present

## 2023-08-16 DIAGNOSIS — R1319 Other dysphagia: Secondary | ICD-10-CM | POA: Diagnosis not present

## 2023-08-16 DIAGNOSIS — E43 Unspecified severe protein-calorie malnutrition: Secondary | ICD-10-CM | POA: Diagnosis not present

## 2023-08-17 DIAGNOSIS — R1319 Other dysphagia: Secondary | ICD-10-CM | POA: Diagnosis not present

## 2023-08-17 DIAGNOSIS — I619 Nontraumatic intracerebral hemorrhage, unspecified: Secondary | ICD-10-CM | POA: Diagnosis not present

## 2023-08-17 DIAGNOSIS — I69854 Hemiplegia and hemiparesis following other cerebrovascular disease affecting left non-dominant side: Secondary | ICD-10-CM | POA: Diagnosis not present

## 2023-08-17 DIAGNOSIS — M6281 Muscle weakness (generalized): Secondary | ICD-10-CM | POA: Diagnosis not present

## 2023-08-17 DIAGNOSIS — E43 Unspecified severe protein-calorie malnutrition: Secondary | ICD-10-CM | POA: Diagnosis not present

## 2023-08-17 DIAGNOSIS — J449 Chronic obstructive pulmonary disease, unspecified: Secondary | ICD-10-CM | POA: Diagnosis not present

## 2023-08-17 DIAGNOSIS — R279 Unspecified lack of coordination: Secondary | ICD-10-CM | POA: Diagnosis not present

## 2023-08-17 DIAGNOSIS — R29898 Other symptoms and signs involving the musculoskeletal system: Secondary | ICD-10-CM | POA: Diagnosis not present

## 2023-08-18 DIAGNOSIS — R29898 Other symptoms and signs involving the musculoskeletal system: Secondary | ICD-10-CM | POA: Diagnosis not present

## 2023-08-18 DIAGNOSIS — I69854 Hemiplegia and hemiparesis following other cerebrovascular disease affecting left non-dominant side: Secondary | ICD-10-CM | POA: Diagnosis not present

## 2023-08-18 DIAGNOSIS — M6281 Muscle weakness (generalized): Secondary | ICD-10-CM | POA: Diagnosis not present

## 2023-08-18 DIAGNOSIS — R262 Difficulty in walking, not elsewhere classified: Secondary | ICD-10-CM | POA: Diagnosis not present

## 2023-08-19 DIAGNOSIS — I69854 Hemiplegia and hemiparesis following other cerebrovascular disease affecting left non-dominant side: Secondary | ICD-10-CM | POA: Diagnosis not present

## 2023-08-19 DIAGNOSIS — R29898 Other symptoms and signs involving the musculoskeletal system: Secondary | ICD-10-CM | POA: Diagnosis not present

## 2023-08-19 DIAGNOSIS — M6281 Muscle weakness (generalized): Secondary | ICD-10-CM | POA: Diagnosis not present

## 2023-08-19 DIAGNOSIS — R279 Unspecified lack of coordination: Secondary | ICD-10-CM | POA: Diagnosis not present

## 2023-08-20 DIAGNOSIS — R262 Difficulty in walking, not elsewhere classified: Secondary | ICD-10-CM | POA: Diagnosis not present

## 2023-08-20 DIAGNOSIS — I619 Nontraumatic intracerebral hemorrhage, unspecified: Secondary | ICD-10-CM | POA: Diagnosis not present

## 2023-08-20 DIAGNOSIS — R1319 Other dysphagia: Secondary | ICD-10-CM | POA: Diagnosis not present

## 2023-08-20 DIAGNOSIS — M6281 Muscle weakness (generalized): Secondary | ICD-10-CM | POA: Diagnosis not present

## 2023-08-20 DIAGNOSIS — E43 Unspecified severe protein-calorie malnutrition: Secondary | ICD-10-CM | POA: Diagnosis not present

## 2023-08-20 DIAGNOSIS — R29898 Other symptoms and signs involving the musculoskeletal system: Secondary | ICD-10-CM | POA: Diagnosis not present

## 2023-08-20 DIAGNOSIS — J449 Chronic obstructive pulmonary disease, unspecified: Secondary | ICD-10-CM | POA: Diagnosis not present

## 2023-08-20 DIAGNOSIS — I69854 Hemiplegia and hemiparesis following other cerebrovascular disease affecting left non-dominant side: Secondary | ICD-10-CM | POA: Diagnosis not present

## 2023-08-23 ENCOUNTER — Non-Acute Institutional Stay: Payer: Self-pay | Admitting: Orthopedic Surgery

## 2023-08-23 ENCOUNTER — Encounter: Payer: Self-pay | Admitting: Orthopedic Surgery

## 2023-08-23 ENCOUNTER — Non-Acute Institutional Stay (SKILLED_NURSING_FACILITY): Payer: Medicare Other | Admitting: Orthopedic Surgery

## 2023-08-23 DIAGNOSIS — L89152 Pressure ulcer of sacral region, stage 2: Secondary | ICD-10-CM

## 2023-08-23 DIAGNOSIS — I610 Nontraumatic intracerebral hemorrhage in hemisphere, subcortical: Secondary | ICD-10-CM

## 2023-08-23 DIAGNOSIS — J449 Chronic obstructive pulmonary disease, unspecified: Secondary | ICD-10-CM | POA: Diagnosis not present

## 2023-08-23 DIAGNOSIS — I10 Essential (primary) hypertension: Secondary | ICD-10-CM | POA: Diagnosis not present

## 2023-08-23 DIAGNOSIS — E890 Postprocedural hypothyroidism: Secondary | ICD-10-CM | POA: Diagnosis not present

## 2023-08-23 DIAGNOSIS — Z853 Personal history of malignant neoplasm of breast: Secondary | ICD-10-CM | POA: Diagnosis not present

## 2023-08-23 DIAGNOSIS — Z85118 Personal history of other malignant neoplasm of bronchus and lung: Secondary | ICD-10-CM | POA: Diagnosis not present

## 2023-08-23 DIAGNOSIS — F419 Anxiety disorder, unspecified: Secondary | ICD-10-CM

## 2023-08-23 DIAGNOSIS — R279 Unspecified lack of coordination: Secondary | ICD-10-CM | POA: Diagnosis not present

## 2023-08-23 DIAGNOSIS — R531 Weakness: Secondary | ICD-10-CM | POA: Diagnosis not present

## 2023-08-23 DIAGNOSIS — R262 Difficulty in walking, not elsewhere classified: Secondary | ICD-10-CM | POA: Diagnosis not present

## 2023-08-23 DIAGNOSIS — M6281 Muscle weakness (generalized): Secondary | ICD-10-CM | POA: Diagnosis not present

## 2023-08-23 DIAGNOSIS — E1149 Type 2 diabetes mellitus with other diabetic neurological complication: Secondary | ICD-10-CM | POA: Diagnosis not present

## 2023-08-23 DIAGNOSIS — D509 Iron deficiency anemia, unspecified: Secondary | ICD-10-CM

## 2023-08-23 DIAGNOSIS — R29898 Other symptoms and signs involving the musculoskeletal system: Secondary | ICD-10-CM | POA: Diagnosis not present

## 2023-08-23 DIAGNOSIS — R1319 Other dysphagia: Secondary | ICD-10-CM

## 2023-08-23 DIAGNOSIS — L89319 Pressure ulcer of right buttock, unspecified stage: Secondary | ICD-10-CM

## 2023-08-23 DIAGNOSIS — E43 Unspecified severe protein-calorie malnutrition: Secondary | ICD-10-CM | POA: Diagnosis not present

## 2023-08-23 DIAGNOSIS — I619 Nontraumatic intracerebral hemorrhage, unspecified: Secondary | ICD-10-CM | POA: Diagnosis not present

## 2023-08-23 DIAGNOSIS — R636 Underweight: Secondary | ICD-10-CM

## 2023-08-23 DIAGNOSIS — I69854 Hemiplegia and hemiparesis following other cerebrovascular disease affecting left non-dominant side: Secondary | ICD-10-CM | POA: Diagnosis not present

## 2023-08-23 NOTE — Progress Notes (Unsigned)
Location:   [facility]  Nursing Home Room Number: 16-A Place of Service:  SNF [phone]) Provider:  Hazle Nordmann, NP  PCP: Mahlon Gammon, MD  Patient Care Team: Mahlon Gammon, MD as PCP - General (Internal Medicine) Si Gaul, MD as Consulting Physician (Oncology) Dorothy Puffer, MD as Consulting Physician (Radiation Oncology) Meryl Dare, MD (Inactive) as Consulting Physician (Gastroenterology) Glenford Peers, OD as Referring Physician (Optometry)  Extended Emergency Contact Information Primary Emergency Contact: Bufkin,Celie Mobile Phone: [phone] Relation: Daughter Secondary Emergency Contact: Fonner,Richard Address: [address]          [place], [place] [address] [place] Phone: [phone] Relation: Son  Code Status:  DNR Goals of care: Advanced Directive information    08/23/2023   10:37 AM  Advanced Directives  Does Patient Have a Medical Advance Directive? Yes  Type of Estate agent of [place];Out of facility DNR (pink MOST or yellow form)  Does patient want to make changes to medical advance directive? No - Patient declined  Copy of Healthcare Power of Attorney in Chart? Yes - validated most recent copy scanned in chart (See row information)     Chief Complaint  Patient presents with   Medical Management of Chronic Issues    Routine Visit.    Immunizations    Discuss the need for [employer].    Health Maintenance    Discuss the need for Eye exam.     HPI:  Pt is a 89 y.o. female seen today for medical management of chronic diseases.    She currently resides on the skilled nursing unit at [facility]. PMH: HTN,HLD, lung cancer s/p radiation RUL (2011), breast cancer s/p right mastectomy> no chemo/radiation (1991), COPD, hypothyroidism post radiation, OA, osteoporosis, and recent unstable gait.   Anxiety- 12/23 hydroxyzine prn started, she denies panic attacks, she did have one  day where she asked for both doses, SSRI discussed> refused, remains on hydroxyzine prn Left sided weakness/hemorrhage of right hemisphere - hospitalized 12/05-12/13, CT head/MRI brain confirmed 2.4 x 1.7 cm right thalamic hemorrhage, goal SBP 130-150, left sided weakness improving> able to move left hand and grip, working with PT/OT, remains on pravastatin HTN- BUN/creat 20/0.59 07/22/2023, remains on losartan, Nifedipine and metoprolol T2DM- A1c 5.6 06/30/2023, remains on metformin Anemia- hgb 8.5 07/22/2023, remains on ferrous sulfate Esophageal dysphagia- aspiration PNA last hospitalization, followed by ST, remains on regular diet COPD stage 4- continuous oxygen, remains on Pulmicort, stiolto and albuterol prn Hypothyroidism- TSH 1.90 06/30/2023, remains on levothyroxine Stage II PU- located on sacrum, remains on air mattress/Prostat/high protein Boost, foam dressing changes daily H/o lung cancer- radiation to RUL 2011 H/o breast cancer- right mastectomy 1991    Past Medical History:  Diagnosis Date   ADENOCARCINOMA, LUNG 07/03/2010   RUL   ASYMPTOMATIC POSTMENOPAUSAL STATUS 07/02/2009   Breast cancer (HCC) 1991   R mastectomy, no chemo or radiation   COLONIC POLYPS, HX OF 04/16/2007   COPD 05/17/2008   HEMATURIA, HX OF 04/16/2007   History of radiation therapy 07/30/09 to 08/09/09   RUL lung   HYPERLIPIDEMIA 04/16/2007   Hypertension    HYPERTENSION 04/16/2007   HYPOTHYROIDISM, POST-RADIATION 06/07/2008   Osteoarthritis    OSTEOPOROSIS 07/02/2009   Wrist fracture, left    Past Surgical History:  Procedure Laterality Date   BREAST SURGERY     lumpectomy   EYE SURGERY Bilateral    cataracts   LUNG BIOPSY  08/31/11  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   LUNG BIOPSY  05/22/2009   RUL lung=Adenocarcinoma   MASTECTOMY  1991   right   TONSILLECTOMY  1955    Allergies  Allergen Reactions   Crestor [Rosuvastatin Calcium]     cramping    Allergies as of 08/23/2023        Reactions   Crestor [rosuvastatin Calcium]    cramping        Medication List        Accurate as of August 23, 2023 10:37 AM. If you have any questions, ask your nurse or doctor.          acetaminophen 500 MG tablet Commonly known as: TYLENOL Take 500 mg by mouth every 6 (six) hours as needed for headache (pain).   albuterol 108 (90 Base) MCG/ACT inhaler Commonly known as: VENTOLIN HFA Inhale 2 puffs into the lungs every 6 (six) hours as needed for wheezing or shortness of breath.   budesonide 0.25 MG/2ML nebulizer solution Commonly known as: PULMICORT USE (0.25 MG TOTAL) BY NEBULIZATION TWICE A DAY.   CALCIUM 1000 + D PO Take 2 tablets by mouth daily.   FERROUS SULFATE PO Take 1 tablet by mouth every morning.   hydrOXYzine 25 MG capsule Commonly known as: VISTARIL Take 1 capsule (25 mg total) by mouth 2 (two) times daily as needed.   levothyroxine 50 MCG tablet Commonly known as: SYNTHROID TAKE 1 TABLET BY MOUTH EVERY DAY BEFORE BREAKFAST   losartan 100 MG tablet Commonly known as: COZAAR Take 1 tablet (100 mg total) by mouth daily.   metFORMIN 500 MG 24 hr tablet Commonly known as: GLUCOPHAGE-XR Take 1 tablet (500 mg total) by mouth daily.   metoprolol succinate 25 MG 24 hr tablet Commonly known as: TOPROL-XL Take 1 tablet (25 mg total) by mouth daily.   NIFEdipine 90 MG 24 hr tablet Commonly known as: PROCARDIA XL/NIFEDICAL-XL Take 1 tablet (90 mg total) by mouth daily.   pravastatin 40 MG tablet Commonly known as: PRAVACHOL Place 1 tablet (40 mg total) into feeding tube daily at 6 PM.   PreserVision AREDS 2+Multi Vit Caps Take 1 capsule by mouth every morning.   PRO-STAT PO Take 30 mLs by mouth daily.   senna-docusate 8.6-50 MG tablet Commonly known as: Senokot-S Take 1 tablet by mouth as needed for mild constipation.   Tiotropium Bromide-Olodaterol 2.5-2.5 MCG/ACT Aers Inhale 2 puffs into the lungs every morning.   vitamin B-12 100  MCG tablet Commonly known as: CYANOCOBALAMIN Take 100 mcg by mouth daily.   zinc oxide 20 % ointment Apply 1 Application topically as needed for irritation.        Review of Systems  Constitutional:  Negative for activity change, appetite change, fatigue and fever.  HENT:  Positive for trouble swallowing. Negative for sore throat.   Eyes:  Negative for visual disturbance.  Respiratory:  Positive for cough, shortness of breath and wheezing.   Cardiovascular:  Negative for chest pain and leg swelling.  Gastrointestinal:  Positive for constipation. Negative for abdominal distention and abdominal pain.  Genitourinary:  Negative for dysuria, frequency, hematuria and vaginal bleeding.  Musculoskeletal:  Positive for gait problem.  Skin:  Positive for wound.  Neurological:  Positive for weakness. Negative for dizziness and headaches.  Psychiatric/Behavioral:  Positive for sleep disturbance. Negative for confusion and dysphoric mood. The patient is nervous/anxious.     Immunization History  Administered Date(s) Administered   Fluad Quad(high Dose 65+) 05/26/2019, 05/14/2022  Fluad Trivalent(High Dose 65+) 07/15/2023   Influenza Whole 06/03/2009, 05/03/2010   Influenza-Unspecified 04/04/2011, 05/03/2013, 05/03/2014, 04/19/2017, 04/17/2018   PFIZER(Purple Top)SARS-COV-2 Vaccination 08/26/2019, 09/16/2019, 08/01/2020, 03/02/2021   Pneumococcal Conjugate-13 08/16/2014   Pneumococcal Polysaccharide-23 06/04/1997, 07/03/2010   Td 01/01/1998   Tdap 11/15/2014   Zoster, Live 02/13/2014   Pertinent  Health Maintenance Due  Topic Date Due   OPHTHALMOLOGY EXAM  03/19/2022   HEMOGLOBIN A1C  12/28/2023   FOOT EXAM  07/29/2024   INFLUENZA VACCINE  Completed   DEXA SCAN  Completed   Colonoscopy  Discontinued      04/14/2022    1:34 PM 05/28/2023    1:31 PM 07/19/2023    4:42 PM 07/26/2023   11:20 AM 07/30/2023   11:09 AM  Fall Risk  Falls in the past year? 0 0 1 1 1   Was there an  injury with Fall? 0 0 0 0 1  Fall Risk Category Calculator 0 0 1 1 2   Fall Risk Category (Retired) Low      (RETIRED) Patient Fall Risk Level Low fall risk      Patient at Risk for Falls Due to Medication side effect Impaired mobility History of fall(s);Impaired balance/gait;Impaired mobility History of fall(s);Impaired balance/gait;Impaired mobility History of fall(s);Impaired balance/gait;Impaired mobility  Fall risk Follow up Falls prevention discussed;Education provided;Falls evaluation completed Falls evaluation completed Falls evaluation completed;Education provided;Falls prevention discussed Falls evaluation completed;Education provided;Falls prevention discussed Falls evaluation completed;Education provided;Falls prevention discussed   Functional Status Survey:    Vitals:   08/23/23 1032  BP: 110/66  Pulse: 83  Resp: 20  Temp: (!) 96.8 F (36 C)  SpO2: 97%  Weight: 98 lb 8 oz (44.7 kg)  Height: 5\' 5"  (1.651 m)   Body mass index is 16.39 kg/m. Physical Exam Vitals reviewed.  Constitutional:      General: She is not in acute distress. HENT:     Head: Normocephalic.  Eyes:     General:        Right eye: No discharge.        Left eye: No discharge.  Cardiovascular:     Rate and Rhythm: Normal rate and regular rhythm.     Pulses: Normal pulses.     Heart sounds: Normal heart sounds.  Pulmonary:     Effort: Pulmonary effort is normal. No respiratory distress.     Breath sounds: Wheezing present. No rales.     Comments: Expiratory 3 liters Abdominal:     General: Bowel sounds are normal.     Palpations: Abdomen is soft.  Musculoskeletal:     Cervical back: Neck supple.     Right lower leg: No edema.     Left lower leg: No edema.  Skin:    General: Skin is warm.     Capillary Refill: Capillary refill takes less than 2 seconds.     Comments: Stage II pressure ulcer to sacrum, CDI, wound bed with granulation tissue. Skin tear to LFA and right shin CDI, no infection.    Neurological:     General: No focal deficit present.     Mental Status: She is alert and oriented to person, place, and time.     Motor: Weakness present.     Gait: Gait abnormal.     Comments: Left sided weakness, left hand grip 3/5, able to raise LFA  Psychiatric:        Mood and Affect: Mood normal.     Labs reviewed: Recent Labs  07/10/23 0507 07/10/23 1705 07/11/23 0447 07/12/23 0421 07/13/23 0447 07/14/23 0539 07/22/23 0000  NA 140  --  141 143 141 139 139  K 3.5  --  3.8 3.8 4.0 3.8 4.4  CL 92*  --  94* 96* 96* 98 92*  CO2 37*  --  37* 39* 32 36* 41*  GLUCOSE 153*  --  180* 119* 171* 138*  --   BUN 27*  --  31* 34* 35* 24* 20  CREATININE 0.64  --  0.72 0.64 0.64 0.84 0.6  CALCIUM 10.0  --  9.3 9.2 9.3 9.2 10.0  MG 2.1 2.2 2.2  --   --   --   --   PHOS 2.7 3.2 2.5  --   --   --   --    Recent Labs    06/30/23 1107 07/08/23 1518  AST 18 41  ALT 8 16  ALKPHOS 47 75  BILITOT 0.5 0.7  PROT 7.0 6.9  ALBUMIN 4.5 3.5   Recent Labs    06/30/23 1107 07/08/23 1518 07/08/23 1538 07/14/23 0539 07/15/23 0650 07/16/23 0626 07/22/23 0000  WBC 7.4 12.2*   < > 12.9* 13.4* 14.1* 11.0  NEUTROABS 5.1 10.9*  --   --   --   --   --   HGB 9.6* 9.7*   < > 8.5* 8.5* 8.3* 8.5*  HCT 30.0* 32.7*   < > 28.1* 28.1* 27.5* 26*  MCV 98.7 100.6*   < > 100.4* 100.4* 101.1*  --   PLT 184.0 262   < > 315 368 336 410*   < > = values in this interval not displayed.   Lab Results  Component Value Date   TSH 1.040 07/08/2023   Lab Results  Component Value Date   HGBA1C 5.6 06/30/2023   Lab Results  Component Value Date   CHOL 189 06/30/2023   HDL 86.40 06/30/2023   LDLCALC 88 06/30/2023   LDLDIRECT 155.6 07/11/2013   TRIG 75.0 06/30/2023   CHOLHDL 2 06/30/2023    Significant Diagnostic Results in last 30 days:  No results found.  Assessment/Plan 1. Anxiety (Primary) - intermittent> more at night per daughter - refused SSRI today - cont hydroxyzine BID prn> will  ask to schedule qhs  2. Left-sided weakness -  hospitalized 12/05-12/13 - CT head/MRI brain confirmed 2.4 x 1.7 cm right thalamic hemorrhage - starting to move left hand and forearm - cont PT/OT - cont skilled nursing - cont statin  3. Nontraumatic subcortical hemorrhage of right cerebral hemisphere [facility]) - see above  4. Essential (primary) hypertension - controlled> GOAL per neuro SBP 130-150 - cont losartan, nifedipine, and metorpolol  5. Type II diabetes mellitus with neurological manifestations (HCC) - A1c stable> 5.6 06/30/2023 - cont metformin  6. Iron deficiency anemia, unspecified iron deficiency anemia type - hgb 8.5 - cont ferrous sulfate  7. Esophageal dysphagia - no recent aspirations - followed by ST - cont regular diet/thin liquids  8. Stage 4 very severe COPD by GOLD classification (HCC) - followed by pulmonary - no exacerbations - cont oxygen - cont Pulmicort, stiolto and albuterol prn  9. Hypothyroidism following radioiodine therapy - TSH stable - cont levothyroxine  10. Pressure injury of sacral region, stage 2 (HCC) - ongoing - no sign of infection - on air mattress - cont frequent turning - cont Prostat and high protein Boost  11. History of lung cancer - completed radiation RUL 2011  12. History of breast  cancer - s/p right mastectomy 1991    Family/ staff Communication: plan discussed with patient and nurse  Labs/tests ordered: cbc/diff 01/23

## 2023-08-23 NOTE — Progress Notes (Signed)
Subjective:     Patient ID: Charlotte Harris, female   DOB: August 03, 1932, 89 y.o.   MRN: [id number]  HPI  Charlotte Harris is an 89 year old female who was seen today for worsening anxiety.   Pt PMH includes: ICH stroke(2024), COPD (oxygen dependent on 4L [place]), mixed hyperlipidemia, peripheral edema, Lung CA s/p radiation (2011), DMII, hypothyroidism post radioiodine therapy (2009), osteoarthritis and osteoporosis.   Patient was recently seen by provider on 12/23 where her anxiety was addressed. She stated at this visit she experienced two episodes of increased anxiety around midnight on 12/21 & 12/22 where she felt like she could not breathe. Pt appears at baseline. Displays some anxiety with a calm demeanor.   Review of Systems Constitutional: Very Frail. Positive for decreased appetite.   HEENT: Denies headache, ear pain, sore throat and dysphagia.  Respiratory: Denies shortness of breath and wheezing. Negative for cough.   Cardiovascular: Denies chest pain and leg swelling.  Gastrointestinal: Denies nausea and vomiting. Denies constipation and diarrhea. Denies abdominal pain and tenderness.   Genitourinary: Denies dysuria.  Musculoskeletal: Positive for altered mobility.   Skin:Positive for skin wounds on R side of sacrum, left forearm, and right shin.  Neurological:  Positive for weakness. Negative for dizziness and headaches.  Psychiatric/Behavioral:  Negative for confusion and abnormal mood or sleep disturbances. Positive for anxiety.  Objective:   Physical Exam  Constitutional: Frail. Pt not in acute distress.  HEENT:   Head: Normocephalic. Eyes: Sclera is clear. Conjunctiva is pink. No discharge noted. PERRLA.   Nose: No drainage.  Mouth/Throat:  Mucous membranes are pink and moist.Oropharynx is clear.   Cardiovascular:  Rate and Rhythm: Normal rate. Regular rhythm.   Pulses: +2 bilateral radial pulses.  Heart sounds: Normal heart sounds. No murmur.  Pulmonary:    Effort: Labored.Appropriate for patient condition.    Breath sounds: Normal breath sounds with mild wheezes present in the posterior right upper lobe.   Abdominal:   General: Abdomen is flat. No protruding. Bowel sounds are normal.   Palpations: Abdomen is soft and non-tender.  Musculoskeletal:     General: No swelling. Left arm weakness. Grip strength +4 in R hand and +3 in the left hand. Limited ROM in left upper extremity.   Skin:  General: Skin is fragile and warm.   Neurological:   Mental Status: She is alert and oriented to person, place, and time. Patient is anxious. Left sided weakness in both upper and lower extremities. LUE grip strength 3/5. RUE grip strength 4/5.   Assessment:  Pt is alert and oriented to person, place, time, and situation. On exam patient is chronically ill appearing. Pt requires 4L of oxygen [place] while at rest.Normal clear lung sounds heard bilaterally. Mild wheezes auscultated posteriorly on the R upper lobe. Normal heart sounds. Patient has normal pulses and capillary refill. Pt abdomen is soft and non-tender. The patient has approximately 1 cm abrasion to her left forearm. No signs of infection or active bleeding, a paper dressing is in place 1/16 -cdi. The patient has small skin tear approximately 2.5 inches in length present to patients right shin, no signs of infection noted, steri strips placed, dressing-cdi. Sacral wound is managed by wound care nurse.     Plan:    Nontraumatic subcortical hemorrhage of right cerebral hemisphere Closely monitor BP.  SNF.   2.Left sided weakness Continue to adjust skilled level of care to patients needs.  Patient should continue to work with PT/OT as scheduled and  tolerated.   3. Anxiety Patient is encouraged to take Hydroxyzine 25 mg by mouth twice a day , as ordered to help manage her symptoms of anxiety.  Patient can practice stress relief techniques to help reduce anxiety.

## 2023-08-24 DIAGNOSIS — E1159 Type 2 diabetes mellitus with other circulatory complications: Secondary | ICD-10-CM | POA: Diagnosis not present

## 2023-08-24 DIAGNOSIS — B351 Tinea unguium: Secondary | ICD-10-CM | POA: Diagnosis not present

## 2023-08-24 DIAGNOSIS — L84 Corns and callosities: Secondary | ICD-10-CM | POA: Diagnosis not present

## 2023-08-24 DIAGNOSIS — M2041 Other hammer toe(s) (acquired), right foot: Secondary | ICD-10-CM | POA: Diagnosis not present

## 2023-08-24 DIAGNOSIS — M2042 Other hammer toe(s) (acquired), left foot: Secondary | ICD-10-CM | POA: Diagnosis not present

## 2023-08-24 DIAGNOSIS — M79672 Pain in left foot: Secondary | ICD-10-CM | POA: Diagnosis not present

## 2023-08-24 DIAGNOSIS — M79671 Pain in right foot: Secondary | ICD-10-CM | POA: Diagnosis not present

## 2023-08-24 NOTE — Progress Notes (Signed)
 This encounter was created in error - please disregard.

## 2023-08-25 ENCOUNTER — Other Ambulatory Visit: Payer: Self-pay | Admitting: Orthopedic Surgery

## 2023-08-25 DIAGNOSIS — R29898 Other symptoms and signs involving the musculoskeletal system: Secondary | ICD-10-CM | POA: Diagnosis not present

## 2023-08-25 DIAGNOSIS — R262 Difficulty in walking, not elsewhere classified: Secondary | ICD-10-CM | POA: Diagnosis not present

## 2023-08-25 DIAGNOSIS — M6281 Muscle weakness (generalized): Secondary | ICD-10-CM | POA: Diagnosis not present

## 2023-08-25 DIAGNOSIS — F419 Anxiety disorder, unspecified: Secondary | ICD-10-CM

## 2023-08-25 DIAGNOSIS — I69854 Hemiplegia and hemiparesis following other cerebrovascular disease affecting left non-dominant side: Secondary | ICD-10-CM | POA: Diagnosis not present

## 2023-08-25 MED ORDER — HYDROXYZINE PAMOATE 25 MG PO CAPS: ORAL_CAPSULE | ORAL | Status: DC

## 2023-08-26 ENCOUNTER — Inpatient Hospital Stay: Payer: Medicare Other | Admitting: Adult Health

## 2023-08-26 DIAGNOSIS — D649 Anemia, unspecified: Secondary | ICD-10-CM | POA: Diagnosis not present

## 2023-08-26 LAB — CBC AND DIFFERENTIAL
HCT: 27 — AB (ref 36–46)
Hemoglobin: 8.6 — AB (ref 12.0–16.0)
Neutrophils Absolute: 4971
Platelets: 241 10*3/uL (ref 150–400)
WBC: 7.3

## 2023-08-26 LAB — CBC: RBC: 2.79 — AB (ref 3.87–5.11)

## 2023-08-27 DIAGNOSIS — I69854 Hemiplegia and hemiparesis following other cerebrovascular disease affecting left non-dominant side: Secondary | ICD-10-CM | POA: Diagnosis not present

## 2023-08-27 DIAGNOSIS — M6281 Muscle weakness (generalized): Secondary | ICD-10-CM | POA: Diagnosis not present

## 2023-08-27 DIAGNOSIS — R29898 Other symptoms and signs involving the musculoskeletal system: Secondary | ICD-10-CM | POA: Diagnosis not present

## 2023-08-27 DIAGNOSIS — R279 Unspecified lack of coordination: Secondary | ICD-10-CM | POA: Diagnosis not present

## 2023-08-30 DIAGNOSIS — R29898 Other symptoms and signs involving the musculoskeletal system: Secondary | ICD-10-CM | POA: Diagnosis not present

## 2023-08-30 DIAGNOSIS — I619 Nontraumatic intracerebral hemorrhage, unspecified: Secondary | ICD-10-CM | POA: Diagnosis not present

## 2023-08-30 DIAGNOSIS — M6281 Muscle weakness (generalized): Secondary | ICD-10-CM | POA: Diagnosis not present

## 2023-08-30 DIAGNOSIS — R279 Unspecified lack of coordination: Secondary | ICD-10-CM | POA: Diagnosis not present

## 2023-08-30 DIAGNOSIS — R1319 Other dysphagia: Secondary | ICD-10-CM | POA: Diagnosis not present

## 2023-08-30 DIAGNOSIS — E43 Unspecified severe protein-calorie malnutrition: Secondary | ICD-10-CM | POA: Diagnosis not present

## 2023-08-30 DIAGNOSIS — J449 Chronic obstructive pulmonary disease, unspecified: Secondary | ICD-10-CM | POA: Diagnosis not present

## 2023-08-30 DIAGNOSIS — I69854 Hemiplegia and hemiparesis following other cerebrovascular disease affecting left non-dominant side: Secondary | ICD-10-CM | POA: Diagnosis not present

## 2023-08-31 ENCOUNTER — Telehealth: Payer: Self-pay | Admitting: Pulmonary Disease

## 2023-08-31 DIAGNOSIS — R279 Unspecified lack of coordination: Secondary | ICD-10-CM | POA: Diagnosis not present

## 2023-08-31 DIAGNOSIS — I69854 Hemiplegia and hemiparesis following other cerebrovascular disease affecting left non-dominant side: Secondary | ICD-10-CM | POA: Diagnosis not present

## 2023-08-31 DIAGNOSIS — M6281 Muscle weakness (generalized): Secondary | ICD-10-CM | POA: Diagnosis not present

## 2023-08-31 DIAGNOSIS — R29898 Other symptoms and signs involving the musculoskeletal system: Secondary | ICD-10-CM | POA: Diagnosis not present

## 2023-08-31 NOTE — Telephone Encounter (Signed)
Stiolo inhaler is too expensive for the patient and she is in need of an alternative. Please call [facility] with an Alternative.

## 2023-09-01 NOTE — Telephone Encounter (Signed)
ATC X1. LMTCB. 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 Please advise.

## 2023-09-02 DIAGNOSIS — R279 Unspecified lack of coordination: Secondary | ICD-10-CM | POA: Diagnosis not present

## 2023-09-02 DIAGNOSIS — M6281 Muscle weakness (generalized): Secondary | ICD-10-CM | POA: Diagnosis not present

## 2023-09-02 DIAGNOSIS — R262 Difficulty in walking, not elsewhere classified: Secondary | ICD-10-CM | POA: Diagnosis not present

## 2023-09-02 DIAGNOSIS — R29898 Other symptoms and signs involving the musculoskeletal system: Secondary | ICD-10-CM | POA: Diagnosis not present

## 2023-09-02 DIAGNOSIS — I69854 Hemiplegia and hemiparesis following other cerebrovascular disease affecting left non-dominant side: Secondary | ICD-10-CM | POA: Diagnosis not present

## 2023-09-03 DIAGNOSIS — R29898 Other symptoms and signs involving the musculoskeletal system: Secondary | ICD-10-CM | POA: Diagnosis not present

## 2023-09-03 DIAGNOSIS — R279 Unspecified lack of coordination: Secondary | ICD-10-CM | POA: Diagnosis not present

## 2023-09-03 DIAGNOSIS — M6281 Muscle weakness (generalized): Secondary | ICD-10-CM | POA: Diagnosis not present

## 2023-09-03 DIAGNOSIS — I69854 Hemiplegia and hemiparesis following other cerebrovascular disease affecting left non-dominant side: Secondary | ICD-10-CM | POA: Diagnosis not present

## 2023-09-06 DIAGNOSIS — E43 Unspecified severe protein-calorie malnutrition: Secondary | ICD-10-CM | POA: Diagnosis not present

## 2023-09-06 DIAGNOSIS — I69854 Hemiplegia and hemiparesis following other cerebrovascular disease affecting left non-dominant side: Secondary | ICD-10-CM | POA: Diagnosis not present

## 2023-09-06 DIAGNOSIS — J449 Chronic obstructive pulmonary disease, unspecified: Secondary | ICD-10-CM | POA: Diagnosis not present

## 2023-09-06 DIAGNOSIS — R1319 Other dysphagia: Secondary | ICD-10-CM | POA: Diagnosis not present

## 2023-09-06 DIAGNOSIS — R279 Unspecified lack of coordination: Secondary | ICD-10-CM | POA: Diagnosis not present

## 2023-09-06 DIAGNOSIS — R29898 Other symptoms and signs involving the musculoskeletal system: Secondary | ICD-10-CM | POA: Diagnosis not present

## 2023-09-06 DIAGNOSIS — M6281 Muscle weakness (generalized): Secondary | ICD-10-CM | POA: Diagnosis not present

## 2023-09-06 DIAGNOSIS — I619 Nontraumatic intracerebral hemorrhage, unspecified: Secondary | ICD-10-CM | POA: Diagnosis not present

## 2023-09-06 NOTE — Telephone Encounter (Signed)
Pharm team- can you please see what comparable inhalers are covered since she says stiolto is too expensive? Thanks!

## 2023-09-07 ENCOUNTER — Telehealth: Payer: Self-pay

## 2023-09-07 ENCOUNTER — Other Ambulatory Visit (HOSPITAL_COMMUNITY): Payer: Self-pay

## 2023-09-07 DIAGNOSIS — R262 Difficulty in walking, not elsewhere classified: Secondary | ICD-10-CM | POA: Diagnosis not present

## 2023-09-07 DIAGNOSIS — I69854 Hemiplegia and hemiparesis following other cerebrovascular disease affecting left non-dominant side: Secondary | ICD-10-CM | POA: Diagnosis not present

## 2023-09-07 DIAGNOSIS — M6281 Muscle weakness (generalized): Secondary | ICD-10-CM | POA: Diagnosis not present

## 2023-09-07 DIAGNOSIS — R29898 Other symptoms and signs involving the musculoskeletal system: Secondary | ICD-10-CM | POA: Diagnosis not present

## 2023-09-07 DIAGNOSIS — R279 Unspecified lack of coordination: Secondary | ICD-10-CM | POA: Diagnosis not present

## 2023-09-07 NOTE — Telephone Encounter (Signed)
 Pharmacy Patient Advocate Encounter  Insurance verification completed.   The patient is insured through [employer] claim for [employer]. Currently a quantity of 10.7g is a 30 day supply and the co-pay is $47.00 .  Ran test claim for Anoro . Currently a quantity of 60 is a 30 day supply and the co-pay is $47.00 .  This test claim was processed through [facility]- copay amounts may vary at other pharmacies due to pharmacy/plan contracts, or as the patient moves through the different stages of their insurance plan.

## 2023-09-07 NOTE — Telephone Encounter (Signed)
Routing to DOD to ask about alternative inhaler for this former [employer] pt Can not afford stiolto  Bevespi and anoro are covered alternatives  Please advise, thanks

## 2023-09-07 NOTE — Telephone Encounter (Signed)
Please send Bevespi 2 puff twice a day, qty 1, refills 3. Recommend f/u with any provider in next 2-3 months.

## 2023-09-07 NOTE — Telephone Encounter (Signed)
Test claim shows the following:   Bevespi $47.00 Anoro $47.00

## 2023-09-08 DIAGNOSIS — I69854 Hemiplegia and hemiparesis following other cerebrovascular disease affecting left non-dominant side: Secondary | ICD-10-CM | POA: Diagnosis not present

## 2023-09-08 DIAGNOSIS — M6281 Muscle weakness (generalized): Secondary | ICD-10-CM | POA: Diagnosis not present

## 2023-09-08 DIAGNOSIS — R279 Unspecified lack of coordination: Secondary | ICD-10-CM | POA: Diagnosis not present

## 2023-09-08 DIAGNOSIS — R29898 Other symptoms and signs involving the musculoskeletal system: Secondary | ICD-10-CM | POA: Diagnosis not present

## 2023-09-08 NOTE — Telephone Encounter (Signed)
 Lm x1 for patient.

## 2023-09-09 DIAGNOSIS — R29898 Other symptoms and signs involving the musculoskeletal system: Secondary | ICD-10-CM | POA: Diagnosis not present

## 2023-09-09 DIAGNOSIS — R262 Difficulty in walking, not elsewhere classified: Secondary | ICD-10-CM | POA: Diagnosis not present

## 2023-09-09 DIAGNOSIS — M6281 Muscle weakness (generalized): Secondary | ICD-10-CM | POA: Diagnosis not present

## 2023-09-09 DIAGNOSIS — R279 Unspecified lack of coordination: Secondary | ICD-10-CM | POA: Diagnosis not present

## 2023-09-09 DIAGNOSIS — I69854 Hemiplegia and hemiparesis following other cerebrovascular disease affecting left non-dominant side: Secondary | ICD-10-CM | POA: Diagnosis not present

## 2023-09-09 NOTE — Telephone Encounter (Signed)
 Called [facility]. Unable to leave message. VM either full or unavailable. Will try again later.

## 2023-09-10 DIAGNOSIS — R29898 Other symptoms and signs involving the musculoskeletal system: Secondary | ICD-10-CM | POA: Diagnosis not present

## 2023-09-10 DIAGNOSIS — I69854 Hemiplegia and hemiparesis following other cerebrovascular disease affecting left non-dominant side: Secondary | ICD-10-CM | POA: Diagnosis not present

## 2023-09-10 DIAGNOSIS — M6281 Muscle weakness (generalized): Secondary | ICD-10-CM | POA: Diagnosis not present

## 2023-09-10 DIAGNOSIS — R262 Difficulty in walking, not elsewhere classified: Secondary | ICD-10-CM | POA: Diagnosis not present

## 2023-09-10 DIAGNOSIS — R279 Unspecified lack of coordination: Secondary | ICD-10-CM | POA: Diagnosis not present

## 2023-09-13 DIAGNOSIS — R279 Unspecified lack of coordination: Secondary | ICD-10-CM | POA: Diagnosis not present

## 2023-09-13 DIAGNOSIS — M6281 Muscle weakness (generalized): Secondary | ICD-10-CM | POA: Diagnosis not present

## 2023-09-13 DIAGNOSIS — I69854 Hemiplegia and hemiparesis following other cerebrovascular disease affecting left non-dominant side: Secondary | ICD-10-CM | POA: Diagnosis not present

## 2023-09-13 DIAGNOSIS — R29898 Other symptoms and signs involving the musculoskeletal system: Secondary | ICD-10-CM | POA: Diagnosis not present

## 2023-09-14 DIAGNOSIS — R29898 Other symptoms and signs involving the musculoskeletal system: Secondary | ICD-10-CM | POA: Diagnosis not present

## 2023-09-14 DIAGNOSIS — R262 Difficulty in walking, not elsewhere classified: Secondary | ICD-10-CM | POA: Diagnosis not present

## 2023-09-14 DIAGNOSIS — M6281 Muscle weakness (generalized): Secondary | ICD-10-CM | POA: Diagnosis not present

## 2023-09-14 DIAGNOSIS — I69854 Hemiplegia and hemiparesis following other cerebrovascular disease affecting left non-dominant side: Secondary | ICD-10-CM | POA: Diagnosis not present

## 2023-09-15 DIAGNOSIS — R29898 Other symptoms and signs involving the musculoskeletal system: Secondary | ICD-10-CM | POA: Diagnosis not present

## 2023-09-15 DIAGNOSIS — I619 Nontraumatic intracerebral hemorrhage, unspecified: Secondary | ICD-10-CM | POA: Diagnosis not present

## 2023-09-15 DIAGNOSIS — R279 Unspecified lack of coordination: Secondary | ICD-10-CM | POA: Diagnosis not present

## 2023-09-15 DIAGNOSIS — E43 Unspecified severe protein-calorie malnutrition: Secondary | ICD-10-CM | POA: Diagnosis not present

## 2023-09-15 DIAGNOSIS — I69854 Hemiplegia and hemiparesis following other cerebrovascular disease affecting left non-dominant side: Secondary | ICD-10-CM | POA: Diagnosis not present

## 2023-09-15 DIAGNOSIS — R1319 Other dysphagia: Secondary | ICD-10-CM | POA: Diagnosis not present

## 2023-09-15 DIAGNOSIS — J449 Chronic obstructive pulmonary disease, unspecified: Secondary | ICD-10-CM | POA: Diagnosis not present

## 2023-09-15 DIAGNOSIS — M6281 Muscle weakness (generalized): Secondary | ICD-10-CM | POA: Diagnosis not present

## 2023-09-16 DIAGNOSIS — M6281 Muscle weakness (generalized): Secondary | ICD-10-CM | POA: Diagnosis not present

## 2023-09-16 DIAGNOSIS — R262 Difficulty in walking, not elsewhere classified: Secondary | ICD-10-CM | POA: Diagnosis not present

## 2023-09-16 DIAGNOSIS — R279 Unspecified lack of coordination: Secondary | ICD-10-CM | POA: Diagnosis not present

## 2023-09-16 DIAGNOSIS — I69854 Hemiplegia and hemiparesis following other cerebrovascular disease affecting left non-dominant side: Secondary | ICD-10-CM | POA: Diagnosis not present

## 2023-09-16 DIAGNOSIS — R29898 Other symptoms and signs involving the musculoskeletal system: Secondary | ICD-10-CM | POA: Diagnosis not present

## 2023-09-16 NOTE — Telephone Encounter (Signed)
I called and spoke with the pt's nurse at [facility]  Was advised that the pt was able to get 90 day supply of the stiolto  Nothing further needed  Closing this encounter

## 2023-09-17 DIAGNOSIS — R29898 Other symptoms and signs involving the musculoskeletal system: Secondary | ICD-10-CM | POA: Diagnosis not present

## 2023-09-17 DIAGNOSIS — R279 Unspecified lack of coordination: Secondary | ICD-10-CM | POA: Diagnosis not present

## 2023-09-17 DIAGNOSIS — M6281 Muscle weakness (generalized): Secondary | ICD-10-CM | POA: Diagnosis not present

## 2023-09-17 DIAGNOSIS — I69854 Hemiplegia and hemiparesis following other cerebrovascular disease affecting left non-dominant side: Secondary | ICD-10-CM | POA: Diagnosis not present

## 2023-09-17 DIAGNOSIS — R262 Difficulty in walking, not elsewhere classified: Secondary | ICD-10-CM | POA: Diagnosis not present

## 2023-09-19 ENCOUNTER — Other Ambulatory Visit: Payer: Self-pay | Admitting: Pulmonary Disease

## 2023-09-20 DIAGNOSIS — I69854 Hemiplegia and hemiparesis following other cerebrovascular disease affecting left non-dominant side: Secondary | ICD-10-CM | POA: Diagnosis not present

## 2023-09-20 DIAGNOSIS — M6281 Muscle weakness (generalized): Secondary | ICD-10-CM | POA: Diagnosis not present

## 2023-09-20 DIAGNOSIS — R29898 Other symptoms and signs involving the musculoskeletal system: Secondary | ICD-10-CM | POA: Diagnosis not present

## 2023-09-20 DIAGNOSIS — R279 Unspecified lack of coordination: Secondary | ICD-10-CM | POA: Diagnosis not present

## 2023-09-21 ENCOUNTER — Encounter: Payer: Self-pay | Admitting: Family Medicine

## 2023-09-21 ENCOUNTER — Ambulatory Visit: Payer: Medicare Other | Admitting: Family Medicine

## 2023-09-21 VITALS — BP 112/58 | HR 80 | Ht 62.0 in | Wt 102.0 lb

## 2023-09-21 DIAGNOSIS — E118 Type 2 diabetes mellitus with unspecified complications: Secondary | ICD-10-CM

## 2023-09-21 DIAGNOSIS — E782 Mixed hyperlipidemia: Secondary | ICD-10-CM

## 2023-09-21 DIAGNOSIS — I10 Essential (primary) hypertension: Secondary | ICD-10-CM | POA: Diagnosis not present

## 2023-09-21 DIAGNOSIS — M6281 Muscle weakness (generalized): Secondary | ICD-10-CM | POA: Diagnosis not present

## 2023-09-21 DIAGNOSIS — I69854 Hemiplegia and hemiparesis following other cerebrovascular disease affecting left non-dominant side: Secondary | ICD-10-CM | POA: Diagnosis not present

## 2023-09-21 DIAGNOSIS — I610 Nontraumatic intracerebral hemorrhage in hemisphere, subcortical: Secondary | ICD-10-CM

## 2023-09-21 DIAGNOSIS — J9621 Acute and chronic respiratory failure with hypoxia: Secondary | ICD-10-CM

## 2023-09-21 DIAGNOSIS — Z7984 Long term (current) use of oral hypoglycemic drugs: Secondary | ICD-10-CM | POA: Diagnosis not present

## 2023-09-21 DIAGNOSIS — R29898 Other symptoms and signs involving the musculoskeletal system: Secondary | ICD-10-CM | POA: Diagnosis not present

## 2023-09-21 DIAGNOSIS — R262 Difficulty in walking, not elsewhere classified: Secondary | ICD-10-CM | POA: Diagnosis not present

## 2023-09-21 NOTE — Progress Notes (Signed)
[facility] [address] [place]. [place] [address] [phone]       HOSPITAL FOLLOW UP NOTE  Ms. TERRIE HARING Date of Birth:  April 04, 1932 Medical Record Number:  [id number]   Reason for Referral:  hospital stroke follow up    SUBJECTIVE:   CHIEF COMPLAINT:  Chief Complaint  Patient presents with   Follow-up    Pt in 1 with son Pt here for stroke f/u Pt currently on oxygen (4L) Pt currently in wheelchair Pt states unable to walk Pt states left side weakness Pt states tingling in left hand     HPI:   MALU PELLEGRINI is a 89 y.o. who  has a past medical history of ADENOCARCINOMA, LUNG (07/03/2010), ASYMPTOMATIC POSTMENOPAUSAL STATUS (07/02/2009), Breast cancer (HCC) (1991), COLONIC POLYPS, HX OF (04/16/2007), COPD (05/17/2008), HEMATURIA, HX OF (04/16/2007), History of radiation therapy (07/30/09 to 08/09/09), HYPERLIPIDEMIA (04/16/2007), Hypertension, HYPERTENSION (04/16/2007), HYPOTHYROIDISM, POST-RADIATION (06/07/2008), Osteoarthritis, OSTEOPOROSIS (07/02/2009), and Wrist fracture, left.  Patient presented on 07/08/2023 following a fall with left facial droop, left sided weakness and numbness. 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 NIH on admission 11. DNR established. Hospitalization complicated by acute on chronic respiratory failure. Treated with abx for aspiration PNA. NG tube placed and removed in hospital. Bilateral cerebral ICA aneurysms found on imaging. No intervention, follow up outpatient recommended. LDL 88. Pravastatin increased to 40mg  daily. A1C 5.6 on metformin. Personally reviewed hospitalization pertinent progress notes, lab work and imaging.  Evaluated by Dr Roda Shutters.   Since discharge, she reports doing fairly well. She now resides at [facility]. Followed by Dr Chales Abrahams. Left sided weakness is improving. She is able to move left hand and grip is getting stronger. Continues to work with PT/OT. She is wheelchair bound. PT recently worked with  her to stand but she has not been able to walk. She is working on chair exercises for strengthen lower ext. Swallowing is back to baseline. ST completed. No cognitive impairments. BP well managed on losartan, Nifedipine and metoprolol.   Bilateral ICA aneurysms found in hospital. She is adamant that she is not interested in surgical management. She has not discussed with PCP.    PERTINENT IMAGING/LABS  CT head  2.4x1.7cm right thalamic hemorrhage with IVE. Age-indeterminate infarcts in left basal ganglia CTA head & neck No evidecne of acute extravasation, no intracranial LVO Follow-up CT: Unchanged hematomas extending into right lateral ventricle MRI  Unchanged IPH with IVE 2D Echo: LVEF 60 to 65%, mild LVH, moderate aortic valve calcification, no shunt   A1C Lab Results  Component Value Date   HGBA1C 5.6 06/30/2023    Lipid Panel     Component Value Date/Time   CHOL 189 06/30/2023 1107   TRIG 75.0 06/30/2023 1107   HDL 86.40 06/30/2023 1107   CHOLHDL 2 06/30/2023 1107   VLDL 15.0 06/30/2023 1107   LDLCALC 88 06/30/2023 1107   LDLDIRECT 155.6 07/11/2013 1517      ROS:   14 system review of systems performed and negative with exception of those listed in HPI  PMH:  Past Medical History:  Diagnosis Date   ADENOCARCINOMA, LUNG 07/03/2010   RUL   ASYMPTOMATIC POSTMENOPAUSAL STATUS 07/02/2009   Breast cancer (HCC) 1991   R mastectomy, no chemo or radiation   COLONIC POLYPS, HX OF 04/16/2007   COPD 05/17/2008   HEMATURIA, HX OF 04/16/2007   History of radiation therapy 07/30/09 to 08/09/09   RUL lung   HYPERLIPIDEMIA 04/16/2007   Hypertension  HYPERTENSION 04/16/2007   HYPOTHYROIDISM, POST-RADIATION 06/07/2008   Osteoarthritis    OSTEOPOROSIS 07/02/2009   Wrist fracture, left     PSH:  Past Surgical History:  Procedure Laterality Date   BREAST SURGERY     lumpectomy   EYE SURGERY Bilateral    cataracts   LUNG BIOPSY  08/31/11   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& focal slight  atypia   LUNG BIOPSY  05/22/2009   RUL lung=Adenocarcinoma   MASTECTOMY  1991   right   TONSILLECTOMY  1955    Social History:  Social History   Socioeconomic History   Marital status: Widowed    Spouse name: Not on file   Number of children: Not on file   Years of education: Not on file   Highest education level: 12th grade  Occupational History   Occupation: Retired (worked Public librarian)    Employer: RETIRED  Tobacco Use   Smoking status: Former    Current packs/day: 0.00    Average packs/day: 1 pack/day for 50.0 years (50.0 ttl pk-yrs)    Types: Cigarettes    Start date: 08/04/1959    Quit date: 08/03/2009    Years since quitting: 14.1   Smokeless tobacco: Never  Vaping Use   Vaping status: Never Used  Substance and Sexual Activity   Alcohol use: Yes    Alcohol/week: 2.0 standard drinks of alcohol    Types: 2 Glasses of wine per week   Drug use: No   Sexual activity: Not on file  Other Topics Concern   Not on file  Social History Narrative   Widowed 2005Quit smoking 2010No other exposures, no TB hx   [facility]    Social Drivers of Health   Financial Resource Strain: Low Risk  (05/10/2022)   Overall Financial Resource Strain (CARDIA)    Difficulty of Paying Living Expenses: Not hard at all  Food Insecurity: No Food Insecurity (07/30/2023)   Hunger Vital Sign    Worried About Running Out of Food in the Last Year: Never true    Ran Out of Food in the Last Year: Never true  Transportation Needs: No Transportation Needs (07/30/2023)   PRAPARE - Administrator, Civil Service (Medical): No    Lack of Transportation (Non-Medical): No  Physical Activity: Unknown (05/10/2022)   Exercise Vital Sign    Days of Exercise per Week: 0 days    Minutes of Exercise per Session: Not on file  Recent Concern: Physical Activity - Inactive (05/10/2022)   Exercise Vital Sign    Days of Exercise per Week: 0 days    Minutes of Exercise per Session:  0 min  Stress: No Stress Concern Present (05/10/2022)   [employer] of Occupational Health - Occupational Stress Questionnaire    Feeling of Stress : Not at all  Social Connections: Unknown (07/30/2023)   Social Connection and Isolation Panel [NHANES]    Frequency of Communication with Friends and Family: Twice a week    Frequency of Social Gatherings with Friends and Family: Patient declined    Attends Religious Services: More than 4 times per year    Active Member of [employer] or Organizations: No    Attends Banker Meetings: Never    Marital Status: Widowed  Intimate Partner Violence: Not At Risk (07/30/2023)   Humiliation, Afraid, Rape, and Kick questionnaire    Fear of Current or Ex-Partner: No    Emotionally Abused: No    Physically Abused: No  Sexually Abused: No    Family History:  Family History  Problem Relation Age of Onset   Heart attack Mother 47   Heart disease Mother    Heart disease Father    Cancer Sister        "throat" cancer   Heart disease Sister    Cancer Brother        blood   Cancer Brother        blood   Heart disease Other        CAD   Hypertension Other    Colon cancer Neg Hx    Stomach cancer Neg Hx    Stroke Neg Hx     Medications:   Current Outpatient Medications on File Prior to Visit  Medication Sig Dispense Refill   acetaminophen (TYLENOL) 500 MG tablet Take 500 mg by mouth every 6 (six) hours as needed for headache (pain).      albuterol (VENTOLIN HFA) 108 (90 Base) MCG/ACT inhaler Inhale 2 puffs into the lungs every 6 (six) hours as needed for wheezing or shortness of breath. 8 g 5   Amino Acids-Protein Hydrolys (PRO-STAT PO) Take 30 mLs by mouth daily.     budesonide (PULMICORT) 0.25 MG/2ML nebulizer solution USE (0.25 MG TOTAL) BY NEBULIZATION TWICE A DAY. 180 mL 3   Calcium Carb-Cholecalciferol (CALCIUM 1000 + D PO) Take 2 tablets by mouth daily.     FERROUS SULFATE PO Take 1 tablet by mouth every morning.      hydrOXYzine (VISTARIL) 25 MG capsule Take 1 capsule (25 mg total) by mouth at bedtime. May also take 1 capsule (25 mg total) daily as needed.     levothyroxine (SYNTHROID) 50 MCG tablet TAKE 1 TABLET BY MOUTH EVERY DAY BEFORE BREAKFAST 90 tablet 3   losartan (COZAAR) 100 MG tablet Take 1 tablet (100 mg total) by mouth daily. 90 tablet 3   metFORMIN (GLUCOPHAGE-XR) 500 MG 24 hr tablet Take 1 tablet (500 mg total) by mouth daily. 90 tablet 1   metoprolol succinate (TOPROL-XL) 25 MG 24 hr tablet Take 1 tablet (25 mg total) by mouth daily. 90 tablet 1   Multiple Vitamins-Minerals (PRESERVISION AREDS 2+MULTI VIT) CAPS Take 1 capsule by mouth every morning.     NIFEdipine (PROCARDIA XL/NIFEDICAL-XL) 90 MG 24 hr tablet Take 1 tablet (90 mg total) by mouth daily.     pravastatin (PRAVACHOL) 40 MG tablet Place 1 tablet (40 mg total) into feeding tube daily at 6 PM.     senna-docusate (SENOKOT-S) 8.6-50 MG tablet Take 1 tablet by mouth as needed for mild constipation.     Tiotropium Bromide-Olodaterol 2.5-2.5 MCG/ACT AERS Inhale 2 puffs into the lungs every morning.     vitamin B-12 (CYANOCOBALAMIN) 100 MCG tablet Take 100 mcg by mouth daily.     zinc oxide 20 % ointment Apply 1 Application topically as needed for irritation.     No current facility-administered medications on file prior to visit.    Allergies:   Allergies  Allergen Reactions   Crestor [Rosuvastatin Calcium]     cramping      OBJECTIVE:  Physical Exam  Vitals:   09/21/23 1319  BP: (!) 112/58  Pulse: 80  Weight: 102 lb (46.3 kg)  Height: 5\' 2"  (1.575 m)   Body mass index is 18.66 kg/m. No results found.     07/30/2023   11:08 AM  Depression screen PHQ 2/9  Decreased Interest 1  Down, Depressed, Hopeless 1  PHQ -  2 Score 2  Altered sleeping 0  Tired, decreased energy 1  Change in appetite 0  Feeling bad or failure about yourself  0  Trouble concentrating 0  Moving slowly or fidgety/restless 0  Suicidal  thoughts 0  PHQ-9 Score 3     General: well developed, well nourished, seated, in no evident distress Head: head normocephalic and atraumatic.   Neck: supple with no carotid or supraclavicular bruits Cardiovascular: regular rate and rhythm, no murmurs Musculoskeletal: no deformity Skin:  no rash/petichiae Vascular:  Normal pulses all extremities   Neurologic Exam Mental Status: Awake and fully alert.  Fluent speech and language.  Oriented to place and time. Recent and remote memory intact. Attention span, concentration and fund of knowledge appropriate. Mood and affect appropriate.  Cranial Nerves: Fundoscopic exam reveals sharp disc margins. Pupils equal, briskly reactive to light. Extraocular movements full without nystagmus. Visual fields full to confrontation. Hearing intact. Facial sensation intact. Face, tongue, palate moves normally and symmetrically.  Motor: Normal bulk and tone. Normal strength in all tested extremity muscles Sensory.: intact to touch , pinprick , position and vibratory sensation.  Coordination: Rapid alternating movements normal in all extremities. Finger-to-nose and heel-to-shin performed accurately bilaterally. Gait and Station: unable to assess as she is wheel chair bound at this time  Reflexes: 1+ and symmetric.    NIHSS  2 Modified Rankin  1    ASSESSMENT: NAKAYA MISHKIN is a 89 y.o. year old female on 07/08/2023 with left facial droop, left sided weakness and numbess. Vascular risk factors include HTN, HLD, DMT2, hx cancer, former smoker.     PLAN:  ICH: right thalamic ICH with IVH, etiology likely hypertensive due to location and risk factors.  : Residual deficit: left sided weakness upper and lower ext.. Continue No antithrombotic  and pravastatin 40mg  daily for secondary stroke prevention.  Discussed secondary stroke prevention measures and importance of close PCP follow up for aggressive stroke risk factor management. I have gone over the  pathophysiology of stroke, warning signs and symptoms, risk factors and their management in some detail with instructions to go to the closest emergency room for symptoms of concern. HTN: BP goal <130/90.  Stable. Continue losartan, metoprolol and nifedipine per PCP HLD: LDL goal <70. Recent LDL 88. Continue pravastatin 40mg  daily per PCP.  DMII: A1c goal<7.0. Recent A1c 5.6. Continue metformin as directed by PCP.  Bilateral cavernous ICA aneurysms: 14mm on left, 4mm on right. Could consider referral to vascular versus neurosurgery. Not interested in surgical management at this time. Continue to monitor with PCP.  Hx adenocarcinoma right upper lung: s/p radiation. Continue close follow up with oncology.  Meningioma: unchanged, CT and MRI 0.8x3.2cm parafalcine meningioma. Will monitor for now.    Follow up as needed   CC:  [facility] provider: Dr. Pearlean Brownie PCP: Mahlon Gammon, MD    I spent 45 minutes of face-to-face and non-face-to-face time with patient.  This included previsit chart review including review of recent hospitalization, lab review, study review, order entry, electronic health record documentation, patient education regarding recent stroke including etiology, secondary stroke prevention measures and importance of managing stroke risk factors, residual deficits and typical recovery time and answered all other questions to patient satisfaction   Shawnie Dapper, [facility]  [facility] [address] [place], [place] [address]  Phone [phone] Fax [phone] Note: This document was prepared with digital dictation and possible smart phrase technology. Any transcriptional errors that result from this process are unintentional.

## 2023-09-21 NOTE — Patient Instructions (Signed)
Below is our plan:  ICH: right thalamic ICH with IVH, etiology likely hypertensive due to location and risk factors.  : Residual deficit: left sided weakness upper and lower ext.. Continue No antithrombotic  and pravastatin 40mg  daily for secondary stroke prevention.  Discussed secondary stroke prevention measures and importance of close PCP follow up for aggressive stroke risk factor management. I have gone over the pathophysiology of stroke, warning signs and symptoms, risk factors and their management in some detail with instructions to go to the closest emergency room for symptoms of concern. HTN: BP goal <130/90.  Stable. Continue losartan, metoprolol and nifedipine per PCP HLD: LDL goal <70. Recent LDL 88. Continue pravastatin 40mg  daily per PCP.  DMII: A1c goal<7.0. Recent A1c 5.6. Continue metformin as directed by PCP.  Bilateral cavernous ICA aneurysms: 14mm on left, 4mm on right. Could consider referral to vascular versus neurosurgery. Not interested in surgical management at this time. Continue to monitor with PCP.  Hx adenocarcinoma right upper lung: s/p radiation. Continue close follow up with oncology.  Meningioma: unchanged, CT and MRI 0.8x3.2cm parafalcine meningioma. Will monitor for now.   Goals:  1) Maintain strict control of hypertension with blood pressure goal below 130/90 2) Maintain good control of diabetes with hemoglobin A1c goal below 7%  3) Maintain good control of lipids with LDL cholesterol goal below 70 mg/dL.  4) Eat a healthy diet with plenty of whole grains, cereals, fruits and vegetables, exercise regularly and maintain ideal body weight   Resources: [email or web address].[email or web address]  Please make sure you are staying well hydrated. I recommend 50-60 ounces daily. Well balanced diet and regular exercise encouraged. Consistent sleep schedule with 6-8 hours recommended.   Please continue follow up with care  team as directed.   Follow up with me as needed  You may receive a survey regarding today's visit. I encourage you to leave honest feed back as I do use this information to improve patient care. Thank you for seeing me today!

## 2023-09-22 ENCOUNTER — Non-Acute Institutional Stay (SKILLED_NURSING_FACILITY): Payer: Medicare Other | Admitting: Orthopedic Surgery

## 2023-09-22 ENCOUNTER — Encounter: Payer: Self-pay | Admitting: Orthopedic Surgery

## 2023-09-22 DIAGNOSIS — Z853 Personal history of malignant neoplasm of breast: Secondary | ICD-10-CM | POA: Diagnosis not present

## 2023-09-22 DIAGNOSIS — E1149 Type 2 diabetes mellitus with other diabetic neurological complication: Secondary | ICD-10-CM | POA: Diagnosis not present

## 2023-09-22 DIAGNOSIS — D509 Iron deficiency anemia, unspecified: Secondary | ICD-10-CM

## 2023-09-22 DIAGNOSIS — J449 Chronic obstructive pulmonary disease, unspecified: Secondary | ICD-10-CM | POA: Diagnosis not present

## 2023-09-22 DIAGNOSIS — F419 Anxiety disorder, unspecified: Secondary | ICD-10-CM

## 2023-09-22 DIAGNOSIS — E890 Postprocedural hypothyroidism: Secondary | ICD-10-CM

## 2023-09-22 DIAGNOSIS — R1319 Other dysphagia: Secondary | ICD-10-CM

## 2023-09-22 DIAGNOSIS — Z85118 Personal history of other malignant neoplasm of bronchus and lung: Secondary | ICD-10-CM | POA: Diagnosis not present

## 2023-09-22 DIAGNOSIS — R531 Weakness: Secondary | ICD-10-CM | POA: Diagnosis not present

## 2023-09-22 DIAGNOSIS — I10 Essential (primary) hypertension: Secondary | ICD-10-CM | POA: Diagnosis not present

## 2023-09-22 MED ORDER — SERTRALINE HCL 25 MG PO TABS: 25.0000 mg | ORAL_TABLET | Freq: Every day | ORAL | Status: DC

## 2023-09-22 NOTE — Progress Notes (Signed)
Location:   [facility] Home Room Number: 16-A Place of Service:  SNF [phone]) Provider:  Hazle Nordmann, NP  PCP: Mahlon Gammon, MD  Patient Care Team: Mahlon Gammon, MD as PCP - General (Internal Medicine) Si Gaul, MD as Consulting Physician (Oncology) Dorothy Puffer, MD as Consulting Physician (Radiation Oncology) Meryl Dare, MD (Inactive) as Consulting Physician (Gastroenterology) Glenford Peers, OD as Referring Physician (Optometry)  Extended Emergency Contact Information Primary Emergency Contact: Albornoz,Celie Mobile Phone: [phone] Relation: Daughter Secondary Emergency Contact: Boatman,Richard Address: [address]          [place], [place] [address] Darden [place] of [place] Phone: [phone] Relation: Son  Code Status:  DNR Goals of care: Advanced Directive information    09/22/2023   10:11 AM  Advanced Directives  Does Patient Have a Medical Advance Directive? Yes  Type of Estate agent of [place];Out of facility DNR (pink MOST or yellow form)  Does patient want to make changes to medical advance directive? No - Patient declined  Copy of Healthcare Power of Attorney in Chart? Yes - validated most recent copy scanned in chart (See row information)     Chief Complaint  Patient presents with   Medical Management of Chronic Issues    Routine Visit.    Immunizations    Discuss the need for [employer].    Health Maintenance    Discuss the need for Eye exam.     HPI:  Pt is a 89 y.o. female seen today for medical management of chronic diseases.    She currently resides on the skilled nursing unit at [facility]. PMH: HTN,HLD, lung cancer s/p radiation RUL (2011), breast cancer s/p right mastectomy> no chemo/radiation (1991), COPD, hypothyroidism post radiation, OA, osteoporosis, and recent unstable gait.    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, lef sided weakness has improved, 02/18 neurology f/u> continue PT/OT and goal bp < 130/90 HTN- BUN/creat 20/0.59 07/22/2023, remains on losartan, Nifedipine and metoprolol T2DM- A1c 5.6 06/30/2023, remains on metformin Anemia- hgb 8.6 08/26/2023, remains on ferrous sulfate Esophageal dysphagia- aspiration PNA last hospitalization, followed by ST, remains on regular diet COPD stage 4- continuous oxygen, remains on Pulmicort, stiolto and albuterol prn Hypothyroidism- TSH 1.90 06/30/2023, remains on levothyroxine H/o lung cancer- radiation to RUL 2011 H/o breast cancer- right mastectomy 1991   Recent blood pressures:  02/18- 118/71  02/11- 113/57  02/04- 122/61  Recent weights:  02/01- 98.4 lbs  01/06- 98.4 lbs  12/13- 99 lbs   Past Medical History:  Diagnosis Date   ADENOCARCINOMA, LUNG 07/03/2010   RUL   ASYMPTOMATIC POSTMENOPAUSAL STATUS 07/02/2009   Breast cancer (HCC) 1991   R mastectomy, no chemo or radiation   COLONIC POLYPS, HX OF 04/16/2007   COPD 05/17/2008   HEMATURIA, HX OF 04/16/2007   History of radiation therapy 07/30/09 to 08/09/09   RUL lung   HYPERLIPIDEMIA 04/16/2007   Hypertension    HYPERTENSION 04/16/2007   HYPOTHYROIDISM, POST-RADIATION 06/07/2008   Osteoarthritis    OSTEOPOROSIS 07/02/2009   Wrist fracture, left    Past Surgical History:  Procedure Laterality Date   BREAST SURGERY     lumpectomy   EYE SURGERY Bilateral    cataracts   LUNG BIOPSY  08/31/11   RUL lung =  fibrosis& focal slight atypia   LUNG BIOPSY  05/22/2009   RUL lung=Adenocarcinoma   MASTECTOMY  1991   right   TONSILLECTOMY  1955    Allergies  Allergen Reactions   Crestor [Rosuvastatin Calcium]     cramping    Allergies as of 09/22/2023       Reactions   Crestor [rosuvastatin Calcium]    cramping         Medication List        Accurate as of September 22, 2023 10:11 AM. If you have any questions, ask your nurse or doctor.          acetaminophen 500 MG tablet Commonly known as: TYLENOL Take 500 mg by mouth every 6 (six) hours as needed for headache (pain).   albuterol 108 (90 Base) MCG/ACT inhaler Commonly known as: VENTOLIN HFA Inhale 2 puffs into the lungs every 6 (six) hours as needed for wheezing or shortness of breath.   budesonide 0.25 MG/2ML nebulizer solution Commonly known as: PULMICORT USE (0.25 MG TOTAL) BY NEBULIZATION TWICE A DAY.   CALCIUM 1000 + D PO Take 2 tablets by mouth daily.   FERROUS SULFATE PO Take 1 tablet by mouth every morning.   hydrOXYzine 25 MG capsule Commonly known as: VISTARIL Take 1 capsule (25 mg total) by mouth at bedtime. May also take 1 capsule (25 mg total) daily as needed.   levothyroxine 50 MCG tablet Commonly known as: SYNTHROID TAKE 1 TABLET BY MOUTH EVERY DAY BEFORE BREAKFAST   losartan 100 MG tablet Commonly known as: COZAAR Take 1 tablet (100 mg total) by mouth daily.   metFORMIN 500 MG 24 hr tablet Commonly known as: GLUCOPHAGE-XR Take 1 tablet (500 mg total) by mouth daily.   metoprolol succinate 25 MG 24 hr tablet Commonly known as: TOPROL-XL Take 1 tablet (25 mg total) by mouth daily.   NIFEdipine 90 MG 24 hr tablet Commonly known as: PROCARDIA XL/NIFEDICAL-XL Take 1 tablet (90 mg total) by mouth daily.   pravastatin 40 MG tablet Commonly known as: PRAVACHOL Place 1 tablet (40 mg total) into feeding tube daily at 6 PM.   PreserVision AREDS 2+Multi Vit Caps Take 1 capsule by mouth every morning.   PRO-STAT PO Take 30 mLs by mouth daily.   senna-docusate 8.6-50 MG tablet Commonly known as: Senokot-S Take 1 tablet by mouth as needed for mild constipation.   Tiotropium Bromide-Olodaterol 2.5-2.5 MCG/ACT Aers Inhale 2 puffs into the lungs every morning.   vitamin B-12 100 MCG tablet Commonly known  as: CYANOCOBALAMIN Take 100 mcg by mouth daily.   zinc oxide 20 % ointment Apply 1 Application topically as needed for irritation.        Review of Systems  Constitutional:  Negative for activity change and appetite change.  HENT:  Negative for sore throat and trouble swallowing.   Eyes:  Negative for visual disturbance.  Respiratory:  Positive for shortness of breath and wheezing. Negative for cough.   Cardiovascular:  Negative for chest pain and leg swelling.  Gastrointestinal:  Negative for abdominal distention and abdominal pain.  Genitourinary:  Negative for dysuria and frequency.  Musculoskeletal:  Positive for gait problem.  Skin:  Positive for wound.  Neurological:  Positive for weakness. Negative for dizziness and light-headedness.  Psychiatric/Behavioral:  Positive for sleep disturbance. Negative for confusion and dysphoric mood. The patient is nervous/anxious.     Immunization History  Administered Date(s) Administered   Fluad Quad(high Dose 65+) 05/26/2019, 05/14/2022  Fluad Trivalent(High Dose 65+) 07/15/2023   Influenza Whole 06/03/2009, 05/03/2010   Influenza-Unspecified 04/04/2011, 05/03/2013, 05/03/2014, 04/19/2017, 04/17/2018   PFIZER(Purple Top)SARS-COV-2 Vaccination 08/26/2019, 09/16/2019, 08/01/2020, 03/02/2021   Pneumococcal Conjugate-13 08/16/2014   Pneumococcal Polysaccharide-23 06/04/1997, 07/03/2010   Td 01/01/1998   Tdap 11/15/2014   Zoster, Live 02/13/2014   Pertinent  Health Maintenance Due  Topic Date Due   OPHTHALMOLOGY EXAM  03/19/2022   HEMOGLOBIN A1C  12/28/2023   FOOT EXAM  07/29/2024   INFLUENZA VACCINE  Completed   DEXA SCAN  Completed   Colonoscopy  Discontinued      04/14/2022    1:34 PM 05/28/2023    1:31 PM 07/19/2023    4:42 PM 07/26/2023   11:20 AM 07/30/2023   11:09 AM  Fall Risk  Falls in the past year? 0 0 1 1 1   Was there an injury with Fall? 0 0 0 0 1  Fall Risk Category Calculator 0 0 1 1 2   Fall Risk Category  (Retired) Low      (RETIRED) Patient Fall Risk Level Low fall risk      Patient at Risk for Falls Due to Medication side effect Impaired mobility History of fall(s);Impaired balance/gait;Impaired mobility History of fall(s);Impaired balance/gait;Impaired mobility History of fall(s);Impaired balance/gait;Impaired mobility  Fall risk Follow up Falls prevention discussed;Education provided;Falls evaluation completed Falls evaluation completed Falls evaluation completed;Education provided;Falls prevention discussed Falls evaluation completed;Education provided;Falls prevention discussed Falls evaluation completed;Education provided;Falls prevention discussed   Functional Status Survey:    Vitals:   09/22/23 1003  BP: 118/71  Pulse: 81  Resp: 18  Temp: (!) 97 F (36.1 C)  SpO2: 99%  Weight: 98 lb 6.4 oz (44.6 kg)  Height: 5\' 2"  (1.575 m)   Body mass index is 18 kg/m. Physical Exam Vitals reviewed.  Constitutional:      General: She is not in acute distress. HENT:     Head: Normocephalic.     Right Ear: There is no impacted cerumen.     Left Ear: There is no impacted cerumen.     Nose: Nose normal.     Mouth/Throat:     Mouth: Mucous membranes are moist.  Eyes:     General:        Right eye: No discharge.        Left eye: No discharge.  Cardiovascular:     Rate and Rhythm: Normal rate and regular rhythm.     Pulses: Normal pulses.     Heart sounds: Normal heart sounds.  Pulmonary:     Effort: Pulmonary effort is normal. No respiratory distress.     Breath sounds: Normal breath sounds. No wheezing or rales.     Comments: 2 liters nasal cannula Abdominal:     General: Bowel sounds are normal.     Palpations: Abdomen is soft.  Musculoskeletal:     Cervical back: Neck supple.     Right lower leg: No edema.     Left lower leg: No edema.  Skin:    General: Skin is warm.     Capillary Refill: Capillary refill takes less than 2 seconds.     Comments: Approx 1 cm round scab to  left shin, no sign of infection  Neurological:     General: No focal deficit present.     Mental Status: She is alert. Mental status is at baseline.     Motor: Weakness present.     Gait: Gait abnormal.     Comments: Left sided  weakness, clear speech  Psychiatric:        Mood and Affect: Mood normal.     Labs reviewed: Recent Labs    07/10/23 0507 07/10/23 1705 07/11/23 0447 07/12/23 0421 07/13/23 0447 07/14/23 0539 07/22/23 0000  NA 140  --  141 143 141 139 139  K 3.5  --  3.8 3.8 4.0 3.8 4.4  CL 92*  --  94* 96* 96* 98 92*  CO2 37*  --  37* 39* 32 36* 41*  GLUCOSE 153*  --  180* 119* 171* 138*  --   BUN 27*  --  31* 34* 35* 24* 20  CREATININE 0.64  --  0.72 0.64 0.64 0.84 0.6  CALCIUM 10.0  --  9.3 9.2 9.3 9.2 10.0  MG 2.1 2.2 2.2  --   --   --   --   PHOS 2.7 3.2 2.5  --   --   --   --    Recent Labs    06/30/23 1107 07/08/23 1518  AST 18 41  ALT 8 16  ALKPHOS 47 75  BILITOT 0.5 0.7  PROT 7.0 6.9  ALBUMIN 4.5 3.5   Recent Labs    06/30/23 1107 07/08/23 1518 07/08/23 1538 07/14/23 0539 07/15/23 0650 07/16/23 0626 07/22/23 0000 08/26/23 0000  WBC 7.4 12.2*   < > 12.9* 13.4* 14.1* 11.0 7.3  NEUTROABS 5.1 10.9*  --   --   --   --   --  4,971.00  HGB 9.6* 9.7*   < > 8.5* 8.5* 8.3* 8.5* 8.6*  HCT 30.0* 32.7*   < > 28.1* 28.1* 27.5* 26* 27*  MCV 98.7 100.6*   < > 100.4* 100.4* 101.1*  --   --   PLT 184.0 262   < > 315 368 336 410* 241   < > = values in this interval not displayed.   Lab Results  Component Value Date   TSH 1.040 07/08/2023   Lab Results  Component Value Date   HGBA1C 5.6 06/30/2023   Lab Results  Component Value Date   CHOL 189 06/30/2023   HDL 86.40 06/30/2023   LDLCALC 88 06/30/2023   LDLDIRECT 155.6 07/11/2013   TRIG 75.0 06/30/2023   CHOLHDL 2 06/30/2023    Significant Diagnostic Results in last 30 days:  No results found.  Assessment/Plan 1. Anxiety (Primary) - 12/23 hydroxyzine prn started - now using hydroxyzine  daily - she agrees to try Zoloft today - recheck bmp in 2 weeks - cont hydroxyzine prn - sertraline (ZOLOFT) 25 MG tablet; Take 1 tablet (25 mg total) by mouth daily.  2. 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 Essential (primary) hypertension - see above - cont losartan, nifedipine, and metorpolol   4. Type II diabetes mellitus with neurological manifestations (HCC) - A1c 5.6 06/30/2023 - cont metformin  5. Iron deficiency anemia, unspecified iron deficiency anemia type - hgb 8.5 - cont ferrous sulfate  6. Esophageal dysphagia - no aspirations - cont regular diet/thin fluids  7. Stage 4 very severe COPD by GOLD classification (HCC) - no exacerbations - remains on 2L [place] - cont Pulmicort, stiolto and albuterol prn  8. Hypothyroidism following radioiodine therapy - cont levothyroxine  9. History of lung cancer -  completed radiation RUL 2011   10. History of breast cancer - s/p right mastectomy 1991     Family/ staff Communication: plan discussed with patient and nurse  Labs/tests ordered:  bmp in 2 weeks

## 2023-09-27 DIAGNOSIS — I69854 Hemiplegia and hemiparesis following other cerebrovascular disease affecting left non-dominant side: Secondary | ICD-10-CM | POA: Diagnosis not present

## 2023-09-27 DIAGNOSIS — R279 Unspecified lack of coordination: Secondary | ICD-10-CM | POA: Diagnosis not present

## 2023-09-27 DIAGNOSIS — M6281 Muscle weakness (generalized): Secondary | ICD-10-CM | POA: Diagnosis not present

## 2023-09-27 DIAGNOSIS — R29898 Other symptoms and signs involving the musculoskeletal system: Secondary | ICD-10-CM | POA: Diagnosis not present

## 2023-09-29 ENCOUNTER — Encounter: Payer: Self-pay | Admitting: Orthopedic Surgery

## 2023-09-29 ENCOUNTER — Non-Acute Institutional Stay (SKILLED_NURSING_FACILITY): Payer: Medicare Other | Admitting: Orthopedic Surgery

## 2023-09-29 DIAGNOSIS — R000 Tachycardia, unspecified: Secondary | ICD-10-CM

## 2023-09-29 DIAGNOSIS — M6281 Muscle weakness (generalized): Secondary | ICD-10-CM | POA: Diagnosis not present

## 2023-09-29 DIAGNOSIS — R279 Unspecified lack of coordination: Secondary | ICD-10-CM | POA: Diagnosis not present

## 2023-09-29 DIAGNOSIS — I69854 Hemiplegia and hemiparesis following other cerebrovascular disease affecting left non-dominant side: Secondary | ICD-10-CM | POA: Diagnosis not present

## 2023-09-29 DIAGNOSIS — R29898 Other symptoms and signs involving the musculoskeletal system: Secondary | ICD-10-CM | POA: Diagnosis not present

## 2023-09-29 DIAGNOSIS — U071 COVID-19: Secondary | ICD-10-CM | POA: Diagnosis not present

## 2023-09-29 MED ORDER — VITAMIN C 1000 MG PO TABS: 1000.0000 mg | ORAL_TABLET | Freq: Every day | ORAL | 0 refills | Status: DC

## 2023-09-29 MED ORDER — NIRMATRELVIR/RITONAVIR (PAXLOVID)TABLET: 3.0000 | ORAL_TABLET | Freq: Two times a day (BID) | ORAL | 0 refills | Status: DC

## 2023-09-29 MED ORDER — ZINC GLUCONATE 50 MG PO TABS: 50.0000 mg | ORAL_TABLET | Freq: Every day | ORAL | 0 refills | Status: DC

## 2023-09-29 NOTE — Progress Notes (Signed)
 Location:  [facility] Room Number: 16/A Place of Service:  SNF [phone]) Provider:  Octavia Heir, NP   Mahlon Gammon, MD  Patient Care Team: Mahlon Gammon, MD as PCP - General (Internal Medicine) Si Gaul, MD as Consulting Physician (Oncology) Dorothy Puffer, MD as Consulting Physician (Radiation Oncology) Meryl Dare, MD (Inactive) as Consulting Physician (Gastroenterology) Glenford Peers, OD as Referring Physician (Optometry)  Extended Emergency Contact Information Primary Emergency Contact: Jablonsky,Celie Mobile Phone: [phone] Relation: Daughter Secondary Emergency Contact: Shreve,Richard Address: [address]          [place], [place] [address] Darden [place] of [place] Phone: [phone] Relation: Son  Code Status:  DNR Goals of care: Advanced Directive information    09/22/2023   10:11 AM  Advanced Directives  Does Patient Have a Medical Advance Directive? Yes  Type of Estate agent of [place];Out of facility DNR (pink MOST or yellow form)  Does patient want to make changes to medical advance directive? No - Patient declined  Copy of Healthcare Power of Attorney in Chart? Yes - validated most recent copy scanned in chart (See row information)     Chief Complaint  Patient presents with   Acute Visit    covid    HPI:  Pt is a 89 y.o. female seen today for acute visit due to covid.   She currently resides on the skilled nursing unit at [facility]. PMH: HTN,HLD, lung cancer s/p radiation RUL (2011), breast cancer s/p right mastectomy> no chemo/radiation (1991), COPD, hypothyroidism post radiation, OA, osteoporosis, and recent unstable gait.   She reports not feeling well x 2 days. Symptoms include nasal congestion, hot flashes and sore throat. Covid test positive this afternoon. H/o COPD she remains on 3 liters oxygen. Denies chest pain or shortness of breath. Treatment options discussed with  patient and daughter. They agree to Paxlovid due to chronic lung condition. Recent GFR, 86.    Past Medical History:  Diagnosis Date   ADENOCARCINOMA, LUNG 07/03/2010   RUL   ASYMPTOMATIC POSTMENOPAUSAL STATUS 07/02/2009   Breast cancer (HCC) 1991   R mastectomy, no chemo or radiation   COLONIC POLYPS, HX OF 04/16/2007   COPD 05/17/2008   HEMATURIA, HX OF 04/16/2007   History of radiation therapy 07/30/09 to 08/09/09   RUL lung   HYPERLIPIDEMIA 04/16/2007   Hypertension    HYPERTENSION 04/16/2007   HYPOTHYROIDISM, POST-RADIATION 06/07/2008   Osteoarthritis    OSTEOPOROSIS 07/02/2009   Wrist fracture, left    Past Surgical History:  Procedure Laterality Date   BREAST SURGERY     lumpectomy   EYE SURGERY Bilateral    cataracts   LUNG BIOPSY  08/31/11   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   LUNG BIOPSY  05/22/2009   RUL lung=Adenocarcinoma   MASTECTOMY  1991   right   TONSILLECTOMY  1955    Allergies  Allergen Reactions   Crestor [Rosuvastatin Calcium]     cramping    Outpatient Encounter Medications as of 09/29/2023  Medication Sig   acetaminophen (TYLENOL) 500 MG tablet Take 500 mg by mouth every 6 (six) hours as needed for headache (pain).    albuterol (VENTOLIN HFA) 108 (90 Base) MCG/ACT inhaler Inhale 2 puffs into the lungs every 6 (six) hours as needed for wheezing or shortness of breath.   Amino Acids-Protein Hydrolys (PRO-STAT PO) Take 30 mLs by mouth daily.   budesonide (PULMICORT) 0.25 MG/2ML nebulizer solution USE (  0.25 MG TOTAL) BY NEBULIZATION TWICE A DAY.   Calcium Carb-Cholecalciferol (CALCIUM 1000 + D PO) Take 2 tablets by mouth daily.   FERROUS SULFATE PO Take 1 tablet by mouth every morning.   hydrOXYzine (VISTARIL) 25 MG capsule Take 1 capsule (25 mg total) by mouth at bedtime. May also take 1 capsule (25 mg total) daily as needed.   levothyroxine (SYNTHROID) 50 MCG tablet TAKE 1 TABLET BY MOUTH EVERY DAY BEFORE BREAKFAST   losartan (COZAAR) 100 MG  tablet Take 1 tablet (100 mg total) by mouth daily.   metFORMIN (GLUCOPHAGE-XR) 500 MG 24 hr tablet Take 1 tablet (500 mg total) by mouth daily.   metoprolol succinate (TOPROL-XL) 25 MG 24 hr tablet Take 1 tablet (25 mg total) by mouth daily.   Multiple Vitamins-Minerals (PRESERVISION AREDS 2+MULTI VIT) CAPS Take 1 capsule by mouth every morning.   NIFEdipine (PROCARDIA XL/NIFEDICAL-XL) 90 MG 24 hr tablet Take 1 tablet (90 mg total) by mouth daily.   pravastatin (PRAVACHOL) 40 MG tablet Place 1 tablet (40 mg total) into feeding tube daily at 6 PM.   senna-docusate (SENOKOT-S) 8.6-50 MG tablet Take 1 tablet by mouth as needed for mild constipation.   sertraline (ZOLOFT) 25 MG tablet Take 1 tablet (25 mg total) by mouth daily.   Tiotropium Bromide-Olodaterol 2.5-2.5 MCG/ACT AERS Inhale 2 puffs into the lungs every morning.   vitamin B-12 (CYANOCOBALAMIN) 100 MCG tablet Take 100 mcg by mouth daily.   zinc oxide 20 % ointment Apply 1 Application topically as needed for irritation.   No facility-administered encounter medications on file as of 09/29/2023.    Review of Systems  Constitutional:  Positive for activity change and fever.  HENT:  Positive for congestion and sore throat.   Respiratory:  Positive for wheezing. Negative for shortness of breath.   Cardiovascular:  Negative for chest pain.  Gastrointestinal:  Negative for nausea and vomiting.  Musculoskeletal:  Positive for gait problem.  Neurological:  Positive for weakness. Negative for dizziness and light-headedness.  Psychiatric/Behavioral:  Negative for dysphoric mood. The patient is not nervous/anxious.     Immunization History  Administered Date(s) Administered   Fluad Quad(high Dose 65+) 05/26/2019, 05/14/2022   Fluad Trivalent(High Dose 65+) 07/15/2023   Influenza Whole 06/03/2009, 05/03/2010   Influenza-Unspecified 04/04/2011, 05/03/2013, 05/03/2014, 04/19/2017, 04/17/2018   PFIZER(Purple Top)SARS-COV-2 Vaccination  08/26/2019, 09/16/2019, 08/01/2020, 03/02/2021   Pneumococcal Conjugate-13 08/16/2014   Pneumococcal Polysaccharide-23 06/04/1997, 07/03/2010   Td 01/01/1998   Tdap 11/15/2014   Zoster, Live 02/13/2014   Pertinent  Health Maintenance Due  Topic Date Due   OPHTHALMOLOGY EXAM  03/19/2022   HEMOGLOBIN A1C  12/28/2023   FOOT EXAM  07/29/2024   INFLUENZA VACCINE  Completed   DEXA SCAN  Completed   Colonoscopy  Discontinued      04/14/2022    1:34 PM 05/28/2023    1:31 PM 07/19/2023    4:42 PM 07/26/2023   11:20 AM 07/30/2023   11:09 AM  Fall Risk  Falls in the past year? 0 0 1 1 1   Was there an injury with Fall? 0 0 0 0 1  Fall Risk Category Calculator 0 0 1 1 2   Fall Risk Category (Retired) Low      (RETIRED) Patient Fall Risk Level Low fall risk      Patient at Risk for Falls Due to Medication side effect Impaired mobility History of fall(s);Impaired balance/gait;Impaired mobility History of fall(s);Impaired balance/gait;Impaired mobility History of fall(s);Impaired balance/gait;Impaired mobility  Fall risk  Follow up Falls prevention discussed;Education provided;Falls evaluation completed Falls evaluation completed Falls evaluation completed;Education provided;Falls prevention discussed Falls evaluation completed;Education provided;Falls prevention discussed Falls evaluation completed;Education provided;Falls prevention discussed   Functional Status Survey:    Vitals:   09/29/23 1439  BP: (!) 150/87  Pulse: (!) 116  Resp: (!) 22  Temp: 97.6 F (36.4 C)  SpO2: 97%  Weight: 98 lb 6.4 oz (44.6 kg)  Height: 5\' 2"  (1.575 m)   Body mass index is 18 kg/m. Physical Exam Vitals reviewed.  Constitutional:      General: She is not in acute distress. HENT:     Head: Normocephalic.     Right Ear: Tympanic membrane normal.     Left Ear: Tympanic membrane normal.     Nose: Rhinorrhea present.     Mouth/Throat:     Mouth: Mucous membranes are moist.     Pharynx: No posterior  oropharyngeal erythema.  Eyes:     General:        Right eye: No discharge.        Left eye: No discharge.  Cardiovascular:     Rate and Rhythm: Tachycardia present.     Pulses: Normal pulses.     Heart sounds: Normal heart sounds.  Pulmonary:     Effort: Pulmonary effort is normal.     Breath sounds: Wheezing present. No rhonchi or rales.  Abdominal:     General: Bowel sounds are normal.     Palpations: Abdomen is soft.  Musculoskeletal:     Cervical back: Neck supple.     Right lower leg: No edema.     Left lower leg: No edema.  Lymphadenopathy:     Cervical: No cervical adenopathy.  Skin:    General: Skin is warm.     Capillary Refill: Capillary refill takes less than 2 seconds.  Neurological:     General: No focal deficit present.     Mental Status: She is alert and oriented to person, place, and time.     Gait: Gait abnormal.     Comments: Left sided weakness  Psychiatric:        Mood and Affect: Mood normal.     Labs reviewed: Recent Labs    07/10/23 0507 07/10/23 1705 07/11/23 0447 07/12/23 0421 07/13/23 0447 07/14/23 0539 07/22/23 0000  NA 140  --  141 143 141 139 139  K 3.5  --  3.8 3.8 4.0 3.8 4.4  CL 92*  --  94* 96* 96* 98 92*  CO2 37*  --  37* 39* 32 36* 41*  GLUCOSE 153*  --  180* 119* 171* 138*  --   BUN 27*  --  31* 34* 35* 24* 20  CREATININE 0.64  --  0.72 0.64 0.64 0.84 0.6  CALCIUM 10.0  --  9.3 9.2 9.3 9.2 10.0  MG 2.1 2.2 2.2  --   --   --   --   PHOS 2.7 3.2 2.5  --   --   --   --    Recent Labs    06/30/23 1107 07/08/23 1518  AST 18 41  ALT 8 16  ALKPHOS 47 75  BILITOT 0.5 0.7  PROT 7.0 6.9  ALBUMIN 4.5 3.5   Recent Labs    06/30/23 1107 07/08/23 1518 07/08/23 1538 07/14/23 0539 07/15/23 0650 07/16/23 0626 07/22/23 0000 08/26/23 0000  WBC 7.4 12.2*   < > 12.9* 13.4* 14.1* 11.0 7.3  NEUTROABS 5.1 10.9*  --   --   --   --   --  4,971.00  HGB 9.6* 9.7*   < > 8.5* 8.5* 8.3* 8.5* 8.6*  HCT 30.0* 32.7*   < > 28.1* 28.1*  27.5* 26* 27*  MCV 98.7 100.6*   < > 100.4* 100.4* 101.1*  --   --   PLT 184.0 262   < > 315 368 336 410* 241   < > = values in this interval not displayed.   Lab Results  Component Value Date   TSH 1.040 07/08/2023   Lab Results  Component Value Date   HGBA1C 5.6 06/30/2023   Lab Results  Component Value Date   CHOL 189 06/30/2023   HDL 86.40 06/30/2023   LDLCALC 88 06/30/2023   LDLDIRECT 155.6 07/11/2013   TRIG 75.0 06/30/2023   CHOLHDL 2 06/30/2023    Significant Diagnostic Results in last 30 days:  No results found.  Assessment/Plan 1. COVID-19 (Primary) - Covid test + today - symptoms: nasal congestion, sore throat, malaise, hot flashes - will start paxlovid due to advanced COPD - hold pravastatin while on Paxlovid - start vitamin C 1000 mg and Zinc 50 mg x 7 days - nirmatrelvir/ritonavir (PAXLOVID) 20 x 150 MG & 10 x 100MG  TABS; Take 3 tablets by mouth 2 (two) times daily for 5 days. (Take nirmatrelvir 150 mg two tablets twice daily for 5 days and ritonavir 100 mg one tablet twice daily for 5 days) Patient GFR is 86  Dispense: 10 tablet; Refill: 0 - Ascorbic Acid (VITAMIN C) 1000 MG tablet; Take 1 tablet (1,000 mg total) by mouth daily for 7 days.  Dispense: 7 tablet; Refill: 0 - zinc gluconate 50 MG tablet; Take 1 tablet (50 mg total) by mouth daily for 7 days.  Dispense: 7 tablet; Refill: 0  2. Tachycardia - HR 116 - suspect due to fever - on metoprolol - start tylenol 1000 mg po TID PRN- give 1st dose now - encourage oral hydration     Family/ staff Communication: plan discussed with patient and nurse  Labs/tests ordered:  none

## 2023-09-30 ENCOUNTER — Encounter (HOSPITAL_COMMUNITY): Payer: Self-pay | Admitting: Emergency Medicine

## 2023-09-30 ENCOUNTER — Inpatient Hospital Stay (HOSPITAL_COMMUNITY)
Admission: EM | Admit: 2023-09-30 | Discharge: 2023-11-02 | DRG: 871 | Disposition: E | Payer: Medicare Other | Attending: Pulmonary Disease | Admitting: Pulmonary Disease

## 2023-09-30 ENCOUNTER — Emergency Department (HOSPITAL_COMMUNITY): Payer: Medicare Other

## 2023-09-30 DIAGNOSIS — N170 Acute kidney failure with tubular necrosis: Secondary | ICD-10-CM | POA: Diagnosis present

## 2023-09-30 DIAGNOSIS — Z85118 Personal history of other malignant neoplasm of bronchus and lung: Secondary | ICD-10-CM

## 2023-09-30 DIAGNOSIS — A419 Sepsis, unspecified organism: Secondary | ICD-10-CM | POA: Diagnosis not present

## 2023-09-30 DIAGNOSIS — Z9011 Acquired absence of right breast and nipple: Secondary | ICD-10-CM | POA: Diagnosis not present

## 2023-09-30 DIAGNOSIS — U071 COVID-19: Secondary | ICD-10-CM | POA: Diagnosis present

## 2023-09-30 DIAGNOSIS — Z888 Allergy status to other drugs, medicaments and biological substances status: Secondary | ICD-10-CM

## 2023-09-30 DIAGNOSIS — Z87891 Personal history of nicotine dependence: Secondary | ICD-10-CM | POA: Diagnosis not present

## 2023-09-30 DIAGNOSIS — I69854 Hemiplegia and hemiparesis following other cerebrovascular disease affecting left non-dominant side: Secondary | ICD-10-CM | POA: Diagnosis not present

## 2023-09-30 DIAGNOSIS — E119 Type 2 diabetes mellitus without complications: Secondary | ICD-10-CM | POA: Diagnosis present

## 2023-09-30 DIAGNOSIS — R0902 Hypoxemia: Secondary | ICD-10-CM | POA: Diagnosis not present

## 2023-09-30 DIAGNOSIS — I10 Essential (primary) hypertension: Secondary | ICD-10-CM | POA: Diagnosis present

## 2023-09-30 DIAGNOSIS — F419 Anxiety disorder, unspecified: Secondary | ICD-10-CM | POA: Diagnosis not present

## 2023-09-30 DIAGNOSIS — M810 Age-related osteoporosis without current pathological fracture: Secondary | ICD-10-CM | POA: Diagnosis not present

## 2023-09-30 DIAGNOSIS — J449 Chronic obstructive pulmonary disease, unspecified: Secondary | ICD-10-CM | POA: Diagnosis not present

## 2023-09-30 DIAGNOSIS — E039 Hypothyroidism, unspecified: Secondary | ICD-10-CM | POA: Diagnosis present

## 2023-09-30 DIAGNOSIS — J9621 Acute and chronic respiratory failure with hypoxia: Secondary | ICD-10-CM

## 2023-09-30 DIAGNOSIS — Z8673 Personal history of transient ischemic attack (TIA), and cerebral infarction without residual deficits: Secondary | ICD-10-CM

## 2023-09-30 DIAGNOSIS — N179 Acute kidney failure, unspecified: Secondary | ICD-10-CM | POA: Diagnosis not present

## 2023-09-30 DIAGNOSIS — I700 Atherosclerosis of aorta: Secondary | ICD-10-CM | POA: Diagnosis not present

## 2023-09-30 DIAGNOSIS — Z8249 Family history of ischemic heart disease and other diseases of the circulatory system: Secondary | ICD-10-CM | POA: Diagnosis not present

## 2023-09-30 DIAGNOSIS — E869 Volume depletion, unspecified: Secondary | ICD-10-CM | POA: Diagnosis present

## 2023-09-30 DIAGNOSIS — J189 Pneumonia, unspecified organism: Secondary | ICD-10-CM

## 2023-09-30 DIAGNOSIS — J9602 Acute respiratory failure with hypercapnia: Secondary | ICD-10-CM | POA: Diagnosis present

## 2023-09-30 DIAGNOSIS — J440 Chronic obstructive pulmonary disease with acute lower respiratory infection: Secondary | ICD-10-CM | POA: Diagnosis not present

## 2023-09-30 DIAGNOSIS — J9601 Acute respiratory failure with hypoxia: Principal | ICD-10-CM | POA: Diagnosis present

## 2023-09-30 DIAGNOSIS — Z66 Do not resuscitate: Secondary | ICD-10-CM | POA: Diagnosis not present

## 2023-09-30 DIAGNOSIS — R279 Unspecified lack of coordination: Secondary | ICD-10-CM | POA: Diagnosis not present

## 2023-09-30 DIAGNOSIS — Z923 Personal history of irradiation: Secondary | ICD-10-CM

## 2023-09-30 DIAGNOSIS — Z515 Encounter for palliative care: Secondary | ICD-10-CM

## 2023-09-30 DIAGNOSIS — E118 Type 2 diabetes mellitus with unspecified complications: Secondary | ICD-10-CM

## 2023-09-30 DIAGNOSIS — R0603 Acute respiratory distress: Secondary | ICD-10-CM

## 2023-09-30 DIAGNOSIS — J441 Chronic obstructive pulmonary disease with (acute) exacerbation: Secondary | ICD-10-CM | POA: Diagnosis not present

## 2023-09-30 DIAGNOSIS — Z860100 Personal history of colon polyps, unspecified: Secondary | ICD-10-CM

## 2023-09-30 DIAGNOSIS — E782 Mixed hyperlipidemia: Secondary | ICD-10-CM | POA: Diagnosis not present

## 2023-09-30 DIAGNOSIS — R6521 Severe sepsis with septic shock: Secondary | ICD-10-CM | POA: Diagnosis present

## 2023-09-30 DIAGNOSIS — J1282 Pneumonia due to coronavirus disease 2019: Secondary | ICD-10-CM | POA: Diagnosis not present

## 2023-09-30 DIAGNOSIS — R29898 Other symptoms and signs involving the musculoskeletal system: Secondary | ICD-10-CM | POA: Diagnosis not present

## 2023-09-30 DIAGNOSIS — Z79899 Other long term (current) drug therapy: Secondary | ICD-10-CM

## 2023-09-30 DIAGNOSIS — I499 Cardiac arrhythmia, unspecified: Secondary | ICD-10-CM | POA: Diagnosis not present

## 2023-09-30 DIAGNOSIS — E785 Hyperlipidemia, unspecified: Secondary | ICD-10-CM | POA: Diagnosis not present

## 2023-09-30 DIAGNOSIS — Z9981 Dependence on supplemental oxygen: Secondary | ICD-10-CM

## 2023-09-30 DIAGNOSIS — E890 Postprocedural hypothyroidism: Secondary | ICD-10-CM

## 2023-09-30 DIAGNOSIS — Z853 Personal history of malignant neoplasm of breast: Secondary | ICD-10-CM

## 2023-09-30 DIAGNOSIS — R918 Other nonspecific abnormal finding of lung field: Secondary | ICD-10-CM | POA: Diagnosis not present

## 2023-09-30 DIAGNOSIS — A4189 Other specified sepsis: Principal | ICD-10-CM | POA: Diagnosis present

## 2023-09-30 DIAGNOSIS — R54 Age-related physical debility: Secondary | ICD-10-CM | POA: Diagnosis present

## 2023-09-30 DIAGNOSIS — M6281 Muscle weakness (generalized): Secondary | ICD-10-CM | POA: Diagnosis not present

## 2023-09-30 DIAGNOSIS — Z79890 Hormone replacement therapy (postmenopausal): Secondary | ICD-10-CM

## 2023-09-30 LAB — CBC WITH DIFFERENTIAL/PLATELET
Abs Immature Granulocytes: 0 10*3/uL (ref 0.00–0.07)
Basophils Absolute: 0 10*3/uL (ref 0.0–0.1)
Basophils Relative: 0 %
Eosinophils Absolute: 0.4 10*3/uL (ref 0.0–0.5)
Eosinophils Relative: 3 %
HCT: 36.2 % (ref 36.0–46.0)
Hemoglobin: 11.2 g/dL — ABNORMAL LOW (ref 12.0–15.0)
Lymphocytes Relative: 5 %
Lymphs Abs: 0.6 10*3/uL — ABNORMAL LOW (ref 0.7–4.0)
MCH: 30.4 pg (ref 26.0–34.0)
MCHC: 30.9 g/dL (ref 30.0–36.0)
MCV: 98.4 fL (ref 80.0–100.0)
Monocytes Absolute: 1.1 10*3/uL — ABNORMAL HIGH (ref 0.1–1.0)
Monocytes Relative: 9 %
Neutro Abs: 9.8 10*3/uL — ABNORMAL HIGH (ref 1.7–7.7)
Neutrophils Relative %: 83 %
Platelets: 222 10*3/uL (ref 150–400)
RBC: 3.68 MIL/uL — ABNORMAL LOW (ref 3.87–5.11)
RDW: 14.7 % (ref 11.5–15.5)
WBC: 11.8 10*3/uL — ABNORMAL HIGH (ref 4.0–10.5)
nRBC: 0 % (ref 0.0–0.2)
nRBC: 0 /100{WBCs}

## 2023-09-30 LAB — I-STAT VENOUS BLOOD GAS, ED
Acid-Base Excess: 7 mmol/L — ABNORMAL HIGH (ref 0.0–2.0)
Bicarbonate: 36.9 mmol/L — ABNORMAL HIGH (ref 20.0–28.0)
Calcium, Ion: 1.23 mmol/L (ref 1.15–1.40)
HCT: 36 % (ref 36.0–46.0)
Hemoglobin: 12.2 g/dL (ref 12.0–15.0)
O2 Saturation: 83 %
Potassium: 3.5 mmol/L (ref 3.5–5.1)
Sodium: 141 mmol/L (ref 135–145)
TCO2: 39 mmol/L — ABNORMAL HIGH (ref 22–32)
pCO2, Ven: 83.8 mm[Hg] (ref 44–60)
pH, Ven: 7.252 (ref 7.25–7.43)
pO2, Ven: 58 mm[Hg] — ABNORMAL HIGH (ref 32–45)

## 2023-09-30 LAB — BLOOD GAS, VENOUS
Acid-Base Excess: 0.1 mmol/L (ref 0.0–2.0)
Bicarbonate: 30.6 mmol/L — ABNORMAL HIGH (ref 20.0–28.0)
Drawn by: 2585
O2 Saturation: 81.1 %
Patient temperature: 37
pCO2, Ven: 80 mm[Hg] (ref 44–60)
pH, Ven: 7.19 — CL (ref 7.25–7.43)
pO2, Ven: 56 mm[Hg] — ABNORMAL HIGH (ref 32–45)

## 2023-09-30 LAB — COMPREHENSIVE METABOLIC PANEL
ALT: 20 U/L (ref 0–44)
AST: 49 U/L — ABNORMAL HIGH (ref 15–41)
Albumin: 3.5 g/dL (ref 3.5–5.0)
Alkaline Phosphatase: 49 U/L (ref 38–126)
Anion gap: 19 — ABNORMAL HIGH (ref 5–15)
BUN: 55 mg/dL — ABNORMAL HIGH (ref 8–23)
CO2: 30 mmol/L (ref 22–32)
Calcium: 10.1 mg/dL (ref 8.9–10.3)
Chloride: 92 mmol/L — ABNORMAL LOW (ref 98–111)
Creatinine, Ser: 1.99 mg/dL — ABNORMAL HIGH (ref 0.44–1.00)
GFR, Estimated: 24 mL/min — ABNORMAL LOW (ref >60)
Glucose, Bld: 264 mg/dL — ABNORMAL HIGH (ref 70–99)
Potassium: 3.5 mmol/L (ref 3.5–5.1)
Sodium: 141 mmol/L (ref 135–145)
Total Bilirubin: 0.6 mg/dL (ref 0.0–1.2)
Total Protein: 7.6 g/dL (ref 6.5–8.1)

## 2023-09-30 LAB — LACTIC ACID, PLASMA: Lactic Acid, Venous: 5.5 mmol/L (ref 0.5–1.9)

## 2023-09-30 LAB — RESP PANEL BY RT-PCR (RSV, FLU A&B, COVID)  RVPGX2
Influenza A by PCR: NEGATIVE
Influenza B by PCR: NEGATIVE
Resp Syncytial Virus by PCR: NEGATIVE
SARS Coronavirus 2 by RT PCR: POSITIVE — AB

## 2023-09-30 LAB — BRAIN NATRIURETIC PEPTIDE: B Natriuretic Peptide: 258.2 pg/mL — ABNORMAL HIGH (ref 0.0–100.0)

## 2023-09-30 MED ORDER — VANCOMYCIN HCL IN DEXTROSE 1-5 GM/200ML-% IV SOLN
1000.0000 mg | Freq: Once | INTRAVENOUS | Status: AC
  Administered 2023-09-30: 1000 mg via INTRAVENOUS
  Filled 2023-09-30: qty 200

## 2023-09-30 MED ORDER — SODIUM CHLORIDE 0.9 % IV SOLN
500.0000 mg | Freq: Once | INTRAVENOUS | Status: AC
  Administered 2023-09-30: 500 mg via INTRAVENOUS
  Filled 2023-09-30: qty 5

## 2023-09-30 MED ORDER — SODIUM CHLORIDE 0.9 % IV BOLUS
500.0000 mL | Freq: Once | INTRAVENOUS | Status: AC
  Administered 2023-09-30: 500 mL via INTRAVENOUS

## 2023-09-30 MED ORDER — NOREPINEPHRINE 4 MG/250ML-% IV SOLN
0.0000 ug/min | INTRAVENOUS | Status: DC
  Administered 2023-09-30: 2 ug/min via INTRAVENOUS
  Administered 2023-10-01: 10 ug/min via INTRAVENOUS
  Administered 2023-10-01: 12 ug/min via INTRAVENOUS
  Administered 2023-10-01: 10 ug/min via INTRAVENOUS
  Administered 2023-10-01: 13 ug/min via INTRAVENOUS
  Administered 2023-10-02 (×2): 14 ug/min via INTRAVENOUS
  Filled 2023-09-30 (×8): qty 250

## 2023-09-30 MED ORDER — SODIUM CHLORIDE 0.9 % IV SOLN
2.0000 g | Freq: Once | INTRAVENOUS | Status: AC
  Administered 2023-09-30: 2 g via INTRAVENOUS
  Filled 2023-09-30: qty 20

## 2023-09-30 MED ORDER — SODIUM CHLORIDE 0.9 % IV SOLN: 250.0000 mL | INTRAVENOUS | Status: AC

## 2023-09-30 MED ORDER — ALBUTEROL SULFATE (2.5 MG/3ML) 0.083% IN NEBU
10.0000 mg/h | INHALATION_SOLUTION | RESPIRATORY_TRACT | Status: AC
Start: 2023-09-30 — End: 2023-09-30
  Administered 2023-09-30: 10 mg/h via RESPIRATORY_TRACT
  Filled 2023-09-30: qty 12

## 2023-09-30 MED ORDER — SODIUM CHLORIDE 0.9 % IV SOLN
2.0000 g | Freq: Once | INTRAVENOUS | Status: AC
  Administered 2023-09-30: 2 g via INTRAVENOUS
  Filled 2023-09-30: qty 12.5

## 2023-09-30 MED ORDER — SODIUM CHLORIDE 0.9 % IV BOLUS
1500.0000 mL | Freq: Once | INTRAVENOUS | Status: AC
  Administered 2023-09-30: 1500 mL via INTRAVENOUS

## 2023-09-30 MED ORDER — METHYLPREDNISOLONE SODIUM SUCC 125 MG IJ SOLR
125.0000 mg | INTRAMUSCULAR | Status: AC
  Administered 2023-09-30: 125 mg via INTRAVENOUS
  Filled 2023-09-30: qty 2

## 2023-09-30 MED ORDER — LACTATED RINGERS IV BOLUS
500.0000 mL | Freq: Once | INTRAVENOUS | Status: AC
  Administered 2023-10-01: 500 mL via INTRAVENOUS

## 2023-09-30 NOTE — Sepsis Progress Note (Signed)
Notified provider of need to order lactic acid. ° °

## 2023-09-30 NOTE — Progress Notes (Signed)
 ED Pharmacy Antibiotic Sign Off An antibiotic consult was received from an ED provider for cefepime and vancomycin per pharmacy dosing for pneumonia. A chart review was completed to assess appropriateness.  Patient with AKI, Scr 1.99 (B < 1).  A single dose of  azithromycin 500mg  and ceftriaxone  2 g was placed by the ED provider.   The following one time order(s) were placed per pharmacy consult:  vancomycin 1000 mg x 1 dose cefepime 2 g x 1 dose  Further antibiotic and/or antibiotic pharmacy consults should be ordered by the admitting provider if indicated.   Thank you for allowing pharmacy to be a part of this patient's care.   Ernestene Kiel, PharmD PGY1 Pharmacy Resident  Please check AMION for all [facility] Pharmacy phone numbers After 10:00 PM, call Main Pharmacy [phone]

## 2023-09-30 NOTE — ED Provider Notes (Signed)
 [place] EMERGENCY DEPARTMENT AT [facility] Provider Note   CSN: [id number] Arrival date & time: 09/30/23  1932     History {Add pertinent medical, surgical, social history, OB history to HPI:1} Chief Complaint  Patient presents with  . Shortness of Breath    Charlotte Harris is a 89 y.o. female.  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  Patient is currently being treated by [facility] facility ([facility]) for COVID infection.  911 was called today after she was on 4 L nasal cannula at her facility satting 80%.  Was given epinephrine and multiple DuoNebs and rounds of albuterol.  Baseline AOX4.  Respiratory distress limits additional history.  Appears that she has been treated with Paxlovid.       Home Medications Prior to Admission medications   Medication Sig Start Date End Date Taking? Authorizing Provider  acetaminophen (TYLENOL) 500 MG tablet Take 500 mg by mouth every 6 (six) hours as needed for headache (pain).     [provider]  albuterol (VENTOLIN HFA) 108 (90 Base) MCG/ACT inhaler Inhale 2 puffs into the lungs every 6 (six) hours as needed for wheezing or shortness of breath. 09/18/22   Icard, Rachel Bo, DO  Amino Acids-Protein Hydrolys (PRO-STAT PO) Take 30 mLs by mouth daily.    [provider]  Ascorbic Acid (VITAMIN C) 1000 MG tablet Take 1 tablet (1,000 mg total) by mouth daily for 7 days. 09/29/23 10/06/23  Fargo, Amy E, NP  budesonide (PULMICORT) 0.25 MG/2ML nebulizer solution USE (0.25 MG TOTAL) BY NEBULIZATION TWICE A DAY. 02/28/21   Koberlein, Paris Lore, MD  Calcium Carb-Cholecalciferol (CALCIUM 1000 + D PO) Take 2 tablets by mouth daily.    [provider]  FERROUS SULFATE PO Take 1 tablet by mouth every morning.    [provider]  hydrOXYzine (VISTARIL) 25 MG capsule Take 1 capsule (25 mg total) by mouth at bedtime. May also take 1 capsule (25 mg total) daily as needed. 08/25/23   Octavia Heir, NP  levothyroxine (SYNTHROID) 50 MCG tablet TAKE 1 TABLET BY MOUTH EVERY DAY BEFORE BREAKFAST 05/28/23   Karie Georges, MD  losartan (COZAAR) 100 MG tablet Take 1 tablet (100 mg total) by mouth daily. 05/28/23   Karie Georges, MD  metFORMIN (GLUCOPHAGE-XR) 500 MG 24 hr tablet Take 1 tablet (500 mg total) by mouth daily. 05/28/23   Karie Georges, MD  metoprolol succinate (TOPROL-XL) 25 MG 24 hr tablet Take 1 tablet (25 mg total) by mouth daily. 05/28/23   Karie Georges, MD  Multiple Vitamins-Minerals (PRESERVISION AREDS 2+MULTI VIT) CAPS Take 1 capsule by mouth every morning.    [provider]  NIFEdipine (PROCARDIA XL/NIFEDICAL-XL) 90 MG 24 hr tablet Take 1 tablet (90 mg total) by mouth daily. 07/15/23   Lanae Boast, MD  nirmatrelvir/ritonavir (PAXLOVID) 20 x 150 MG & 10 x 100MG  TABS Take 3 tablets by mouth 2 (two) times daily for 5 days. (Take nirmatrelvir 150 mg two tablets twice daily for 5 days and ritonavir 100 mg one tablet twice daily for 5 days) Patient GFR is 86 09/29/23 10/04/23  Fargo, Amy E, NP  pravastatin (PRAVACHOL) 40 MG tablet Place 1 tablet (40 mg total) into feeding tube daily at 6 PM. 07/14/23   Kc,  Dayna Barker, MD  senna-docusate (SENOKOT-S) 8.6-50 MG tablet Take 1 tablet by mouth as needed for mild constipation.    [provider]  sertraline (ZOLOFT) 25 MG tablet Take 1 tablet (25 mg total) by mouth daily. 09/22/23   Fargo, Amy E, NP  Tiotropium Bromide-Olodaterol 2.5-2.5 MCG/ACT AERS Inhale 2 puffs into the lungs every morning.    [provider]  vitamin B-12 (CYANOCOBALAMIN) 100 MCG tablet Take 100 mcg by mouth daily.    [provider]  zinc gluconate 50 MG tablet Take 1 tablet (50 mg total) by mouth daily for 7 days. 09/29/23 10/06/23  Hazle Nordmann E, NP   zinc oxide 20 % ointment Apply 1 Application topically as needed for irritation.    [provider]      Allergies    Crestor [rosuvastatin calcium]    Review of Systems   Review of Systems  Physical Exam Updated Vital Signs BP (!) 89/73 (BP Location: Right Arm)   Pulse (!) 111   Temp (!) 101 F (38.3 C) (Axillary)   Resp (!) 30   Ht 5\' 2"  (1.575 m)   Wt 44.6 kg   SpO2 97%   BMI 17.98 kg/m  Physical Exam Constitutional:      General: She is in acute distress.     Appearance: She is ill-appearing.     Comments: DuoNeb going.  Placed on BiPAP.  HENT:     Head: Normocephalic.     Right Ear: External ear normal.     Left Ear: External ear normal.     Nose: Nose normal.  Eyes:     Conjunctiva/sclera: Conjunctivae normal.     Pupils: Pupils are equal, round, and reactive to light.  Cardiovascular:     Rate and Rhythm: Regular rhythm. Tachycardia present.     Pulses: Normal pulses.     Heart sounds: Normal heart sounds.  Pulmonary:     Effort: Respiratory distress present.     Comments: Diminished breath sounds bl Musculoskeletal:     Right lower leg: No edema.     Left lower leg: No edema.  Neurological:     Mental Status: She is alert and oriented to person, place, and time. Mental status is at baseline.    ED Results / Procedures / Treatments   Labs (all labs ordered are listed, but only abnormal results are displayed) Labs Reviewed  RESP PANEL BY RT-PCR (RSV, FLU A&B, COVID)  RVPGX2  CULTURE, BLOOD (ROUTINE X 2)  CULTURE, BLOOD (ROUTINE X 2)  COMPREHENSIVE METABOLIC PANEL  CBC WITH DIFFERENTIAL/PLATELET  BRAIN NATRIURETIC PEPTIDE  I-STAT VENOUS BLOOD GAS, ED    EKG None  Radiology No results found.  Procedures Procedures  {Document cardiac monitor, telemetry assessment procedure when appropriate:1}   EMERGENCY DEPARTMENT [place] CARDIAC EXAM "Study: Limited Ultrasound of the Heart and Pericardium"  INDICATIONS:Dyspnea Multiple views of the  heart and pericardium were obtained in real-time with a multi-frequency probe.  PERFORMED ZO:XWRUEA IMAGES ARCHIVED?: No LIMITATIONS:  None VIEWS USED: Subcostal 4 chamber, Parasternal long axis, Parasternal short axis, and Inferior Vena Cava INTERPRETATION: Cardiac activity present, Pericardial effusioin absent, Probable low CVP, Increased contractility, and IVC flat   Medications Ordered in ED Medications  cefTRIAXone (ROCEPHIN) 2 g in sodium chloride 0.9 % 100 mL IVPB (has no administration in time range)  azithromycin (ZITHROMAX) 500 mg in sodium chloride 0.9 % 250 mL IVPB (has no administration in time range)  methylPREDNISolone sodium succinate (SOLU-MEDROL) 125 mg/2 mL injection  125 mg (has no administration in time range)  albuterol (PROVENTIL,VENTOLIN) solution continuous neb (has no administration in time range)    ED Course/ Medical Decision Making/ A&P Clinical Course as of 10/01/23 1105  Thu Sep 30, 2023  2103 Creatinine(!): 1.99 Baseline 0.8 [RP]  2315 Dr Allena Katz from the ICU will evaluate for admission [RP]    Clinical Course User Index [RP] Rondel Baton, MD   {   Click here for ABCD2, HEART and other calculatorsREFRESH Note before signing :1}                              Medical Decision Making Amount and/or Complexity of Data Reviewed Labs: ordered. Decision-making details documented in ED Course. Radiology: ordered.  Risk Prescription drug management. Decision regarding hospitalization.   Charlotte Harris is a 89 y.o. female with comorbidities that complicate the patient evaluation including severe COPD on 2 L home oxygen, HTN, HLD, DM, right upper lobe adenocarcinoma status post radiation, and breast cancer status post right mastectomy who presents emergency department with shortness of breath.    Initial Ddx:  COPD exacerbation, COVID, bacterial PNA, sepsis, respiratory distress  MDM/Course:  *** Upon re-evaluation ***  This patient presents  to the ED for concern of complaints listed in HPI, this involves an extensive number of treatment options, and is a complaint that carries with it a high risk of complications and morbidity. Disposition including potential need for admission considered.   Dispo: {Disposition:28069}  Additional history obtained from {Additional History:28067} Records reviewed {Records Reviewed:28068} The following labs were independently interpreted: {labs interpreted:28064} and show {lab findings:28250} I independently reviewed the following imaging with scope of interpretation limited to determining acute life threatening conditions related to emergency care: {imaging interpreted:28065} and agree with the radiologist interpretation with the following exceptions: none I personally reviewed and interpreted cardiac monitoring: {cardiac monitoring:28251} I personally reviewed and interpreted the pt's EKG: see above for interpretation  I have reviewed the patients home medications and made adjustments as needed Consults: {Consultants:28063} Social Determinants of health:  ***  Portions of this note were generated with Scientist, clinical (histocompatibility and immunogenetics). Dictation errors may occur despite best attempts at proofreading.    {Document your decision making why or why not admission, treatments were needed:1} Final Clinical Impression(s) / ED Diagnoses Final diagnoses:  None    Rx / DC Orders ED Discharge Orders     None

## 2023-09-30 NOTE — ED Notes (Signed)
 Dr Jarold Motto made aware of pt ph 7.19 and co2 80

## 2023-09-30 NOTE — ED Notes (Signed)
Dr Patterson at bedside  

## 2023-09-30 NOTE — ED Triage Notes (Signed)
 Tested pos for COVID 3 days Sent from facility with SOB that started 2 hours ago  4lNC at facility 80% Albuterol x1 from facility 8L duo ned at this time per EMS. Duo neb x 2  Epi x1  Lethargic at this time, normally oriented x4  112ST 130/70 94% on duo neb 30 RR

## 2023-09-30 NOTE — Sepsis Progress Note (Signed)
 Elink monitoring for the code sepsis protocol.

## 2023-09-30 NOTE — ED Notes (Signed)
 Dr Jarold Motto made aware of lact acid 5.5 at this time

## 2023-10-01 ENCOUNTER — Inpatient Hospital Stay (HOSPITAL_COMMUNITY): Payer: Medicare Other

## 2023-10-01 DIAGNOSIS — J440 Chronic obstructive pulmonary disease with acute lower respiratory infection: Secondary | ICD-10-CM | POA: Diagnosis present

## 2023-10-01 DIAGNOSIS — J449 Chronic obstructive pulmonary disease, unspecified: Secondary | ICD-10-CM

## 2023-10-01 DIAGNOSIS — N179 Acute kidney failure, unspecified: Secondary | ICD-10-CM

## 2023-10-01 DIAGNOSIS — U071 COVID-19: Secondary | ICD-10-CM | POA: Diagnosis not present

## 2023-10-01 DIAGNOSIS — A419 Sepsis, unspecified organism: Secondary | ICD-10-CM | POA: Diagnosis not present

## 2023-10-01 DIAGNOSIS — F419 Anxiety disorder, unspecified: Secondary | ICD-10-CM | POA: Diagnosis present

## 2023-10-01 DIAGNOSIS — J9601 Acute respiratory failure with hypoxia: Secondary | ICD-10-CM | POA: Diagnosis not present

## 2023-10-01 DIAGNOSIS — R6521 Severe sepsis with septic shock: Secondary | ICD-10-CM | POA: Diagnosis not present

## 2023-10-01 DIAGNOSIS — Z79890 Hormone replacement therapy (postmenopausal): Secondary | ICD-10-CM | POA: Diagnosis not present

## 2023-10-01 DIAGNOSIS — J1282 Pneumonia due to coronavirus disease 2019: Secondary | ICD-10-CM | POA: Diagnosis present

## 2023-10-01 DIAGNOSIS — J9602 Acute respiratory failure with hypercapnia: Secondary | ICD-10-CM | POA: Diagnosis present

## 2023-10-01 DIAGNOSIS — E782 Mixed hyperlipidemia: Secondary | ICD-10-CM | POA: Diagnosis present

## 2023-10-01 DIAGNOSIS — N170 Acute kidney failure with tubular necrosis: Secondary | ICD-10-CM | POA: Diagnosis present

## 2023-10-01 DIAGNOSIS — E039 Hypothyroidism, unspecified: Secondary | ICD-10-CM | POA: Diagnosis not present

## 2023-10-01 DIAGNOSIS — R54 Age-related physical debility: Secondary | ICD-10-CM | POA: Diagnosis present

## 2023-10-01 DIAGNOSIS — Z515 Encounter for palliative care: Secondary | ICD-10-CM | POA: Diagnosis not present

## 2023-10-01 DIAGNOSIS — E119 Type 2 diabetes mellitus without complications: Secondary | ICD-10-CM

## 2023-10-01 DIAGNOSIS — E869 Volume depletion, unspecified: Secondary | ICD-10-CM | POA: Diagnosis present

## 2023-10-01 DIAGNOSIS — Z66 Do not resuscitate: Secondary | ICD-10-CM | POA: Diagnosis present

## 2023-10-01 DIAGNOSIS — I10 Essential (primary) hypertension: Secondary | ICD-10-CM

## 2023-10-01 DIAGNOSIS — E785 Hyperlipidemia, unspecified: Secondary | ICD-10-CM | POA: Diagnosis not present

## 2023-10-01 DIAGNOSIS — I700 Atherosclerosis of aorta: Secondary | ICD-10-CM | POA: Diagnosis not present

## 2023-10-01 DIAGNOSIS — R109 Unspecified abdominal pain: Secondary | ICD-10-CM | POA: Diagnosis not present

## 2023-10-01 DIAGNOSIS — J441 Chronic obstructive pulmonary disease with (acute) exacerbation: Secondary | ICD-10-CM | POA: Diagnosis present

## 2023-10-01 DIAGNOSIS — A4189 Other specified sepsis: Secondary | ICD-10-CM | POA: Diagnosis present

## 2023-10-01 DIAGNOSIS — Z923 Personal history of irradiation: Secondary | ICD-10-CM | POA: Diagnosis not present

## 2023-10-01 DIAGNOSIS — M810 Age-related osteoporosis without current pathological fracture: Secondary | ICD-10-CM | POA: Diagnosis present

## 2023-10-01 DIAGNOSIS — Z87891 Personal history of nicotine dependence: Secondary | ICD-10-CM | POA: Diagnosis not present

## 2023-10-01 DIAGNOSIS — Z8249 Family history of ischemic heart disease and other diseases of the circulatory system: Secondary | ICD-10-CM | POA: Diagnosis not present

## 2023-10-01 DIAGNOSIS — Z9011 Acquired absence of right breast and nipple: Secondary | ICD-10-CM | POA: Diagnosis not present

## 2023-10-01 LAB — MAGNESIUM
Magnesium: 1.7 mg/dL (ref 1.7–2.4)
Magnesium: 2.5 mg/dL — ABNORMAL HIGH (ref 1.7–2.4)

## 2023-10-01 LAB — PHOSPHORUS
Phosphorus: 3.3 mg/dL (ref 2.5–4.6)
Phosphorus: 3.6 mg/dL (ref 2.5–4.6)

## 2023-10-01 LAB — CBG MONITORING, ED
Glucose-Capillary: 323 mg/dL — ABNORMAL HIGH (ref 70–99)
Glucose-Capillary: 318 mg/dL — ABNORMAL HIGH (ref 70–99)
Glucose-Capillary: 257 mg/dL — ABNORMAL HIGH (ref 70–99)

## 2023-10-01 LAB — BASIC METABOLIC PANEL
Anion gap: 16 — ABNORMAL HIGH (ref 5–15)
Anion gap: 15 (ref 5–15)
BUN: 55 mg/dL — ABNORMAL HIGH (ref 8–23)
BUN: 59 mg/dL — ABNORMAL HIGH (ref 8–23)
CO2: 27 mmol/L (ref 22–32)
CO2: 28 mmol/L (ref 22–32)
Calcium: 8.5 mg/dL — ABNORMAL LOW (ref 8.9–10.3)
Calcium: 9.4 mg/dL (ref 8.9–10.3)
Chloride: 98 mmol/L (ref 98–111)
Chloride: 99 mmol/L (ref 98–111)
Creatinine, Ser: 1.69 mg/dL — ABNORMAL HIGH (ref 0.44–1.00)
Creatinine, Ser: 1.78 mg/dL — ABNORMAL HIGH (ref 0.44–1.00)
GFR, Estimated: 29 mL/min — ABNORMAL LOW (ref >60)
GFR, Estimated: 27 mL/min — ABNORMAL LOW (ref >60)
Glucose, Bld: 340 mg/dL — ABNORMAL HIGH (ref 70–99)
Glucose, Bld: 197 mg/dL — ABNORMAL HIGH (ref 70–99)
Potassium: 2.9 mmol/L — ABNORMAL LOW (ref 3.5–5.1)
Potassium: 3.8 mmol/L (ref 3.5–5.1)
Sodium: 141 mmol/L (ref 135–145)
Sodium: 142 mmol/L (ref 135–145)

## 2023-10-01 LAB — URINALYSIS, ROUTINE W REFLEX MICROSCOPIC
Bilirubin Urine: NEGATIVE
Glucose, UA: 150 mg/dL — AB
Hgb urine dipstick: NEGATIVE
Ketones, ur: NEGATIVE mg/dL
Leukocytes,Ua: NEGATIVE
Nitrite: NEGATIVE
Protein, ur: 100 mg/dL — AB
Specific Gravity, Urine: 1.018 (ref 1.005–1.030)
pH: 5 (ref 5.0–8.0)

## 2023-10-01 LAB — I-STAT VENOUS BLOOD GAS, ED
Acid-Base Excess: 3 mmol/L — ABNORMAL HIGH (ref 0.0–2.0)
Bicarbonate: 30 mmol/L — ABNORMAL HIGH (ref 20.0–28.0)
Calcium, Ion: 1.13 mmol/L — ABNORMAL LOW (ref 1.15–1.40)
HCT: 30 % — ABNORMAL LOW (ref 36.0–46.0)
Hemoglobin: 10.2 g/dL — ABNORMAL LOW (ref 12.0–15.0)
O2 Saturation: 83 %
Potassium: 2.9 mmol/L — ABNORMAL LOW (ref 3.5–5.1)
Sodium: 142 mmol/L (ref 135–145)
TCO2: 32 mmol/L (ref 22–32)
pCO2, Ven: 58 mm[Hg] (ref 44–60)
pH, Ven: 7.321 (ref 7.25–7.43)
pO2, Ven: 53 mm[Hg] — ABNORMAL HIGH (ref 32–45)

## 2023-10-01 LAB — BLOOD GAS, VENOUS
Acid-base deficit: 7.6 mmol/L — ABNORMAL HIGH (ref 0.0–2.0)
Bicarbonate: 24.9 mmol/L (ref 20.0–28.0)
O2 Saturation: 68.4 %
Patient temperature: 37.2
pCO2, Ven: 91 mm[Hg] (ref 44–60)
pH, Ven: 7.05 — CL (ref 7.25–7.43)
pO2, Ven: 46 mm[Hg] — ABNORMAL HIGH (ref 32–45)

## 2023-10-01 LAB — CBC
HCT: 30.5 % — ABNORMAL LOW (ref 36.0–46.0)
HCT: 30.6 % — ABNORMAL LOW (ref 36.0–46.0)
Hemoglobin: 9.4 g/dL — ABNORMAL LOW (ref 12.0–15.0)
Hemoglobin: 9.8 g/dL — ABNORMAL LOW (ref 12.0–15.0)
MCH: 30.4 pg (ref 26.0–34.0)
MCH: 31.1 pg (ref 26.0–34.0)
MCHC: 30.8 g/dL (ref 30.0–36.0)
MCHC: 32 g/dL (ref 30.0–36.0)
MCV: 98.7 fL (ref 80.0–100.0)
MCV: 97.1 fL (ref 80.0–100.0)
Platelets: 149 10*3/uL — ABNORMAL LOW (ref 150–400)
Platelets: 168 10*3/uL (ref 150–400)
RBC: 3.09 MIL/uL — ABNORMAL LOW (ref 3.87–5.11)
RBC: 3.15 MIL/uL — ABNORMAL LOW (ref 3.87–5.11)
RDW: 14.9 % (ref 11.5–15.5)
RDW: 14.8 % (ref 11.5–15.5)
WBC: 4.9 10*3/uL (ref 4.0–10.5)
WBC: 4.9 10*3/uL (ref 4.0–10.5)
nRBC: 0 % (ref 0.0–0.2)
nRBC: 0 % (ref 0.0–0.2)

## 2023-10-01 LAB — POCT I-STAT 7, (LYTES, BLD GAS, ICA,H+H)
Acid-Base Excess: 5 mmol/L — ABNORMAL HIGH (ref 0.0–2.0)
Bicarbonate: 30.3 mmol/L — ABNORMAL HIGH (ref 20.0–28.0)
Calcium, Ion: 1.28 mmol/L (ref 1.15–1.40)
HCT: 27 % — ABNORMAL LOW (ref 36.0–46.0)
Hemoglobin: 9.2 g/dL — ABNORMAL LOW (ref 12.0–15.0)
O2 Saturation: 94 %
Potassium: 4.2 mmol/L (ref 3.5–5.1)
Sodium: 137 mmol/L (ref 135–145)
TCO2: 32 mmol/L (ref 22–32)
pCO2 arterial: 46.6 mm[Hg] (ref 32–48)
pH, Arterial: 7.422 (ref 7.35–7.45)
pO2, Arterial: 69 mm[Hg] — ABNORMAL LOW (ref 83–108)

## 2023-10-01 LAB — GLUCOSE, CAPILLARY
Glucose-Capillary: 201 mg/dL — ABNORMAL HIGH (ref 70–99)
Glucose-Capillary: 142 mg/dL — ABNORMAL HIGH (ref 70–99)
Glucose-Capillary: 149 mg/dL — ABNORMAL HIGH (ref 70–99)
Glucose-Capillary: 136 mg/dL — ABNORMAL HIGH (ref 70–99)
Glucose-Capillary: 145 mg/dL — ABNORMAL HIGH (ref 70–99)

## 2023-10-01 LAB — HEMOGLOBIN A1C
Hgb A1c MFr Bld: 5.2 % (ref 4.8–5.6)
Mean Plasma Glucose: 102.54 mg/dL

## 2023-10-01 LAB — LACTIC ACID, PLASMA
Lactic Acid, Venous: 2.1 mmol/L (ref 0.5–1.9)
Lactic Acid, Venous: 3 mmol/L (ref 0.5–1.9)
Lactic Acid, Venous: 3.6 mmol/L (ref 0.5–1.9)
Lactic Acid, Venous: 4.3 mmol/L (ref 0.5–1.9)
Lactic Acid, Venous: 5.1 mmol/L (ref 0.5–1.9)

## 2023-10-01 LAB — MRSA NEXT GEN BY PCR, NASAL: MRSA by PCR Next Gen: NOT DETECTED

## 2023-10-01 LAB — PROCALCITONIN: Procalcitonin: 74.32 ng/mL

## 2023-10-01 LAB — CREATININE, SERUM
Creatinine, Ser: 1.75 mg/dL — ABNORMAL HIGH (ref 0.44–1.00)
GFR, Estimated: 28 mL/min — ABNORMAL LOW (ref >60)

## 2023-10-01 LAB — BETA-HYDROXYBUTYRIC ACID: Beta-Hydroxybutyric Acid: 0.17 mmol/L (ref 0.05–0.27)

## 2023-10-01 MED ORDER — DOCUSATE SODIUM 100 MG PO CAPS: 100.0000 mg | ORAL_CAPSULE | Freq: Two times a day (BID) | ORAL | Status: DC | PRN

## 2023-10-01 MED ORDER — SODIUM CHLORIDE 0.9 % IV SOLN
200.0000 mg | Freq: Once | INTRAVENOUS | Status: AC
  Administered 2023-10-01: 200 mg via INTRAVENOUS
  Filled 2023-10-01: qty 40

## 2023-10-01 MED ORDER — INSULIN ASPART 100 UNIT/ML IJ SOLN
0.0000 [IU] | INTRAMUSCULAR | Status: DC
  Administered 2023-10-01: 1 [IU] via SUBCUTANEOUS
  Administered 2023-10-01: 7 [IU] via SUBCUTANEOUS
  Administered 2023-10-01: 1 [IU] via SUBCUTANEOUS
  Administered 2023-10-01: 2 [IU] via SUBCUTANEOUS
  Administered 2023-10-01: 7 [IU] via SUBCUTANEOUS
  Administered 2023-10-01 – 2023-10-02 (×4): 1 [IU] via SUBCUTANEOUS

## 2023-10-01 MED ORDER — ONDANSETRON HCL 4 MG/2ML IJ SOLN: 4.0000 mg | Freq: Four times a day (QID) | INTRAMUSCULAR | Status: DC | PRN

## 2023-10-01 MED ORDER — MAGNESIUM SULFATE 2 GM/50ML IV SOLN
2.0000 g | Freq: Once | INTRAVENOUS | Status: AC
  Administered 2023-10-01: 2 g via INTRAVENOUS
  Filled 2023-10-01: qty 50

## 2023-10-01 MED ORDER — LACTATED RINGERS IV SOLN: INTRAVENOUS | Status: AC

## 2023-10-01 MED ORDER — CHLORHEXIDINE GLUCONATE CLOTH 2 % EX PADS: 6.0000 | MEDICATED_PAD | Freq: Every day | CUTANEOUS | Status: DC

## 2023-10-01 MED ORDER — LEVOTHYROXINE SODIUM 50 MCG PO TABS: 50.0000 ug | ORAL_TABLET | Freq: Every day | ORAL | Status: DC

## 2023-10-01 MED ORDER — VANCOMYCIN VARIABLE DOSE PER UNSTABLE RENAL FUNCTION (PHARMACIST DOSING): Status: DC

## 2023-10-01 MED ORDER — SODIUM CHLORIDE 0.9 % IV SOLN
2.0000 g | INTRAVENOUS | Status: DC
Start: 2023-10-01 — End: 2023-10-02
  Administered 2023-10-01: 2 g via INTRAVENOUS
  Filled 2023-10-01: qty 12.5

## 2023-10-01 MED ORDER — METHYLPREDNISOLONE SODIUM SUCC 125 MG IJ SOLR
120.0000 mg | Freq: Two times a day (BID) | INTRAMUSCULAR | Status: DC
  Administered 2023-10-01: 120 mg via INTRAVENOUS
  Filled 2023-10-01: qty 2

## 2023-10-01 MED ORDER — SODIUM CHLORIDE 0.9 % IV SOLN
500.0000 mg | INTRAVENOUS | Status: DC
  Administered 2023-10-01: 500 mg via INTRAVENOUS
  Filled 2023-10-01: qty 5

## 2023-10-01 MED ORDER — POTASSIUM CHLORIDE 10 MEQ/100ML IV SOLN
10.0000 meq | INTRAVENOUS | Status: AC
  Administered 2023-10-01 (×5): 10 meq via INTRAVENOUS
  Filled 2023-10-01 (×5): qty 100

## 2023-10-01 MED ORDER — HEPARIN SODIUM (PORCINE) 5000 UNIT/ML IJ SOLN
5000.0000 [IU] | Freq: Three times a day (TID) | INTRAMUSCULAR | Status: DC
Start: 2023-10-01 — End: 2023-10-02
  Administered 2023-10-01 – 2023-10-02 (×4): 5000 [IU] via SUBCUTANEOUS
  Filled 2023-10-01 (×4): qty 1

## 2023-10-01 MED ORDER — CALCIUM GLUCONATE-NACL 2-0.675 GM/100ML-% IV SOLN
2.0000 g | Freq: Once | INTRAVENOUS | Status: AC
  Administered 2023-10-01: 2000 mg via INTRAVENOUS
  Filled 2023-10-01: qty 100

## 2023-10-01 MED ORDER — POLYETHYLENE GLYCOL 3350 17 G PO PACK: 17.0000 g | PACK | Freq: Every day | ORAL | Status: DC | PRN

## 2023-10-01 MED ORDER — UMECLIDINIUM BROMIDE 62.5 MCG/ACT IN AEPB
1.0000 | INHALATION_SPRAY | Freq: Every day | RESPIRATORY_TRACT | Status: DC
  Filled 2023-10-01: qty 7

## 2023-10-01 MED ORDER — SODIUM CHLORIDE 0.9 % IV SOLN
100.0000 mg | Freq: Every day | INTRAVENOUS | Status: DC
  Filled 2023-10-01: qty 20

## 2023-10-01 MED ORDER — ACETAMINOPHEN 650 MG RE SUPP
650.0000 mg | Freq: Four times a day (QID) | RECTAL | Status: DC | PRN
  Administered 2023-10-01: 650 mg via RECTAL
  Filled 2023-10-01: qty 1

## 2023-10-01 MED ORDER — IPRATROPIUM-ALBUTEROL 0.5-2.5 (3) MG/3ML IN SOLN
3.0000 mL | Freq: Four times a day (QID) | RESPIRATORY_TRACT | Status: DC
  Administered 2023-10-01 (×2): 3 mL via RESPIRATORY_TRACT
  Filled 2023-10-01 (×2): qty 3

## 2023-10-01 MED ORDER — DEXMEDETOMIDINE HCL IN NACL 400 MCG/100ML IV SOLN
0.0000 ug/kg/h | INTRAVENOUS | Status: DC
  Administered 2023-10-01: 0.4 ug/kg/h via INTRAVENOUS
  Administered 2023-10-01: 0.6 ug/kg/h via INTRAVENOUS
  Administered 2023-10-01: 0.4 ug/kg/h via INTRAVENOUS
  Administered 2023-10-02: 0.8 ug/kg/h via INTRAVENOUS
  Filled 2023-10-01 (×6): qty 100

## 2023-10-01 MED ORDER — METHYLPREDNISOLONE SODIUM SUCC 40 MG IJ SOLR: 40.0000 mg | Freq: Every day | INTRAMUSCULAR | Status: DC

## 2023-10-01 MED ORDER — BUDESONIDE 0.25 MG/2ML IN SUSP
0.2500 mg | Freq: Two times a day (BID) | RESPIRATORY_TRACT | Status: DC
Start: 2023-10-01 — End: 2023-10-02
  Administered 2023-10-01 (×2): 0.25 mg via RESPIRATORY_TRACT
  Filled 2023-10-01 (×2): qty 2

## 2023-10-01 MED ORDER — ARFORMOTEROL TARTRATE 15 MCG/2ML IN NEBU
15.0000 ug | INHALATION_SOLUTION | Freq: Two times a day (BID) | RESPIRATORY_TRACT | Status: DC
  Administered 2023-10-01 (×2): 15 ug via RESPIRATORY_TRACT
  Filled 2023-10-01 (×2): qty 2

## 2023-10-01 MED ORDER — SERTRALINE HCL 50 MG PO TABS: 25.0000 mg | ORAL_TABLET | Freq: Every day | ORAL | Status: DC

## 2023-10-01 MED ORDER — REVEFENACIN 175 MCG/3ML IN SOLN
175.0000 ug | Freq: Every day | RESPIRATORY_TRACT | Status: DC
  Administered 2023-10-01: 175 ug via RESPIRATORY_TRACT
  Filled 2023-10-01: qty 3

## 2023-10-01 MED ORDER — PRAVASTATIN SODIUM 40 MG PO TABS: 40.0000 mg | ORAL_TABLET | Freq: Every day | ORAL | Status: DC

## 2023-10-01 NOTE — Progress Notes (Signed)
 NAME:  Charlotte Harris, MRN:  [id number], DOB:  [birth year], LOS: 0 ADMISSION DATE:  09/30/2023, CONSULTATION DATE:  10/01/2023 REFERRING MD:  CHIEF COMPLAINT:  Septic Shock   History of Present Illness:  Charlotte Harris is a 89 y.o. female with PMH significant for Severe COPD on 2 L home oxygen, HTN, HLD, Hemorrhagic Stroke 12/25, DM, Right upper lobe adenocarcinoma s/p radiation, and Breast cancer s/p right mastectomy, Hypothyroidism, and OA who presents to the ER today with worsening shortness of breath.  To review, according to son and chart review [facility] resides [facility] where she was noted to be ill starting on Saturday, tested + for COVID 2/26 and was started on Paxlovid.  The following day she was requiring increased oxygen to 4 liters, frequent nebulizers and yet remained hypoxic. EMS was called, O2 sats 80%. Upon arrival to the ER she was placed on Bipap. Labs revealed WBC 11.8, lactic acid 5.5, BUN 55 creat 1.99, GFR 24, Chloride 92, Glucose 264, AG 19, BNP 258, UA negative. CXR New left perihilar infiltration, likely pneumonia and Chronic RU lobe mass. Blood cultures sent, broad spectrum antibiotics started, Solumedrol 120 mg. Despite 2.5 Liters IVF she remained hypotensive requiring norepinephrine infusion.   Pertinent  Medical History  COPD on 2 L home oxygen HTN HLD Hemorrhagic Stroke 12/25 DM COVID +  Significant Hospital Events: Including procedures, antibiotic start and stop dates in addition to other pertinent events   2/28: Patient admitted from nursing home with Covid infection, respiratory distress, and septic shock. IVF, Cultures, antibiotics. Bipap. Norepinephrine   Interim History / Subjective:  No acute events overnight Remains on BiPAP, Precedex NE 10, BPs borderline.    Objective   Blood pressure (!) 82/52, pulse (!) 107, temperature 99 F (37.2 C), temperature source Axillary, resp. rate (!) 24, height 5\' 2"  (1.575 m), weight 44.6 kg, SpO2 94%.     Vent Mode: PCV;BIPAP FiO2 (%):  [40 %-100 %] 40 % Set Rate:  [15 bmp] 15 bmp PEEP:  [5 cmH20-6 cmH20] 6 cmH20   Intake/Output Summary (Last 24 hours) at 10/01/2023 1056 Last data filed at 10/01/2023 0900 Gross per 24 hour  Intake 4310.78 ml  Output 20 ml  Net 4290.78 ml   Filed Weights   09/30/23 1936  Weight: 44.6 kg    Resolved Hospital Problem list   None  Assessment & Plan:   Acute Hypoxic Hypercarbic Respiratory Failure secondary to COVID PNA and likely bacterial PNA as well based on PCT 74.  Severe COPD O2 dependent 2 liters [place] with likely acute exacerbation.  -Bipap, wean as tolerated -Repeat ABG, VBG pH 7.06 seems incorrect based on exam, but if true, likely not much more we can offer.  -Sedation: Precedex, wean as tolerated RASS goal 0 to -1. -Continue home Budesonide, brovana, yupelri -Sat goal 90% - Remdesivir - Cefepime + Azythromycin - DC vanco - Solumedrol 40mg  daily  Septic Shock: echo in December with good EF -MAP goal > 65 -Norepinephrine infusion, titrate to achieve goal. Discussed CVL with family. Will place if unable to wean pressors.  -ABX as above  Diabetes Type 2 -BGC w/ Sliding scale Q 4 hours -HgbA1c and BHA pending  Acute Kidney Injury, suspect due to dehydration -Fluid resuscitation -Avoid nephrotoxic agents -Trend BMP  Hypothyroidism -Continue home Synthroid  Hyperlipidemia -Continue home Pravachol  Essential Hypertension -Hold home Cozaar, Metoprolol, Procardia  Anxiety -Resume home Atarax, Zoloft once able to take PO  Best Practice (right click and "Reselect  all SmartList Selections" daily)   Diet/type: NPO DVT prophylaxis prophylactic heparin , SCDs Pressure ulcer(s): present on admission  GI prophylaxis: H2B Lines: N/A - Family is Okay with a central line if needed Foley:  Yes, and it is still needed Code Status:  DNR/DNI Last date of multidisciplinary goals of care discussion [Updated son at bedside in ER]  Labs    CBC: Recent Labs  Lab 09/30/23 2000 09/30/23 2006 10/01/23 0331 10/01/23 0336 10/01/23 0908  WBC 11.8*  --  4.9  --  4.9  NEUTROABS 9.8*  --   --   --   --   HGB 11.2* 12.2 9.4* 10.2* 9.8*  HCT 36.2 36.0 30.5* 30.0* 30.6*  MCV 98.4  --  98.7  --  97.1  PLT 222  --  149*  --  168   Basic Metabolic Panel: Recent Labs  Lab 09/30/23 2000 09/30/23 2006 10/01/23 0331 10/01/23 0336 10/01/23 0341 10/01/23 0908  NA 141 141 141 142  --  142  K 3.5 3.5 2.9* 2.9*  --  3.8  CL 92*  --  98  --   --  99  CO2 30  --  27  --   --  28  GLUCOSE 264*  --  340*  --   --  197*  BUN 55*  --  55*  --   --  59*  CREATININE 1.99*  --  1.69*  --  1.75* 1.78*  CALCIUM 10.1  --  8.5*  --   --  9.4  MG  --   --  1.7  --   --  2.5*  PHOS  --   --  3.3  --   --  3.6   GFR: Estimated Creatinine Clearance: 15.1 mL/min (A) (by C-G formula based on SCr of 1.78 mg/dL (H)). Recent Labs  Lab 09/30/23 2000 09/30/23 2129 09/30/23 2306 10/01/23 0053 10/01/23 0331 10/01/23 0341 10/01/23 0908  PROCALCITON  --   --   --   --   --  74.32  --   WBC 11.8*  --   --   --  4.9  --  4.9  LATICACIDVEN  --  5.5* 5.1* 4.3*  --   --  3.6*   Liver Function Tests: Recent Labs  Lab 09/30/23 2000  AST 49*  ALT 20  ALKPHOS 49  BILITOT 0.6  PROT 7.6  ALBUMIN 3.5   ABG    Component Value Date/Time   PHART 7.416 07/09/2023 0913   PCO2ART 58.1 (H) 07/09/2023 0913   PO2ART 69 (L) 07/09/2023 0913   HCO3 24.9 10/01/2023 0906   TCO2 32 10/01/2023 0336   ACIDBASEDEF 7.6 (H) 10/01/2023 0906   O2SAT 68.4 10/01/2023 0906    CBG: Recent Labs  Lab 10/01/23 0244 10/01/23 0402 10/01/23 0613 10/01/23 0826  GLUCAP 323* 318* 257* 201*     Past Medical History:  She,  has a past medical history of ADENOCARCINOMA, LUNG (07/03/2010), ASYMPTOMATIC POSTMENOPAUSAL STATUS (07/02/2009), Breast cancer (HCC) (1991), COLONIC POLYPS, HX OF (04/16/2007), COPD (05/17/2008), HEMATURIA, HX OF (04/16/2007), History of radiation  therapy (07/30/09 to 08/09/09), HYPERLIPIDEMIA (04/16/2007), Hypertension, HYPERTENSION (04/16/2007), HYPOTHYROIDISM, POST-RADIATION (06/07/2008), Osteoarthritis, OSTEOPOROSIS (07/02/2009), and Wrist fracture, left.   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 Her smoking use included cigarettes. She started smoking about 64 years ago. She has a 50 pack-year smoking history. She has never used smokeless tobacco. She reports current alcohol use of about 2.0 standard drinks of alcohol per week. She reports that she does not use drugs.   Family History:  Her family history includes Cancer in her brother, brother, and sister; Heart attack (age of onset: 30) in her mother; Heart disease in her father, mother, sister, and another family member; Hypertension in an other family member. There is no history of Colon cancer, Stomach cancer, or Stroke.   Allergies Allergies  Allergen Reactions   Crestor [Rosuvastatin Calcium]     cramping     Home Medications  Prior to Admission medications   Medication Sig Start Date End Date Taking? Authorizing Provider  acetaminophen (TYLENOL) 500 MG tablet Take 500 mg by mouth every 6 (six) hours as needed for headache (pain).    Yes [provider]  albuterol (VENTOLIN HFA) 108 (90 Base) MCG/ACT inhaler Inhale 2 puffs into the lungs every 6 (six) hours as needed for wheezing or shortness of breath. 09/18/22  Yes Icard, Rachel Bo, DO  Amino Acids-Protein Hydrolys (PRO-STAT PO) Take 30 mLs by mouth daily.   Yes [provider]  Ascorbic Acid (VITAMIN C) 100 MG tablet Take 100 mg by mouth daily.   Yes [provider]  Ascorbic Acid (VITAMIN C) 1000 MG tablet Take 1 tablet (1,000 mg total) by mouth daily for 7 days. 09/29/23 10/06/23 Yes Fargo, Amy E, NP  budesonide (PULMICORT) 0.25 MG/2ML nebulizer solution USE (0.25 MG TOTAL) BY NEBULIZATION TWICE A DAY. 02/28/21  Yes Koberlein, Junell C, MD  Calcium Carb-Cholecalciferol (CALCIUM 1000 + D PO) Take 2 tablets by mouth daily.   Yes [provider]  FERROUS SULFATE PO Take 1 tablet by mouth every morning.   Yes [provider]  hydrOXYzine (ATARAX) 25 MG tablet Take 25 mg by mouth daily. 08/27/23  Yes [provider]  hydrOXYzine (VISTARIL) 25 MG capsule Take 1 capsule (25 mg total) by mouth at bedtime. May also take 1 capsule (25 mg total) daily as needed. 08/25/23  Yes Fargo, Amy E, NP  levothyroxine (SYNTHROID) 50 MCG tablet TAKE 1 TABLET BY MOUTH EVERY DAY BEFORE BREAKFAST 05/28/23  Yes Karie Georges, MD  losartan (COZAAR) 100 MG tablet Take 1 tablet (100 mg total) by mouth daily. 05/28/23  Yes Karie Georges, MD  metoprolol succinate (TOPROL-XL) 25 MG 24 hr tablet Take 1 tablet (25 mg total) by mouth daily. 05/28/23  Yes Karie Georges, MD  Multiple Vitamins-Minerals (PRESERVISION AREDS 2+MULTI VIT) CAPS Take 1 capsule by mouth every morning.   Yes [provider]  NIFEdipine (PROCARDIA XL/NIFEDICAL-XL) 90 MG 24 hr tablet Take 1 tablet (90 mg total) by mouth daily. 07/15/23  Yes Lanae Boast, MD  nirmatrelvir/ritonavir (PAXLOVID) 20 x 150 MG & 10 x 100MG  TABS Take 3 tablets by mouth 2 (two) times daily for 5 days. (Take nirmatrelvir 150 mg two tablets twice daily for 5 days and ritonavir 100 mg one tablet twice daily for 5 days) Patient GFR is 86 09/29/23 10/04/23 Yes Fargo, Amy E, NP  OXYGEN Inhale 2 L into the lungs continuous.   Yes  [provider]  pravastatin (PRAVACHOL) 40 MG tablet Place 1 tablet (40 mg total) into feeding tube daily at 6 PM. 07/14/23  Yes Kc, Ramesh, MD  promethazine  (PHENERGAN) 12.5 MG tablet Take 12.5 mg by mouth every 6 (six) hours as needed for nausea or vomiting.   Yes [provider]  senna-docusate (SENOKOT-S) 8.6-50 MG tablet Take 1 tablet by mouth as needed for mild constipation.   Yes [provider]  sertraline (ZOLOFT) 25 MG tablet Take 1 tablet (25 mg total) by mouth daily. 09/22/23  Yes Fargo, Amy E, NP  Tiotropium Bromide-Olodaterol 2.5-2.5 MCG/ACT AERS Inhale 2 puffs into the lungs every morning.   Yes [provider]  vitamin B-12 (CYANOCOBALAMIN) 100 MCG tablet Take 100 mcg by mouth daily.   Yes [provider]  zinc gluconate 50 MG tablet Take 1 tablet (50 mg total) by mouth daily for 7 days. 09/29/23 10/06/23 Yes Fargo, Amy E, NP  zinc oxide 20 % ointment Apply 1 Application topically as needed for irritation.   Yes [provider]     Critical care time: 48 minutes     Joneen Roach, AGACNP-BC [facility]  See Amion for personal pager PCCM on call pager [phone] until 7pm. Please call Elink 7p-7a. [phone]  10/01/2023 11:06 AM

## 2023-10-01 NOTE — Progress Notes (Signed)
 Pharmacy Antibiotic Note  Charlotte Harris is a 89 y.o. female admitted on 09/30/2023 with sepsis.  Pharmacy has been consulted for vancomycin dosing.  Pt w/ AKI; baseline SCr <1, now 2.  Plan: Rec'd vanc 1g in ED; will monitor SCr +/- vanc levels prior to redosing. Change ordered cefepime to Q24H for now given AKI.  Height: 5\' 2"  (157.5 cm) Weight: 44.6 kg (98 lb 5.2 oz) IBW/kg (Calculated) : 50.1  Temp (24hrs), Avg:99.8 F (37.7 C), Min:98.5 F (36.9 C), Max:101 F (38.3 C)  Recent Labs  Lab 09/30/23 2000 09/30/23 2129 09/30/23 2306  WBC 11.8*  --   --   CREATININE 1.99*  --   --   LATICACIDVEN  --  5.5* 5.1*    Estimated Creatinine Clearance: 13.5 mL/min (A) (by C-G formula based on SCr of 1.99 mg/dL (H)).    Allergies  Allergen Reactions   Crestor [Rosuvastatin Calcium]     cramping    Thank you for allowing pharmacy to be a part of this patient's care.  Vernard Gambles, PharmD, BCPS  10/01/2023 1:02 AM

## 2023-10-01 NOTE — ED Notes (Signed)
 RT at bedside to change bipap per Dr Jarold Motto

## 2023-10-01 NOTE — ED Notes (Addendum)
 Bridgett NP made aware that pt blood pressure MAP is <65 but >60 and that levo is at 44mcg/min at this time. I let her know the precedex has been decreased to 0.4mcg/kg/hr as well. She stated she was fine with a MAP between 60-65. She stated to go up on the levo drip to 68mcg/min at this time. Per pharmacy it is okay to go past 44mcg/min on levo through the peripheral IV

## 2023-10-01 NOTE — Plan of Care (Addendum)
 Upon initial assessment patient was found to be hypotensive. Levo titrated, provider notified and at bedside. Fluid bolus given. Central line placed. Patient had increased anxiety during the shift, precedex increased. Patient and family updated in plan of care. ICU status maintained.   Problem: Education: Goal: Knowledge of risk factors and measures for prevention of condition will improve Outcome: Not Progressing   Problem: Coping: Goal: Psychosocial and spiritual needs will be supported Outcome: Not Progressing   Problem: Respiratory: Goal: Will maintain a patent airway Outcome: Not Progressing Goal: Complications related to the disease process, condition or treatment will be avoided or minimized Outcome: Not Progressing   Problem: Education: Goal: Ability to describe self-care measures that may prevent or decrease complications (Diabetes Survival Skills Education) will improve Outcome: Not Progressing Goal: Individualized Educational Video(s) Outcome: Not Progressing   Problem: Coping: Goal: Ability to adjust to condition or change in health will improve Outcome: Not Progressing   Problem: Fluid Volume: Goal: Ability to maintain a balanced intake and output will improve Outcome: Not Progressing   Problem: Health Behavior/Discharge Planning: Goal: Ability to identify and utilize available resources and services will improve Outcome: Not Progressing Goal: Ability to manage health-related needs will improve Outcome: Not Progressing   Problem: Metabolic: Goal: Ability to maintain appropriate glucose levels will improve Outcome: Not Progressing   Problem: Nutritional: Goal: Maintenance of adequate nutrition will improve Outcome: Not Progressing Goal: Progress toward achieving an optimal weight will improve Outcome: Not Progressing   Problem: Skin Integrity: Goal: Risk for impaired skin integrity will decrease Outcome: Not Progressing   Problem: Tissue Perfusion: Goal:  Adequacy of tissue perfusion will improve Outcome: Not Progressing   Problem: Education: Goal: Knowledge of General Education information will improve Description: Including pain rating scale, medication(s)/side effects and non-pharmacologic comfort measures Outcome: Not Progressing   Problem: Health Behavior/Discharge Planning: Goal: Ability to manage health-related needs will improve Outcome: Not Progressing   Problem: Clinical Measurements: Goal: Ability to maintain clinical measurements within normal limits will improve Outcome: Not Progressing Goal: Will remain free from infection Outcome: Not Progressing Goal: Diagnostic test results will improve Outcome: Not Progressing Goal: Respiratory complications will improve Outcome: Not Progressing Goal: Cardiovascular complication will be avoided Outcome: Not Progressing   Problem: Activity: Goal: Risk for activity intolerance will decrease Outcome: Not Progressing   Problem: Nutrition: Goal: Adequate nutrition will be maintained Outcome: Not Progressing   Problem: Coping: Goal: Level of anxiety will decrease Outcome: Not Progressing   Problem: Elimination: Goal: Will not experience complications related to bowel motility Outcome: Not Progressing Goal: Will not experience complications related to urinary retention Outcome: Not Progressing   Problem: Pain Managment: Goal: General experience of comfort will improve and/or be controlled Outcome: Not Progressing   Problem: Safety: Goal: Ability to remain free from injury will improve Outcome: Not Progressing   Problem: Skin Integrity: Goal: Risk for impaired skin integrity will decrease Outcome: Not Progressing

## 2023-10-01 NOTE — Procedures (Signed)
 Central Venous Catheter Insertion Procedure Note  AKAISHA TRUMAN  [id number]  [birth year]  Date:10/01/23  Time:2:02 PM   Provider Performing:Xaivier Malay Lacretia Nicks Mikey Bussing   Procedure: Insertion of Non-tunneled Central Venous [phone]) with US guidance ([id number])   Indication(s) Medication administration  Consent Risks of the procedure as well as the alternatives and risks of each were explained to the patient and/or caregiver.  Consent for the procedure was obtained and is signed in the bedside chart  Anesthesia Topical only with 1% lidocaine   Timeout Verified patient identification, verified procedure, site/side was marked, verified correct patient position, special equipment/implants available, medications/allergies/relevant history reviewed, required imaging and test results available.  Sterile Technique Maximal sterile technique including full sterile barrier drape, hand hygiene, sterile gown, sterile gloves, mask, hair covering, sterile ultrasound probe cover (if used).  Procedure Description Area of catheter insertion was cleaned with chlorhexidine and draped in sterile fashion.  With real-time ultrasound guidance a central venous catheter was placed into the right femoral vein. Nonpulsatile blood flow and easy flushing noted in all ports.  The catheter was sutured in place and sterile dressing applied.  Complications/Tolerance None; patient tolerated the procedure well. Chest X-ray is ordered to verify placement for internal jugular or subclavian cannulation.   Chest x-ray is not ordered for femoral cannulation.  EBL Minimal  Specimen(s) None    Joneen Roach, AGACNP-BC [facility]  See Amion for personal pager PCCM on call pager [phone] until 7pm. Please call Elink 7p-7a. [phone]  10/01/2023 2:03 PM

## 2023-10-01 NOTE — ED Notes (Signed)
 Resp therapy made aware of pt needing to be transported to ICU. They will be down to assist when they can

## 2023-10-01 NOTE — ED Notes (Signed)
 Clarisse Gouge NP made aware of 0600 cbg being 257. No insulin to be given at this time. Follow along with sliding scale orders for 0800.

## 2023-10-01 NOTE — TOC Initial Note (Signed)
 Transition of Care [facility]) - Initial/Assessment Note    Patient Details  Name: Charlotte Harris MRN: [id number] Date of Birth: [birth year]  [facility] [address]) CM/SW Contact:    Mearl Latin, LCSW Phone Number: 10/01/2023, 3:34 PM  Clinical Narrative:                 Patient admitted from [facility] SNF with COVID. TOC continuing to follow.   Expected Discharge Plan: Skilled Nursing Facility Barriers to Discharge: Continued Medical Work up   Patient Goals and CMS Choice            Expected Discharge Plan and Services In-house Referral: Clinical Social Work     Living arrangements for the past 2 months: Skilled Nursing Facility                                      Prior Living Arrangements/Services Living arrangements for the past 2 months: Skilled Nursing Facility   Patient language and need for interpreter reviewed:: Yes Do you feel safe going back to the place where you live?: Yes      Need for Family Participation in Patient Care: Yes (Comment) Care giver support system in place?: Yes (comment)   Criminal Activity/Legal Involvement Pertinent to Current Situation/Hospitalization: No - Comment as needed  Activities of Daily Living      Permission Sought/Granted Permission sought to share information with : Facility Medical sales representative, Family Supports Permission granted to share information with : No     Permission granted to share info w AGENCY: [facility]        Emotional Assessment       Orientation: : Oriented to Self, Oriented to Place, Oriented to Situation Alcohol / Substance Use: Not Applicable Psych Involvement: No (comment)  Admission diagnosis:  Mixed hyperlipidemia [E78.2] Hypothyroidism following radioiodine therapy [E89.0] Anxiety [F41.9] Type II diabetes mellitus with complication (HCC) [E11.8] Septic shock (HCC) [A41.9, R65.21] COPD without exacerbation (HCC) [J44.9] Acute on chronic hypoxic respiratory  failure (HCC) [J96.21] Patient Active Problem List   Diagnosis Date Noted   Septic shock (HCC) 10/01/2023   Pneumonia due to COVID-19 virus 10/01/2023   Acute on chronic hypoxic respiratory failure (HCC) 07/12/2023   Aspiration into airway 07/10/2023   Pressure injury of skin 07/10/2023   Protein-calorie malnutrition, severe 07/09/2023   ICH (intracerebral hemorrhage) (HCC) 07/08/2023   Iron deficiency anemia 05/28/2023   Underweight due to inadequate caloric intake 05/15/2022   DNR (do not resuscitate) 08/11/2018   Solitary pulmonary nodule 07/29/2018   Hypoxemia    Lung mass    Normocytic normochromic anemia 07/19/2018   Type II diabetes mellitus with complication (HCC) 07/19/2018   Mass of upper lobe of left lung    Acute respiratory failure with hypoxia (HCC) 07/18/2018   Weight loss 07/21/2017   Hip pain, right 01/25/2017   Vitamin D deficiency 07/16/2016   Side effects of treatment 11/15/2014   Diabetes (HCC) 07/10/2012   Malignant neoplasm of right upper lobe of lung (HCC) 03/05/2012   Breast cancer (HCC)    History of radiation therapy    Cancer (HCC)    Encounter for long-term (current) use of other medications 07/06/2011   Malignant neoplasm of bronchus and lung (HCC) 07/03/2010   TOBACCO USE, QUIT 07/03/2010   Osteoporosis 07/02/2009   ASYMPTOMATIC POSTMENOPAUSAL STATUS 07/02/2009   PERIPHERAL EDEMA 02/15/2009   Hypothyroidism following radioiodine therapy  06/07/2008   COPD without exacerbation (HCC) 05/17/2008   Mixed hyperlipidemia 04/16/2007   HTN (hypertension) 04/16/2007   BREAST CANCER, HX OF 04/16/2007   History of colonic polyps 04/16/2007   HEMATURIA, HX OF 04/16/2007   PCP:  Mahlon Gammon, MD Pharmacy:   [id number] [place], [place] - [address] [place] [address] Phone: [phone] Fax: [phone]  [facility] - [place], [place] - [address] [place]  [address] Phone: [phone] Fax: [phone]     Social Drivers of Health (SDOH) Social History: SDOH Screenings   Food Insecurity: No Food Insecurity (07/30/2023)  Housing: Low Risk  (07/30/2023)  Transportation Needs: No Transportation Needs (07/30/2023)  Utilities: Not At Risk (07/30/2023)  Alcohol Screen: Low Risk  (07/30/2023)  Depression (PHQ2-9): Low Risk  (07/30/2023)  Financial Resource Strain: Low Risk  (05/10/2022)  Physical Activity: Unknown (05/10/2022)  Recent Concern: Physical Activity - Inactive (05/10/2022)  Social Connections: Unknown (07/30/2023)  Stress: No Stress Concern Present (05/10/2022)  Tobacco Use: Medium Risk (09/30/2023)  Health Literacy: Adequate Health Literacy (07/30/2023)   SDOH Interventions:     Readmission Risk Interventions     No data to display

## 2023-10-01 NOTE — ED Notes (Signed)
 Called ICU and attempted to call report. ICU RN not able to take report at this time and will call back in just a few min and get report.

## 2023-10-01 NOTE — ED Notes (Signed)
 Bridgett NP at bedside and notified of bp levo drip at 64mcg/min

## 2023-10-01 NOTE — H&P (Signed)
 NAME:  Charlotte Harris, MRN:  [id number], DOB:  [birth year], LOS: 0 ADMISSION DATE:  09/30/2023, CONSULTATION DATE:  10/01/2023 REFERRING MD:  CHIEF COMPLAINT:  Septic Shock   History of Present Illness:  Charlotte Harris is a 89 y.o. female with PMH significant for Severe COPD on 2 L home oxygen, HTN, HLD, Hemorrhagic Stroke 12/25, DM, Right upper lobe adenocarcinoma s/p radiation, and Breast cancer s/p right mastectomy, Hypothyroidism, and OA who presents to the ER today with worsening shortness of breath.  To review, according to son and chart review [facility] where she was noted to be ill starting on Saturday, tested + for COVID 2/26 and was started on Paxlovid.  The following day she was requiring increased oxygen to 4 liters, frequent nebulizers and yet remained hypoxic. EMS was called, O2 sats 80%. Upon arrival to the ER she was placed on Bipap. Labs revealed WBC 11.8, lactic acid 5.5, BUN 55 creat 1.99, GFR 24, Chloride 92, Glucose 264, AG 19, BNP 258, UA negative. CXR New left perihilar infiltration, likely pneumonia and Chronic RU lobe mass. Blood cultures sent, broad spectrum antibiotics started, Solumedrol 120 mg. Despite 2.5 Liters IVF she remained hypotensive requiring norepinephrine infusion.   Pertinent  Medical History  COPD on 2 L home oxygen HTN HLD Hemorrhagic Stroke 12/25 DM COVID +  Significant Hospital Events: Including procedures, antibiotic start and stop dates in addition to other pertinent events   2/28: Patient admitted from nursing home with Covid infection, respiratory distress, and septic shock. IVF, Cultures, antibiotics. Bipap. Norepinephrine   Interim History / Subjective:  Started on precedex infusion to allow for compliance with Bipap.  Objective   Blood pressure (!) 89/50, pulse 93, temperature 98.5 F (36.9 C), temperature source Axillary, resp. rate (!) 23, height 5\' 2"  (1.575 m), weight 44.6 kg, SpO2 100%.    FiO2 (%):  [50  %-100 %] 50 % PEEP:  [5 cmH20-6 cmH20] 6 cmH20   Intake/Output Summary (Last 24 hours) at 10/01/2023 0316 Last data filed at 10/01/2023 0034 Gross per 24 hour  Intake 3152.04 ml  Output --  Net 3152.04 ml   Filed Weights   09/30/23 1936  Weight: 44.6 kg   Physical Exam Vitals reviewed.  Constitutional:      Appearance: She is ill-appearing.     Comments: Restless, Respiratory distress  HENT:     Head: Normocephalic and atraumatic.  Eyes:     Pupils: Pupils are equal, round, and reactive to light.  Cardiovascular:     Rate and Rhythm: Regular rhythm. Tachycardia present.  Pulmonary:     Effort: Tachypnea, accessory muscle usage and respiratory distress present.  Abdominal:     Comments: Tender to palpation, soft  Musculoskeletal:        General: Normal range of motion.     Cervical back: Normal range of motion.     Right lower leg: No edema.     Left lower leg: No edema.  Skin:    General: Skin is warm and dry.     Capillary Refill: Capillary refill takes less than 2 seconds.  Neurological:     Comments: Awake, restless, follows commands, unable to assess speech, left sided weakness (baseline)  Psychiatric:        Mood and Affect: Mood is anxious.    Resolved Hospital Problem list   None  Assessment & Plan:  Acute Hypoxic Respiratory Failure, Requiring Bipap, due to Covid + and suspected PNA Severe COPD O2  dependent 2 liters [place] -Bipap, wean as tolerated -Trend VBG/ABG -Sedation: Precedex, wean as tolerated -Continue home Pulmicort and Brovana + Incruse Ellipta -Duonebs Q 6 hours  Septic Shock -MAP goal > 65 if able -Norepinephrine infusion, titrate to achieve goal -s/p 2.5 Liters IVF bolus in ER -BC x 2, Sputum if able, procalcitonin, MRSA swab all pending, UA negative -Solumedrol 120 mg 12 hours -Vancomycin + Cefepime + Azythromycin -Remdesivir daily  Diabetes Type 2 -BGC w/ Sliding scale Q 4 hours -HgbA1c and BHA pending  Acute Kidney Injury, suspect  due to dehydration -Fluid resuscitation -Avoid nephrotoxic agents -Trend BMP  Hypothyroidism -Continue home Synthroid  Hyperlipidemia -Continue home Pravachol  Essential Hypertension -Hold home Cozaar, Metoprolol, Procardia  Anxiety -Resume home Atarax, Zoloft once able to take PO  Best Practice (right click and "Reselect all SmartList Selections" daily)   Diet/type: NPO DVT prophylaxis prophylactic heparin , SCDs Pressure ulcer(s): present on admission  GI prophylaxis: H2B Lines: N/A - Family is Okay with a central line if needed Foley:  Yes, and it is still needed Code Status:  DNR/DNI Last date of multidisciplinary goals of care discussion [Updated son at bedside in ER]  Labs   CBC: Recent Labs  Lab 09/30/23 2000 09/30/23 2006  WBC 11.8*  --   NEUTROABS 9.8*  --   HGB 11.2* 12.2  HCT 36.2 36.0  MCV 98.4  --   PLT 222  --    Basic Metabolic Panel: Recent Labs  Lab 09/30/23 2000 09/30/23 2006  NA 141 141  K 3.5 3.5  CL 92*  --   CO2 30  --   GLUCOSE 264*  --   BUN 55*  --   CREATININE 1.99*  --   CALCIUM 10.1  --    GFR: Estimated Creatinine Clearance: 13.5 mL/min (A) (by C-G formula based on SCr of 1.99 mg/dL (H)). Recent Labs  Lab 09/30/23 2000 09/30/23 2129 09/30/23 2306  WBC 11.8*  --   --   LATICACIDVEN  --  5.5* 5.1*   Liver Function Tests: Recent Labs  Lab 09/30/23 2000  AST 49*  ALT 20  ALKPHOS 49  BILITOT 0.6  PROT 7.6  ALBUMIN 3.5   ABG    Component Value Date/Time   PHART 7.416 07/09/2023 0913   PCO2ART 58.1 (H) 07/09/2023 0913   PO2ART 69 (L) 07/09/2023 0913   HCO3 30.6 (H) 09/30/2023 2335   TCO2 39 (H) 09/30/2023 2006   O2SAT 81.1 09/30/2023 2335    CBG: Recent Labs  Lab 10/01/23 0244  GLUCAP 323*    Review of Systems:   Difficult to obtain due to bipap needs. Nods no to chest pain, nausea. Complains of shortness of breath  Past Medical History:  She,  has a past medical history of ADENOCARCINOMA, LUNG  (07/03/2010), ASYMPTOMATIC POSTMENOPAUSAL STATUS (07/02/2009), Breast cancer (HCC) (1991), COLONIC POLYPS, HX OF (04/16/2007), COPD (05/17/2008), HEMATURIA, HX OF (04/16/2007), History of radiation therapy (07/30/09 to 08/09/09), HYPERLIPIDEMIA (04/16/2007), Hypertension, HYPERTENSION (04/16/2007), HYPOTHYROIDISM, POST-RADIATION (06/07/2008), Osteoarthritis, OSTEOPOROSIS (07/02/2009), and Wrist fracture, left.   Surgical History:   Past Surgical History:  Procedure Laterality Date   BREAST SURGERY     lumpectomy   EYE SURGERY Bilateral    cataracts   LUNG BIOPSY  08/31/11   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   LUNG BIOPSY  05/22/2009   RUL lung=Adenocarcinoma   MASTECTOMY  1991   right   TONSILLECTOMY  1955     Social History:  reports that she quit smoking about 14 years ago. Her smoking use included cigarettes. She started smoking about 64 years ago. She has a 50 pack-year smoking history. She has never used smokeless tobacco. She reports current alcohol use of about 2.0 standard drinks of alcohol per week. She reports that she does not use drugs.   Family History:  Her family history includes Cancer in her brother, brother, and sister; Heart attack (age of onset: 71) in her mother; Heart disease in her father, mother, sister, and another family member; Hypertension in an other family member. There is no history of Colon cancer, Stomach cancer, or Stroke.   Allergies Allergies  Allergen Reactions   Crestor [Rosuvastatin Calcium]     cramping     Home Medications  Prior to Admission medications   Medication Sig Start Date End Date Taking? Authorizing Provider  acetaminophen (TYLENOL) 500 MG tablet Take 500 mg by mouth every 6 (six) hours as needed for headache (pain).    Yes [provider]  albuterol (VENTOLIN HFA) 108 (90 Base) MCG/ACT inhaler Inhale 2 puffs into the lungs every 6 (six) hours as needed for wheezing or shortness of breath. 09/18/22  Yes Icard, Rachel Bo, DO   Amino Acids-Protein Hydrolys (PRO-STAT PO) Take 30 mLs by mouth daily.   Yes [provider]  Ascorbic Acid (VITAMIN C) 100 MG tablet Take 100 mg by mouth daily.   Yes [provider]  Ascorbic Acid (VITAMIN C) 1000 MG tablet Take 1 tablet (1,000 mg total) by mouth daily for 7 days. 09/29/23 10/06/23 Yes Fargo, Amy E, NP  budesonide (PULMICORT) 0.25 MG/2ML nebulizer solution USE (0.25 MG TOTAL) BY NEBULIZATION TWICE A DAY. 02/28/21  Yes Koberlein, Junell C, MD  Calcium Carb-Cholecalciferol (CALCIUM 1000 + D PO) Take 2 tablets by mouth daily.   Yes [provider]  FERROUS SULFATE PO Take 1 tablet by mouth every morning.   Yes [provider]  hydrOXYzine (ATARAX) 25 MG tablet Take 25 mg by mouth daily. 08/27/23  Yes [provider]  hydrOXYzine (VISTARIL) 25 MG capsule Take 1 capsule (25 mg total) by mouth at bedtime. May also take 1 capsule (25 mg total) daily as needed. 08/25/23  Yes Fargo, Amy E, NP  levothyroxine (SYNTHROID) 50 MCG tablet TAKE 1 TABLET BY MOUTH EVERY DAY BEFORE BREAKFAST 05/28/23  Yes Karie Georges, MD  losartan (COZAAR) 100 MG tablet Take 1 tablet (100 mg total) by mouth daily. 05/28/23  Yes Karie Georges, MD  metoprolol succinate (TOPROL-XL) 25 MG 24 hr tablet Take 1 tablet (25 mg total) by mouth daily. 05/28/23  Yes Karie Georges, MD  Multiple Vitamins-Minerals (PRESERVISION AREDS 2+MULTI VIT) CAPS Take 1 capsule by mouth every morning.   Yes [provider]  NIFEdipine (PROCARDIA XL/NIFEDICAL-XL) 90 MG 24 hr tablet Take 1 tablet (90 mg total) by mouth daily. 07/15/23  Yes Lanae Boast, MD  nirmatrelvir/ritonavir (PAXLOVID) 20 x 150 MG & 10 x 100MG  TABS Take 3 tablets by mouth 2 (two) times daily for 5 days. (Take nirmatrelvir 150 mg two tablets twice daily for 5 days and ritonavir 100 mg one tablet twice daily for 5 days) Patient GFR is 86 09/29/23 10/04/23 Yes Fargo, Amy E, NP  OXYGEN Inhale 2 L into the lungs  continuous.   Yes [provider]  pravastatin (PRAVACHOL) 40 MG tablet Place 1 tablet (40 mg total) into feeding tube daily at 6 PM. 07/14/23  Yes Lanae Boast, MD  promethazine (PHENERGAN) 12.5 MG tablet Take 12.5 mg by mouth every 6 (six) hours as needed for nausea or vomiting.   Yes [provider]  senna-docusate (SENOKOT-S) 8.6-50 MG tablet Take 1 tablet by mouth as needed for mild constipation.   Yes [provider]  sertraline (ZOLOFT) 25 MG tablet Take 1 tablet (25 mg total) by mouth daily. 09/22/23  Yes Fargo, Amy E, NP  Tiotropium Bromide-Olodaterol 2.5-2.5 MCG/ACT AERS Inhale 2 puffs into the lungs every morning.   Yes [provider]  vitamin B-12 (CYANOCOBALAMIN) 100 MCG tablet Take 100 mcg by mouth daily.   Yes [provider]  zinc gluconate 50 MG tablet Take 1 tablet (50 mg total) by mouth daily for 7 days. 09/29/23 10/06/23 Yes Fargo, Amy E, NP  zinc oxide 20 % ointment Apply 1 Application topically as needed for irritation.   Yes [provider]     Critical care time: 94 minutes    I have discussed this patient with Dr. Madaline Brilliant, he agrees with my assessment and plan as above

## 2023-10-01 NOTE — Progress Notes (Signed)
   10/01/23 1956  BiPAP/CPAP/SIPAP  BiPAP/CPAP/SIPAP Pt Type Adult  BiPAP/CPAP/SIPAP SERVO  Mask Type Full face mask  Mask Size Medium  Set Rate 15 breaths/min  Respiratory Rate 29 breaths/min  IPAP 18 cmH20  EPAP 6 cmH2O  PEEP 6 cmH20  FiO2 (%) 40 %  Minute Ventilation 18.1  Leak 68  Peak Inspiratory Pressure (PIP) 18  Tidal Volume (Vt) 693  Patient Home Equipment No  Press High Alarm 30 cmH2O  BiPAP/CPAP /SiPAP Vitals  BP (!) 83/51  Bilateral Breath Sounds Diminished  MEWS Score/Color  MEWS Score 4  MEWS Score Color Red

## 2023-10-01 NOTE — ED Notes (Signed)
 Bridgett NP made aware lab was not able to obtain labs from left arm and is okay with sticking right arm for labs

## 2023-10-01 NOTE — Progress Notes (Signed)
 Pt not available for ABG as IV team is in room. Will attempt later

## 2023-10-01 NOTE — Progress Notes (Addendum)
 eLink Physician-Brief Progress Note Patient Name: Charlotte Harris DOB: 02/05/1932 MRN: [id number]   Date of Service  10/01/2023  HPI/Events of Note    eICU Interventions     Hypotension Levo changed to CVC dose  Dexreased urine output Most prob intyravascular volume depleted  Will give IVF     [facility] 10/01/2023, 8:13 PM

## 2023-10-01 NOTE — Progress Notes (Signed)
 Pt transported on BiPAP from ED room 28 to 4N23 without any complications.

## 2023-10-01 NOTE — ED Notes (Signed)
 Bridgett NP made aware that pt cbg was 318 an hour after receiving 7 units of insulin. She stated to go ahead and give pt another dose per the sliding scale already ordered and recheck a cbg at 0600.

## 2023-10-01 NOTE — Progress Notes (Signed)
 RT unable to get ABG after 3 attempts. MD aware.

## 2023-10-02 DIAGNOSIS — R6521 Severe sepsis with septic shock: Secondary | ICD-10-CM | POA: Diagnosis not present

## 2023-10-02 DIAGNOSIS — A419 Sepsis, unspecified organism: Secondary | ICD-10-CM | POA: Diagnosis not present

## 2023-10-02 DIAGNOSIS — J449 Chronic obstructive pulmonary disease, unspecified: Secondary | ICD-10-CM | POA: Diagnosis not present

## 2023-10-02 DIAGNOSIS — J9601 Acute respiratory failure with hypoxia: Secondary | ICD-10-CM | POA: Diagnosis not present

## 2023-10-02 LAB — GLUCOSE, CAPILLARY
Glucose-Capillary: 135 mg/dL — ABNORMAL HIGH (ref 70–99)
Glucose-Capillary: 155 mg/dL — ABNORMAL HIGH (ref 70–99)

## 2023-10-02 MED ORDER — MORPHINE 100MG IN NS 100ML (1MG/ML) PREMIX INFUSION
0.0000 mg/h | INTRAVENOUS | Status: DC
  Administered 2023-10-02: 10 mg/h via INTRAVENOUS
  Administered 2023-10-02: 5 mg/h via INTRAVENOUS
  Administered 2023-10-02: 7 mg/h via INTRAVENOUS
  Administered 2023-10-03 (×2): 20 mg/h via INTRAVENOUS
  Filled 2023-10-02 (×4): qty 100

## 2023-10-02 MED ORDER — MORPHINE BOLUS VIA INFUSION
5.0000 mg | INTRAVENOUS | Status: DC | PRN
  Administered 2023-10-02 (×2): 5 mg via INTRAVENOUS

## 2023-10-02 MED ORDER — ACETAMINOPHEN 325 MG PO TABS: 650.0000 mg | ORAL_TABLET | Freq: Four times a day (QID) | ORAL | Status: DC | PRN

## 2023-10-02 MED ORDER — GLYCOPYRROLATE 0.2 MG/ML IJ SOLN
0.2000 mg | INTRAMUSCULAR | Status: DC | PRN
  Administered 2023-10-02 – 2023-10-03 (×3): 0.2 mg via INTRAVENOUS
  Filled 2023-10-02 (×2): qty 1

## 2023-10-02 MED ORDER — LACTATED RINGERS IV SOLN: INTRAVENOUS | Status: DC

## 2023-10-02 MED ORDER — GLYCOPYRROLATE 1 MG PO TABS: 1.0000 mg | ORAL_TABLET | ORAL | Status: DC | PRN

## 2023-10-02 MED ORDER — SODIUM CHLORIDE 0.9 % IV SOLN: INTRAVENOUS | Status: AC

## 2023-10-02 MED ORDER — ACETAMINOPHEN 650 MG RE SUPP: 650.0000 mg | Freq: Four times a day (QID) | RECTAL | Status: DC | PRN

## 2023-10-02 MED ORDER — GLYCOPYRROLATE 0.2 MG/ML IJ SOLN
0.2000 mg | INTRAMUSCULAR | Status: DC | PRN
  Filled 2023-10-02: qty 1

## 2023-10-02 MED ORDER — POLYVINYL ALCOHOL 1.4 % OP SOLN: 1.0000 [drp] | Freq: Four times a day (QID) | OPHTHALMIC | Status: DC | PRN

## 2023-10-02 NOTE — Progress Notes (Signed)
 NAME:  BERTRICE LEDER, MRN:  [id number], DOB:  1932-02-17, LOS: 1 ADMISSION DATE:  09/30/2023, CONSULTATION DATE:  10/01/2023 REFERRING MD:  CHIEF COMPLAINT:  Septic Shock   History of Present Illness:  Elveta Rape is a 89 y.o. female with PMH significant for Severe COPD on 2 L home oxygen, HTN, HLD, Hemorrhagic Stroke 12/25, DM, Right upper lobe adenocarcinoma s/p radiation, and Breast cancer s/p right mastectomy, Hypothyroidism, and OA who presents to the ER today with worsening shortness of breath.  To review, according to son and chart review patient resides [facility] where she was noted to be ill starting on Saturday, tested + for COVID 2/26 and was started on Paxlovid.  The following day she was requiring increased oxygen to 4 liters, frequent nebulizers and yet remained hypoxic. EMS was called, O2 sats 80%. Upon arrival to the ER she was placed on Bipap. Labs revealed WBC 11.8, lactic acid 5.5, BUN 55 creat 1.99, GFR 24, Chloride 92, Glucose 264, AG 19, BNP 258, UA negative. CXR New left perihilar infiltration, likely pneumonia and Chronic RU lobe mass. Blood cultures sent, broad spectrum antibiotics started, Solumedrol 120 mg. Despite 2.5 Liters IVF she remained hypotensive requiring norepinephrine infusion.   Pertinent  Medical History  COPD on 2 L home oxygen HTN HLD Hemorrhagic Stroke 12/25 DM COVID +  Significant Hospital Events: Including procedures, antibiotic start and stop dates in addition to other pertinent events   2/28: Patient admitted from nursing home with Covid infection, respiratory distress, and septic shock. IVF, Cultures, antibiotics. Bipap. Norepinephrine   Interim History / Subjective:   Patient needs increased overnight.  Remains on BiPAP  Objective   Blood pressure 110/60, pulse 88, temperature 97.8 F (36.6 C), temperature source Axillary, resp. rate 15, height 5\' 2"  (1.575 m), weight 47.1 kg, SpO2 98%.    Vent Mode: PCV FiO2 (%):  [40 %] 40  % Set Rate:  [15 bmp] 15 bmp PEEP:  [6 cmH20] 6 cmH20   Intake/Output Summary (Last 24 hours) at 10/02/2023 0824 Last data filed at 10/02/2023 0800 Gross per 24 hour  Intake 2703.19 ml  Output 270 ml  Net 2433.19 ml   Filed Weights   09/30/23 1936 10/02/23 0337  Weight: 44.6 kg 47.1 kg    Resolved Hospital Problem list   None`  Examination Gen:   Moderate distress on BiPAP, chronically ill-appearing, frail HEENT:  EOMI, sclera anicteric Neck:     No masses; no thyromegaly Lungs:   Bilateral rhonchi CV:         Regular rate and rhythm; no murmurs Abd:      + bowel sounds; soft, non-tender; no palpable masses, no distension Ext:    No edema; adequate peripheral perfusion Skin:      Warm and dry; no rash Neuro: Unresponsive  Assessment & Plan:   Acute Hypoxic Hypercarbic Respiratory Failure secondary to COVID PNA and likely bacterial PNA as well based on PCT 74.  Severe COPD O2 dependent 2 liters [place] with likely acute exacerbation.  Septic Shock: echo in December with good EF Diabetes Type 2 Acute Kidney Injury, suspect due to ATN, dehydration Hypothyroidism Hyperlipidemia Essential Hypertension Anxiety  She continues to struggle and unable to come off BiPAP.  Still in septic shock with increasing pressor requirements and spite of therapy Discussed with daughter and son at bedside today.  They feel that she is in pain and do not want to put her through this anymore.  They have requested  a transition to comfort measures. Orders placed for withdrawal of care.  Best Practice (right click and "Reselect all SmartList Selections" daily)   Diet/type: NPO DVT prophylaxis prophylactic heparin , SCDs Pressure ulcer(s): present on admission  GI prophylaxis: H2B Lines: N/A - Family is Okay with a central line if needed Foley:  Yes, and it is still needed Code Status:  DNR/DNI Last date of multidisciplinary goals of care discussion [Updated son at bedside in ER]  Critical care  time:    The patient is critically ill with multiple organ system failure and requires high complexity decision making for assessment and support, frequent evaluation and titration of therapies, advanced monitoring, review of radiographic studies and interpretation of complex data.   Critical Care Time devoted to patient care services, exclusive of separately billable procedures, described in this note is 35 minutes.   Chilton Greathouse MD [facility] See Amion for pager  If no response to pager , please call [phone] until 7pm After 7:00 pm call Elink  [phone] 10/02/2023, 8:25 AM

## 2023-10-02 NOTE — Plan of Care (Signed)
 pon initial assessment patient was found to still be on levo, BP maintaining. Provider at bedside with family to discuss comfort care. Patient officially transitioned to comfort care.  ICU status maintained.   Problem: Education: Goal: Knowledge of risk factors and measures for prevention of condition will improve Outcome: Not Progressing   Problem: Coping: Goal: Psychosocial and spiritual needs will be supported Outcome: Not Progressing   Problem: Respiratory: Goal: Will maintain a patent airway Outcome: Not Progressing Goal: Complications related to the disease process, condition or treatment will be avoided or minimized Outcome: Not Progressing   Problem: Education: Goal: Ability to describe self-care measures that may prevent or decrease complications (Diabetes Survival Skills Education) will improve Outcome: Not Progressing Goal: Individualized Educational Video(s) Outcome: Not Progressing   Problem: Coping: Goal: Ability to adjust to condition or change in health will improve Outcome: Not Progressing   Problem: Fluid Volume: Goal: Ability to maintain a balanced intake and output will improve Outcome: Not Progressing   Problem: Health Behavior/Discharge Planning: Goal: Ability to identify and utilize available resources and services will improve Outcome: Not Progressing Goal: Ability to manage health-related needs will improve Outcome: Not Progressing   Problem: Metabolic: Goal: Ability to maintain appropriate glucose levels will improve Outcome: Not Progressing   Problem: Nutritional: Goal: Maintenance of adequate nutrition will improve Outcome: Not Progressing Goal: Progress toward achieving an optimal weight will improve Outcome: Not Progressing   Problem: Skin Integrity: Goal: Risk for impaired skin integrity will decrease Outcome: Not Progressing   Problem: Tissue Perfusion: Goal: Adequacy of tissue perfusion will improve Outcome: Not Progressing    Problem: Education: Goal: Knowledge of General Education information will improve Description: Including pain rating scale, medication(s)/side effects and non-pharmacologic comfort measures Outcome: Not Progressing   Problem: Health Behavior/Discharge Planning: Goal: Ability to manage health-related needs will improve Outcome: Not Progressing   Problem: Clinical Measurements: Goal: Ability to maintain clinical measurements within normal limits will improve Outcome: Not Progressing Goal: Will remain free from infection Outcome: Not Progressing Goal: Diagnostic test results will improve Outcome: Not Progressing Goal: Respiratory complications will improve Outcome: Not Progressing Goal: Cardiovascular complication will be avoided Outcome: Not Progressing   Problem: Activity: Goal: Risk for activity intolerance will decrease Outcome: Not Progressing   Problem: Nutrition: Goal: Adequate nutrition will be maintained Outcome: Not Progressing   Problem: Coping: Goal: Level of anxiety will decrease Outcome: Not Progressing   Problem: Elimination: Goal: Will not experience complications related to bowel motility Outcome: Not Progressing Goal: Will not experience complications related to urinary retention Outcome: Not Progressing   Problem: Pain Managment: Goal: General experience of comfort will improve and/or be controlled Outcome: Not Progressing   Problem: Safety: Goal: Ability to remain free from injury will improve Outcome: Not Progressing   Problem: Skin Integrity: Goal: Risk for impaired skin integrity will decrease Outcome: Not Progressing

## 2023-10-02 NOTE — Progress Notes (Signed)
 Urine output minimal at this time, 75 ml so far, CCM notified

## 2023-10-02 DEATH — deceased

## 2023-10-05 LAB — CULTURE, BLOOD (ROUTINE X 2)
Culture: NO GROWTH
Culture: NO GROWTH
Special Requests: ADEQUATE

## 2023-11-02 NOTE — Death Summary Note (Signed)
  DEATH SUMMARY   Patient Details  Name: Charlotte Harris MRN: [id number] DOB: [birth year]  Admission/Discharge Information   Admit Date:  2023-10-15  Date of Death: Date of Death: 2023-10-18  Time of Death: Time of Death: 0953  Length of Stay: 2  Referring Physician: Mahlon Gammon, MD   Reason(s) for Hospitalization  Acute Hypoxic Hypercarbic Respiratory Failure secondary to COVID PNA   Diagnoses  Preliminary cause of death:  Acute Hypoxic Hypercarbic Respiratory Failure secondary to COVID PNA  Severe COPD with COPD exacerbation Septic shock present on admission secondary to COVID-19 pneumonia Acute kidney injury secondary to ATN Hypothyroidism Hyperlipidemia Essential Hypertension Anxiety DNR, palliative care Comfort care  Secondary Diagnoses (including complications and co-morbidities):  Principal Problem:   Septic shock (HCC) Active Problems:   Pneumonia due to COVID-19 virus   Brief Hospital Course (including significant findings, care, treatment, and services provided and events leading to death)  Charlotte Harris is a 89 y.o. female with PMH significant for Severe COPD on 2 L home oxygen, HTN, HLD, Hemorrhagic Stroke 12/25, DM, Right upper lobe adenocarcinoma s/p radiation, and Breast cancer s/p right mastectomy, Hypothyroidism, and OA who presents to the ER today with worsening shortness of breath.  To review, according to son and chart review patient resides [facility] where she was noted to be ill starting on Saturday, tested + for COVID 2/26 and was started on Paxlovid.  The following day she was requiring increased oxygen to 4 liters, frequent nebulizers and yet remained hypoxic. EMS was called, O2 sats 80%. Upon arrival to the ER she was placed on Bipap. Labs revealed WBC 11.8, lactic acid 5.5, BUN 55 creat 1.99, GFR 24, Chloride 92, Glucose 264, AG 19, BNP 258, UA negative. CXR New left perihilar infiltration, likely pneumonia and Chronic RU lobe mass. Blood  cultures sent, broad spectrum antibiotics started, Solumedrol 120 mg. Despite 2.5 Liters IVF she remained hypotensive requiring norepinephrine infusion.   She continued to struggle and unable to come off BiPAP.  Still in septic shock with increasing pressor requirements and spite of therapy Discussed with daughter and son at bedside and they felt that she is in pain and do not want to put her through this anymore.  They requested a transition to comfort measures. Orders placed for withdrawal of care.  Signature:   Chilton Greathouse MD [facility] See Amion for pager  If no response to pager , please call [phone] until 7pm After 7:00 pm call Elink  [phone] 10/05/2023, 10:02 AM

## 2023-11-02 DEATH — deceased
